# Patient Record
Sex: Male | Born: 1960
Health system: Southern US, Community
[De-identification: ages and names within clinical notes are randomized; demographics above are authoritative.]

## PROBLEM LIST (undated history)

## (undated) DIAGNOSIS — T7840XA Allergy, unspecified, initial encounter: Secondary | ICD-10-CM

## (undated) DIAGNOSIS — Z21 Asymptomatic human immunodeficiency virus [HIV] infection status: Secondary | ICD-10-CM

## (undated) DIAGNOSIS — J939 Pneumothorax, unspecified: Secondary | ICD-10-CM

## (undated) DIAGNOSIS — I10 Essential (primary) hypertension: Secondary | ICD-10-CM

## (undated) DIAGNOSIS — B20 Human immunodeficiency virus [HIV] disease: Secondary | ICD-10-CM

## (undated) HISTORY — DX: Allergy, unspecified, initial encounter: T78.40XA

## (undated) HISTORY — DX: Essential (primary) hypertension: I10

## (undated) HISTORY — PX: CHEST TUBE INSERTION: SHX231

---

## 1998-09-20 ENCOUNTER — Emergency Department (HOSPITAL_COMMUNITY): Admission: EM | Admit: 1998-09-20 | Discharge: 1998-09-20 | Payer: Self-pay | Admitting: Emergency Medicine

## 2001-04-22 ENCOUNTER — Emergency Department (HOSPITAL_COMMUNITY): Admission: EM | Admit: 2001-04-22 | Discharge: 2001-04-22 | Payer: Self-pay | Admitting: Emergency Medicine

## 2001-04-22 ENCOUNTER — Encounter: Payer: Self-pay | Admitting: Emergency Medicine

## 2001-04-24 ENCOUNTER — Emergency Department (HOSPITAL_COMMUNITY): Admission: EM | Admit: 2001-04-24 | Discharge: 2001-04-24 | Payer: Self-pay | Admitting: Emergency Medicine

## 2001-09-17 ENCOUNTER — Encounter: Payer: Self-pay | Admitting: Internal Medicine

## 2001-09-17 LAB — CONVERTED CEMR LAB
HCV Ab: POSITIVE
Hep A Total Ab: NEGATIVE

## 2003-08-14 ENCOUNTER — Emergency Department (HOSPITAL_COMMUNITY): Admission: EM | Admit: 2003-08-14 | Discharge: 2003-08-14 | Payer: Self-pay | Admitting: Emergency Medicine

## 2003-11-19 ENCOUNTER — Ambulatory Visit: Payer: Self-pay | Admitting: Internal Medicine

## 2003-11-19 ENCOUNTER — Ambulatory Visit: Payer: Self-pay | Admitting: Infectious Diseases

## 2003-11-19 ENCOUNTER — Inpatient Hospital Stay (HOSPITAL_COMMUNITY): Admission: EM | Admit: 2003-11-19 | Discharge: 2003-12-16 | Payer: Self-pay | Admitting: Emergency Medicine

## 2004-01-05 ENCOUNTER — Ambulatory Visit: Payer: Self-pay | Admitting: Internal Medicine

## 2004-01-28 ENCOUNTER — Ambulatory Visit: Payer: Self-pay | Admitting: Internal Medicine

## 2004-03-19 ENCOUNTER — Inpatient Hospital Stay (HOSPITAL_COMMUNITY): Admission: EM | Admit: 2004-03-19 | Discharge: 2004-03-22 | Payer: Self-pay | Admitting: Emergency Medicine

## 2004-03-19 ENCOUNTER — Ambulatory Visit: Payer: Self-pay | Admitting: Internal Medicine

## 2004-07-20 ENCOUNTER — Emergency Department (HOSPITAL_COMMUNITY): Admission: EM | Admit: 2004-07-20 | Discharge: 2004-07-20 | Payer: Self-pay | Admitting: Emergency Medicine

## 2004-08-02 ENCOUNTER — Ambulatory Visit: Payer: Self-pay | Admitting: Internal Medicine

## 2004-09-02 ENCOUNTER — Ambulatory Visit: Payer: Self-pay | Admitting: Internal Medicine

## 2004-09-04 ENCOUNTER — Emergency Department (HOSPITAL_COMMUNITY): Admission: EM | Admit: 2004-09-04 | Discharge: 2004-09-04 | Payer: Self-pay | Admitting: *Deleted

## 2004-09-10 ENCOUNTER — Ambulatory Visit: Payer: Self-pay | Admitting: Internal Medicine

## 2004-11-04 ENCOUNTER — Ambulatory Visit: Payer: Self-pay | Admitting: Internal Medicine

## 2004-11-15 ENCOUNTER — Ambulatory Visit: Payer: Self-pay | Admitting: Internal Medicine

## 2004-11-24 ENCOUNTER — Ambulatory Visit: Payer: Self-pay | Admitting: Family Medicine

## 2004-12-02 ENCOUNTER — Ambulatory Visit: Payer: Self-pay | Admitting: Internal Medicine

## 2004-12-16 ENCOUNTER — Ambulatory Visit: Payer: Self-pay | Admitting: Internal Medicine

## 2005-01-06 ENCOUNTER — Ambulatory Visit (HOSPITAL_COMMUNITY): Admission: RE | Admit: 2005-01-06 | Discharge: 2005-01-06 | Payer: Self-pay | Admitting: Internal Medicine

## 2005-01-06 ENCOUNTER — Ambulatory Visit: Payer: Self-pay | Admitting: Internal Medicine

## 2005-01-07 ENCOUNTER — Ambulatory Visit: Payer: Self-pay | Admitting: Internal Medicine

## 2005-01-20 ENCOUNTER — Ambulatory Visit: Payer: Self-pay | Admitting: Internal Medicine

## 2005-02-03 ENCOUNTER — Ambulatory Visit: Payer: Self-pay | Admitting: Internal Medicine

## 2005-03-03 ENCOUNTER — Ambulatory Visit: Payer: Self-pay | Admitting: Internal Medicine

## 2005-03-09 ENCOUNTER — Encounter: Payer: Self-pay | Admitting: Internal Medicine

## 2005-03-09 LAB — CONVERTED CEMR LAB: Hep B S Ab: POSITIVE

## 2005-03-16 ENCOUNTER — Ambulatory Visit: Payer: Self-pay | Admitting: Internal Medicine

## 2005-05-12 ENCOUNTER — Ambulatory Visit: Payer: Self-pay | Admitting: Internal Medicine

## 2005-08-11 ENCOUNTER — Ambulatory Visit: Payer: Self-pay | Admitting: Internal Medicine

## 2005-09-13 ENCOUNTER — Ambulatory Visit: Payer: Self-pay | Admitting: Internal Medicine

## 2005-09-15 ENCOUNTER — Ambulatory Visit: Payer: Self-pay | Admitting: Internal Medicine

## 2005-10-13 ENCOUNTER — Ambulatory Visit: Payer: Self-pay | Admitting: Internal Medicine

## 2005-11-03 ENCOUNTER — Ambulatory Visit: Payer: Self-pay | Admitting: Internal Medicine

## 2005-12-01 ENCOUNTER — Ambulatory Visit: Payer: Self-pay | Admitting: Internal Medicine

## 2006-01-28 ENCOUNTER — Emergency Department (HOSPITAL_COMMUNITY): Admission: EM | Admit: 2006-01-28 | Discharge: 2006-01-28 | Payer: Self-pay | Admitting: Emergency Medicine

## 2006-02-02 ENCOUNTER — Ambulatory Visit: Payer: Self-pay | Admitting: Internal Medicine

## 2006-04-27 ENCOUNTER — Ambulatory Visit: Payer: Self-pay | Admitting: Internal Medicine

## 2006-05-27 ENCOUNTER — Emergency Department (HOSPITAL_COMMUNITY): Admission: EM | Admit: 2006-05-27 | Discharge: 2006-05-27 | Payer: Self-pay | Admitting: Emergency Medicine

## 2006-07-10 DIAGNOSIS — B20 Human immunodeficiency virus [HIV] disease: Secondary | ICD-10-CM | POA: Insufficient documentation

## 2006-07-10 DIAGNOSIS — M545 Low back pain, unspecified: Secondary | ICD-10-CM | POA: Insufficient documentation

## 2006-07-10 DIAGNOSIS — B59 Pneumocystosis: Secondary | ICD-10-CM

## 2006-07-10 DIAGNOSIS — B182 Chronic viral hepatitis C: Secondary | ICD-10-CM | POA: Insufficient documentation

## 2006-07-10 DIAGNOSIS — J309 Allergic rhinitis, unspecified: Secondary | ICD-10-CM | POA: Insufficient documentation

## 2006-07-10 DIAGNOSIS — F1021 Alcohol dependence, in remission: Secondary | ICD-10-CM

## 2006-07-10 DIAGNOSIS — F1020 Alcohol dependence, uncomplicated: Secondary | ICD-10-CM | POA: Insufficient documentation

## 2006-07-10 HISTORY — DX: Chronic viral hepatitis C: B18.2

## 2006-07-10 HISTORY — DX: Pneumocystosis: B59

## 2006-07-24 ENCOUNTER — Emergency Department (HOSPITAL_COMMUNITY): Admission: EM | Admit: 2006-07-24 | Discharge: 2006-07-24 | Payer: Self-pay | Admitting: Emergency Medicine

## 2006-07-31 ENCOUNTER — Ambulatory Visit: Payer: Self-pay | Admitting: Internal Medicine

## 2006-08-17 ENCOUNTER — Ambulatory Visit: Payer: Self-pay | Admitting: Internal Medicine

## 2006-08-21 ENCOUNTER — Other Ambulatory Visit: Payer: Self-pay | Admitting: Emergency Medicine

## 2006-08-21 ENCOUNTER — Ambulatory Visit: Payer: Self-pay | Admitting: Psychiatry

## 2006-08-21 ENCOUNTER — Inpatient Hospital Stay (HOSPITAL_COMMUNITY): Admission: RE | Admit: 2006-08-21 | Discharge: 2006-08-28 | Payer: Self-pay | Admitting: Psychiatry

## 2006-10-30 ENCOUNTER — Ambulatory Visit: Payer: Self-pay | Admitting: Internal Medicine

## 2006-10-30 LAB — CONVERTED CEMR LAB
ALT: 107 units/L — ABNORMAL HIGH (ref 0–53)
AST: 60 units/L — ABNORMAL HIGH (ref 0–37)
Albumin: 4.2 g/dL (ref 3.5–5.2)
Alkaline Phosphatase: 72 units/L (ref 39–117)
BUN: 11 mg/dL (ref 6–23)
Basophils Absolute: 0 10*3/uL (ref 0.0–0.1)
Basophils Relative: 0 % (ref 0–1)
CO2: 26 meq/L (ref 19–32)
Calcium: 8.6 mg/dL (ref 8.4–10.5)
Chloride: 107 meq/L (ref 96–112)
Creatinine, Ser: 0.85 mg/dL (ref 0.40–1.50)
Eosinophils Absolute: 0.2 10*3/uL (ref 0.0–0.7)
Eosinophils Relative: 3 % (ref 0–5)
Glucose, Bld: 54 mg/dL — ABNORMAL LOW (ref 70–99)
HCT: 42.3 % (ref 39.0–52.0)
HIV 1 RNA Quant: <50 copies/mL (ref <50)
HIV-1 RNA Quant, Log: <1.7 (ref <1.70)
Hemoglobin: 13.7 g/dL (ref 13.0–17.0)
Lymphocytes Relative: 28 % (ref 12–46)
Lymphs Abs: 1.7 10*3/uL (ref 0.7–3.3)
MCHC: 32.4 g/dL (ref 30.0–36.0)
MCV: 90.2 fL (ref 78.0–100.0)
Monocytes Absolute: 0.6 10*3/uL (ref 0.2–0.7)
Monocytes Relative: 10 % (ref 3–11)
Neutro Abs: 3.7 10*3/uL (ref 1.7–7.7)
Neutrophils Relative %: 60 % (ref 43–77)
Platelets: 259 10*3/uL (ref 150–400)
Potassium: 4.1 meq/L (ref 3.5–5.3)
RBC: 4.69 M/uL (ref 4.22–5.81)
RDW: 14.8 % — ABNORMAL HIGH (ref 11.5–14.0)
Sodium: 141 meq/L (ref 135–145)
Total Bilirubin: 0.4 mg/dL (ref 0.3–1.2)
Total Protein: 7.8 g/dL (ref 6.0–8.3)
WBC: 6.1 10*3/uL (ref 4.0–10.5)

## 2006-11-16 ENCOUNTER — Emergency Department (HOSPITAL_COMMUNITY): Admission: EM | Admit: 2006-11-16 | Discharge: 2006-11-16 | Payer: Self-pay | Admitting: Emergency Medicine

## 2006-11-28 ENCOUNTER — Ambulatory Visit: Payer: Self-pay | Admitting: Internal Medicine

## 2006-11-30 ENCOUNTER — Ambulatory Visit: Payer: Self-pay | Admitting: Internal Medicine

## 2006-12-21 ENCOUNTER — Ambulatory Visit: Payer: Self-pay | Admitting: Internal Medicine

## 2007-02-15 ENCOUNTER — Ambulatory Visit: Payer: Self-pay | Admitting: Internal Medicine

## 2007-02-15 LAB — CONVERTED CEMR LAB
ALT: 63 units/L — ABNORMAL HIGH (ref 0–53)
AST: 60 units/L — ABNORMAL HIGH (ref 0–37)
Absolute CD4: 356 #/uL — ABNORMAL LOW (ref 381–1469)
Albumin: 4.2 g/dL (ref 3.5–5.2)
Alkaline Phosphatase: 75 units/L (ref 39–117)
BUN: 9 mg/dL (ref 6–23)
Basophils Absolute: 0.1 10*3/uL (ref 0.0–0.1)
Basophils Relative: 1 % (ref 0–1)
CD4 T Helper %: 21 % — ABNORMAL LOW (ref 32–62)
CO2: 25 meq/L (ref 19–32)
Calcium: 9 mg/dL (ref 8.4–10.5)
Chloride: 106 meq/L (ref 96–112)
Creatinine, Ser: 0.92 mg/dL (ref 0.40–1.50)
Eosinophils Absolute: 0.1 10*3/uL (ref 0.0–0.7)
Eosinophils Relative: 1 % (ref 0–5)
Glucose, Bld: 66 mg/dL — ABNORMAL LOW (ref 70–99)
HCT: 40.1 % (ref 39.0–52.0)
HIV 1 RNA Quant: 58 copies/mL — ABNORMAL HIGH (ref <50)
HIV-1 RNA Quant, Log: 1.76 — ABNORMAL HIGH (ref <1.70)
Hemoglobin: 13 g/dL (ref 13.0–17.0)
Lymphocytes Relative: 32 % (ref 12–46)
Lymphs Abs: 1.7 10*3/uL (ref 0.7–4.0)
MCHC: 32.4 g/dL (ref 30.0–36.0)
MCV: 89.5 fL (ref 78.0–100.0)
Monocytes Absolute: 0.6 10*3/uL (ref 0.1–1.0)
Monocytes Relative: 11 % (ref 3–12)
Neutro Abs: 2.9 10*3/uL (ref 1.7–7.7)
Neutrophils Relative %: 55 % (ref 43–77)
Platelets: 281 10*3/uL (ref 150–400)
Potassium: 4.4 meq/L (ref 3.5–5.3)
RBC: 4.48 M/uL (ref 4.22–5.81)
RDW: 15 % (ref 11.5–15.5)
Sodium: 144 meq/L (ref 135–145)
Total Bilirubin: 0.6 mg/dL (ref 0.3–1.2)
Total Lymphocytes %: 32 % (ref 12–46)
Total Protein: 8.1 g/dL (ref 6.0–8.3)
Total lymphocyte count: 1696 cells/mcL (ref 700–3300)
WBC, lymph enumeration: 5.3 10*3/uL (ref 4.0–10.5)
WBC: 5.3 10*3/uL (ref 4.0–10.5)

## 2007-03-01 ENCOUNTER — Ambulatory Visit: Payer: Self-pay | Admitting: Internal Medicine

## 2007-03-29 ENCOUNTER — Ambulatory Visit: Payer: Self-pay | Admitting: Internal Medicine

## 2007-06-28 ENCOUNTER — Ambulatory Visit: Payer: Self-pay | Admitting: Internal Medicine

## 2007-07-30 ENCOUNTER — Ambulatory Visit: Payer: Self-pay | Admitting: Internal Medicine

## 2007-07-31 ENCOUNTER — Encounter: Payer: Self-pay | Admitting: Internal Medicine

## 2007-07-31 LAB — CONVERTED CEMR LAB
HIV 1 RNA Quant: 5670 copies/mL — ABNORMAL HIGH (ref <50)
HIV-1 RNA Quant, Log: 3.75 — ABNORMAL HIGH (ref <1.70)

## 2007-08-23 ENCOUNTER — Ambulatory Visit: Payer: Self-pay | Admitting: Internal Medicine

## 2007-09-24 ENCOUNTER — Ambulatory Visit: Payer: Self-pay | Admitting: Internal Medicine

## 2007-09-24 ENCOUNTER — Encounter: Payer: Self-pay | Admitting: Internal Medicine

## 2007-09-24 LAB — CONVERTED CEMR LAB
ALT: 56 units/L — ABNORMAL HIGH (ref 0–53)
AST: 97 units/L — ABNORMAL HIGH (ref 0–37)
Absolute CD4: 154 #/uL — ABNORMAL LOW (ref 381–1469)
Albumin: 3.9 g/dL (ref 3.5–5.2)
Alkaline Phosphatase: 66 units/L (ref 39–117)
BUN: 9 mg/dL (ref 6–23)
Basophils Absolute: 0.1 10*3/uL (ref 0.0–0.1)
Basophils Relative: 1 % (ref 0–1)
CD4 T Helper %: 14 % — ABNORMAL LOW (ref 32–62)
CO2: 26 meq/L (ref 19–32)
Calcium: 9.2 mg/dL (ref 8.4–10.5)
Chloride: 102 meq/L (ref 96–112)
Creatinine, Ser: 0.89 mg/dL (ref 0.40–1.50)
Eosinophils Absolute: 0.3 10*3/uL (ref 0.0–0.7)
Eosinophils Relative: 7 % — ABNORMAL HIGH (ref 0–5)
Glucose, Bld: 92 mg/dL (ref 70–99)
HCT: 40.2 % (ref 39.0–52.0)
HIV 1 RNA Quant: 745 copies/mL — ABNORMAL HIGH (ref <50)
HIV-1 RNA Quant, Log: 2.87 — ABNORMAL HIGH (ref <1.70)
Hemoglobin: 13.1 g/dL (ref 13.0–17.0)
Lymphocytes Relative: 25 % (ref 12–46)
Lymphs Abs: 1.1 10*3/uL (ref 0.7–4.0)
MCHC: 32.6 g/dL (ref 30.0–36.0)
MCV: 85.9 fL (ref 78.0–100.0)
Monocytes Absolute: 0.8 10*3/uL (ref 0.1–1.0)
Monocytes Relative: 17 % — ABNORMAL HIGH (ref 3–12)
Neutro Abs: 2.2 10*3/uL (ref 1.7–7.7)
Neutrophils Relative %: 50 % (ref 43–77)
Platelets: 249 10*3/uL (ref 150–400)
Potassium: 3.9 meq/L (ref 3.5–5.3)
RBC: 4.68 M/uL (ref 4.22–5.81)
RDW: 15.3 % (ref 11.5–15.5)
Sodium: 137 meq/L (ref 135–145)
Total Bilirubin: 0.6 mg/dL (ref 0.3–1.2)
Total Lymphocytes %: 25 % (ref 12–46)
Total Protein: 8.1 g/dL (ref 6.0–8.3)
Total lymphocyte count: 1100 cells/mcL (ref 700–3300)
WBC, lymph enumeration: 4.4 10*3/uL (ref 4.0–10.5)
WBC: 4.4 10*3/uL (ref 4.0–10.5)

## 2007-10-25 ENCOUNTER — Ambulatory Visit: Payer: Self-pay | Admitting: Internal Medicine

## 2007-11-09 ENCOUNTER — Emergency Department (HOSPITAL_COMMUNITY): Admission: EM | Admit: 2007-11-09 | Discharge: 2007-11-10 | Payer: Self-pay | Admitting: *Deleted

## 2007-11-22 ENCOUNTER — Ambulatory Visit: Payer: Self-pay | Admitting: Internal Medicine

## 2007-12-20 ENCOUNTER — Ambulatory Visit: Payer: Self-pay | Admitting: Internal Medicine

## 2007-12-20 LAB — CONVERTED CEMR LAB
ALT: 67 units/L — ABNORMAL HIGH (ref 0–53)
AST: 102 units/L — ABNORMAL HIGH (ref 0–37)
Absolute CD4: 103 #/uL — ABNORMAL LOW (ref 381–1469)
Albumin: 3.7 g/dL (ref 3.5–5.2)
Alkaline Phosphatase: 61 units/L (ref 39–117)
BUN: 13 mg/dL (ref 6–23)
Basophils Absolute: 0 10*3/uL (ref 0.0–0.1)
Basophils Relative: 1 % (ref 0–1)
CD4 T Helper %: 8 % — ABNORMAL LOW (ref 32–62)
CO2: 24 meq/L (ref 19–32)
Calcium: 8.6 mg/dL (ref 8.4–10.5)
Chloride: 108 meq/L (ref 96–112)
Creatinine, Ser: 0.85 mg/dL (ref 0.40–1.50)
Eosinophils Absolute: 0.1 10*3/uL (ref 0.0–0.7)
Eosinophils Relative: 3 % (ref 0–5)
Glucose, Bld: 80 mg/dL (ref 70–99)
HCT: 40.7 % (ref 39.0–52.0)
HIV 1 RNA Quant: 7630 copies/mL — ABNORMAL HIGH (ref <48)
HIV-1 RNA Quant, Log: 3.88 — ABNORMAL HIGH (ref <1.68)
Hemoglobin: 13.2 g/dL (ref 13.0–17.0)
Lymphocytes Relative: 33 % (ref 12–46)
Lymphs Abs: 1.3 10*3/uL (ref 0.7–4.0)
MCHC: 32.4 g/dL (ref 30.0–36.0)
MCV: 85.1 fL (ref 78.0–100.0)
Monocytes Absolute: 0.6 10*3/uL (ref 0.1–1.0)
Monocytes Relative: 16 % — ABNORMAL HIGH (ref 3–12)
Neutro Abs: 1.9 10*3/uL (ref 1.7–7.7)
Neutrophils Relative %: 48 % (ref 43–77)
Platelets: 193 10*3/uL (ref 150–400)
Potassium: 3.9 meq/L (ref 3.5–5.3)
RBC: 4.78 M/uL (ref 4.22–5.81)
RDW: 14.4 % (ref 11.5–15.5)
Sodium: 142 meq/L (ref 135–145)
Total Bilirubin: 0.6 mg/dL (ref 0.3–1.2)
Total Lymphocytes %: 33 % (ref 12–46)
Total Protein: 7.4 g/dL (ref 6.0–8.3)
Total lymphocyte count: 1287 cells/mcL (ref 700–3300)
WBC, lymph enumeration: 3.9 10*3/uL — ABNORMAL LOW (ref 4.0–10.5)
WBC: 3.9 10*3/uL — ABNORMAL LOW (ref 4.0–10.5)

## 2008-01-17 ENCOUNTER — Ambulatory Visit: Payer: Self-pay | Admitting: Internal Medicine

## 2008-01-17 LAB — CONVERTED CEMR LAB
HIV 1 RNA Quant: 1030 copies/mL — ABNORMAL HIGH (ref <48)
HIV-1 RNA Quant, Log: 3.01 — ABNORMAL HIGH (ref <1.68)

## 2008-02-04 ENCOUNTER — Ambulatory Visit: Payer: Self-pay | Admitting: Internal Medicine

## 2008-05-08 ENCOUNTER — Ambulatory Visit: Payer: Self-pay | Admitting: Internal Medicine

## 2008-05-08 ENCOUNTER — Encounter: Payer: Self-pay | Admitting: Internal Medicine

## 2008-05-08 LAB — CONVERTED CEMR LAB
ALT: 117 units/L — ABNORMAL HIGH (ref 0–53)
AST: 97 units/L — ABNORMAL HIGH (ref 0–37)
Absolute CD4: 200 #/uL — ABNORMAL LOW (ref 381–1469)
Albumin: 4.1 g/dL (ref 3.5–5.2)
Alkaline Phosphatase: 72 units/L (ref 39–117)
BUN: 17 mg/dL (ref 6–23)
Basophils Absolute: 0.1 10*3/uL (ref 0.0–0.1)
Basophils Relative: 1 % (ref 0–1)
CD4 Count: 200 microliters
CD4 T Helper %: 13 % — ABNORMAL LOW (ref 32–62)
CO2: 24 meq/L (ref 19–32)
Calcium: 8.7 mg/dL (ref 8.4–10.5)
Chloride: 106 meq/L (ref 96–112)
Cholesterol: 160 mg/dL (ref 0–200)
Creatinine, Ser: 0.82 mg/dL (ref 0.40–1.50)
Eosinophils Absolute: 0 10*3/uL (ref 0.0–0.7)
Eosinophils Relative: 1 % (ref 0–5)
Glucose, Bld: 73 mg/dL (ref 70–99)
HCT: 40.8 % (ref 39.0–52.0)
HDL: 50 mg/dL (ref >39)
HIV 1 RNA Quant: 3920 copies/mL — ABNORMAL HIGH (ref <48)
HIV-1 RNA Quant, Log: 3.59 — ABNORMAL HIGH (ref <1.68)
Hemoglobin: 13.3 g/dL (ref 13.0–17.0)
LDL Cholesterol: 93 mg/dL (ref 0–99)
Lymphocytes Relative: 32 % (ref 12–46)
Lymphs Abs: 1.6 10*3/uL (ref 0.7–4.0)
MCHC: 32.6 g/dL (ref 30.0–36.0)
MCV: 87.2 fL (ref 78.0–100.0)
Monocytes Absolute: 0.8 10*3/uL (ref 0.1–1.0)
Monocytes Relative: 17 % — ABNORMAL HIGH (ref 3–12)
Neutro Abs: 2.4 10*3/uL (ref 1.7–7.7)
Neutrophils Relative %: 49 % (ref 43–77)
Platelets: 227 10*3/uL (ref 150–400)
Potassium: 4.1 meq/L (ref 3.5–5.3)
RBC: 4.68 M/uL (ref 4.22–5.81)
RDW: 14.4 % (ref 11.5–15.5)
Sodium: 142 meq/L (ref 135–145)
Total Bilirubin: 0.6 mg/dL (ref 0.3–1.2)
Total CHOL/HDL Ratio: 3.2
Total Lymphocytes %: 32 % (ref 12–46)
Total Protein: 8.4 g/dL — ABNORMAL HIGH (ref 6.0–8.3)
Total lymphocyte count: 1536 cells/mcL (ref 700–3300)
Triglycerides: 85 mg/dL (ref <150)
VLDL: 17 mg/dL (ref 0–40)
WBC, lymph enumeration: 4.8 10*3/uL (ref 4.0–10.5)
WBC: 4.8 10*3/uL (ref 4.0–10.5)

## 2008-05-22 ENCOUNTER — Encounter: Payer: Self-pay | Admitting: Internal Medicine

## 2008-05-22 ENCOUNTER — Ambulatory Visit: Payer: Self-pay | Admitting: Internal Medicine

## 2008-06-24 ENCOUNTER — Encounter: Payer: Self-pay | Admitting: Internal Medicine

## 2008-07-14 ENCOUNTER — Encounter: Payer: Self-pay | Admitting: Internal Medicine

## 2008-07-14 ENCOUNTER — Ambulatory Visit: Payer: Self-pay | Admitting: Internal Medicine

## 2008-07-14 LAB — CONVERTED CEMR LAB
ALT: 97 units/L — ABNORMAL HIGH (ref 0–53)
AST: 97 units/L — ABNORMAL HIGH (ref 0–37)
Absolute CD4: 135 #/uL — ABNORMAL LOW (ref 381–1469)
Albumin: 4.3 g/dL (ref 3.5–5.2)
Alkaline Phosphatase: 67 units/L (ref 39–117)
BUN: 9 mg/dL (ref 6–23)
Basophils Absolute: 0.1 10*3/uL (ref 0.0–0.1)
Basophils Relative: 1 % (ref 0–1)
CD4 T Helper %: 10 % — ABNORMAL LOW (ref 32–62)
CO2: 25 meq/L (ref 19–32)
Calcium: 9.6 mg/dL (ref 8.4–10.5)
Chloride: 105 meq/L (ref 96–112)
Creatinine, Ser: 0.88 mg/dL (ref 0.40–1.50)
Eosinophils Absolute: 0.1 10*3/uL (ref 0.0–0.7)
Eosinophils Relative: 2 % (ref 0–5)
Glucose, Bld: 114 mg/dL — ABNORMAL HIGH (ref 70–99)
HCT: 40 % (ref 39.0–52.0)
HIV 1 RNA Quant: 177 copies/mL — ABNORMAL HIGH (ref <48)
HIV-1 RNA Quant, Log: 2.25 — ABNORMAL HIGH (ref <1.68)
Hemoglobin: 13.4 g/dL (ref 13.0–17.0)
Lymphocytes Relative: 27 % (ref 12–46)
Lymphs Abs: 1.4 10*3/uL (ref 0.7–4.0)
MCHC: 33.5 g/dL (ref 30.0–36.0)
MCV: 86.2 fL (ref 78.0–100.0)
Monocytes Absolute: 0.6 10*3/uL (ref 0.1–1.0)
Monocytes Relative: 13 % — ABNORMAL HIGH (ref 3–12)
Neutro Abs: 2.8 10*3/uL (ref 1.7–7.7)
Neutrophils Relative %: 57 % (ref 43–77)
Platelets: 244 10*3/uL (ref 150–400)
Potassium: 4.6 meq/L (ref 3.5–5.3)
RBC: 4.64 M/uL (ref 4.22–5.81)
RDW: 15.3 % (ref 11.5–15.5)
Sodium: 143 meq/L (ref 135–145)
Total Bilirubin: 0.4 mg/dL (ref 0.3–1.2)
Total Lymphocytes %: 27 % (ref 12–46)
Total Protein: 8.8 g/dL — ABNORMAL HIGH (ref 6.0–8.3)
Total lymphocyte count: 1350 cells/mcL (ref 700–3300)
WBC, lymph enumeration: 5 10*3/uL (ref 4.0–10.5)
WBC: 5 10*3/uL (ref 4.0–10.5)

## 2008-07-28 ENCOUNTER — Encounter: Payer: Self-pay | Admitting: Internal Medicine

## 2008-07-28 ENCOUNTER — Ambulatory Visit: Payer: Self-pay | Admitting: Internal Medicine

## 2008-08-20 ENCOUNTER — Emergency Department (HOSPITAL_COMMUNITY): Admission: EM | Admit: 2008-08-20 | Discharge: 2008-08-20 | Payer: Self-pay | Admitting: Emergency Medicine

## 2008-09-30 ENCOUNTER — Ambulatory Visit: Payer: Self-pay | Admitting: Thoracic Surgery

## 2008-09-30 ENCOUNTER — Inpatient Hospital Stay (HOSPITAL_COMMUNITY): Admission: EM | Admit: 2008-09-30 | Discharge: 2008-10-13 | Payer: Self-pay | Admitting: Emergency Medicine

## 2008-10-01 ENCOUNTER — Ambulatory Visit: Payer: Self-pay | Admitting: Internal Medicine

## 2008-10-16 ENCOUNTER — Encounter: Payer: Self-pay | Admitting: Internal Medicine

## 2008-10-16 ENCOUNTER — Ambulatory Visit: Payer: Self-pay | Admitting: Internal Medicine

## 2008-10-16 DIAGNOSIS — J930 Spontaneous tension pneumothorax: Secondary | ICD-10-CM | POA: Insufficient documentation

## 2008-10-16 DIAGNOSIS — J939 Pneumothorax, unspecified: Secondary | ICD-10-CM | POA: Insufficient documentation

## 2008-10-16 LAB — CONVERTED CEMR LAB
ALT: 27 units/L (ref 0–53)
AST: 32 units/L (ref 0–37)
Absolute CD4: 129 #/uL — ABNORMAL LOW (ref 381–1469)
Albumin: 3.6 g/dL (ref 3.5–5.2)
Alkaline Phosphatase: 59 units/L (ref 39–117)
BUN: 13 mg/dL (ref 6–23)
Basophils Absolute: 0.1 10*3/uL (ref 0.0–0.1)
Basophils Relative: 2 % — ABNORMAL HIGH (ref 0–1)
CD4 T Helper %: 10 % — ABNORMAL LOW (ref 32–62)
CO2: 23 meq/L (ref 19–32)
Calcium: 8.5 mg/dL (ref 8.4–10.5)
Chloride: 101 meq/L (ref 96–112)
Creatinine, Ser: 0.78 mg/dL (ref 0.40–1.50)
Eosinophils Absolute: 0.3 10*3/uL (ref 0.0–0.7)
Eosinophils Relative: 5 % (ref 0–5)
Glucose, Bld: 84 mg/dL (ref 70–99)
HCT: 31.5 % — ABNORMAL LOW (ref 39.0–52.0)
HIV 1 RNA Quant: 24000 copies/mL — ABNORMAL HIGH (ref <48)
HIV-1 RNA Quant, Log: 4.38 — ABNORMAL HIGH (ref <1.68)
Hemoglobin: 9.7 g/dL — ABNORMAL LOW (ref 13.0–17.0)
Lymphocytes Relative: 23 % (ref 12–46)
Lymphs Abs: 1.3 10*3/uL (ref 0.7–4.0)
MCHC: 30.8 g/dL (ref 30.0–36.0)
MCV: 83.8 fL (ref 78.0–100.0)
Monocytes Absolute: 1.1 10*3/uL — ABNORMAL HIGH (ref 0.1–1.0)
Monocytes Relative: 19 % — ABNORMAL HIGH (ref 3–12)
Neutro Abs: 2.9 10*3/uL (ref 1.7–7.7)
Neutrophils Relative %: 51 % (ref 43–77)
Platelets: 513 10*3/uL — ABNORMAL HIGH (ref 150–400)
Potassium: 4.5 meq/L (ref 3.5–5.3)
RBC: 3.76 M/uL — ABNORMAL LOW (ref 4.22–5.81)
RDW: 13.9 % (ref 11.5–15.5)
Sodium: 136 meq/L (ref 135–145)
Total Bilirubin: 0.4 mg/dL (ref 0.3–1.2)
Total Protein: 7.7 g/dL (ref 6.0–8.3)
Total lymphocyte count: 1288 cells/mcL (ref 700–3300)
WBC: 5.6 10*3/uL (ref 4.0–10.5)

## 2008-10-20 ENCOUNTER — Encounter: Payer: Self-pay | Admitting: Internal Medicine

## 2008-10-21 ENCOUNTER — Encounter: Payer: Self-pay | Admitting: Internal Medicine

## 2008-10-21 ENCOUNTER — Ambulatory Visit: Payer: Self-pay | Admitting: Surgery

## 2008-10-21 ENCOUNTER — Encounter: Admission: RE | Admit: 2008-10-21 | Discharge: 2008-10-21 | Payer: Self-pay | Admitting: Thoracic Surgery

## 2008-10-21 LAB — CONVERTED CEMR LAB

## 2008-10-30 ENCOUNTER — Encounter: Payer: Self-pay | Admitting: Internal Medicine

## 2008-10-30 ENCOUNTER — Ambulatory Visit: Payer: Self-pay | Admitting: Internal Medicine

## 2008-11-13 ENCOUNTER — Encounter: Payer: Self-pay | Admitting: Internal Medicine

## 2008-11-13 ENCOUNTER — Ambulatory Visit: Payer: Self-pay | Admitting: Internal Medicine

## 2008-11-17 ENCOUNTER — Telehealth: Payer: Self-pay | Admitting: Internal Medicine

## 2008-12-15 ENCOUNTER — Ambulatory Visit: Payer: Self-pay | Admitting: Surgery

## 2008-12-29 ENCOUNTER — Encounter: Payer: Self-pay | Admitting: Internal Medicine

## 2008-12-29 ENCOUNTER — Ambulatory Visit: Payer: Self-pay | Admitting: Internal Medicine

## 2008-12-29 LAB — CONVERTED CEMR LAB
ALT: 70 units/L — ABNORMAL HIGH (ref 0–53)
AST: 52 units/L — ABNORMAL HIGH (ref 0–37)
Absolute CD4: 295 #/uL — ABNORMAL LOW (ref 381–1469)
Albumin: 4.4 g/dL (ref 3.5–5.2)
Alkaline Phosphatase: 67 units/L (ref 39–117)
BUN: 16 mg/dL (ref 6–23)
Basophils Absolute: 0.1 10*3/uL (ref 0.0–0.1)
Basophils Relative: 1 % (ref 0–1)
CD4 T Helper %: 11 % — ABNORMAL LOW (ref 32–62)
CO2: 22 meq/L (ref 19–32)
Calcium: 9.4 mg/dL (ref 8.4–10.5)
Chloride: 107 meq/L (ref 96–112)
Creatinine, Ser: 0.82 mg/dL (ref 0.40–1.50)
Eosinophils Absolute: 0.4 10*3/uL (ref 0.0–0.7)
Eosinophils Relative: 7 % — ABNORMAL HIGH (ref 0–5)
Glucose, Bld: 84 mg/dL (ref 70–99)
HCT: 36.4 % — ABNORMAL LOW (ref 39.0–52.0)
HIV 1 RNA Quant: <48 copies/mL (ref <48)
HIV-1 RNA Quant, Log: <1.68 (ref <1.68)
Hemoglobin: 11.4 g/dL — ABNORMAL LOW (ref 13.0–17.0)
Lymphocytes Relative: 47 % — ABNORMAL HIGH (ref 12–46)
Lymphs Abs: 2.7 10*3/uL (ref 0.7–4.0)
MCHC: 31.3 g/dL (ref 30.0–36.0)
MCV: 79.6 fL (ref 78.0–100.0)
Monocytes Absolute: 0.6 10*3/uL (ref 0.1–1.0)
Monocytes Relative: 11 % (ref 3–12)
Neutro Abs: 2 10*3/uL (ref 1.7–7.7)
Neutrophils Relative %: 35 % — ABNORMAL LOW (ref 43–77)
Platelets: 290 10*3/uL (ref 150–400)
Potassium: 4.6 meq/L (ref 3.5–5.3)
RBC: 4.57 M/uL (ref 4.22–5.81)
RDW: 16.7 % — ABNORMAL HIGH (ref 11.5–15.5)
Sodium: 140 meq/L (ref 135–145)
Total Bilirubin: 0.2 mg/dL — ABNORMAL LOW (ref 0.3–1.2)
Total Protein: 8.4 g/dL — ABNORMAL HIGH (ref 6.0–8.3)
Total lymphocyte count: 2679 cells/mcL (ref 700–3300)
WBC: 5.7 10*3/uL (ref 4.0–10.5)

## 2009-01-12 ENCOUNTER — Encounter: Payer: Self-pay | Admitting: Internal Medicine

## 2009-01-12 ENCOUNTER — Ambulatory Visit: Payer: Self-pay | Admitting: Internal Medicine

## 2009-02-10 ENCOUNTER — Encounter: Admission: RE | Admit: 2009-02-10 | Discharge: 2009-02-10 | Payer: Self-pay | Admitting: Surgery

## 2009-02-10 ENCOUNTER — Ambulatory Visit: Payer: Self-pay | Admitting: Surgery

## 2009-05-07 ENCOUNTER — Ambulatory Visit: Payer: Self-pay | Admitting: Internal Medicine

## 2009-05-07 ENCOUNTER — Encounter: Payer: Self-pay | Admitting: Internal Medicine

## 2009-05-07 LAB — CONVERTED CEMR LAB
ALT: 63 units/L — ABNORMAL HIGH (ref 0–53)
AST: 65 units/L — ABNORMAL HIGH (ref 0–37)
Absolute CD4: 104 #/uL — ABNORMAL LOW (ref 381–1469)
Albumin: 4.3 g/dL (ref 3.5–5.2)
Alkaline Phosphatase: 58 units/L (ref 39–117)
BUN: 10 mg/dL (ref 6–23)
Basophils Absolute: 0 10*3/uL (ref 0.0–0.1)
Basophils Relative: 1 % (ref 0–1)
CD4 T Helper %: 10 % — ABNORMAL LOW (ref 32–62)
CO2: 25 meq/L (ref 19–32)
Calcium: 9 mg/dL (ref 8.4–10.5)
Chloride: 104 meq/L (ref 96–112)
Creatinine, Ser: 0.87 mg/dL (ref 0.40–1.50)
Eosinophils Absolute: 0.1 10*3/uL (ref 0.0–0.7)
Eosinophils Relative: 3 % (ref 0–5)
Glucose, Bld: 87 mg/dL (ref 70–99)
HCT: 41.3 % (ref 39.0–52.0)
HIV 1 RNA Quant: 20400 copies/mL — ABNORMAL HIGH (ref <48)
HIV-1 RNA Quant, Log: 4.31 — ABNORMAL HIGH (ref <1.68)
Hemoglobin: 12.9 g/dL — ABNORMAL LOW (ref 13.0–17.0)
Lymphocytes Relative: 36 % (ref 12–46)
Lymphs Abs: 1 10*3/uL (ref 0.7–4.0)
MCHC: 31.2 g/dL (ref 30.0–36.0)
MCV: 83.1 fL (ref 78.0–100.0)
Monocytes Absolute: 0.5 10*3/uL (ref 0.1–1.0)
Monocytes Relative: 18 % — ABNORMAL HIGH (ref 3–12)
Neutro Abs: 1.2 10*3/uL — ABNORMAL LOW (ref 1.7–7.7)
Neutrophils Relative %: 42 % — ABNORMAL LOW (ref 43–77)
Platelets: 197 10*3/uL (ref 150–400)
Potassium: 4.2 meq/L (ref 3.5–5.3)
RBC: 4.97 M/uL (ref 4.22–5.81)
RDW: 16.2 % — ABNORMAL HIGH (ref 11.5–15.5)
Sodium: 139 meq/L (ref 135–145)
Total Bilirubin: 0.4 mg/dL (ref 0.3–1.2)
Total Protein: 8.5 g/dL — ABNORMAL HIGH (ref 6.0–8.3)
Total lymphocyte count: 1044 cells/mcL (ref 700–3300)
WBC: 2.9 10*3/uL — ABNORMAL LOW (ref 4.0–10.5)

## 2009-06-10 ENCOUNTER — Emergency Department (HOSPITAL_COMMUNITY): Admission: EM | Admit: 2009-06-10 | Discharge: 2009-06-10 | Payer: Self-pay | Admitting: Emergency Medicine

## 2009-06-18 ENCOUNTER — Emergency Department (HOSPITAL_COMMUNITY): Admission: EM | Admit: 2009-06-18 | Discharge: 2009-06-18 | Payer: Self-pay | Admitting: Emergency Medicine

## 2009-06-23 ENCOUNTER — Ambulatory Visit: Payer: Self-pay | Admitting: Internal Medicine

## 2009-09-02 ENCOUNTER — Encounter: Payer: Self-pay | Admitting: Internal Medicine

## 2009-10-15 ENCOUNTER — Ambulatory Visit: Payer: Self-pay | Admitting: Internal Medicine

## 2009-10-15 LAB — CONVERTED CEMR LAB
ALT: 150 units/L — ABNORMAL HIGH (ref 0–53)
AST: 159 units/L — ABNORMAL HIGH (ref 0–37)
Albumin: 3.9 g/dL (ref 3.5–5.2)
Alkaline Phosphatase: 48 units/L (ref 39–117)
BUN: 15 mg/dL (ref 6–23)
Basophils Absolute: 0 10*3/uL (ref 0.0–0.1)
Basophils Relative: 1 % (ref 0–1)
CO2: 27 meq/L (ref 19–32)
Calcium: 8.5 mg/dL (ref 8.4–10.5)
Chloride: 105 meq/L (ref 96–112)
Cholesterol: 135 mg/dL (ref 0–200)
Creatinine, Ser: 0.94 mg/dL (ref 0.40–1.50)
Eosinophils Absolute: 0.3 10*3/uL (ref 0.0–0.7)
Eosinophils Relative: 9 % — ABNORMAL HIGH (ref 0–5)
Glucose, Bld: 70 mg/dL (ref 70–99)
HCT: 39.6 % (ref 39.0–52.0)
HDL: 44 mg/dL (ref >39)
HIV 1 RNA Quant: <20 copies/mL (ref <48)
HIV-1 RNA Quant, Log: <1.3 (ref <1.68)
Hemoglobin: 13 g/dL (ref 13.0–17.0)
Hepatitis B Surface Ag: NEGATIVE
LDL Cholesterol: 58 mg/dL (ref 0–99)
Lymphocytes Relative: 39 % (ref 12–46)
Lymphs Abs: 1.5 10*3/uL (ref 0.7–4.0)
MCHC: 32.8 g/dL (ref 30.0–36.0)
MCV: 85.9 fL (ref 78.0–100.0)
Monocytes Absolute: 0.6 10*3/uL (ref 0.1–1.0)
Monocytes Relative: 14 % — ABNORMAL HIGH (ref 3–12)
Neutro Abs: 1.5 10*3/uL — ABNORMAL LOW (ref 1.7–7.7)
Neutrophils Relative %: 38 % — ABNORMAL LOW (ref 43–77)
Platelets: 280 10*3/uL (ref 150–400)
Potassium: 4.4 meq/L (ref 3.5–5.3)
RBC: 4.61 M/uL (ref 4.22–5.81)
RDW: 15.3 % (ref 11.5–15.5)
Sodium: 140 meq/L (ref 135–145)
Total Bilirubin: 0.3 mg/dL (ref 0.3–1.2)
Total CHOL/HDL Ratio: 3.1
Total Protein: 7 g/dL (ref 6.0–8.3)
Triglycerides: 166 mg/dL — ABNORMAL HIGH (ref <150)
VLDL: 33 mg/dL (ref 0–40)
WBC: 3.9 10*3/uL — ABNORMAL LOW (ref 4.0–10.5)

## 2009-10-28 ENCOUNTER — Ambulatory Visit: Payer: Self-pay | Admitting: Internal Medicine

## 2010-01-21 ENCOUNTER — Ambulatory Visit: Payer: Self-pay | Admitting: Internal Medicine

## 2010-01-21 ENCOUNTER — Encounter: Payer: Self-pay | Admitting: Internal Medicine

## 2010-01-21 LAB — CONVERTED CEMR LAB
ALT: 169 units/L — ABNORMAL HIGH (ref 0–53)
AST: 214 units/L — ABNORMAL HIGH (ref 0–37)
Albumin: 4.2 g/dL (ref 3.5–5.2)
Alkaline Phosphatase: 49 units/L (ref 39–117)
BUN: 13 mg/dL (ref 6–23)
Basophils Absolute: 0.1 10*3/uL (ref 0.0–0.1)
Basophils Relative: 2 % — ABNORMAL HIGH (ref 0–1)
CO2: 30 meq/L (ref 19–32)
Calcium: 8.9 mg/dL (ref 8.4–10.5)
Chloride: 105 meq/L (ref 96–112)
Creatinine, Ser: 0.8 mg/dL (ref 0.40–1.50)
Eosinophils Absolute: 0.1 10*3/uL (ref 0.0–0.7)
Eosinophils Relative: 4 % (ref 0–5)
Glucose, Bld: 81 mg/dL (ref 70–99)
HCT: 39.2 % (ref 39.0–52.0)
HIV 1 RNA Quant: <20 copies/mL (ref <20)
HIV-1 RNA Quant, Log: <1.3 (ref <1.30)
Hemoglobin: 13.2 g/dL (ref 13.0–17.0)
Lymphocytes Relative: 42 % (ref 12–46)
Lymphs Abs: 1.6 10*3/uL (ref 0.7–4.0)
MCHC: 33.7 g/dL (ref 30.0–36.0)
MCV: 86.5 fL (ref 78.0–100.0)
Monocytes Absolute: 0.6 10*3/uL (ref 0.1–1.0)
Monocytes Relative: 16 % — ABNORMAL HIGH (ref 3–12)
Neutro Abs: 1.4 10*3/uL — ABNORMAL LOW (ref 1.7–7.7)
Neutrophils Relative %: 36 % — ABNORMAL LOW (ref 43–77)
Platelets: 250 10*3/uL (ref 150–400)
Potassium: 4.6 meq/L (ref 3.5–5.3)
RBC: 4.53 M/uL (ref 4.22–5.81)
RDW: 13.8 % (ref 11.5–15.5)
Sodium: 141 meq/L (ref 135–145)
Total Bilirubin: 0.7 mg/dL (ref 0.3–1.2)
Total Protein: 7.8 g/dL (ref 6.0–8.3)
WBC: 3.8 10*3/uL — ABNORMAL LOW (ref 4.0–10.5)

## 2010-02-16 ENCOUNTER — Ambulatory Visit
Admission: RE | Admit: 2010-02-16 | Discharge: 2010-02-16 | Payer: Self-pay | Source: Home / Self Care | Attending: Internal Medicine | Admitting: Internal Medicine

## 2010-02-16 DIAGNOSIS — R748 Abnormal levels of other serum enzymes: Secondary | ICD-10-CM | POA: Insufficient documentation

## 2010-02-16 LAB — TB SKIN TEST
Induration: 0
TB Skin Test: NEGATIVE mm

## 2010-02-18 ENCOUNTER — Ambulatory Visit (HOSPITAL_COMMUNITY)
Admission: RE | Admit: 2010-02-18 | Discharge: 2010-02-18 | Payer: Self-pay | Source: Home / Self Care | Attending: Internal Medicine | Admitting: Internal Medicine

## 2010-02-19 ENCOUNTER — Encounter (INDEPENDENT_AMBULATORY_CARE_PROVIDER_SITE_OTHER): Payer: Self-pay | Admitting: *Deleted

## 2010-02-19 LAB — TB SKIN TEST: TB Skin Test: NEGATIVE mm

## 2010-02-28 ENCOUNTER — Encounter: Payer: Self-pay | Admitting: Surgery

## 2010-02-28 ENCOUNTER — Encounter: Payer: Self-pay | Admitting: Internal Medicine

## 2010-03-01 ENCOUNTER — Encounter: Payer: Self-pay | Admitting: Thoracic Surgery

## 2010-03-09 NOTE — Miscellaneous (Signed)
Summary: Office Visit ([facility])    Vital Signs:  Patient profile:   50 year old male Weight:      154 pounds Temp:     97.5 degrees F oral Pulse rate:   58 / minute Pulse rhythm:   regular Resp:     18 per minute BP sitting:   119 / 80  (left arm)  Vitals Entered By: Sharen Heck RN (May 07, 2009 9:40 AM) CC: f/u 05/ reports rash on right arm and left thigh for a few months that itches Is Patient Diabetic? No Pain Assessment Patient in pain? no       Does patient need assistance? Functional Status Self care Ambulation Normal   CC:  f/u 05/ reports rash on right arm and left thigh for a few months that itches.  History of Present Illness: Pt c/o pruritic rash on his right arm. He states that he has been taking his meds every day.  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 Take 1 Tablet By Mouth Once A Day 2)  Bactrim Ds 800-160 Mg Tabs (Sulfamethoxazole-Trimethoprim) .... Take 1 Tablet By Mouth Once A Day 3)  Ensure  Liqd (Nutritional Supplements) .... One Can Two Times A Day 4)  Isentress 400 Mg Tabs (Raltegravir Potassium) .... Take 1 Tablet By Mouth Two Times A Day 5)  Nicoderm Cq 7 Mg/24hr Pt24 (Nicotine) .... Apply One Patch A Day 6)  Triamcinolone Acetonide 0.1 % Crea (Triamcinolone Acetonide) .... Apply Two Times A  Day  Allergies (verified): No Known Drug Allergies   Review of Systems  The patient denies anorexia, fever, and weight loss.     Physical Exam  General:  alert, well-developed, well-nourished, and well-hydrated.   Head:  normocephalic and atraumatic.   Mouth:  pharynx pink and moist.  no thrush  Lungs:  normal breath sounds.   Skin:  papular rash on upper right arm    Impression & Recommendations:  Problem # 1:  HIV DISEASE (ICD-042) Will obtain labs today and have pt f/u in 2 weeks. 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 Apply two times a day  Other Orders: T-CD4SP [facility]) (CD4SP) T-HIV Viral Load [phone]) T-Comprehensive Metabolic Panel [phone]) T-CBC w/Diff ([id number]) Est. Patient Level III (57846)    Patient Instructions: 1)  Please schedule a follow-up appointment in 2 weeks. Prescriptions: TRIAMCINOLONE ACETONIDE 0.1 % CREA (TRIAMCINOLONE ACETONIDE) apply two times a day  #30gm x 1  Entered and Authorized by:   Yisroel Ramming MD   Signed by:   Yisroel Ramming MD on 05/07/2009   Method used:   Print then Give to Patient   RxID:   [id number] NICODERM CQ 7 MG/24HR PT24 (NICOTINE) apply one patch a day  #30 x 1   Entered and Authorized by:   Yisroel Ramming MD   Signed by:   Yisroel Ramming MD on 05/07/2009   Method used:   Print then Give to Patient   RxID:   [id number]

## 2010-03-09 NOTE — Miscellaneous (Signed)
Summary: RW Update  Clinical Lists Changes  Observations: Added new observation of DATE1STVISIT: 06/23/2009 (09/02/2009 12:25) Added new observation of RWPARTICIP: Yes (09/02/2009 12:25)

## 2010-03-09 NOTE — Assessment & Plan Note (Signed)
Summary: checkup [mkj]   CC:  follow-up visitk lab results and needs RX for Ensure.  History of Present Illness: Pt here for f/u.  He has been taking his meds every day.  Preventive Screening-Counseling & Management  Alcohol-Tobacco     Alcohol drinks/day: 0     Smoking Status: current     Packs/Day: <0.25  Caffeine-Diet-Exercise     Caffeine use/day: 0     Does Patient Exercise: yes     Type of exercise: steps     Exercise (avg: min/session): >60     Times/week: 5  Safety-Violence-Falls     Seat Belt Use: yes      Sexual History:  currently monogamous.    Comments: pt. given condoms   Updated Prior Medication List: TRUVADA 200-300 MG TABS (EMTRICITABINE-TENOFOVIR) Take 1 tablet by mouth once a day BACTRIM DS 800-160 MG TABS (SULFAMETHOXAZOLE-TRIMETHOPRIM) Take 1 tablet by mouth once a day ENSURE  LIQD (NUTRITIONAL SUPPLEMENTS) one can two times a day ISENTRESS 400 MG TABS (RALTEGRAVIR POTASSIUM) Take 1 tablet by mouth two times a day NICODERM CQ 7 MG/24HR PT24 (NICOTINE) apply one patch a day TRIAMCINOLONE ACETONIDE 0.1 % CREA (TRIAMCINOLONE ACETONIDE) apply two times a day  Current Allergies (reviewed today): No known allergies  Past History:  Past Medical History: Last updated: 07/10/2006 Allergic rhinitis Hepatitis C HIV disease Low back pain  Review of Systems  The patient denies anorexia, fever, and weight loss.    Vital Signs:  Patient profile:   50 year old male Height:      72 inches (182.88 cm) Weight:      148.8 pounds (67.64 kg) BMI:     20.25 Temp:     98.0 degrees F (36.67 degrees C) oral Pulse rate:   62 / minute BP sitting:   119 / 73  (right arm)  Vitals Entered By: Wendall Mola CMA Duncan Dull) (October 28, 2009 3:30 PM) CC: follow-up visitk lab results, needs RX for Ensure Is Patient Diabetic? No Pain Assessment Patient in pain? no      Nutritional Status BMI of 19 -24 = normal Nutritional Status Detail appetite  "ok"  Does patient need assistance? Functional Status Self care Ambulation Normal Comments no missed doses of meds per pt.   Physical Exam  General:  alert, well-developed, well-nourished, and well-hydrated.   Head:  normocephalic and atraumatic.   Mouth:  pharynx pink and moist.   Lungs:  normal breath sounds.      Impression & Recommendations:  Problem # 1:  HIV DISEASE (ICD-042) Pt.s most recent CD4ct was 130 and VL <20 .  Pt instructed to continue the current antiretroviral regimen.  Pt encouraged to take medication regularly and not miss doses.  Pt will f/u in 3 months for repeat blood work and will see me 2 weeks later.  Diagnostics Reviewed:  HIV: CDC-defined AIDS (05/08/2008)   CD4: 130 (10/16/2009)   WBC: 3.9 (10/15/2009)   Hgb: 13.0 (10/15/2009)   HCT: 39.6 (10/15/2009)   Platelets: 280 (10/15/2009) HIV genotype: See Comment (10/21/2008)   HIV-1 RNA: <20 copies/mL (10/15/2009)   HBSAg: NEG (10/15/2009)  Other Orders: Est. Patient Level III (04540) Future Orders: T-CD4SP ([facility]) (CD4SP) ... 01/26/2010 T-HIV Viral Load [phone]) ... 01/26/2010 T-Comprehensive Metabolic Panel [phone]) ... 01/26/2010 T-CBC w/Diff ([id number]) ... 01/26/2010  Patient Instructions: 1)  Please schedule a follow-up appointment in 3 months, 2 weeks after labs  Prescriptions: ENSURE  LIQD (NUTRITIONAL SUPPLEMENTS) one can two times a day  #  2 cases x prn   Entered and Authorized by:   Yisroel Ramming MD   Signed by:   Yisroel Ramming MD on 10/28/2009   Method used:   Print then Give to Patient   RxID:   [id number]   Appended Document: Orders Update    Clinical Lists Changes  Orders: Added new Service order of Influenza Vaccine MCR [phone]) - Signed Added new Service order of TB Skin Test [phone]) - Signed Added new Service order of Admin 1st Vaccine (10272) - Signed Observations: Added new observation of TB-PPD LOT#: [id number] (10/28/2009 16:46) Added new observation  of TB-PPD EXP: 12/10/2010 (10/28/2009 16:46) Added new observation of TB-PPD BY: Wendall Mola CMA ( AAMA) (10/28/2009 16:46) Added new observation of TB-PPD RTE: ID (10/28/2009 16:46) Added new observation of TB-PPD DSE: 0.1 ml (10/28/2009 16:46) Added new observation of TB-PPD MFR: Sanofi Pasteur (10/28/2009 16:46) Added new observation of TB-PPD SITE: right forearm (10/28/2009 16:46) Added new observation of TB-PPD: PPD (10/28/2009 16:46) Added new observation of FLU VAX#1VIS: 09/01/09 version given October 28, 2009. (10/28/2009 16:46) Added new observation of FLU VAXLOT: 1103 3P (10/28/2009 16:46) Added new observation of FLU VAX EXP: 05/09/2010 (10/28/2009 16:46) Added new observation of FLU VAXBY: Wendall Mola CMA ( AAMA) (10/28/2009 16:46) Added new observation of FLU VAXRTE: IM (10/28/2009 16:46) Added new observation of FLU VAX DSE: 0.5 ml (10/28/2009 16:46) Added new observation of FLU VAXMFR: Novartis (10/28/2009 16:46) Added new observation of FLU VAX SITE: right deltoid (10/28/2009 16:46) Added new observation of FLU VAX: Fluvax MCR (10/28/2009 16:46)       Immunizations Administered:  Influenza Vaccine # 1:    Vaccine Type: Fluvax MCR    Site: right deltoid    Mfr: Novartis    Dose: 0.5 ml    Route: IM    Given by: Wendall Mola CMA ( AAMA)    Exp. Date: 05/09/2010    Lot #: [id number]    VIS given: 09/01/09 version given October 28, 2009.  PPD Skin Test:    Vaccine Type: PPD    Site: right forearm    Mfr: Sanofi Pasteur    Dose: 0.1 ml    Route: ID    Given by: Wendall Mola CMA ( AAMA)    Exp. Date: 12/10/2010    Lot #: [id number]  Flu Vaccine Consent Questions:    Do you have a history of severe allergic reactions to this vaccine? no    Any prior history of allergic reactions to egg and/or gelatin? no    Do you have a sensitivity to the preservative Thimersol? no    Do you have a past history of Guillan-Barre Syndrome? no     Do you currently have an acute febrile illness? no    Have you ever had a severe reaction to latex? no    Vaccine information given and explained to patient? yes

## 2010-03-09 NOTE — Assessment & Plan Note (Signed)
Summary: 2WK F/U/VS   CC:  follow-up visit, needs refills, and has been out of all meds x 2 weeks.  History of Present Illness: Pt missed a few weeks of his meds.  He needs refills on his meds. No new problems.  Preventive Screening-Counseling & Management  Alcohol-Tobacco     Alcohol drinks/day: 0     Smoking Status: current     Packs/Day: <0.25  Caffeine-Diet-Exercise     Caffeine use/day: 0     Does Patient Exercise: yes     Type of exercise: steps     Exercise (avg: min/session): >60     Times/week: 5  Safety-Violence-Falls     Seat Belt Use: yes      Sexual History:  currently monogamous.    Comments: pt. declined condoms   Updated Prior Medication List: TRUVADA 200-300 MG TABS (EMTRICITABINE-TENOFOVIR) Take 1 tablet by mouth once a day BACTRIM DS 800-160 MG TABS (SULFAMETHOXAZOLE-TRIMETHOPRIM) Take 1 tablet by mouth once a day ENSURE  LIQD (NUTRITIONAL SUPPLEMENTS) one can two times a day ISENTRESS 400 MG TABS (RALTEGRAVIR POTASSIUM) Take 1 tablet by mouth two times a day NICODERM CQ 7 MG/24HR PT24 (NICOTINE) apply one patch a day TRIAMCINOLONE ACETONIDE 0.1 % CREA (TRIAMCINOLONE ACETONIDE) apply two times a day  Current Allergies (reviewed today): No known allergies  Past History:  Past Medical History: Last updated: 07/10/2006 Allergic rhinitis Hepatitis C HIV disease Low back pain  Social History: Sexual History:  currently monogamous  Review of Systems  The patient denies anorexia, fever, and weight loss.    Vital Signs:  Patient profile:   50 year old male Height:      72 inches (182.88 cm) Weight:      149.4 pounds (67.91 kg) BMI:     20.34 Temp:     98.4 degrees F (36.89 degrees C) oral Pulse rate:   71 / minute BP sitting:   119 / 73  (right arm)  Vitals Entered By: Wendall Mola CMA Duncan Dull) (Jun 23, 2009 2:49 PM) CC: follow-up visit, needs refills, has been out of all meds x 2 weeks Is Patient Diabetic? No Pain  Assessment Patient in pain? no      Nutritional Status BMI of 19 -24 = normal Nutritional Status Detail appetite "good"  Does patient need assistance? Functional Status Self care Ambulation Normal   Physical Exam  General:  alert, well-developed, well-nourished, and well-hydrated.   Head:  normocephalic and atraumatic.   Mouth:  pharynx pink and moist.  no thrush  Lungs:  normal breath sounds.      Impression & Recommendations:  Problem # 1:  HIV DISEASE (ICD-042) Viral load elevated. Discussed the need to take his meds every day. He will return in 3 months for repeat labs. If VL remains up will obtain a genotype. Diagnostics Reviewed:  HIV: CDC-defined AIDS (05/08/2008)   CD4: 200 (05/08/2008)   WBC: 2.9 (05/07/2009)   Hgb: 12.9 (05/07/2009)   HCT: 41.3 (05/07/2009)   Platelets: 197 (05/07/2009) HIV genotype: See Comment (10/21/2008)   HIV-1 RNA: 20400 (05/07/2009)     Future Orders: T-Hepatitis B Surface Antigen ([id number]) ... 09/21/2009  Other Orders: Est. Patient Level III (63875) Future Orders: T-CD4SP ([facility]) (CD4SP) ... 09/21/2009 T-HIV Viral Load [phone]) ... 09/21/2009 T-Comprehensive Metabolic Panel [id number]) ... 09/21/2009 T-CBC w/Diff ([id number]) ... 09/21/2009 T-Lipid Profile [id number]) ... 09/21/2009  Patient Instructions: 1)  Please schedule a follow-up appointment in 3 months,2 weeks after labs.  Prescriptions: NICODERM CQ 7  MG/24HR PT24 (NICOTINE) apply one patch a day  #30 x 1   Entered and Authorized by:   Yisroel Ramming MD   Signed by:   Yisroel Ramming MD on 06/23/2009   Method used:   Print then Give to Patient   RxID:   [id number] ISENTRESS 400 MG TABS (RALTEGRAVIR POTASSIUM) Take 1 tablet by mouth two times a day  #60 x 5   Entered and Authorized by:   Yisroel Ramming MD   Signed by:   Yisroel Ramming MD on 06/23/2009   Method used:   Print then Give to Patient   RxID:   [id number] BACTRIM DS 800-160 MG TABS  (SULFAMETHOXAZOLE-TRIMETHOPRIM) Take 1 tablet by mouth once a day  #30 x 5   Entered and Authorized by:   Yisroel Ramming MD   Signed by:   Yisroel Ramming MD on 06/23/2009   Method used:   Print then Give to Patient   RxID:   [id number] TRUVADA 200-300 MG TABS (EMTRICITABINE-TENOFOVIR) Take 1 tablet by mouth once a day  #30 x 5   Entered and Authorized by:   Yisroel Ramming MD   Signed by:   Yisroel Ramming MD on 06/23/2009   Method used:   Print then Give to Patient   RxID:   [id number]

## 2010-03-11 NOTE — Miscellaneous (Signed)
Summary: PPD results  Clinical Lists Changes  Observations: Added new observation of TB PPDRESULT: negative (02/19/2010 14:10) Added new observation of PPD RESULT: < 5mm (02/19/2010 14:10) Added new observation of TB-PPD RDDTE: 02/19/2010 (02/19/2010 14:10)      PPD Results    Date of reading: 02/19/2010    Results: < 5mm    Interpretation: negative

## 2010-03-11 NOTE — Assessment & Plan Note (Signed)
Summary: F/U/VS   CC:  follow-up visit, lab results, and pt. needs refill all meds.  History of Present Illness: patient is here for two follow-up on his lab results.  He's been taking his medications every day.  He has no new complaints.  He does need refills on his HIV medications. Pt denies abdominal pain, nausea or vomiting.  Preventive Screening-Counseling & Management  Alcohol-Tobacco     Alcohol drinks/day: 0     Smoking Status: current     Packs/Day: <0.25  Caffeine-Diet-Exercise     Caffeine use/day: 0     Does Patient Exercise: yes     Type of exercise: steps     Exercise (avg: min/session): >60     Times/week: 5  Safety-Violence-Falls     Seat Belt Use: yes      Sexual History:  currently monogamous.    Comments: pt. given condoms   Updated Prior Medication List: TRUVADA 200-300 MG TABS (EMTRICITABINE-TENOFOVIR) Take 1 tablet by mouth once a day BACTRIM DS 800-160 MG TABS (SULFAMETHOXAZOLE-TRIMETHOPRIM) Take 1 tablet by mouth once a day ENSURE  LIQD (NUTRITIONAL SUPPLEMENTS) one can two times a day ISENTRESS 400 MG TABS (RALTEGRAVIR POTASSIUM) Take 1 tablet by mouth two times a day NICODERM CQ 7 MG/24HR PT24 (NICOTINE) apply one patch a day TRIAMCINOLONE ACETONIDE 0.1 % CREA (TRIAMCINOLONE ACETONIDE) apply two times a day  Current Allergies (reviewed today): No known allergies  Past History:  Past Medical History: Last updated: 07/10/2006 Allergic rhinitis Hepatitis C HIV disease Low back pain  Review of Systems  The patient denies anorexia, fever, and weight loss.    Vital Signs:  Patient profile:   50 year old male Height:      72 inches (182.88 cm) Weight:      158.4 pounds (72 kg) BMI:     21.56 Temp:     98.4 degrees F (36.89 degrees C) oral Pulse rate:   67 / minute BP sitting:   107 / 68  (right arm)  Vitals Entered By: Wendall Mola CMA Duncan Dull) (February 16, 2010 3:05 PM) CC: follow-up visit, lab results, pt. needs  refill all meds Is Patient Diabetic? No Pain Assessment Patient in pain? no      Nutritional Status BMI of 19 -24 = normal Nutritional Status Detail appetite "good"  Have you ever been in a relationship where you felt threatened, hurt or afraid?No   Does patient need assistance? Functional Status Self care Ambulation Normal Comments no missed doses of meds per pt.   Physical Exam  General:  alert, well-developed, well-nourished, and well-hydrated.   Head:  normocephalic and atraumatic.   Mouth:  pharynx pink and moist.  no thrush  Abdomen:  soft, non-tender, and no distention.     Impression & Recommendations:  Problem # 1:  HIV DISEASE (ICD-042) Pt.s most recent CD4ct was 160 and VL <20 .  Pt instructed to continue the current antiretroviral regimen.  Pt encouraged to take medication regularly and not miss doses.  Pt will f/u in 3 months for repeat blood work and will see me 2 weeks later. Pneumovax given. His updated medication list for this problem includes:    Bactrim Ds 800-160 Mg Tabs (Sulfamethoxazole-trimethoprim) .Marland Kitchen... Take 1 tablet by mouth once a day  Diagnostics Reviewed:  HIV: CDC-defined AIDS (05/08/2008)   CD4: 160 (01/22/2010)   WBC: 3.8 (01/21/2010)   Hgb: 13.2 (01/21/2010)   HCT: 39.2 (01/21/2010)   Platelets: 250 (01/21/2010) HIV genotype: See Comment (  10/21/2008)   HIV-1 RNA: <20 copies/mL (01/21/2010)   HBSAg: NEG (10/15/2009)  Problem # 2:  OTHER NONSPECIFIC ABNORMAL SERUM ENZYME LEVELS (ICD-790.5) elevated LFTs pt denies ETOH intake will obtain an ultrasound Orders: Ultrasound (Ultrasound)  Other Orders: Est. Patient Level III (04540) Pneumococcal Vaccine (98119) Admin 1st Vaccine (14782) TB Skin Test (95621) Admin of Any Addtl Vaccine (30865) Future Orders: T-CD4SP ([facility]) (CD4SP) ... 05/17/2010 T-HIV Viral Load [phone]) ... 05/17/2010 T-Comprehensive Metabolic Panel [phone]) ... 05/17/2010 T-CBC w/Diff ([id number]) ...  05/17/2010 T-RPR (Syphilis) [phone]) ... 05/17/2010  Patient Instructions: 1)  Please schedule a follow-up appointment in 3 months, 2 weeks after labs.  Prescriptions: TRUVADA 200-300 MG TABS (EMTRICITABINE-TENOFOVIR) Take 1 tablet by mouth once a day  #30 x 5   Entered and Authorized by:   Yisroel Ramming MD   Signed by:   Yisroel Ramming MD on 02/16/2010   Method used:   Print then Give to Patient   RxID:   [id number] ISENTRESS 400 MG TABS (RALTEGRAVIR POTASSIUM) Take 1 tablet by mouth two times a day  #60 x 5   Entered and Authorized by:   Yisroel Ramming MD   Signed by:   Yisroel Ramming MD on 02/16/2010   Method used:   Print then Give to Patient   RxID:   [id number]       Immunizations Administered:  Pneumonia Vaccine:    Vaccine Type: Pneumovax    Site: right deltoid    Mfr: Merck    Dose: 0.5 ml    Route: IM    Given by: Wendall Mola CMA ( AAMA)    Exp. Date: 06/17/2011    Lot #: [id number]    VIS given: 01/12/09 version given February 16, 2010.  PPD Skin Test:    Vaccine Type: PPD    Site: right forearm    Mfr: Sanofi Pasteur    Dose: 0.1 ml    Route: ID    Given by: Wendall Mola CMA ( AAMA)    Exp. Date: 08/14/2011    Lot #: [id number]

## 2010-03-31 IMAGING — CR DG CHEST 2V
2 series · 2 of 2 positions shown · non-contrast
Comparison: 10/13/2008 and 10/12/2008.  CT of 10/05/2008.

CLINICAL DATA: Follow up of right-sided pneumothorax.  Status post
chest tube removal.

CHEST - 2 VIEW

[w chest pa]
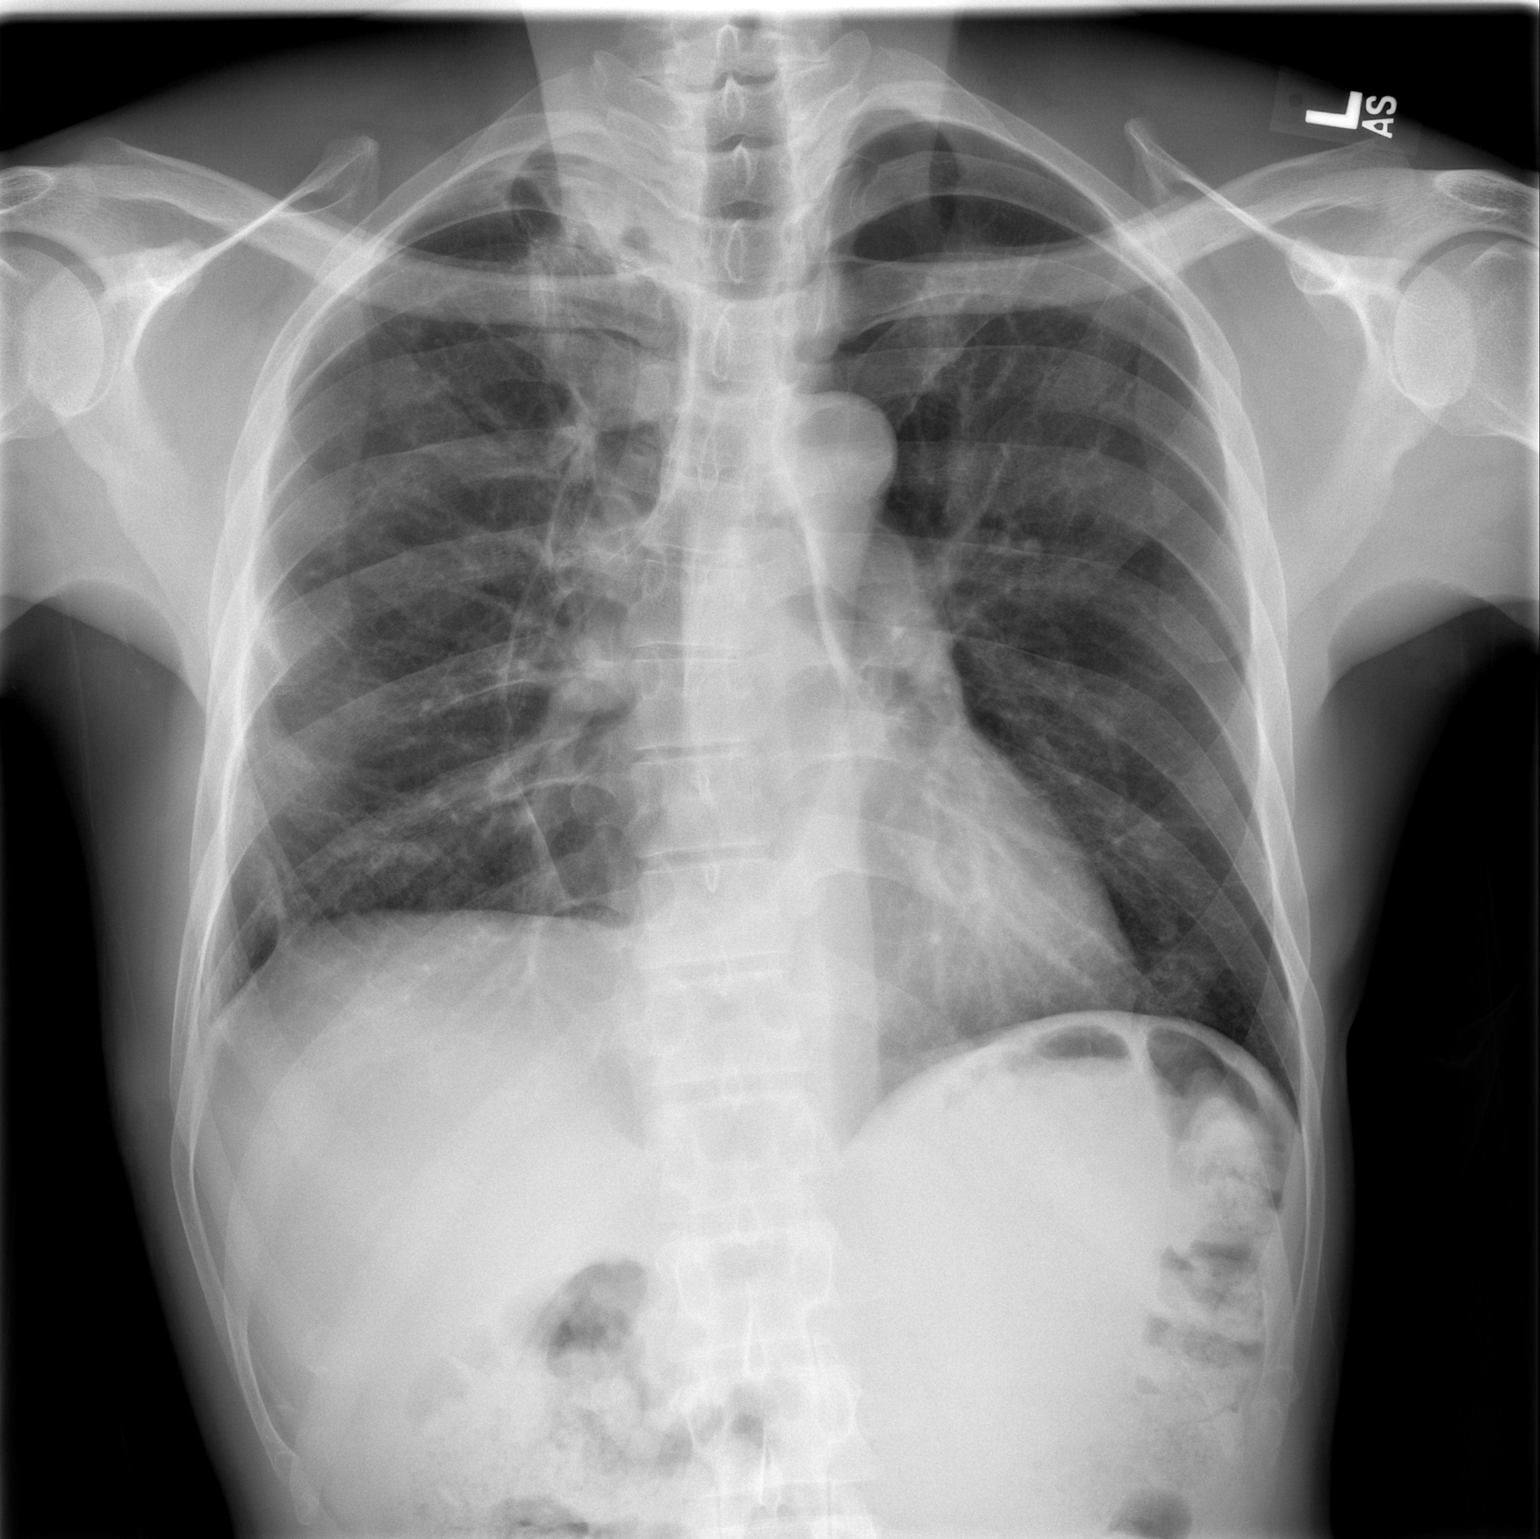

[w chest lat]
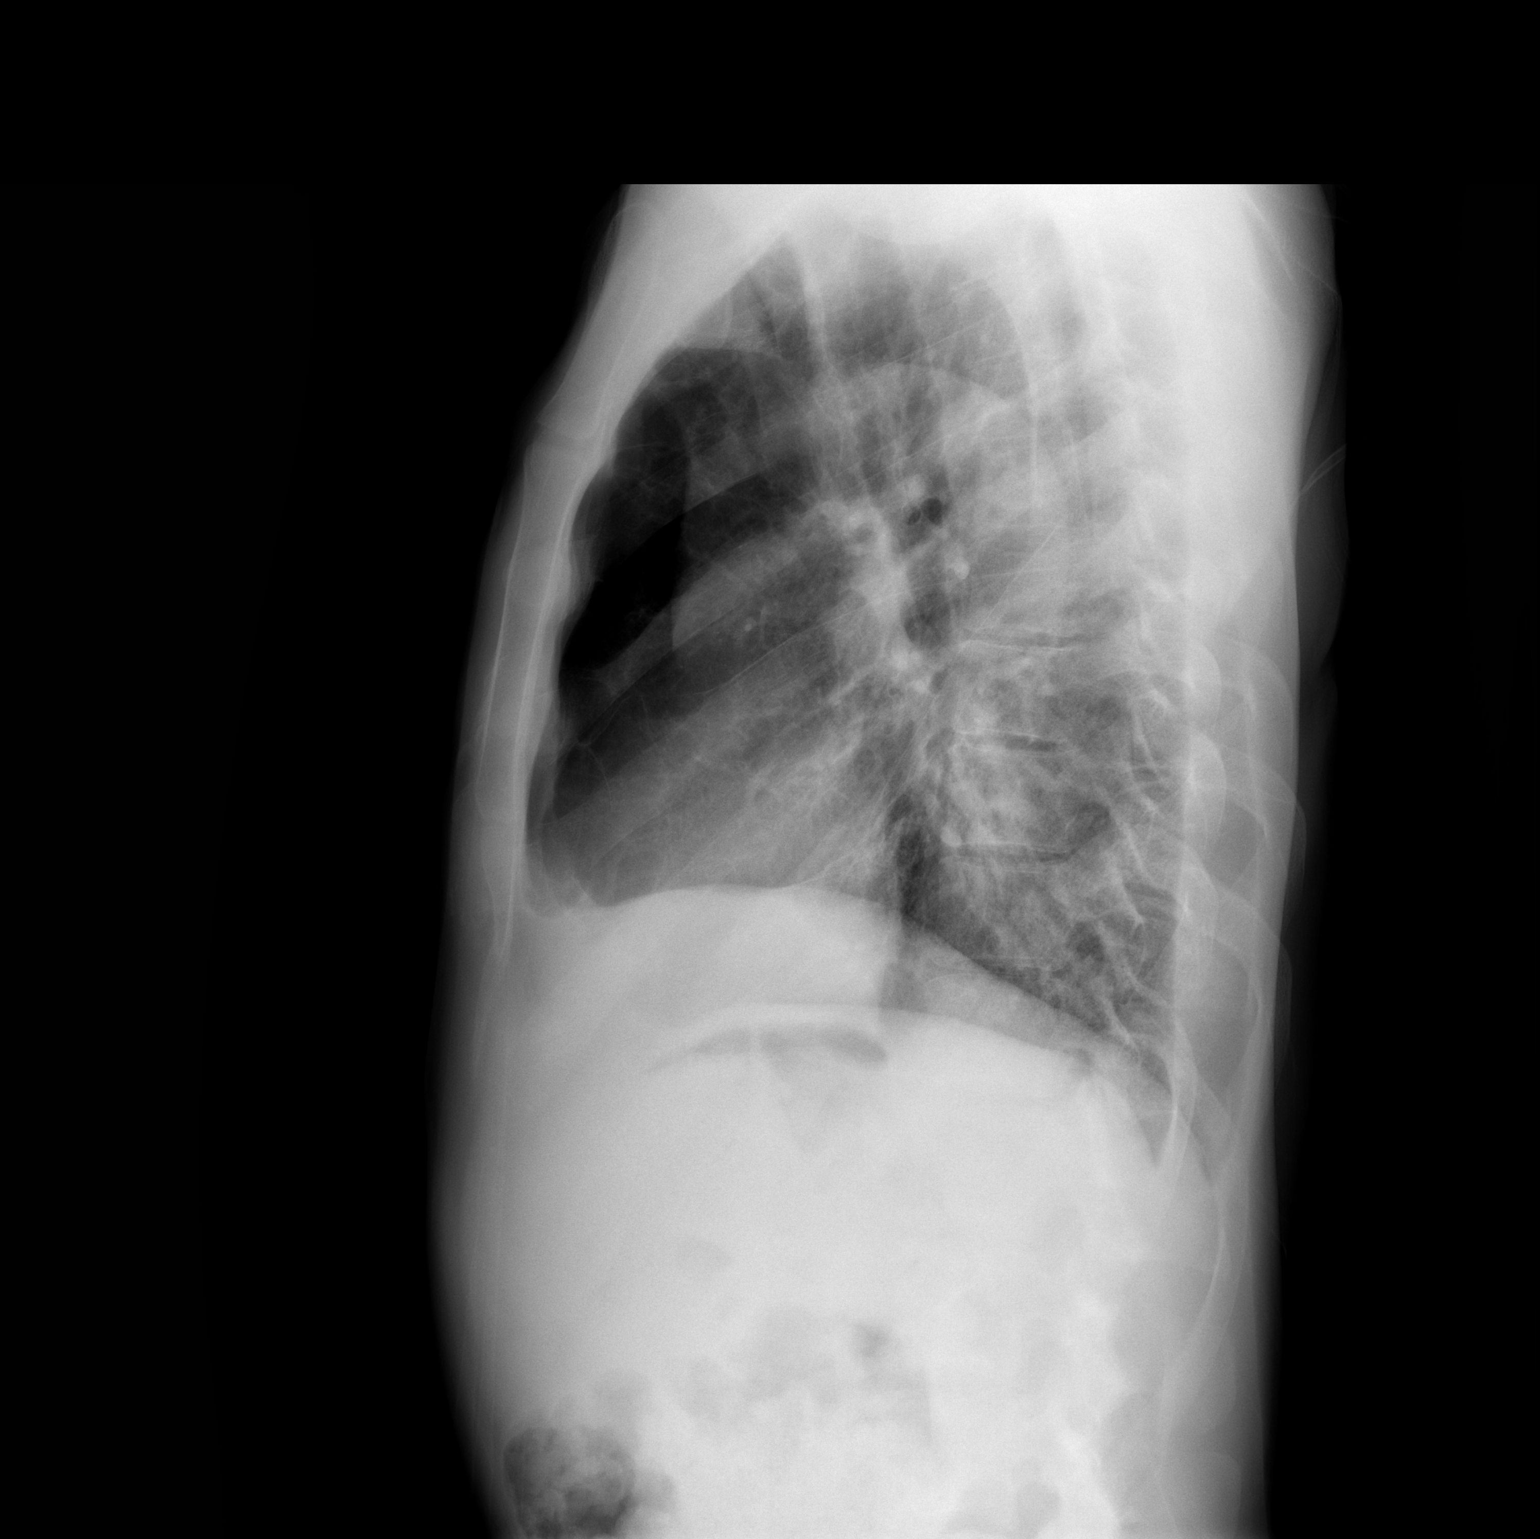

[2 of 2 positions shown; findings below may reference images not displayed]

FINDINGS: Midline trachea.

Normal heart size.  Displacement of the anterior aspect of the
mediastinum to the right is similar to on the prior exam.

No pleural fluid.  No apical pneumothorax.  The previously
described lucency anteriorly and superiorly on the lateral view is
not well visualized.  There is more inferior anterior lucency on
the lateral which is felt to correspond to an area of central right
hemithorax lucency on the frontal.  This lucency appears enlarged,
measuring 14.2 x 9.4 cm versus 8.6 x 8.2 cm on the prior exam.
When compared to the prior CT of 10/05/2008, this is felt to
represent a combination of a prominent right anterior medial upper
lobe bleb and loculated hydropneumothorax.

Chronic interstitial thickening.  Right apical pleural parenchymal
opacity is similar.  Volume loss right hemithorax with improved
right base aeration.
IMPRESSION: 1.  A right paracentral gas collection on the frontal view is
enlarged since the 10/13/2008 exam.  When correlated to prior chest
CT, this is likely a combination of focal bleb  involving the right
upper lobe and adjacent loculated hydropneumothorax.
2.  Improved right base aeration.

## 2010-04-19 LAB — T-HELPER CELL (CD4) - (RCID CLINIC ONLY)
CD4 % Helper T Cell: 10 % — ABNORMAL LOW (ref 33–55)
CD4 T Cell Abs: 160 uL — ABNORMAL LOW (ref 400–2700)

## 2010-04-20 ENCOUNTER — Encounter: Payer: Self-pay | Admitting: Adult Health

## 2010-04-20 ENCOUNTER — Other Ambulatory Visit: Payer: Self-pay | Admitting: Infectious Diseases

## 2010-04-20 ENCOUNTER — Encounter: Payer: Self-pay | Admitting: Infectious Diseases

## 2010-04-20 ENCOUNTER — Other Ambulatory Visit (INDEPENDENT_AMBULATORY_CARE_PROVIDER_SITE_OTHER): Payer: Medicare Other

## 2010-04-20 DIAGNOSIS — B20 Human immunodeficiency virus [HIV] disease: Secondary | ICD-10-CM

## 2010-04-20 LAB — CONVERTED CEMR LAB
ALT: 73 units/L — ABNORMAL HIGH (ref 0–53)
AST: 119 units/L — ABNORMAL HIGH (ref 0–37)
Albumin: 4.3 g/dL (ref 3.5–5.2)
Alkaline Phosphatase: 55 units/L (ref 39–117)
BUN: 10 mg/dL (ref 6–23)
Basophils Absolute: 0 10*3/uL (ref 0.0–0.1)
Basophils Relative: 1 % (ref 0–1)
CO2: 26 meq/L (ref 19–32)
Calcium: 9.4 mg/dL (ref 8.4–10.5)
Chloride: 104 meq/L (ref 96–112)
Creatinine, Ser: 0.86 mg/dL (ref 0.40–1.50)
Eosinophils Absolute: 0.1 10*3/uL (ref 0.0–0.7)
Eosinophils Relative: 3 % (ref 0–5)
Glucose, Bld: 77 mg/dL (ref 70–99)
HCT: 43.5 % (ref 39.0–52.0)
HIV 1 RNA Quant: 37800 copies/mL — ABNORMAL HIGH (ref <20)
HIV-1 RNA Quant, Log: 4.58 — ABNORMAL HIGH (ref <1.30)
Hemoglobin: 14.3 g/dL (ref 13.0–17.0)
Lymphocytes Relative: 36 % (ref 12–46)
Lymphs Abs: 1.2 10*3/uL (ref 0.7–4.0)
MCHC: 32.9 g/dL (ref 30.0–36.0)
MCV: 87.2 fL (ref 78.0–100.0)
Monocytes Absolute: 0.5 10*3/uL (ref 0.1–1.0)
Monocytes Relative: 15 % — ABNORMAL HIGH (ref 3–12)
Neutro Abs: 1.5 10*3/uL — ABNORMAL LOW (ref 1.7–7.7)
Neutrophils Relative %: 45 % (ref 43–77)
Platelets: 213 10*3/uL (ref 150–400)
Potassium: 4.7 meq/L (ref 3.5–5.3)
RBC: 4.99 M/uL (ref 4.22–5.81)
RDW: 13.7 % (ref 11.5–15.5)
Sodium: 139 meq/L (ref 135–145)
Total Bilirubin: 0.4 mg/dL (ref 0.3–1.2)
Total Protein: 8 g/dL (ref 6.0–8.3)
WBC: 3.3 10*3/uL — ABNORMAL LOW (ref 4.0–10.5)

## 2010-04-21 LAB — T-HELPER CELL (CD4) - (RCID CLINIC ONLY)
CD4 % Helper T Cell: 6 % — ABNORMAL LOW (ref 33–55)
CD4 T Cell Abs: 70 uL — ABNORMAL LOW (ref 400–2700)

## 2010-04-22 LAB — T-HELPER CELL (CD4) - (RCID CLINIC ONLY)
CD4 % Helper T Cell: 9 % — ABNORMAL LOW (ref 33–55)
CD4 T Cell Abs: 130 uL — ABNORMAL LOW (ref 400–2700)

## 2010-05-04 ENCOUNTER — Ambulatory Visit (INDEPENDENT_AMBULATORY_CARE_PROVIDER_SITE_OTHER): Payer: Medicare Other | Admitting: Internal Medicine

## 2010-05-04 ENCOUNTER — Other Ambulatory Visit (INDEPENDENT_AMBULATORY_CARE_PROVIDER_SITE_OTHER): Payer: Medicare Other | Admitting: *Deleted

## 2010-05-04 ENCOUNTER — Encounter: Payer: Self-pay | Admitting: Internal Medicine

## 2010-05-04 ENCOUNTER — Other Ambulatory Visit: Payer: Self-pay | Admitting: *Deleted

## 2010-05-04 VITALS — BP 104/70 | HR 67 | Temp 97.4°F | Ht 71.0 in | Wt 149.8 lb

## 2010-05-04 DIAGNOSIS — B20 Human immunodeficiency virus [HIV] disease: Secondary | ICD-10-CM

## 2010-05-04 DIAGNOSIS — Z21 Asymptomatic human immunodeficiency virus [HIV] infection status: Secondary | ICD-10-CM

## 2010-05-04 MED ORDER — EMTRICITABINE-TENOFOVIR DF 200-300 MG PO TABS: 1.0000 | ORAL_TABLET | Freq: Every day | ORAL | Status: DC

## 2010-05-04 MED ORDER — ENSURE PO LIQD: 2.0000 | Freq: Two times a day (BID) | ORAL | Status: DC

## 2010-05-04 MED ORDER — RALTEGRAVIR POTASSIUM 400 MG PO TABS: 400.0000 mg | ORAL_TABLET | Freq: Two times a day (BID) | ORAL | Status: DC

## 2010-05-04 MED ORDER — SULFAMETHOXAZOLE-TMP DS 800-160 MG PO TABS: 1.0000 | ORAL_TABLET | Freq: Every day | ORAL | Status: DC

## 2010-05-04 NOTE — Progress Notes (Signed)
  Subjective:    Patient ID: William [name], male    DOB: August 03, 1960, 50 y.o.   MRN: [id number]  HPI William [name] is in for his routine visit. He says he uses a pill box but cannot pick his meds off the chart correctly and says he takes Truvada twice daily. He has missed doses recently after his sister died and when he was out of town last week.    Review of Systems     Objective:   Physical Exam  Constitutional: He appears well-developed and well-nourished.  Cardiovascular: Normal rate, regular rhythm and normal heart sounds.   Pulmonary/Chest: Breath sounds normal. He has no wheezes.          Assessment & Plan:

## 2010-05-04 NOTE — Assessment & Plan Note (Signed)
William [name] has been off his meds and I am not sure he knows the correct doses. I will check a genotype, ask our adherence counselor to meet with him and have him return after repeat labs in 6 weeks.

## 2010-05-13 LAB — HIV-1 GENOTYPR PLUS

## 2010-05-14 LAB — COMPREHENSIVE METABOLIC PANEL
ALT: 33 U/L (ref 0–53)
AST: 37 U/L (ref 0–37)
Albumin: 3.2 g/dL — ABNORMAL LOW (ref 3.5–5.2)
Alkaline Phosphatase: 44 U/L (ref 39–117)
BUN: 7 mg/dL (ref 6–23)
CO2: 29 mEq/L (ref 19–32)
Calcium: 8.8 mg/dL (ref 8.4–10.5)
Chloride: 100 mEq/L (ref 96–112)
Creatinine, Ser: 0.84 mg/dL (ref 0.4–1.5)
GFR calc Af Amer: >60 mL/min (ref >60)
GFR calc non Af Amer: >60 mL/min (ref >60)
Glucose, Bld: 134 mg/dL — ABNORMAL HIGH (ref 70–99)
Potassium: 4.6 mEq/L (ref 3.5–5.1)
Sodium: 135 mEq/L (ref 135–145)
Total Bilirubin: 0.4 mg/dL (ref 0.3–1.2)
Total Protein: 7.7 g/dL (ref 6.0–8.3)

## 2010-05-14 LAB — BLOOD GAS, ARTERIAL
Acid-base deficit: 0.3 mmol/L (ref 0.0–2.0)
Bicarbonate: 23.7 mEq/L (ref 20.0–24.0)
O2 Saturation: 97.9 %
Patient temperature: 98.6
TCO2: 24.9 mmol/L (ref 0–100)
pCO2 arterial: 38 mmHg (ref 35.0–45.0)
pH, Arterial: 7.412 (ref 7.350–7.450)
pO2, Arterial: 104 mmHg — ABNORMAL HIGH (ref 80.0–100.0)

## 2010-05-14 LAB — BASIC METABOLIC PANEL
BUN: 8 mg/dL (ref 6–23)
CO2: 24 mEq/L (ref 19–32)
Calcium: 8.6 mg/dL (ref 8.4–10.5)
Chloride: 99 mEq/L (ref 96–112)
Creatinine, Ser: 0.72 mg/dL (ref 0.4–1.5)
GFR calc Af Amer: >60 mL/min (ref >60)
GFR calc non Af Amer: >60 mL/min (ref >60)
Glucose, Bld: 173 mg/dL — ABNORMAL HIGH (ref 70–99)
Potassium: 4.5 mEq/L (ref 3.5–5.1)
Sodium: 128 mEq/L — ABNORMAL LOW (ref 135–145)

## 2010-05-14 LAB — CBC
HCT: 28.5 % — ABNORMAL LOW (ref 39.0–52.0)
HCT: 30.3 % — ABNORMAL LOW (ref 39.0–52.0)
Hemoglobin: 10 g/dL — ABNORMAL LOW (ref 13.0–17.0)
Hemoglobin: 9.4 g/dL — ABNORMAL LOW (ref 13.0–17.0)
MCHC: 32.9 g/dL (ref 30.0–36.0)
MCHC: 33 g/dL (ref 30.0–36.0)
MCV: 85 fL (ref 78.0–100.0)
MCV: 85 fL (ref 78.0–100.0)
Platelets: 305 10*3/uL (ref 150–400)
Platelets: 319 10*3/uL (ref 150–400)
RBC: 3.35 MIL/uL — ABNORMAL LOW (ref 4.22–5.81)
RBC: 3.56 MIL/uL — ABNORMAL LOW (ref 4.22–5.81)
RDW: 13.2 % (ref 11.5–15.5)
RDW: 13.3 % (ref 11.5–15.5)
WBC: 11.1 10*3/uL — ABNORMAL HIGH (ref 4.0–10.5)
WBC: 8.6 10*3/uL (ref 4.0–10.5)

## 2010-05-14 LAB — URINALYSIS, ROUTINE W REFLEX MICROSCOPIC
Bilirubin Urine: NEGATIVE
Glucose, UA: NEGATIVE mg/dL
Hgb urine dipstick: NEGATIVE
Ketones, ur: NEGATIVE mg/dL
Nitrite: NEGATIVE
Protein, ur: NEGATIVE mg/dL
Specific Gravity, Urine: 1.017 (ref 1.005–1.030)
Urobilinogen, UA: 1 mg/dL (ref 0.0–1.0)
pH: 7 (ref 5.0–8.0)

## 2010-05-15 LAB — BASIC METABOLIC PANEL
BUN: 8 mg/dL (ref 6–23)
CO2: 29 mEq/L (ref 19–32)
Calcium: 9.1 mg/dL (ref 8.4–10.5)
Chloride: 98 mEq/L (ref 96–112)
Creatinine, Ser: 0.76 mg/dL (ref 0.4–1.5)
GFR calc Af Amer: >60 mL/min (ref >60)
GFR calc non Af Amer: >60 mL/min (ref >60)
Glucose, Bld: 90 mg/dL (ref 70–99)
Potassium: 4.7 mEq/L (ref 3.5–5.1)
Sodium: 134 mEq/L — ABNORMAL LOW (ref 135–145)

## 2010-05-15 LAB — POCT CARDIAC MARKERS
CKMB, poc: 8.9 ng/mL (ref 1.0–8.0)
Myoglobin, poc: 179 ng/mL (ref 12–200)
Troponin i, poc: <0.05 ng/mL (ref 0.00–0.09)

## 2010-05-15 LAB — CBC
HCT: 31.5 % — ABNORMAL LOW (ref 39.0–52.0)
HCT: 32.5 % — ABNORMAL LOW (ref 39.0–52.0)
HCT: 33 % — ABNORMAL LOW (ref 39.0–52.0)
Hemoglobin: 10.8 g/dL — ABNORMAL LOW (ref 13.0–17.0)
Hemoglobin: 10.8 g/dL — ABNORMAL LOW (ref 13.0–17.0)
Hemoglobin: 11 g/dL — ABNORMAL LOW (ref 13.0–17.0)
MCHC: 33.1 g/dL (ref 30.0–36.0)
MCHC: 33.3 g/dL (ref 30.0–36.0)
MCHC: 34.2 g/dL (ref 30.0–36.0)
MCV: 84.9 fL (ref 78.0–100.0)
MCV: 85.5 fL (ref 78.0–100.0)
MCV: 86.8 fL (ref 78.0–100.0)
Platelets: 238 10*3/uL (ref 150–400)
Platelets: 314 10*3/uL (ref 150–400)
Platelets: 351 10*3/uL (ref 150–400)
RBC: 3.68 MIL/uL — ABNORMAL LOW (ref 4.22–5.81)
RBC: 3.8 MIL/uL — ABNORMAL LOW (ref 4.22–5.81)
RBC: 3.83 MIL/uL — ABNORMAL LOW (ref 4.22–5.81)
RDW: 13.4 % (ref 11.5–15.5)
RDW: 13.8 % (ref 11.5–15.5)
RDW: 13.9 % (ref 11.5–15.5)
WBC: 3.9 10*3/uL — ABNORMAL LOW (ref 4.0–10.5)
WBC: 4.3 10*3/uL (ref 4.0–10.5)
WBC: 7.6 10*3/uL (ref 4.0–10.5)

## 2010-05-15 LAB — COMPREHENSIVE METABOLIC PANEL
ALT: 47 U/L (ref 0–53)
ALT: 63 U/L — ABNORMAL HIGH (ref 0–53)
AST: 55 U/L — ABNORMAL HIGH (ref 0–37)
AST: 95 U/L — ABNORMAL HIGH (ref 0–37)
Albumin: 3.4 g/dL — ABNORMAL LOW (ref 3.5–5.2)
Albumin: 3.7 g/dL (ref 3.5–5.2)
Alkaline Phosphatase: 50 U/L (ref 39–117)
Alkaline Phosphatase: 51 U/L (ref 39–117)
BUN: 10 mg/dL (ref 6–23)
BUN: 6 mg/dL (ref 6–23)
CO2: 26 mEq/L (ref 19–32)
CO2: 30 mEq/L (ref 19–32)
Calcium: 8.4 mg/dL (ref 8.4–10.5)
Calcium: 9 mg/dL (ref 8.4–10.5)
Chloride: 103 mEq/L (ref 96–112)
Chloride: 105 mEq/L (ref 96–112)
Creatinine, Ser: 0.92 mg/dL (ref 0.4–1.5)
Creatinine, Ser: 0.93 mg/dL (ref 0.4–1.5)
GFR calc Af Amer: >60 mL/min (ref >60)
GFR calc Af Amer: >60 mL/min (ref >60)
GFR calc non Af Amer: >60 mL/min (ref >60)
GFR calc non Af Amer: >60 mL/min (ref >60)
Glucose, Bld: 74 mg/dL (ref 70–99)
Glucose, Bld: 87 mg/dL (ref 70–99)
Potassium: 3.6 mEq/L (ref 3.5–5.1)
Potassium: 3.9 mEq/L (ref 3.5–5.1)
Sodium: 138 mEq/L (ref 135–145)
Sodium: 138 mEq/L (ref 135–145)
Total Bilirubin: 0.5 mg/dL (ref 0.3–1.2)
Total Bilirubin: 0.8 mg/dL (ref 0.3–1.2)
Total Protein: 7 g/dL (ref 6.0–8.3)
Total Protein: 8.4 g/dL — ABNORMAL HIGH (ref 6.0–8.3)

## 2010-05-15 LAB — T-HELPER CELLS (CD4) COUNT (NOT AT ARMC)
CD4 % Helper T Cell: 10 % — ABNORMAL LOW (ref 33–55)
CD4 T Cell Abs: 120 uL — ABNORMAL LOW (ref 400–2700)

## 2010-05-15 LAB — DIFFERENTIAL
Basophils Absolute: 0 10*3/uL (ref 0.0–0.1)
Basophils Relative: 1 % (ref 0–1)
Eosinophils Absolute: 0.3 10*3/uL (ref 0.0–0.7)
Eosinophils Relative: 7 % — ABNORMAL HIGH (ref 0–5)
Lymphocytes Relative: 41 % (ref 12–46)
Lymphs Abs: 1.6 10*3/uL (ref 0.7–4.0)
Monocytes Absolute: 0.5 10*3/uL (ref 0.1–1.0)
Monocytes Relative: 13 % — ABNORMAL HIGH (ref 3–12)
Neutro Abs: 1.5 10*3/uL — ABNORMAL LOW (ref 1.7–7.7)
Neutrophils Relative %: 38 % — ABNORMAL LOW (ref 43–77)

## 2010-05-15 LAB — HIV-1 RNA QUANT-NO REFLEX-BLD
HIV 1 RNA Quant: 7930 copies/mL — ABNORMAL HIGH (ref <48)
HIV-1 RNA Quant, Log: 3.9 {Log} — ABNORMAL HIGH (ref <1.68)

## 2010-05-15 LAB — BRAIN NATRIURETIC PEPTIDE: Pro B Natriuretic peptide (BNP): <30 pg/mL (ref 0.0–100.0)

## 2010-05-16 LAB — PROTIME-INR
INR: 1 (ref 0.00–1.49)
Prothrombin Time: 13.9 seconds (ref 11.6–15.2)

## 2010-05-16 LAB — CBC
HCT: 38.5 % — ABNORMAL LOW (ref 39.0–52.0)
Hemoglobin: 12.5 g/dL — ABNORMAL LOW (ref 13.0–17.0)
MCHC: 32.5 g/dL (ref 30.0–36.0)
MCV: 89.8 fL (ref 78.0–100.0)
Platelets: 226 10*3/uL (ref 150–400)
RBC: 4.28 MIL/uL (ref 4.22–5.81)
RDW: 13.7 % (ref 11.5–15.5)
WBC: 6.1 10*3/uL (ref 4.0–10.5)

## 2010-05-16 LAB — DIFFERENTIAL
Basophils Absolute: 0 10*3/uL (ref 0.0–0.1)
Basophils Relative: 0 % (ref 0–1)
Eosinophils Absolute: 0.3 10*3/uL (ref 0.0–0.7)
Eosinophils Relative: 5 % (ref 0–5)
Lymphocytes Relative: 23 % (ref 12–46)
Lymphs Abs: 1.4 10*3/uL (ref 0.7–4.0)
Monocytes Absolute: 0.7 10*3/uL (ref 0.1–1.0)
Monocytes Relative: 11 % (ref 3–12)
Neutro Abs: 3.7 10*3/uL (ref 1.7–7.7)
Neutrophils Relative %: 61 % (ref 43–77)

## 2010-05-16 LAB — APTT: aPTT: 27 seconds (ref 24–37)

## 2010-06-22 NOTE — Assessment & Plan Note (Signed)
OFFICE VISIT   William [name], William [name]  DOB:  May 10, 1960                                        October 21, 2008  CHART #:  [id number]   The patient returned to my office today for follow up status post right  VATS with stapling of multiple blebs and parietal pleurectomy for  recurrent spontaneous right pneumothorax.  Since discharge he said that  he has had mild right chest wall discomfort.  He has had no shortness of  breath.   PHYSICAL EXAMINATION:  Vital Signs:  Blood pressure 112/78, pulse is 70  and regular, his respiratory rate is 20 and unlabored.  Oxygen  saturation on room air is 97%.  General:  He looks well.  Cardiac:  Regular rate and rhythm with normal heart sounds.  Lung:  Decreased  breath sounds throughout both lung fields.  Chest:  The right chest  incisions are all healing well.   Followup chest x-ray today shows a persistent right paracentral gas  collection on the final view and Radiology felt it was larger than on  previous chest x-ray of 10/13/2008.  There is improved aeration at the  right base.   IMPRESSION:  Overall the patient is recovering fairly well from his  surgery.  His chest x-ray does show persistent air collection in the  right paracentral location which could be persistent blebs or some  loculated pneumothorax.  It may be slightly larger than on previous  chest x-ray.  There is no definite free pneumothorax.  The lungs are  well-aerated.  I do not think there is any further intervention  warranted at this time and we will continue to follow this since he is  doing well clinically.  I will plan to repeat his chest x-ray in about 3  months and see him back in the office after that.  I did write him a  prescription for oxycodone IR 5 mg one q.6 h. p.r.n. for pain #40.   Evelene Croon, M.D.  Electronically Signed   BB/MEDQ  D:  10/21/2008  T:  10/22/2008  Job:  [id number]

## 2010-06-22 NOTE — Op Note (Signed)
William [name], [name]             ACCOUNT NO.:  [id number]   MEDICAL RECORD NO.:  [id number]          PATIENT TYPE:  INP   LOCATION:  3302                         FACILITY:  [facility]   PHYSICIAN:  Evelene Croon, M.D.     DATE OF BIRTH:  12-21-1960   DATE OF PROCEDURE:  10/08/2008  DATE OF DISCHARGE:                               OPERATIVE REPORT   PREOPERATIVE DIAGNOSIS:  Recurrent spontaneous right pneumothorax.   POSTOPERATIVE DIAGNOSIS:  Recurrent spontaneous right pneumothorax.   PROCEDURE:  Right video-assisted thoracoscopy with stapling of multiple  blebs and parietal pleurectomy.   SURGEON:  Evelene Croon, MD   ASSISTANT:  Rowe Clack, PA-C   ANESTHESIA:  General endotracheal.   CLINICAL HISTORY:  This patient is a 50 year old gentleman, who is HIV  positive with a history of anxiety disorder and bipolar disorder, who  was admitted on September 30, 2008, with a spontaneous right pneumothorax.  The patient had his chest tube removed on October 04, 2008, but  unfortunately, developed a recurrent large right pneumothorax on the  same day.  The patient had a second chest tube placed.  He did not have  any persistent air leak, but CT scan showed large blebs along the medial  aspect of the right upper lobe.  After discussion with Dr. Edwyna Shell, it  was felt that the best option would be to proceed with thoracoscopic  stapling of these blebs to prevent likely recurrence.  The operative  procedure was discussed with the patient including alternatives,  benefits, and risks including, but not limited to bleeding, blood  transfusion, infection, injury to the lung, prolonged air leak, and  recurrence of the pneumothorax despite surgery.  He understood all this  and agreed to proceed.   OPERATIVE PROCEDURE:  The patient was taken to the operating room and  placed on the table in the supine position.  I have seen him  preoperatively in the holding area and confirmed that was the proper  patient that the right side was proper operative side and we were doing  the proper operation.  The right side of the chest was signed by me, and  the consent was signed.  In the operating room, the patient placed under  general endotracheal anesthesia.  Foley cath was placed in the bladder  using sterile technique.  Lower extremity pneumatic compression devices  were placed.  The patient had a double-lumen endotracheal tube used.  He  was positioned in the left lateral decubitus position with the right  side up.  The previous right chest tube was removed.  The right side of  the chest was prepped with Betadine soap and solution and draped in  usual sterile manner.  A time-out was taken, the proper patient, proper  operative side, and proper operation were confirmed with nursing and  anesthesia staff.  Then, the right lung was deflated.  A 1-cm incision  was made in midaxillary line at approximately the eighth intercostal  space.  Blunt dissection was continued down into the pleural space, and  a 10-mm trocar was inserted.  The 30-degree thoracoscope was inserted.  Inspection of the pleural space showed there were some loose adhesions  present between the lung and the chest wall anteriorly.  These were  easily separated with blunt dissection.  Two more incisions were made  for instruments.  One was positioned in the posterior axillary line at  about the fourth intercostal space and the anterior axillary line about  the third intercostal space.  Examination of the right upper lobe showed  that there were multiple blebs along the medial aspect adjacent to the  mediastinum.  Then, an EZ 45 noncutting stapler was inserted through the  posterior axillary incision, and staple lines were placed across the  base of these blebs.  This required total about 11 staple lines to  completely occlude all of these blebs, which were fairly large.  The  remainder of the lung appeared somewhat emphysematous,  but there were no  other significant large blebs present.  Then, parietal pleurectomy was  performed using a clamp to mechanically remove the parietal pleura.  This parietal pleurectomy encompass the majority of pleural space.  There were some oozing from the chest wall, but relatively good  hemostasis.  Then, a 28-French chest tube was positioned through the  midaxillary incision and advanced up to the apex.  It was fixed with  skin with silk suture.  The right lung was reinflated.  No visible air  leak was seen.  The 2 remaining incisions were closed with 2-0 Vicryl  muscular and subcutaneous sutures and 3-0 Vicryl subcuticular skin  closure.  The 2 prior chest tube incisions were loosely reapproximated  with a silk suture.  The sponge, needle, and instrument counts were  correct according to the scrub nurse.  The patient was then turned in  the supine position, extubated, and transported to the Post Anesthesia  Care Unit in satisfactory and stable condition.      Evelene Croon, M.D.  Electronically Signed     BB/MEDQ  D:  10/08/2008  T:  10/09/2008  Job:  [id number]

## 2010-06-22 NOTE — Assessment & Plan Note (Signed)
OFFICE VISIT   DAVONE, SHINAULT E  DOB:  January 17, 1961                                        December 15, 2008  CHART #:  [id number]   REASON FOR OFFICE VISIT:  Right chest wall pain.   HISTORY OF PRESENT ILLNESS:  This is a 50 year old African American male  who was previously seen on October 21, 2008, for routine follow up  after right VATS, stapling of multiple blebs, parietal pleurectomy by  Dr. Laneta Simmers on October 08, 2008.  At that office visit, he had  complaints of mild right chest discomfort but no shortness of breath.  The patient was going to be seen in 3 months with a repeat chest x-ray;  however, the patient continued with occasional chest wall pain and asked  for a prescription refill.  He had been given a refill of the oxy on  October 21, 2008, and also had Lortab 7.5/500 mg given on November 18, 2008.  His only other complaint is occasional shortness of breath.  He  denies any fever, chills, night sweats, cough or sputum production.   PHYSICAL EXAMINATION:  General:  This is a pleasant 51 year old [place]  American male who is in no acute distress who is alert, oriented, and  cooperative.  Vital Signs:  BP is 127/81, pulse rate 84, respirations  18, O2 sat 96% on room air.  Cardiovascular:  Regular rate and rhythm.  Lungs:  Clear to auscultation on the left, slightly diminished at the  right base otherwise clear.  No rales, wheezes, or rhonchi.  All of the  right chest tube wounds are clean, dried, and well healed.   DIAGNOSTIC TESTS:  There was no chest x-ray obtained today.   IMPRESSION AND PLAN:  The patient continues with occasional right chest  wall pain.  He has been given a prescription for Ultram.  The patient  was instructed to take 1-2 tablets every 4-6 hours as needed for pain.  He was given #40 with no refills.  In addition, he was instructed if he  has persistent or worsening shortness of breath he is to be evaluated as  soon  as possible.  He already has an appointment to see Dr. Laneta Simmers in a  couple of weeks.  We will keep this appointment.  A chest x-ray will be  obtained prior to this appointment.  The patient was instructed to call  our office for any further problems or concerns and hopefully the Ultram  will assist him in alleviating his pain.  The only change to his  previous medications are that he is no longer taking ritonavir according  to the wife.   Evelene Croon, M.D.  Electronically Signed   DZ/MEDQ  D:  12/15/2008  T:  12/16/2008  Job:  [id number]

## 2010-06-22 NOTE — Assessment & Plan Note (Signed)
OFFICE VISIT   William [name], William [name]  DOB:  1961-01-24                                        February 10, 2009  CHART #:  [id number]   The patient returned to my office today for followup status post right  VATS stapling of multiple blebs and parietal pleurectomy for recurrent  spontaneous right pneumothorax on October 08, 2008.  He said that he  still has occasional pains in the right lateral chest wall, but  otherwise is feeling well.  He has had no shortness of breath.   On physical examination today, blood pressure 117/77, pulse is 70 and  regular, respiratory rate is 18 unlabored, and saturation is 100% on  room air.  He looks well.  Lung exam is clear.  The incisions are all  well healed.   A chest x-ray today shows no pneumothorax.  There are stable  emphysematous changes throughout both lungs.  There are no effusions.   IMPRESSION:  The patient is doing well.  I reassured him that his chest  wall pain will gradually improve, because I think this is probably  related to the surgery.  He said that it does not bother him very much.  I told him I did not think another chest x-ray was needed unless he  develops recurrent shortness of breath.  He will follow up with his  primary physician.   Evelene Croon, M.D.  Electronically Signed   BB/MEDQ  D:  02/10/2009  T:  02/10/2009  Job:  [id number]

## 2010-06-22 NOTE — Op Note (Signed)
NAMEKELVYN, SCHUNK             ACCOUNT NO.:  [id number]   MEDICAL RECORD NO.:  [id number]          PATIENT TYPE:  INP   LOCATION:  2033                         FACILITY:  [facility]   PHYSICIAN:  Ines Bloomer, M.D. DATE OF BIRTH:  1960-07-16   DATE OF PROCEDURE:  DATE OF DISCHARGE:                               OPERATIVE REPORT   PREOPERATIVE DIAGNOSIS:  Right tension pneumothorax.   POSTOPERATIVE DIAGNOSIS:  Right tension pneumothorax.   OPERATION PERFORMED:  Insertion of right chest tube.   SURGEON:  Ines Bloomer, MD   FIRST ASSISTANT:  Rowe Clack, PA-C   ANESTHESIA:  General with 5 mg of Versed and 1% Xylocaine.   DESCRIPTION:  The patient was given 5 mg of Versed.  Right chest was  prepped and draped in the usual sterile manner.  The patient had 100%  pneumothorax on the right side with some mediastinal shift.  The area  was infiltrated with 1% Xylocaine at the sixth intercostal space and a  small incision was made and two chest tube stitches were placed.  Dissection was carried down and the chest tube was  inserted without  difficulty with evacuation of air.  It was sutured in place with a 0  silk.  Dry sterile dressing applied and connected to a [place] drainage.  The patient tolerated the procedure well and will be admitted for  further treatment.      Ines Bloomer, M.D.  Electronically Signed     DPB/MEDQ  D:  09/30/2008  T:  10/01/2008  Job:  [id number]

## 2010-06-22 NOTE — H&P (Signed)
[name], GEISINGER             ACCOUNT NO.:  [id number]   MEDICAL RECORD NO.:  [id number]          PATIENT TYPE:  INP   LOCATION:  2033                         FACILITY:  [facility]   PHYSICIAN:  Ines Bloomer, M.D. DATE OF BIRTH:  03-26-1960   DATE OF ADMISSION:  09/30/2008  DATE OF DISCHARGE:                              HISTORY & PHYSICAL   CHIEF COMPLAINT:  Shortness of breath.   HISTORY OF PRESENT ILLNESS:  This patient is a 50 year old black male,  who started with progressive shortness of breath over approximately the  past 3 days.  On today's date, he presented to the emergency department  where he was found to have a right-sided large pneumothorax with  evidence of tension on chest x-ray.  He was seen promptly by Dr. Edwyna Shell  and a chest tube was placed.  He was felt to require admission for  further evaluation and treatment.  The patient reportedly has had some  difficulties with cough which is nonproductive, but other than that his  only symptoms have been shortness of breath with dyspnea on exertion  which has been progressive.  The patient had no history of previous  pneumothorax.  He has had no recent trauma.  He has a long-standing use  of cigarettes.   Past medical history includes:  1. HIV positive.  2. Anxiety disorder and bipolar disorder.  3. Reportedly hospitalized around 6-7 years ago at [facility] in a coma.  The details of this are not available.   PAST SURGICAL HISTORY:  He had a drainage of a right intergluteal cleft  abscess in 2006.  Otherwise, denies previous surgeries.   ALLERGIES:  No known drug allergies.   MEDICATIONS:  His wife is going to bring in his medications.  She  reports that they are the following:  1. Truvada q.a.m., dosage unknown.  2. Lexiva twice daily, dosage unknown.  3. He also takes Bactrim b.i.d.   REVIEW OF SYSTEMS:  See the history of the present illness for pertinent  positives and negatives, otherwise  unremarkable.   SOCIAL HISTORY:  He is married.  He does have a previous history of use  of street drugs, although he and his family are not specific as to this.  He has used approximately 1 pack per day of cigarettes for 30 years.  He  has a history of heavy alcohol use in the past, but has quit.   Family history is remarkable for diabetes in his mother.   PHYSICAL EXAMINATION:  VITAL SIGNS:  Blood pressure 117/82, heart rate  82, respirations 16.  GENERAL APPEARANCE:  A 50 year old black male in obvious discomfort and  somewhat dyspneic prior to chest tube placement.  HEENT:  Normocephalic, atraumatic.  Pupils are equal, round, reactive.  Extraocular movements are intact.  Sclerae are muddy.  He has fair  dentition.  Oral mucosa is pink and moist.  NECK:  Supple.  No jugular venous distention.  No lymphadenopathy.  PULMONARY:  Significant diminished breath sounds on the right prior to  chest tube placement.  This improved following  chest tube placement.  Breath sounds are overall course to a degree bilaterally.  CARDIAC:  Regular rate and rhythm.  No murmurs, no gallops, no rubs.  ABDOMEN:  Soft, nontender, and nondistended.  No masses, no  hepatosplenomegaly.  GENITOURINARY AND RECTAL:  Deferred.  EXTREMITIES:  No edema.  Peripheral pulses are palpable.  NEUROLOGICAL:  Nonfocal.  Alert and oriented x4.   ASSESSMENT:  A large right spontaneous pneumothorax in a smoker.   PLAN:  As indicated, he had a chest tube placed by Dr. Edwyna Shell and we  will follow in a routine manner.  I have asked his wife to bring in his  medications so we can start him back on his antivirals.      Rowe Clack, P.A.-C.      Ines Bloomer, M.D.  Electronically Signed    WEG/MEDQ  D:  09/30/2008  T:  10/01/2008  Job:  [id number]   cc:   [name], M.D.

## 2010-06-25 NOTE — Consult Note (Signed)
NAMERUSSEL, MORAIN             ACCOUNT NO.:  [id number]   MEDICAL RECORD NO.:  [id number]          PATIENT TYPE:  INP   LOCATION:  6731                         FACILITY:  [facility]   PHYSICIAN:  Sandria Bales. Ezzard Standing, M.D.  DATE OF BIRTH:  [birth year]   DATE OF CONSULTATION:  03/19/2004  DATE OF DISCHARGE:                                   CONSULTATION   HISTORY OF THE PRESENT ILLNESS:  Mr. Cybulski is a 50 year old male patient  with a three-year history of rectal and scrotal pain as well as associated  bloody diarrhea.  He was admitted and was noted to have a perineal abscess.  CT scan reveals this abscess also tracks upwards into the scrotum and into  the base of the penis.  He has been placed on IV Zosyn.  We are consulted to  see if we can drain this lesion.   He has been non compliant with his HIV meds, and therefore this is probably  contributing to him being immunocompromised.   PAST MEDICAL HISTORY:  HIV.   ALLERGIES:  None.   MEDICATIONS:  1.  Kaletra 3 tablets p.o. b.i.d.  2.  _________ 300 mg a day.  3.  Ziagen 300 mg b.i.d.   SOCIAL HISTORY:  Smokes less than a pack a day.  Former alcohol use, but he  denies any alcohol use at this point.  No illicit drug use.  He is single.   FAMILY HISTORY:  Mother with hypertension.  Father with myocardial  infarction and stroke.   LABORATORY DATA:  Laboratories reveal sodium of 131, potassium 2.8, BUN 8  and creatinine 0.8. Hemoglobin 11.8, hematocrit 34.2 and white 8.9.  PT  13.7, INR 1.1.   PHYSICAL EXAMINATION:  VITAL SIGNS:  Temperature 100.8, pulse 84,  respirations 20, blood pressure 113/67, and O2 saturation is 99% on room  air.  HEENT:  Normal.  NECK:  No carotid bruits.  No JVD or thyromegaly.  CHEST:  The is chest is clear to auscultation bilaterally.  No wheezing or  rhonchi heard.  HEART:  Regular rate and rhythm.  No evidence of murmur or rapid beat.  ABDOMEN: The abdomen is with active bowel sounds, nontender  and  nondistended.  No masses.  No bruits.  GENITALIA:  The perineum has a fluctuant mass between the scrotum and the  rectum tracking upwards towards the testicles.  The testicles are soft and  nontender.  There is a small open sore just over this fluctuant mass and  it  does reveal some slightly thin bloody drainage.  If feel no rectal mass, nor  do I see a perirectal infection.  NEUROLOGIC EXAMINATION:  Cranial nerves II-XII are grossly intact.   ASSESSMENT AND PLAN:  1.  Perineal abscess.   We will incise and drain this at the bedside.  We will give IV antibiotics  an see if this clears the entire infection.  Dressing for the affected area  will be  saline gauze.   1.  Human immunodeficiency virus.  2.  Hypokalemia, which is being replaced.  3.  Probable severe immunocompromised secondary  to his non compliance/HIV.   The patient was seen and examined by Dr. Ovidio Kin.      LB/MEDQ  D:  03/19/2004  T:  03/20/2004  Job:  [id number]

## 2010-06-25 NOTE — Op Note (Signed)
William [name], LANDI             ACCOUNT NO.:  [id number]   MEDICAL RECORD NO.:  [id number]          PATIENT TYPE:  INP   LOCATION:  6731                         FACILITY:  [facility]   PHYSICIAN:  Sandria Bales. Ezzard Standing, M.D.  DATE OF BIRTH:  09-13-1960   DATE OF PROCEDURE:  03/19/2004  DATE OF DISCHARGE:                                 OPERATIVE REPORT   PREOPERATIVE DIAGNOSIS:  Perineal abscess.   POSTOPERATIVE DIAGNOSIS:  Perineal abscess that tracks into scrotum.   PROCEDURE:  Incision and drainage of perineal abscess at bedside.   SURGEON:  Sandria Bales. Ezzard Standing, M.D.   ANESTHESIA:  10 mL of 1% Xylocaine.   COMPLICATIONS:  None.   INDICATION FOR PROCEDURE:  Patient is a 50 year old black male who has HIV  but noncompliant with his meds.  Apparently, his CD4 count is 14 and he has  a very heavy viral load of over 7,000,000.  While in the room I am going to  try an incision and drainage of perineal abscess.   OPERATIVE NOTE:  Patient in prone position, his perineum was prepped with  Betadine solution and sterilely draped.  The skin was infiltrated about 10  mL of 1% Xylocaine and then a linear incision approximately 3-4 cm then made  into this abscess to open it up more widely so it will drain better.   I placed a finger, it was tracked up towards the scrotum.   I think Mr. Granja has a perineal abscess which tracks up the scrotum.  I  am not sure if this bedside drainage will be adequate and I think we will  just have to see how he does symptomatically.  Certainly, his complicated  and compromised immune status will make this healing long term.      DHN/MEDQ  D:  03/19/2004  T:  03/20/2004  Job:  [id number]   cc:   Gertha Calkin, M.D.   Cliffton [name], M.D.  [address] [place]  [place] [address]  Fax: [phone]

## 2010-06-25 NOTE — Discharge Summary (Signed)
William [name], William [name]             ACCOUNT NO.:  [id number]   MEDICAL RECORD NO.:  [id number]          PATIENT TYPE:  INP   LOCATION:  6731                         FACILITY:  [facility]   PHYSICIAN:  Jonna L. Robb Matar, M.D.DATE OF BIRTH:  [birth year]   DATE OF ADMISSION:  03/19/2004  DATE OF DISCHARGE:  03/22/2004                                 DISCHARGE SUMMARY   PRIMARY CARE PHYSICIAN:  [name], M.D. at [facility].   CONSULTATIONS:  Sandria Bales. Ezzard Standing, M.D. for surgery, Cliffton [name], M.D. for  infectious disease.   FINAL DIAGNOSES:  1.  Perineal abscess.  2.  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  3.  Hypokalemia.   PROCEDURES:  Incision and drainage of rectal abscess on February 10.   ALLERGIES:  None.   CODE STATUS:  Full.   HISTORY:  This is a 50 year old African-American male with a history of  medical noncompliance, presumed PCP, alcohol and tobacco abuse.  He most  recently in October had a CD 4 count of less than 10 and a high viral load.  He presented with 3 days of abdominal pain, bloody diarrhea, and rectal pain  with fevers and chills.  He has been Kaletra, Viread, and Ziagen but not  consistently and had not been taking any prophylactic antibiotics.   HOSPITAL COURSE:  The patient was put on Zosyn, back on medications.  His  potassium of 3.3 was replaced.  He was seen in consultation by surgery who  opened up the abscess with an I&D and the patient continued to do well.  Laboratory work included negative C. difficile and AFB, hemoglobin of 10.4,  white count of 5.2, negative blood cultures.  CD-4 count of 10.   DISPOSITION:  The patient will be discharged today to follow up with surgery  in 2 weeks and with Dr. Philipp Deputy in [facility] within the month.  His  antivirals will be Tenofovir 300 daily, Abacavir 300 b.i.d., Kaletra 200/50  two pills b.i.d.  His antibiotics will be Bactrim daily, azithromycin 1200  mg every Friday, and  Augmentin 875 mg b.i.d. for 2 weeks.  He will pack and  dress the abscess as it was done in the hospital.  He is being reminded to  take his medications every single day.  He has been given prescriptions for  all of them which he is to refill at [facility] with the exception of the  two weeks of Augmentin.   CONDITION:  Satisfactory.   Forty minutes were spent.      JLB/MEDQ  D:  03/22/2004  T:  03/22/2004  Job:  [id number]   cc:   [name], M.D.  [phone]  [place] [address]  Fax: [phone]   Sandria Bales. Ezzard Standing, M.D.  [address], [address]  [place]  [place] [address]   Cliffton [name], M.D.  [address] [place]  [place] [address]  Fax: [phone]

## 2010-06-25 NOTE — Discharge Summary (Signed)
NAMEROLEN, CONGER             ACCOUNT NO.:  [id number]   MEDICAL RECORD NO.:  [id number]          PATIENT TYPE:  IPS   LOCATION:  0507                          FACILITY:  [facility]   PHYSICIAN:  Jasmine Pang, M.D. DATE OF BIRTH:  [birth year]   DATE OF ADMISSION:  08/21/2006  DATE OF DISCHARGE:  08/28/2006                               DISCHARGE SUMMARY   IDENTIFICATION:  [age]-year-old married African-American male who  was admitted on a voluntary basis on August 21, 2006.   HISTORY OF PRESENT ILLNESS:  The patient was referred by [facility],  where he presented for alcohol treatment, but had no beds.  He requested  detox from alcohol.  He was drinking at least six beers daily.  He was  also using cocaine.  There was positive auditory hallucinations with  voices mumbling a power struggle in his head.  Getting commands  telling me to do things I shouldn't do.  He is endorsing auditory  hallucinations with commands for self-harm.  He is having severe marital  conflict which is his primary stressor.  He is currently seen at [facility] for treatment.  This is his first [facility] admission.  he has a  history of auditory hallucinations in the 1970s.  His first  hospitalization was in 1978 at [place].  He took Mellaril and Thorazine in  the past.  He has been on no meds since the early 1990s.  He states he  has been diagnosed both as schizophrenia and bipolar.  He currently is  HIV-positive and is on Norvir 100 mg p.o. b.i.d., Truvada 200 mg to 300  mg in the morning and Lexiva 700 mg b.i.d.  He has no known drug  allergies.   PHYSICAL FINDINGS:  Physical exam was done in the ED prior to admission.  There were no acute physical or medical problems.  Admission  laboratories were done in the ED prior to admission.  There were  reviewed by the ED physician and there was no reported problem or  abnormality.   HOSPITAL COURSE:  Upon admission, the patient was started on his Norvir  100 mg  p.o. b.i.d., Truvada 200 mg to 300 mg  p.o. daily in the morning,  Lexiva 700 mg p.o. b.i.d.  He was also started on a low-dose Librium  protocol for alcohol dependence.  He was started on Symmetrel 100 mg  p.o. b.i.d. for cocaine.  On August 22, 2006, the patient was given  Zyprexa 5 mg p.o. now, and  Zyprexa 5 mg p.o. q.a.m. and nightly as well  as 5 mg p.o. q.6 hours p.r.n. hallucinations.  He tolerated these  medications well with no significant side effects.  He was friendly and  cooperative.  He states the voices are with him all the time.  He  described command hallucinations (scary).  He went to [facility] the  day before admission for longer-term treatment, but they were full and  sent him here.  As hospitalization progressed, the patient was able to  participate appropriately in unit therapeutic groups and activities.  His mental status improved.  He became  less depressed with affect wider  range.  He continued to have some sort of auditory hallucinations,  mumbling in the background.  He stated he had a supportive wife,  mother and siblings.  His wife had wanted him to come in for treatment.  The patient stated that his wife was also on drugs and she makes him  upset.  He stated he wanted to separate from her.  The patient's mental  status continued to improve.  He was irritable at times but overall  cooperative.  On August 28, 2006, mental status had improved markedly from  admission status.  The patient was friendly and cooperative with good  eye contact.  Speech normal rate and flow.  Psychomotor activity within  normal limits.  Mood euthymic.  Affect wide range.  There was no  suicidal or homicidal ideation.  No thoughts of self-injurious behavior.  No auditory or visual hallucinations, and no paranoia or delusions.  Thoughts were logical and goal-directed.  Thought content no predominant  theme.  Cognitive was grossly within normal limits and back to baseline.  It was felt the  patient was safe to be discharged today.  In spite of  stating he wanted to separate from his wife, he decided to return home  to live with her.   DISCHARGE DIAGNOSES:  AXIS I:  Mood disorder not otherwise specified.  Polysubstance dependence.  AXIS II:  None.  AXIS III:  Human immunodeficiency virus-positive.  AXIS IV:  Severe (issues with marital conflict, problems related to  social environment, other psychosocial problems, burden of psychiatric  illness, burden of medical illness).  AXIS V:  Global assessment of functioning upon discharge was 50.  Global  assessment of functioning upon admission was 25.  Global assessment of  functioning highest past year 20.   DISCHARGE PLANS:  There were no specific activity level or dietary  restrictions.   POST-HOSPITAL CARE PLANS:  The patient will see Alona Bene at  [facility] on Tuesday, July 22, at 2 p.m.  He was also given the  number for [facility] to be able to participate in their programs.   DISCHARGE MEDICATIONS:  1. Norvir 100 mg twice a day.  2. Truvada 200/300 mg once a day (take according to Dr. Danella Deis      instructions).  3. Lexiva 700 mg twice daily.  4. Zyprexa 5 mg the morning and at bedtime.  5. Symmetrel 100 mg twice a day.   In addition, he was on the waiting list for [facility], and they are  going to contact him when they have an opening.      Jasmine Pang, M.D.  Electronically Signed     BHS/MEDQ  D:  09/26/2006  T:  09/26/2006  Job:  [id number]

## 2010-06-25 NOTE — Consult Note (Signed)
[name], [name]             ACCOUNT NO.:  [id number]   MEDICAL RECORD NO.:  [id number]          PATIENT TYPE:  EMS   LOCATION:  ED                           FACILITY:  [facility]   PHYSICIAN:  Sandria Bales. Ezzard Standing, M.D.  DATE OF BIRTH:  [birth year]   DATE OF CONSULTATION:  07/20/2004  DATE OF DISCHARGE:                                   CONSULTATION   REASON FOR CONSULTATION:  Perineal intragluteal abscess.   HISTORY OF PRESENT ILLNESS:  Mr. William [name] is a 50 year old black male who  actually I happened to have seen in February of 2006 for a perineal abscess.  He apparently had not been taking his medicines well. His CD4 had tripped  dramatically low at that time.  He says he has been on and off his medicines  since that time.  He thinks he feels better than he did back in February.   Anyway, he had called over the [facility] ER.  They said they were busy or full so  he came over the [facility] Emergency Room and I just happened to be on  call.   Since I have seen him once in the office since he was in the hospital when  he seemed to be improving markedly and this all came up in the last four to  six days.   ALLERGIES:  He has no allergies.   MEDICATIONS:  His medicines had been:  1.  Tenovofovir 300 mg daily.  2.  Abacavir 300 mg b.i.d.  3.  Kalectra 250 two tablets b.i.d.   REVIEW OF SYSTEMS:  HIV/acquired immune deficiency syndrome.  I am not sure  that his CD4 counts are on the labs that we drew today.  He otherwise is  fairly healthy, unemployed, disabled from his HIV and followed up at  [facility].   PHYSICAL EXAMINATION:  VITAL SIGNS: Temperature 99.3, blood pressure 123/75,  pulse 84, respirations 20.  GENERAL:  He is a well-nourished, black male.  LUNGS:  Clear to auscultation.  HEART:  Regular rate and rhythm.  ABDOMEN:  Soft.  On his perineum he has an inflamed and indurated area just  to the right of his intragluteal clef, up high and then along the base of  his  scrotum. Along his perineum he has about a 3 cm hard, indurated mass.  Both of these are consistent with abscesses.   DIAGNOSES:  1.  Intragluteal abscess/perineal abscess. Plan incision and drainage in the      emergency room.  2.  Human immunodeficiency virus.  3.  Question how well he is taking his medicines.  Apparently he is having      some of these medications changed around.       DHN/MEDQ  D:  07/20/2004  T:  07/20/2004  Job:  [id number]   cc:   [name], M.D.  [phone] [address]  [place] [address]  Fax: [phone]   Cliffton [name], M.D.  [address] [place]  [place] [address]  Fax: [phone]

## 2010-06-25 NOTE — Discharge Summary (Signed)
William [name], William [name]             ACCOUNT NO.:  [id number]   MEDICAL RECORD NO.:  [id number]          PATIENT TYPE:  INP   LOCATION:  0368                         FACILITY:  [facility]   PHYSICIAN:  Hollice Espy, M.D.DATE OF BIRTH:  Dec 28, 1960   DATE OF ADMISSION:  11/19/2003  DATE OF DISCHARGE:  12/15/2003                                 DISCHARGE SUMMARY   The patient has no primary care doctor.   CONSULTANTS ON THIS CASE:  1.  Dr. Orvan Falconer, infectious disease.  2.  Dr. Shelle Iron, pulmonary.  3.  Dr. Jeanie Sewer, psychiatry.   DISCHARGE DIAGNOSES:  1.  Human immunodeficiency virus and acquired immune deficiency syndrome      previously known.  2.  Medical noncompliance.  3.  Acquired immune deficiency syndrome related dementia, and patient shows      no signs of competency.  4.  History of tobacco abuse.  5.  Pneumonia, suspected PCP.  6.  Acute respiratory failure, ventilator dependent, started October 15,      extubated October 29.  7.  Dehydration.  8.  Secondary azotemia, now resolved.  9.  Mild colonic ileus, resolved.  10. Diabetes mellitus.   DISCHARGE MEDICATIONS:  1.  Prednisone 20 mg p.o. daily to be stopped on December 17, 2003.  2.  Diflucan 100 mg p.o. daily.  3.  Insulin sliding scale with Novolog insulin.  For blood sugars of 60-100,      no insulin; 101-150, 2 units; 151-200, 3 units; 201-250, 6 units; 251-      300, 9 units; 301-350, 12 units, plus sugars are to be checked 3 times a      day plus nighttime coverage.  Nighttime coverage is 201-250, 2 units;      251-300, 3 units; 301-350, 4 units; greater than 351, 5 units.  4.  Protonix 40 daily.  5.  Ensure 1 can p.o. t.i.d. with meals.  6.  Tylenol 650 p.o. q.6h. p.r.n.  7.  Zithromax 500 mg p.o. daily.  8.  Septra DS 1 p.o. daily p.r.n.  9.  Ethambutol 1.2 g p.o. daily.   HOSPITAL COURSE:  The patient is a 50 year old African-American male with  past medical history of diagnosis of HIV, who has been  noncompliant with all  of his medications.  He does not follow up.  He was diagnosed approximately  2 years ago.  He complained of some generalized weakness, cough, and  vomiting for the past 1 week prior to coming in, and the cough is  nonproductive but his shortness of breath, however, he noted was worsening  over the past few days.  He has had no other complaints.  He came to the  emergency room for evaluation.  At that time, he was noted to have a  slightly elevated temp of 99.2.  He had some oxygen desaturation with 95% on  4 L.  His chest x-ray was done and showed some bilateral lower lobe  infiltrates; however, in addition, he had an 84% shift on his CBC.  His  white count was 6.8.  The rest of his lab work  was unremarkable.  He was  admitted for bilateral pneumonia, suspected to be PCP pneumonia.  The  patient's last CD4 count was noted to be 250.  I am not sure when this was  last checked.  IV Bactrim as well as steroids and Avelox were all started.  The patient was noted to be slightly volume depleted and was started on IV  fluids as well.  Infectious disease was consulted and saw the patient on  November 21, 2003.  It was felt that patient likely had a PCP pneumonia.  The  recommended  in addition adding Zithromax for MAC prophylaxis.  On November 21, 2003, the patient was noted to have some oxygen desaturations with O2  saturations as low as in the low 80s% with labored breathing.  A portable  chest x-ray was obtained, and patient was transferred down to a step-down  bed.  Pulmonary critical care, Dr. Shelle Iron, was consulted for assistance.  It  was felt that the patient was having some acute respiratory failure issues  from his PCP.  He was having increased work of breathing.  He started on  oxygen nebulizers and closer monitoring in case he needed to be intubated  and placed on a ventilator.  On November 22, 2003, the patient was continuing  to have problems with oxygen  desaturation with saturations dropping as low  as 74%.  At this point, it was felt that more invasive therapy was needed,  and patient was intubated and started on a ventilator.  He was noted to have  a temperature of 103.3.  He was also noted to be slightly hypotensive with a  blood pressure of 80/52.  The patient's CD4 count had returned by this point  and noted to be less than 10, strongly felt that he had PCP, although no  cultures were able to be obtained.  Nutrition services had patient started  on tube feeds by a Panda tube which the patient tolerated.  The patient's  subsequent blood cultures during his intubation time were unremarkable.  Some sputum cultures obtained were noted to have no acid fast bacilli yeast  or fungus.  Moderate amount of white blood cells were seen but no organisms.  No growth was noted.  His urine remained clear as well.  The patient  remained on the ventilator for approximately 2 weeks.  During that time,  infectious disease continued to follow him.  He remained on steroids,  Bactrim, Avelox, Zithromax as well.  He started on insulin to adjust for  higher blood sugars.  He was, on November 28, 2003, noted to have a mod  colonic ileus by his abdominal x-ray.  His tube feeds were held, and Reglan  was given.  This ileus quickly resolved 1-2 days later.  In addition, he was  started on Diflucan for fungal prophylaxis.  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  Infectious  disease had followed this and likely was felt that since this was a positive  test; histoplasmosis is rare in [place], and patient was not felt to  have histoplasmosis.  They continued to follow him as well.  A Candida  albicans sputum culture reported on October 18 but otherwise was  unremarkable again.  It was of note that the patient's histoplasmosis  antigen was 0.29 units with a result of less than 1 actually being negative which confirmed  ID's feeling that this was not histoplasmosis.  More than  likely,  again, that this was PCP.  The patient was continued to be treated  on the ventilator.  He was felt to have made some minimal progress.  The  patient's sedation was decreased on December 04, 2003.  He was doing  relatively well and was having more awakening.  He was extubated on December 06, 2003.  At this point, he was more awake.  He had an adequate cough and  was more stable.  At this point, he was transferred back to the [facility].  He was transferred up to the telemetry bed on December 08, 2003.  He remained stable; however, he remained quite confused and very  weak.  He would be quite belligerent at times with the nurses and  noncompliant.  He would try to get out of bed and would immediately fall  over despite multiple attempts to tell him to not get out of bed.  He said  he would not and then would immediately try to do so.  He was felt to be too  weak to care for himself.  Dr. Jeanie Sewer from psychiatry was consulted on  December 11, 2003.  After examining and meeting with the patient, he felt the  patient did not have enough mental capacity to make any type of care or  consent for himself.  Discussion with the patient's family, they felt that  they were unable to care for him significantly at home.  He previously had  been at [facility].  Attempts were made to see if patient  would be eligible or appropriate for [facility].  [facility] felt that they would not be  able to take the patient.  So, at this time, the plan will be to transfer  patient to either a nursing or Hospice care center and make him comfortable.  The last several days he has been somewhat compliant, taking some of his  medications, but again, has not remained very compliant in terms of staying  in bed.  He works minimally with rehab.  By December 15, 2003, the patient  will have completed 21 days of Bactrim therapy.  He can then  resume on an  old prophylactic dose of Bactrim.  He will continue on his other antibiotics  as well as a steroid taper.  Where he will finally go will be dependent on a  facility that will take him.  He continues to remain hypoxic with his O2  saturations falling down into the mid 80s without oxygen.  It is not known  if he will continue to improve, especially since he is not able to use a  spirometer.  We will continue all of his medications as scheduled.  Dr.  Orvan Falconer from infectious disease is trying to obtain [facility] records  about prior antiretroviral therapy to see if patient can may be started on  these.  The patient at times says that he wants to restart on his  medications.   The patient's discharge diet will be a low sodium diet.  His activity will  be essentially bed rest.  If he is able to participate in some therapy, it  would be very highly supervised.   DISPOSITION:  He is improved from his initial admission, although his baseline does have massive confusion in it.  Long-term, he may have a  limited prognosis.     Send   SKK/MEDQ  D:  12/13/2003  T:  12/14/2003  Job:  [id number]   cc:  Marcelyn Bruins, M.D. [facility]   Cliffton [name], M.D.  [address] [place]  [place] [address]  Fax: [phone]   Antonietta Breach, M.D.

## 2010-06-25 NOTE — H&P (Signed)
NAMELENARD, KAMPF             ACCOUNT NO.:  [id number]   MEDICAL RECORD NO.:  [id number]          PATIENT TYPE:  INP   LOCATION:  0103                         FACILITY:  [facility]   PHYSICIAN:  Kela Millin, M.D.DATE OF BIRTH:  [birth year]   DATE OF ADMISSION:  11/19/2003  DATE OF DISCHARGE:                                HISTORY & PHYSICAL   CHIEF COMPLAINT:  Generalized weakness, cough, and vomiting.   HISTORY OF PRESENT ILLNESS:  The patient is a 50 year old black male with a  past medical history significant for HIV diagnosed 2 years ago who presents  with the above complaints times about 6 days.  He states that the cough is  non-productive, and he has had progressive shortness of breath and nausea  and vomiting times 2 days.  The patient denies chest pain, hemoptysis,  hematemesis, hematochezia, dysuria, fevers, and no leg swelling.  He admits  to noncompliance with his HIV medications since February of 2005.  He is  usually followed at [facility].   The patient was evaluated in the emergency room, and the chest x-ray was  abnormal.  He is admitted to the [facility] for further  evaluation and management.   PAST MEDICAL HISTORY:  As above.   MEDICATIONS:  None at this time.  He does not remember the names of the ones  he was taking previously.   ALLERGIES:  No known drug allergies.   SOCIAL HISTORY:  Tobacco half pack per day.  Denies alcohol.  Also denies  illicit drug use.   FAMILY HISTORY:  His uncles had hypertension and diabetes.  His dad has had  an MI, and also a stroke.   REVIEW OF SYSTEMS:  As per HPI.  Other comprehensive review of systems  negative.   PHYSICAL EXAMINATION:  GENERAL:  The patient is a middle-aged black male in  no acute distress.  Nasal cannula O2 on.  VITAL SIGNS:  Temperature 99.2, blood pressure 113/78, pulse 107,  respiratory rate 20.  His O2 saturation was 95% on 4 liters O2.  HEENT:  Pupils equal, round and  reactive to light.  Extraocular movements  intact.  Sclerae are anicteric.  Slightly dry mucous membranes.  No oral  plaques noted.  There is a grayish, scaly macular rash noted on his face.  NECK:  Supple.  No adenopathy, no carotid bruits, and no thyromegaly.  LUNGS:  Decreased breath sounds at the bases.  Poor inspiratory effort.  No  wheezes appreciated.  CARDIOVASCULAR:  Tachycardic.  Normal S1 and S2.  Regular rhythm.  ABDOMEN:  Soft.  Bowel sounds present.  Nontender, nondistended.  No masses  palpable.  EXTREMITIES:  No cyanosis and no edema.  NEUROLOGIC:  Alert and oriented x3.  Cranial nerves II-XII grossly intact.  Nonfocal exam.   LABORATORY DATA:  Chest x-ray revealed bilateral lower lobe infiltrates.  White cell count of 6.8, hemoglobin 13.5, hematocrit 40.9, platelet count  277, neutrophils count 84%.  Sodium 132, potassium 3.7, chloride 98, CO2 of  27, glucose 102, BUN 20, creatinine 0.8, calcium 8.4, albumin 2.9.  AST is  39, ALT 16, alkaline phosphatase 48, total bilirubin 0.9.  Urinalysis  specific gravity is 1.034, with a pH of 5.5; otherwise, negative for  infection.   ASSESSMENT AND PLAN:  1.  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  The      patient is a 50 year old black male with human immunodeficiency virus      diagnosed in 2003, noncompliant with his anti-retrovirals since February      of 2005.  He thinks his last CD-4 count was 250, and he presents with      cough, shortness of breath, and symptoms as already discussed.  Chest x-      ray with bilateral infiltrates.  I will obtain sputum for silver      methenamine.  Stain for Pneumocystis carinii pneumonia also, Gram stain      and cultures.  I will start IV Bactrim.  If PO2 per ABG is less than 70,      will also start prednisone.  Will also cover with Avelox for possible      community acquired pneumonia.  The patient was also started on      antitussives and supplemental O2.  2.   Volume depletion/azotemia - will hydrate with IV fluids and follow.  3.  Human immunodeficiency virus.  Will check CD-4 count and viral load.      Will follow and consider outpatient ID follow up versus inpatient      consult.  The patient has been noncompliant with anti-retrovirals since      February, as already discussed.      ACV/MEDQ  D:  11/19/2003  T:  11/19/2003  Job:  [id number]   cc:   [facility]

## 2010-06-25 NOTE — Op Note (Signed)
NAMEJERRON, William [name]             ACCOUNT NO.:  [id number]   MEDICAL RECORD NO.:  [id number]          PATIENT TYPE:  EMS   LOCATION:  ED                           FACILITY[facility]  [facility]   PHYSICIAN:  Sandria Bales. Ezzard Standing, M.D.  DATE OF BIRTH:  [birth year]   DATE OF PROCEDURE:  07/20/2004  DATE OF DISCHARGE:                                 OPERATIVE REPORT   PREOPERATIVE DIAGNOSIS:  Right intragluteal cleft abscess, abscess at base  of perineum.   POSTOPERATIVE DIAGNOSIS:  Right intragluteal cleft abscess, abscess at base  of perineum.   OPERATION/PROCEDURE:  Incision and drainage of both abscesses.   SURGEON:  Sandria Bales. Ezzard Standing, M.D.   ANESTHESIA:  10 mL of 2% Xylocaine.   COMPLICATIONS:  None.   INDICATIONS FOR PROCEDURE:  William [name] is a 50 year old black male with is  HIV positive, has had a prior perineal abscess that I drained in February of  2006.  He has now come to the emergency room at [facility] with two more  abscesses.  I have no lab work on him.  He has been on and off his medicines  since I last saw him. It sounds like he is doing overall better with this  HIV medicine, though far from perfect.   DESCRIPTION OF PROCEDURE:  With the patient in a right lateral decubitus  position, I prepped the area between his right intragluteal cleft and his  perineum.  I infiltrated the skin with a total of about 10 mL of 2%  Xylocaine.  I made incisions into both abscesses.  The area high on his  intragluteal cleft was really more of a hard induration.  There is very  little purulent material that came out, but I do have this opened and  drained.  The area of the scrotum was probably about the size of a large  marble, about 2.5 to 3 cm in diameter.  I did get a fair amount of purulence  and I did obtain some cultures because of his history of HIV.   I cleaned them both with sterile gauze.  He will be discharged home today.  He will be given Vicodin for pain.  I will give him Septra DS,  one week  supply, as an antibiotic.  He will see me back in the office in two to three  weeks.  He is to keep his appointments for later this week for adjusting his  HIV medicine.  He knows to call in the interval problem.  He is going to do  sitz baths at home.       DHN/MEDQ  D:  07/20/2004  T:  07/20/2004  Job:  [id number]

## 2010-06-25 NOTE — H&P (Signed)
William [name], William [name]             ACCOUNT NO.:  [id number]   MEDICAL RECORD NO.:  [id number]          PATIENT TYPE:  INP   LOCATION:  6702                         FACILITY:  [facility]   PHYSICIAN:  Gertha Calkin, M.D.DATE OF BIRTH:  10/21/1960   DATE OF ADMISSION:  03/18/2004  DATE OF DISCHARGE:                                HISTORY & PHYSICAL   PRIMARY CARE PHYSICIAN:  [facility].   This is a 50 year old African-American male with 042, medical noncompliance,  history of presumed PCP, history of tobacco abuse, distant history of  ethanol abuse, who presents with approximately a three day history of  abdominal pain, diarrhea (bloody), rectal/scrotal pain.  Positive for fevers  and chills over this period of time.  He also claims night sweats; however,  denies cough, congestion, shortness of breath, or dyspnea on exertion.  Also  denies chest pain, current alcohol abuse.  No nausea, vomiting.  States he  has decrease of appetite secondary to abdominal pain, but no objective signs  of weight loss.   PAST MEDICAL HISTORY:  1.  042 diagnosed in 2003.  Most recent CD4 on computer shows less than 10      CD4 count in October of 2005 treated for a presumed PCP pneumonia.      Viral load at that point on that admission was greater than 100,000.  2.  Ongoing tobacco abuse.  3.  Distant history of alcohol abuse.  4.  Medical noncompliance.   MEDICATIONS:  1.  Kaletra three tablets p.o. b.i.d.  2.  Viread 300 mg p.o. daily.  3.  Ziagen 300 mg p.o. b.i.d.   ALLERGIES:  None.   FAMILY HISTORY:  Positive for his dad having a heart attack and stroke,  unable to tell me what age and otherwise no known family history.   SOCIAL HISTORY:  Apparently smokes two to three cigarettes down from half  pack of cigarettes since he was 50 years of age.  That puts him around 20  years history.  He also used to drink approximately 12-pack a day of beer  since he was 18.  Is not able to tell me when  he quit drinking but he states  he has done so for at least several years.  He denies any history of illicit  drug use.  He is single, has no kids.   REVIEW OF SYSTEMS:  Positive for rectal pain, scrotal tenderness and  swelling for approximately three to four days, fevers, chills, night sweats,  decreased appetite secondary to abdominal pain.  Otherwise, rest of review  of systems negative.  No blood transfusions.  Is heterosexual.  No recent  history of trauma.   PHYSICAL EXAMINATION:  VITAL SIGNS:  T max 101.5 in the ER, currently at the  time of dictation is 100.5, blood pressure 132/87, pulse 85, respirations  27.  He is saturating 99% on room air.  GENERAL:  This is a thin African-American male lying in mild distress in  bed.  HEENT:  Unremarkable.  CARDIOVASCULAR:  Regular rate and rhythm.  No murmurs, rubs, or gallops.  CHEST:  Clear  to auscultation bilaterally with good air movement.  NECK:  Supple without masses, JVP.  No lymphadenopathy noted.  ABDOMEN:  Diffuse tenderness.  No rebound, guarding.  Bowel sounds present.  No flank tenderness.  No organomegaly.  EXTREMITIES:  Without clubbing, cyanosis, edema.  Pulses are intact 2+,  symmetric upper and lower extremities.  SKIN:  Perineum area shows indurated area exquisitely tender to palpation.  There is a small open sore in the perineum area that is bleeding.  RECTAL:  Showed minimum stool in the vault.  No palpable masses.  Heme was  negative.  NEUROLOGIC:  Cranial nerves II-XII are intact.  No focal deficits.  Strength  is 5/5 bilaterally upper and lower extremities.  He is alert and oriented  x3.   LABORATORIES:  UA was positive only for 3-6 white blood cells, a few  epithelial, rare bacteria.  White count 9.2, hemoglobin 13.1, hematocrit  37.4, platelets 186.  Sodium 133, potassium 3.3, chloride 97, bicarbonate  27, glucose 95, BUN 10, creatinine 0.8, calcium 8.  Total protein 7.5,  albumin 3.6, AST 22, ALT 13,  alkaline phosphatase 52, total bilirubin 2.1.  CT abdomen and pelvis with contrast showed questionable gastroenteritis,  otherwise pelvic structures unremarkable.  No free fluid, free air, or  adenopathy present.  Normal pelvic CT.   ASSESSMENT:  1.  Perineal abscess.  2.  Diarrhea, bloody?  3.  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  4.  Tobacco abuse.  5.  Hypokalemia.  6.  Hyperbilirubinemia.  7.  Deep venous thrombosis/gastrointestinal prophylaxis.   PLAN:  Admit and start broad-spectrum antibiotics and a probable  immunoincompetent patient.  Will place him on IV D5 normal saline and try to  encourage p.o. intake.  Will consult ID and surgery in the a.m. as there is  no emergent issues at this time to help with management of the abscess as  well as tailoring his medications for his 042, as well as treatment for this  localized infection.  Check laboratories which include CD4, viral load,  replete his potassium.  Will fractionate the bilirubin as this is an unusual  pattern.  Follow up LFTs in the morning.  Will place him on nicotine patch  and counsel him on cessation.  Follow his hemoglobin and if this were to  drop may need to consult GI since he has never had endoscopy done.  Will  also Hemoccult his stools while he is in the hospital.  Will continue his  042 meds at this time and follow up with ID to see if they recommend any  changes.  Since there is question of possible blood loss, will place him on  PAS for prophylaxis, and also administer Pepcid for GI prophylaxis.  In  addition, will give him pain medicines and antiemetics to help with his  symptoms.      JD/MEDQ  D:  03/19/2004  T:  03/19/2004  Job:  [id number]   cc:   Dala Dock

## 2010-07-06 ENCOUNTER — Encounter: Payer: Self-pay | Admitting: Internal Medicine

## 2010-07-06 ENCOUNTER — Ambulatory Visit (INDEPENDENT_AMBULATORY_CARE_PROVIDER_SITE_OTHER): Payer: Medicare Other | Admitting: Internal Medicine

## 2010-07-06 ENCOUNTER — Other Ambulatory Visit: Payer: Self-pay | Admitting: Internal Medicine

## 2010-07-06 VITALS — BP 117/76 | HR 84 | Temp 98.1°F | Ht 71.0 in | Wt 149.5 lb

## 2010-07-06 DIAGNOSIS — B20 Human immunodeficiency virus [HIV] disease: Secondary | ICD-10-CM

## 2010-07-06 NOTE — Progress Notes (Signed)
  Subjective:    Patient ID: William [name], male    DOB: 1960-09-16, 50 y.o.   MRN: [id number]  HPI William [name] is in for his routine visit. He did not return for a purulence counseling after his last visit but states that he has not missed any doses of his medications. He has a current list of his medicines and can describe taking his 3 pills correctly. He is not using a pill box. He states that he is only smoking about 3 cigarettes daily but does not have a current plan to quit. He had been using Nicoderm patches but cannot afford them now.    Review of Systems     Objective:   Physical Exam  Constitutional: He appears well-developed and well-nourished. No distress.  HENT:  Mouth/Throat: Oropharynx is clear and moist. No oropharyngeal exudate.  Cardiovascular: Normal rate, regular rhythm and normal heart sounds.   No murmur heard. Pulmonary/Chest: Breath sounds normal. He has no wheezes.  Psychiatric: He has a normal mood and affect.          Assessment & Plan:

## 2010-07-06 NOTE — Assessment & Plan Note (Signed)
William [name]'s recent genotype did not show any significant resistance mutations. I am concerned that he may have not been taking his medications correctly. He will come in this Friday for adherence counseling. I will repeat his lab work today.

## 2010-07-07 LAB — T-HELPER CELL (CD4) - (RCID CLINIC ONLY)
CD4 % Helper T Cell: 4 % — ABNORMAL LOW (ref 33–55)
CD4 T Cell Abs: 60 uL — ABNORMAL LOW (ref 400–2700)

## 2010-07-07 LAB — HIV-1 RNA QUANT-NO REFLEX-BLD
HIV 1 RNA Quant: 59100 copies/mL — ABNORMAL HIGH (ref <20)
HIV-1 RNA Quant, Log: 4.77 {Log} — ABNORMAL HIGH (ref <1.30)

## 2010-07-12 NOTE — Progress Notes (Signed)
Addended by: Jennet Maduro D on: 07/12/2010 04:46 PM   Modules accepted: Orders

## 2010-07-13 LAB — HIV-1 RNA ULTRAQUANT REFLEX TO GENTYP+
HIV 1 RNA Quant: 61900 copies/mL — ABNORMAL HIGH (ref <20)
HIV-1 RNA Quant, Log: 4.79 {Log} — ABNORMAL HIGH (ref <1.30)

## 2010-07-20 LAB — HIV-1 GENOTYPR PLUS

## 2010-09-23 ENCOUNTER — Telehealth: Payer: Self-pay | Admitting: *Deleted

## 2010-09-23 DIAGNOSIS — Z79899 Other long term (current) drug therapy: Secondary | ICD-10-CM

## 2010-09-23 DIAGNOSIS — B20 Human immunodeficiency virus [HIV] disease: Secondary | ICD-10-CM

## 2010-09-23 DIAGNOSIS — Z21 Asymptomatic human immunodeficiency virus [HIV] infection status: Secondary | ICD-10-CM

## 2010-09-23 MED ORDER — SULFAMETHOXAZOLE-TMP DS 800-160 MG PO TABS: 1.0000 | ORAL_TABLET | Freq: Every day | ORAL | Status: DC

## 2010-09-23 MED ORDER — RALTEGRAVIR POTASSIUM 400 MG PO TABS: 400.0000 mg | ORAL_TABLET | Freq: Two times a day (BID) | ORAL | Status: DC

## 2010-09-23 MED ORDER — EMTRICITABINE-TENOFOVIR DF 200-300 MG PO TABS: 1.0000 | ORAL_TABLET | Freq: Every day | ORAL | Status: DC

## 2010-09-23 NOTE — Telephone Encounter (Signed)
Pt walked in to Center.  Needs return appt w/ MD and lab work.  Lab ordered placed and refills for medications sent to pharmacy.  Jennet Maduro, RN

## 2010-10-07 ENCOUNTER — Other Ambulatory Visit: Payer: Self-pay | Admitting: Internal Medicine

## 2010-10-07 ENCOUNTER — Other Ambulatory Visit (INDEPENDENT_AMBULATORY_CARE_PROVIDER_SITE_OTHER): Payer: Medicare Other

## 2010-10-07 ENCOUNTER — Other Ambulatory Visit: Payer: Self-pay | Admitting: Infectious Disease

## 2010-10-07 DIAGNOSIS — B20 Human immunodeficiency virus [HIV] disease: Secondary | ICD-10-CM

## 2010-10-07 DIAGNOSIS — Z79899 Other long term (current) drug therapy: Secondary | ICD-10-CM

## 2010-10-07 LAB — LIPID PANEL
Cholesterol: 128 mg/dL (ref 0–200)
HDL: 38 mg/dL — ABNORMAL LOW (ref >39)
LDL Cholesterol: 71 mg/dL (ref 0–99)
Total CHOL/HDL Ratio: 3.4 Ratio
Triglycerides: 96 mg/dL (ref <150)
VLDL: 19 mg/dL (ref 0–40)

## 2010-10-08 LAB — T-HELPER CELL (CD4) - (RCID CLINIC ONLY)
CD4 % Helper T Cell: 2 % — ABNORMAL LOW (ref 33–55)
CD4 T Cell Abs: 10 uL — ABNORMAL LOW (ref 400–2700)

## 2010-10-12 LAB — HIV-1 RNA ULTRAQUANT REFLEX TO GENTYP+
HIV 1 RNA Quant: 103000 copies/mL — ABNORMAL HIGH (ref <20)
HIV-1 RNA Quant, Log: 5.01 {Log} — ABNORMAL HIGH (ref <1.30)

## 2010-10-21 ENCOUNTER — Encounter: Payer: Self-pay | Admitting: Internal Medicine

## 2010-10-21 ENCOUNTER — Ambulatory Visit (INDEPENDENT_AMBULATORY_CARE_PROVIDER_SITE_OTHER): Payer: Medicare Other | Admitting: Internal Medicine

## 2010-10-21 VITALS — BP 102/68 | HR 71 | Temp 97.7°F | Resp 14 | Ht 72.0 in | Wt 151.8 lb

## 2010-10-21 DIAGNOSIS — B20 Human immunodeficiency virus [HIV] disease: Secondary | ICD-10-CM

## 2010-10-21 DIAGNOSIS — Z113 Encounter for screening for infections with a predominantly sexual mode of transmission: Secondary | ICD-10-CM

## 2010-10-21 DIAGNOSIS — Z79899 Other long term (current) drug therapy: Secondary | ICD-10-CM

## 2010-10-21 DIAGNOSIS — Z23 Encounter for immunization: Secondary | ICD-10-CM

## 2010-10-21 MED ORDER — AZITHROMYCIN 600 MG PO TABS: 1200.0000 mg | ORAL_TABLET | ORAL | Status: AC

## 2010-10-21 MED ORDER — FLUCONAZOLE 100 MG PO TABS: 100.0000 mg | ORAL_TABLET | ORAL | Status: AC

## 2010-10-21 NOTE — Assessment & Plan Note (Signed)
His viral load is over 100,000 and his CD4 count is down to 10. I've instructed him to restart his medications today along with the additional prophylactic medications Zithromax and Diflucan. I do not see any source for his pain and doubt that he has any significant injury. He does not appear to have pneumonia.

## 2010-10-21 NOTE — Progress Notes (Signed)
  Subjective:    Patient ID: William [name], male    DOB: October 04, 1960, 50 y.o.   MRN: [id number]  HPI William [name] is in for his routine visit. He states he been drinking heavily and continuing to smoke cigarettes and did not go to fill his medications earlier this summer. He is not taking any medications currently but states that he is going to the pharmacy today to pick them up. He has not had any fever and his appetite is good but he has been bothered by nonproductive cough recently and says that he woke up this morning with right-sided chest pain. He has not had any recent injury to his right chest.    Review of Systems     Objective:   Physical Exam  Constitutional: He appears well-developed and well-nourished. No distress.  HENT:  Mouth/Throat: Oropharynx is clear and moist. No oropharyngeal exudate.  Cardiovascular: Normal heart sounds.   No murmur heard. Pulmonary/Chest: Breath sounds normal. He has no rales.       He has healed incisions from his previous surgery on his right lung for a pneumothorax. His chest is nontender to palpation.  Abdominal: Soft. Bowel sounds are normal. There is no tenderness.  Skin: No rash noted.  Psychiatric: He has a normal mood and affect.          Assessment & Plan:

## 2010-10-25 LAB — HIV-1 GENOTYPR PLUS

## 2010-11-23 LAB — BASIC METABOLIC PANEL
BUN: 11
CO2: 28
Calcium: 9.4
Chloride: 103
Creatinine, Ser: 0.74
GFR calc Af Amer: >60
GFR calc non Af Amer: >60
Glucose, Bld: 78
Potassium: 4.4
Sodium: 136

## 2010-11-23 LAB — I-STAT 8, (EC8 V) (CONVERTED LAB)
Acid-Base Excess: 2
BUN: 12
Bicarbonate: 28.7 — ABNORMAL HIGH
Chloride: 105
Glucose, Bld: 74
HCT: 45
Hemoglobin: 15.3
Operator id: 151321
Potassium: 4.4
Sodium: 140
TCO2: 30
pCO2, Ven: 53.1 — ABNORMAL HIGH
pH, Ven: 7.341 — ABNORMAL HIGH

## 2010-11-23 LAB — RAPID URINE DRUG SCREEN, HOSP PERFORMED
Amphetamines: NOT DETECTED
Barbiturates: NOT DETECTED
Benzodiazepines: NOT DETECTED
Cocaine: NOT DETECTED
Opiates: NOT DETECTED
Tetrahydrocannabinol: NOT DETECTED

## 2010-11-23 LAB — DIFFERENTIAL
Basophils Absolute: 0.1
Basophils Relative: 2 — ABNORMAL HIGH
Eosinophils Absolute: 0.3
Eosinophils Relative: 6 — ABNORMAL HIGH
Lymphocytes Relative: 39
Lymphs Abs: 2
Monocytes Absolute: 0.6
Monocytes Relative: 11
Neutro Abs: 2.1
Neutrophils Relative %: 43

## 2010-11-23 LAB — CBC
HCT: 41.4
Hemoglobin: 13.6
MCHC: 32.9
MCV: 87.8
Platelets: 265
RBC: 4.71
RDW: 14.4 — ABNORMAL HIGH
WBC: 5.1

## 2010-11-23 LAB — TRICYCLICS SCREEN, URINE: TCA Scrn: NOT DETECTED

## 2010-11-23 LAB — ETHANOL: Alcohol, Ethyl (B): <5

## 2010-11-24 LAB — I-STAT 8, (EC8 V) (CONVERTED LAB)
BUN: 10
Bicarbonate: 25.5 — ABNORMAL HIGH
Chloride: 107
Glucose, Bld: 81
HCT: 40
Hemoglobin: 13.6
Operator id: 151321
Potassium: 3.8
Sodium: 140
TCO2: 27
pCO2, Ven: 45
pH, Ven: 7.361 — ABNORMAL HIGH

## 2010-11-24 LAB — RAPID URINE DRUG SCREEN, HOSP PERFORMED
Amphetamines: NOT DETECTED
Barbiturates: NOT DETECTED
Benzodiazepines: NOT DETECTED
Cocaine: NOT DETECTED
Opiates: NOT DETECTED
Tetrahydrocannabinol: NOT DETECTED

## 2010-11-24 LAB — POCT I-STAT CREATININE
Creatinine, Ser: 0.9
Operator id: 151321

## 2010-11-24 LAB — ETHANOL: Alcohol, Ethyl (B): <5

## 2011-01-11 ENCOUNTER — Other Ambulatory Visit: Payer: Medicare Other

## 2011-01-25 ENCOUNTER — Ambulatory Visit (INDEPENDENT_AMBULATORY_CARE_PROVIDER_SITE_OTHER): Payer: Medicare Other | Admitting: Internal Medicine

## 2011-01-25 ENCOUNTER — Encounter: Payer: Self-pay | Admitting: Internal Medicine

## 2011-01-25 ENCOUNTER — Other Ambulatory Visit: Payer: Self-pay | Admitting: Internal Medicine

## 2011-01-25 VITALS — BP 124/82 | HR 73 | Temp 97.4°F | Ht 71.0 in | Wt 160.0 lb

## 2011-01-25 DIAGNOSIS — B20 Human immunodeficiency virus [HIV] disease: Secondary | ICD-10-CM

## 2011-01-25 DIAGNOSIS — Z21 Asymptomatic human immunodeficiency virus [HIV] infection status: Secondary | ICD-10-CM

## 2011-01-25 LAB — COMPREHENSIVE METABOLIC PANEL
ALT: 90 U/L — ABNORMAL HIGH (ref 0–53)
AST: 90 U/L — ABNORMAL HIGH (ref 0–37)
Albumin: 4 g/dL (ref 3.5–5.2)
Alkaline Phosphatase: 55 U/L (ref 39–117)
BUN: 12 mg/dL (ref 6–23)
CO2: 26 mEq/L (ref 19–32)
Calcium: 9.1 mg/dL (ref 8.4–10.5)
Chloride: 104 mEq/L (ref 96–112)
Creat: 0.85 mg/dL (ref 0.50–1.35)
Glucose, Bld: 83 mg/dL (ref 70–99)
Potassium: 4.1 mEq/L (ref 3.5–5.3)
Sodium: 140 mEq/L (ref 135–145)
Total Bilirubin: 0.6 mg/dL (ref 0.3–1.2)
Total Protein: 7.6 g/dL (ref 6.0–8.3)

## 2011-01-25 MED ORDER — SULFAMETHOXAZOLE-TMP DS 800-160 MG PO TABS: 1.0000 | ORAL_TABLET | Freq: Every day | ORAL | Status: DC

## 2011-01-25 MED ORDER — RALTEGRAVIR POTASSIUM 400 MG PO TABS: 400.0000 mg | ORAL_TABLET | Freq: Two times a day (BID) | ORAL | Status: DC

## 2011-01-25 MED ORDER — ENSURE PO LIQD: 2.0000 | Freq: Two times a day (BID) | ORAL | Status: AC

## 2011-01-25 MED ORDER — EMTRICITABINE-TENOFOVIR DF 200-300 MG PO TABS: 1.0000 | ORAL_TABLET | Freq: Every day | ORAL | Status: DC

## 2011-01-25 NOTE — Progress Notes (Signed)
Patient ID: William [name], male   DOB: 11-27-1960, 50 y.o.   MRN: [id number]  INFECTIOUS DISEASE PROGRESS NOTE    Subjective: Terrance is in for his routine visit. He has some difficulty describing how he takes his medications but he states that he does not believe he is missed any doses since restarting them after his last visit a few months ago. He says he is feeling better and gaining weight. He is working up to call us see him helping set up for various programs. He states that he has been using his nicotine patches and has cut down to one cigarette daily. He has not called the quit line yet. He states that he has not been drinking any alcohol.  Objective:   General: He looks well Skin: No rash Lungs: Clear Cor: Regular S1 and S2 with no murmurs Abdomen: Nontender   Lab Results Lab Results  Component Value Date   WBC 3.3* 04/20/2010   HGB 14.3 04/20/2010   HCT 43.5 04/20/2010   MCV 87.2 04/20/2010   PLT 213 04/20/2010    Lab Results  Component Value Date   CREATININE 0.86 04/20/2010   BUN 10 04/20/2010   NA 139 04/20/2010   K 4.7 04/20/2010   CL 104 04/20/2010   CO2 26 04/20/2010    Lab Results  Component Value Date   ALT 73* 04/20/2010   AST 119* 04/20/2010   ALKPHOS 55 04/20/2010   BILITOT 0.4 04/20/2010      HIV 1 RNA Quant (copies/mL)  Date Value  10/07/2010 103000*  07/06/2010 59100*  07/06/2010 61900*  04/20/2010 37800*     CD4 T Cell Abs (cmm)  Date Value  10/07/2010 10*  07/06/2010 60*  04/20/2010 70*       Microbiology: No results found for this or any previous visit (from the past 240 hour(s)).  Studies/Results: No results found.   Assessment: Javarius appears to be doing better. I reviewed how to take his medications with him and instructed him to keep using his pillbox. I will check a. Lab work today to make sure that his viral load is suppressing.  I've encouraged him to call the quit line to review tips that we'll help him quit successfully and set a  quit date.  I've encouraged him to remain completely off of alcohol. I will recheck his liver enzymes today.  Plan: 1. Continue current medications. 2. Repeat lab work today. 3. Call cigarette quit line. 4. Maintain sobriety.  5. Return to clinic in 3 months   Cliffton [name], MD [facility] for Infectious Diseases [facility] [phone] pager   [phone] cell 01/25/2011, 9:43 AM

## 2011-01-26 LAB — T-HELPER CELL (CD4) - (RCID CLINIC ONLY)
CD4 % Helper T Cell: 2 % — ABNORMAL LOW (ref 33–55)
CD4 T Cell Abs: 20 uL — ABNORMAL LOW (ref 400–2700)

## 2011-01-27 ENCOUNTER — Other Ambulatory Visit: Payer: Self-pay | Admitting: Internal Medicine

## 2011-01-27 DIAGNOSIS — B20 Human immunodeficiency virus [HIV] disease: Secondary | ICD-10-CM

## 2011-01-27 LAB — HIV-1 RNA QUANT-NO REFLEX-BLD
HIV 1 RNA Quant: 48600 copies/mL — ABNORMAL HIGH (ref <20)
HIV-1 RNA Quant, Log: 4.69 {Log} — ABNORMAL HIGH (ref <1.30)

## 2011-02-02 LAB — HIV-1 GENOTYPR PLUS

## 2011-02-15 ENCOUNTER — Encounter: Payer: Self-pay | Admitting: Internal Medicine

## 2011-02-15 ENCOUNTER — Ambulatory Visit (INDEPENDENT_AMBULATORY_CARE_PROVIDER_SITE_OTHER): Payer: Medicare Other | Admitting: Internal Medicine

## 2011-02-15 VITALS — BP 114/77 | HR 71 | Temp 97.8°F | Ht 71.0 in | Wt 158.0 lb

## 2011-02-15 DIAGNOSIS — B20 Human immunodeficiency virus [HIV] disease: Secondary | ICD-10-CM

## 2011-02-15 NOTE — Progress Notes (Signed)
Patient ID: William [name], male   DOB: [birth year], 51 y.o.   MRN: [id number]  INFECTIOUS DISEASE PROGRESS NOTE    Subjective: William [name] is in for his followup visit. He now admits that he had been off of all of his medications until about 2 weeks ago. He states that he was having a lot of stress with a relationship with a male friend but he has cut that off and they are no longer seeing each other. He denies being sexually active. He can point out his medications on the chart but does not know their names and does not appear to be taking them correctly. It sounds like he is taking his Isentress once daily.  Objective:   General: He is well dressed and in good spirits Skin: No rash and his mouth is clear Lungs: Clear Cor: Regular S1 and S2 with no murmurs   Lab Results Lab Results  Component Value Date   WBC 3.3* 04/20/2010   HGB 14.3 04/20/2010   HCT 43.5 04/20/2010   MCV 87.2 04/20/2010   PLT 213 04/20/2010    Lab Results  Component Value Date   CREATININE 0.85 01/25/2011   BUN 12 01/25/2011   NA 140 01/25/2011   K 4.1 01/25/2011   CL 104 01/25/2011   CO2 26 01/25/2011    Lab Results  Component Value Date   ALT 90* 01/25/2011   AST 90* 01/25/2011   ALKPHOS 55 01/25/2011   BILITOT 0.6 01/25/2011       Microbiology: No results found for this or any previous visit (from the past 240 hour(s)).  Studies/Results: No results found.  HIV 1 RNA Quant (copies/mL)  Date Value  01/25/2011 48600*  10/07/2010 103000*  07/06/2010 59100*  07/06/2010 61900*     CD4 T Cell Abs (cmm)  Date Value  01/25/2011 20*  10/07/2010 10*  07/06/2010 60*    Assessment: His HIV is not well controlled do to problems with adherence. His genotype did not show any resistance mutations. He will come in later this week with all of his medications and his new pillbox to start treatment adherence counseling.  Plan: 1. Treatment adherence counseling 2. Return for a visit in 2 months after blood  work in 6 weeks   Cliffton [name], MD [facility] for Infectious Diseases [facility] [phone] pager   [phone] cell 02/15/2011, 11:59 AM

## 2011-02-18 ENCOUNTER — Telehealth: Payer: Self-pay | Admitting: *Deleted

## 2011-02-18 ENCOUNTER — Ambulatory Visit: Payer: Medicare Other

## 2011-02-18 NOTE — Telephone Encounter (Signed)
RN spoke with the pt he stated that he was on his way for the appt.  The pt did not show up for the appt.  Referral given to [facility] Counselor.  RN recommended that the pt be visited by [facility] Treatment Adherence RN.  All pertinent information given to [facility].

## 2011-02-24 ENCOUNTER — Telehealth: Payer: Self-pay | Admitting: *Deleted

## 2011-02-24 NOTE — Telephone Encounter (Signed)
Faxed documents plus referral to [facility] to f/u w/ patient for medication adherence.  CD4 and HIV VL counts decreasing.

## 2011-04-14 ENCOUNTER — Other Ambulatory Visit: Payer: Medicare Other

## 2011-04-14 DIAGNOSIS — B20 Human immunodeficiency virus [HIV] disease: Secondary | ICD-10-CM

## 2011-04-14 LAB — COMPREHENSIVE METABOLIC PANEL
ALT: 97 U/L — ABNORMAL HIGH (ref 0–53)
AST: 129 U/L — ABNORMAL HIGH (ref 0–37)
Albumin: 4.3 g/dL (ref 3.5–5.2)
Alkaline Phosphatase: 56 U/L (ref 39–117)
BUN: 10 mg/dL (ref 6–23)
CO2: 26 mEq/L (ref 19–32)
Calcium: 9.5 mg/dL (ref 8.4–10.5)
Chloride: 103 mEq/L (ref 96–112)
Creat: 0.86 mg/dL (ref 0.50–1.35)
Glucose, Bld: 73 mg/dL (ref 70–99)
Potassium: 4.5 mEq/L (ref 3.5–5.3)
Sodium: 139 mEq/L (ref 135–145)
Total Bilirubin: 0.6 mg/dL (ref 0.3–1.2)
Total Protein: 8.3 g/dL (ref 6.0–8.3)

## 2011-04-14 LAB — CBC
HCT: 43.1 % (ref 39.0–52.0)
Hemoglobin: 14.5 g/dL (ref 13.0–17.0)
MCH: 29.1 pg (ref 26.0–34.0)
MCHC: 33.6 g/dL (ref 30.0–36.0)
MCV: 86.4 fL (ref 78.0–100.0)
Platelets: 276 K/uL (ref 150–400)
RBC: 4.99 MIL/uL (ref 4.22–5.81)
RDW: 14.2 % (ref 11.5–15.5)
WBC: 4.9 K/uL (ref 4.0–10.5)

## 2011-04-15 LAB — T-HELPER CELL (CD4) - (RCID CLINIC ONLY)
CD4 % Helper T Cell: 4 % — ABNORMAL LOW (ref 33–55)
CD4 T Cell Abs: 60 uL — ABNORMAL LOW (ref 400–2700)

## 2011-04-18 LAB — HIV-1 RNA QUANT-NO REFLEX-BLD
HIV 1 RNA Quant: 76 copies/mL — ABNORMAL HIGH (ref <20)
HIV-1 RNA Quant, Log: 1.88 {Log} — ABNORMAL HIGH (ref <1.30)

## 2011-04-28 ENCOUNTER — Ambulatory Visit (INDEPENDENT_AMBULATORY_CARE_PROVIDER_SITE_OTHER): Payer: Medicare Other | Admitting: Internal Medicine

## 2011-04-28 ENCOUNTER — Encounter: Payer: Self-pay | Admitting: Internal Medicine

## 2011-04-28 VITALS — BP 118/79 | HR 63 | Temp 97.6°F | Ht 71.0 in | Wt 153.5 lb

## 2011-04-28 DIAGNOSIS — B20 Human immunodeficiency virus [HIV] disease: Secondary | ICD-10-CM

## 2011-04-28 NOTE — Progress Notes (Signed)
Patient ID: William [name], male   DOB: 1960-11-05, 51 y.o.   MRN: [id number]  INFECTIOUS DISEASE PROGRESS NOTE    Subjective: William [name] is in for his routine followup visit. He does not know the names of his medications but he can now take him off of the chart in describe taking them correctly. He has been working with the adherence counselor at home and is using his pillbox. He states that he has not missed a dose of his medicines since she started using her pillbox.  Objective: Temp: 97.6 F (36.4 C) (03/21 1050) Temp src: Oral (03/21 1050) BP: 118/79 mmHg (03/21 1050) Pulse Rate: 63  (03/21 1050)  General: He is in no distress Skin: No rash Lungs: Clear Cor: Regular S1 and S2 no murmurs   Lab Results HIV 1 RNA Quant (copies/mL)  Date Value  04/14/2011 76*  01/25/2011 48600*  10/07/2010 103000*     CD4 T Cell Abs (cmm)  Date Value  04/14/2011 60*  01/25/2011 20*  10/07/2010 10*     Assessment: His adherence has improved and his viral load is starting to suppress.  Plan: 1. Continue current regimen and followup after lab work in 6 weeks   Cliffton [name], MD [facility] for Infectious Diseases [facility] [phone] pager   [phone] cell 04/28/2011, 2:17 PM

## 2011-05-09 ENCOUNTER — Telehealth: Payer: Self-pay | Admitting: *Deleted

## 2011-05-09 NOTE — Telephone Encounter (Signed)
[facility] RN requesting labs be faxed to office to discuss with pt at next home visit.  Pt previously referred for Medication Compliance issues.  Labs faxed to RN.

## 2011-05-16 ENCOUNTER — Other Ambulatory Visit: Payer: Self-pay | Admitting: Internal Medicine

## 2011-05-16 DIAGNOSIS — Z113 Encounter for screening for infections with a predominantly sexual mode of transmission: Secondary | ICD-10-CM

## 2011-06-16 ENCOUNTER — Other Ambulatory Visit: Payer: Medicare Other

## 2011-06-21 ENCOUNTER — Other Ambulatory Visit: Payer: Medicare Other

## 2011-06-21 DIAGNOSIS — B20 Human immunodeficiency virus [HIV] disease: Secondary | ICD-10-CM

## 2011-06-22 LAB — CBC
HCT: 41.6 % (ref 39.0–52.0)
Hemoglobin: 13.7 g/dL (ref 13.0–17.0)
MCH: 29.7 pg (ref 26.0–34.0)
MCHC: 32.9 g/dL (ref 30.0–36.0)
MCV: 90 fL (ref 78.0–100.0)
Platelets: 239 10*3/uL (ref 150–400)
RBC: 4.62 MIL/uL (ref 4.22–5.81)
RDW: 14.2 % (ref 11.5–15.5)
WBC: 4 10*3/uL (ref 4.0–10.5)

## 2011-06-22 LAB — COMPREHENSIVE METABOLIC PANEL
ALT: 57 U/L — ABNORMAL HIGH (ref 0–53)
AST: 74 U/L — ABNORMAL HIGH (ref 0–37)
Albumin: 4.1 g/dL (ref 3.5–5.2)
Alkaline Phosphatase: 49 U/L (ref 39–117)
BUN: 13 mg/dL (ref 6–23)
CO2: 29 mEq/L (ref 19–32)
Calcium: 9.3 mg/dL (ref 8.4–10.5)
Chloride: 105 mEq/L (ref 96–112)
Creat: 0.9 mg/dL (ref 0.50–1.35)
Glucose, Bld: 72 mg/dL (ref 70–99)
Potassium: 4.9 mEq/L (ref 3.5–5.3)
Sodium: 140 mEq/L (ref 135–145)
Total Bilirubin: 0.6 mg/dL (ref 0.3–1.2)
Total Protein: 7.6 g/dL (ref 6.0–8.3)

## 2011-06-22 LAB — T-HELPER CELL (CD4) - (RCID CLINIC ONLY)
CD4 % Helper T Cell: 6 % — ABNORMAL LOW (ref 33–55)
CD4 T Cell Abs: 110 uL — ABNORMAL LOW (ref 400–2700)

## 2011-06-23 LAB — HIV-1 RNA QUANT-NO REFLEX-BLD
HIV 1 RNA Quant: 185 copies/mL — ABNORMAL HIGH (ref <20)
HIV-1 RNA Quant, Log: 2.27 {Log} — ABNORMAL HIGH (ref <1.30)

## 2011-06-30 ENCOUNTER — Encounter: Payer: Self-pay | Admitting: Internal Medicine

## 2011-06-30 ENCOUNTER — Ambulatory Visit (INDEPENDENT_AMBULATORY_CARE_PROVIDER_SITE_OTHER): Payer: Medicare Other | Admitting: Internal Medicine

## 2011-06-30 VITALS — BP 121/79 | HR 70 | Temp 97.8°F | Ht 71.0 in | Wt 156.5 lb

## 2011-06-30 DIAGNOSIS — B20 Human immunodeficiency virus [HIV] disease: Secondary | ICD-10-CM

## 2011-06-30 NOTE — Progress Notes (Signed)
Patient ID: William [name], male   DOB: 02/28/1960, 51 y.o.   MRN: [id number]     [facility] for Infectious Disease  Patient Active Problem List  Diagnoses  . HIV DISEASE  . HEPATITIS C  . PNEUMOCYSTIS PNEUMONIA  . ALLERGIC RHINITIS  . Pneumothorax  . LOW BACK PAIN  . ALCOHOL ABUSE, HX OF  . SPONTANEOUS PNEUMOTHORAX  . OTHER NONSPECIFIC ABNORMAL SERUM ENZYME LEVELS    Patient's Medications  New Prescriptions   No medications on file  Previous Medications   EMTRICITABINE-TENOFOVIR (TRUVADA) 200-300 MG PER TABLET    Take 1 tablet by mouth daily.   ENSURE (ENSURE)    Take 2 Cans by mouth 2 (two) times daily.   RALTEGRAVIR (ISENTRESS) 400 MG TABLET    Take 1 tablet (400 mg total) by mouth 2 (two) times daily.   SULFAMETHOXAZOLE-TRIMETHOPRIM (BACTRIM DS) 800-160 MG PER TABLET    Take 1 tablet by mouth daily.  Modified Medications   No medications on file  Discontinued Medications   No medications on file    Subjective: William [name] is in for his routine visit. He denies missing any doses of his antiretroviral medication. He feels out his pillbox every Wednesday and he is still working with her medication adherence counselor, Jaquita Folds.  Objective: Temp: 97.8 F (36.6 C) (05/23 1034) Temp src: Oral (05/23 1034) BP: 121/79 mmHg (05/23 1034) Pulse Rate: 70  (05/23 1034)  General: He is in good spirits Skin: He has a healing rash on his forearms it was probably poison ivy. Lungs: Clear Cor: Regular S1 and S2 no murmurs  Lab Results HIV 1 RNA Quant (copies/mL)  Date Value  06/21/2011 185*  04/14/2011 76*  01/25/2011 48600*     CD4 T Cell Abs (cmm)  Date Value  06/21/2011 110*  04/14/2011 60*  01/25/2011 20*     Assessment: His HIV infection is coming under much better control and his adherence has improved dramatically.  Plan: 1. Continue current antiretroviral regimen 2. Return in 3 months   Cliffton [name], MD [facility] for Infectious Disease [facility]  [facility] Group [phone] pager   [phone] cell 06/30/2011, 10:52 AM

## 2011-08-03 ENCOUNTER — Telehealth: Payer: Self-pay | Admitting: *Deleted

## 2011-08-03 NOTE — Telephone Encounter (Signed)
Needing latest lab results and OV notes to complete pt records.  RN faxed records to [facility].

## 2011-08-05 ENCOUNTER — Telehealth: Payer: Self-pay | Admitting: *Deleted

## 2011-08-05 NOTE — Telephone Encounter (Signed)
William [name] with [facility] given verbal order for RN nursing services. She is taking over for [facility] with [facility]. Wendall Mola CMA

## 2011-09-01 ENCOUNTER — Telehealth: Payer: Self-pay | Admitting: *Deleted

## 2011-09-01 NOTE — Telephone Encounter (Signed)
Pt being released from jail about the middle of August according to the pt's guardian, his aunt.  Follow-up appts made for lab work and MD and shared with the guardian.  She will make sure that he gets the information.   Information relayed to Chip Boer, RN, Treatment Adherence RN in the [place] office.  [phone].

## 2011-10-03 ENCOUNTER — Other Ambulatory Visit: Payer: Medicare Other

## 2011-10-03 ENCOUNTER — Ambulatory Visit: Payer: Medicare Other | Admitting: Internal Medicine

## 2011-10-03 DIAGNOSIS — Z79899 Other long term (current) drug therapy: Secondary | ICD-10-CM

## 2011-10-03 DIAGNOSIS — B20 Human immunodeficiency virus [HIV] disease: Secondary | ICD-10-CM

## 2011-10-03 DIAGNOSIS — Z113 Encounter for screening for infections with a predominantly sexual mode of transmission: Secondary | ICD-10-CM

## 2011-10-03 LAB — LIPID PANEL
Cholesterol: 147 mg/dL (ref 0–200)
HDL: 41 mg/dL
LDL Cholesterol: 88 mg/dL (ref 0–99)
Total CHOL/HDL Ratio: 3.6 ratio
Triglycerides: 90 mg/dL
VLDL: 18 mg/dL (ref 0–40)

## 2011-10-04 LAB — HIV-1 RNA QUANT-NO REFLEX-BLD
HIV 1 RNA Quant: <20 copies/mL (ref <20)
HIV-1 RNA Quant, Log: <1.3 {Log} (ref <1.30)

## 2011-10-04 LAB — T-HELPER CELL (CD4) - (RCID CLINIC ONLY)
CD4 % Helper T Cell: 9 % — ABNORMAL LOW (ref 33–55)
CD4 T Cell Abs: 110 uL — ABNORMAL LOW (ref 400–2700)

## 2011-10-04 LAB — RPR

## 2011-10-11 ENCOUNTER — Ambulatory Visit: Payer: Medicare Other | Admitting: Internal Medicine

## 2011-11-09 ENCOUNTER — Ambulatory Visit (INDEPENDENT_AMBULATORY_CARE_PROVIDER_SITE_OTHER): Payer: Medicare Other

## 2011-11-09 DIAGNOSIS — Z23 Encounter for immunization: Secondary | ICD-10-CM

## 2011-11-16 ENCOUNTER — Ambulatory Visit: Payer: Medicare Other | Admitting: Internal Medicine

## 2011-11-16 ENCOUNTER — Telehealth: Payer: Self-pay | Admitting: *Deleted

## 2011-11-16 NOTE — Telephone Encounter (Signed)
Called the patient to reschedule his missed appt and his numbers are not in service.

## 2011-11-24 ENCOUNTER — Ambulatory Visit (INDEPENDENT_AMBULATORY_CARE_PROVIDER_SITE_OTHER): Payer: Medicare Other | Admitting: Internal Medicine

## 2011-11-24 ENCOUNTER — Encounter: Payer: Self-pay | Admitting: Internal Medicine

## 2011-11-24 VITALS — BP 126/86 | HR 74 | Temp 98.5°F | Ht 72.0 in | Wt 160.0 lb

## 2011-11-24 DIAGNOSIS — B20 Human immunodeficiency virus [HIV] disease: Secondary | ICD-10-CM

## 2011-11-24 DIAGNOSIS — K047 Periapical abscess without sinus: Secondary | ICD-10-CM | POA: Insufficient documentation

## 2011-11-24 DIAGNOSIS — Z21 Asymptomatic human immunodeficiency virus [HIV] infection status: Secondary | ICD-10-CM

## 2011-11-24 MED ORDER — AMOXICILLIN-POT CLAVULANATE 875-125 MG PO TABS: 1.0000 | ORAL_TABLET | Freq: Two times a day (BID) | ORAL | Status: DC

## 2011-11-24 MED ORDER — RALTEGRAVIR POTASSIUM 400 MG PO TABS: 400.0000 mg | ORAL_TABLET | Freq: Two times a day (BID) | ORAL | Status: DC

## 2011-11-24 MED ORDER — SULFAMETHOXAZOLE-TMP DS 800-160 MG PO TABS: 1.0000 | ORAL_TABLET | Freq: Every day | ORAL | Status: DC

## 2011-11-24 MED ORDER — EMTRICITABINE-TENOFOVIR DF 200-300 MG PO TABS: 1.0000 | ORAL_TABLET | Freq: Every day | ORAL | Status: DC

## 2011-11-24 NOTE — Progress Notes (Addendum)
Patient ID: William [name], male   DOB: [birth year], 51 y.o.   MRN: [id number]     [facility] for Infectious Disease  Patient Active Problem List  Diagnosis  . HIV DISEASE  . HEPATITIS C  . PNEUMOCYSTIS PNEUMONIA  . ALLERGIC RHINITIS  . Pneumothorax  . LOW BACK PAIN  . ALCOHOL ABUSE, HX OF  . SPONTANEOUS PNEUMOTHORAX  . OTHER NONSPECIFIC ABNORMAL SERUM ENZYME LEVELS  . Dental abscess    Patient's Medications  New Prescriptions   AMOXICILLIN-CLAVULANATE (AUGMENTIN) 875-125 MG PER TABLET    Take 1 tablet by mouth 2 (two) times daily.  Previous Medications   EMTRICITABINE-TENOFOVIR (TRUVADA) 200-300 MG PER TABLET    Take 1 tablet by mouth daily.   ENSURE (ENSURE)    Take 2 Cans by mouth 2 (two) times daily.   RALTEGRAVIR (ISENTRESS) 400 MG TABLET    Take 1 tablet (400 mg total) by mouth 2 (two) times daily.   SULFAMETHOXAZOLE-TRIMETHOPRIM (BACTRIM DS) 800-160 MG PER TABLET    Take 1 tablet by mouth daily.  Modified Medications   No medications on file  Discontinued Medications   No medications on file    Subjective: William [name] is in for his routine visit. He states that he has not missed any doses of his Truvada or Isentress since his last visit. He was incarcerated for a brief time in August do to a violation of his probation. He chooses not to tell me what that violation was but states that he is no longer on probation and has no pending legal charges against him. He is currently living with his wife in [place]. He is doing well with the exception of some recent fever and mouth pain. Started several days ago and he has got some swelling on his hard palate. He also wants assistance getting back the right to manage his own affairs. He states that he has had financial payee for as long as he can remember. He was a ward of the state many years ago.  Objective: Temp: 98.5 F (36.9 C) (10/17 1444) Temp src: Oral (10/17 1444) BP: 126/86 mmHg (10/17 1444) Pulse Rate: 74  (10/17  1444)  General: he is alert in good spirits Oral: He has tender swelling on the left side of his hard palate. He has missing and loose teeth. Skin: no rash Lungs: clear Cor: regular S1 and S2 no murmurs  Lab Results HIV 1 RNA Quant (copies/mL)  Date Value  10/03/2011 <20   06/21/2011 185*  04/14/2011 76*     CD4 T Cell Abs (cmm)  Date Value  10/03/2011 110*  06/21/2011 110*  04/14/2011 60*     Assessment: His HIV is coming under much better control. I will continue Truvada and is interested in repeat lab work in 6 weeks.  He has poor dentition and a probable dental abscess. I will make sure he is on the waiting list for our dental clinic and put him on Augmentin.  I suggested that he be referred for case management to review his financial payee situation  Plan: 1. Continue Truvada and Isentress 2. Repeat lab work in 6 weeks 3. Augmentin 875 mg twice a day for 10 days 4. He needs to be seen in the dental clinic 5. He and his sister would like to go and see a case Production designer, theatre/television/film at [facility]   Cliffton [name], MD [facility] for Infectious Disease [facility] [phone] pager   [phone] cell 11/24/2011, 3:05  PM

## 2011-11-24 NOTE — Addendum Note (Signed)
Addended by: Jennet Maduro D on: 11/24/2011 03:35 PM   Modules accepted: Orders

## 2011-12-27 ENCOUNTER — Other Ambulatory Visit: Payer: Medicare Other

## 2011-12-27 DIAGNOSIS — B20 Human immunodeficiency virus [HIV] disease: Secondary | ICD-10-CM

## 2011-12-28 LAB — T-HELPER CELL (CD4) - (RCID CLINIC ONLY)
CD4 % Helper T Cell: 9 % — ABNORMAL LOW (ref 33–55)
CD4 T Cell Abs: 140 uL — ABNORMAL LOW (ref 400–2700)

## 2011-12-28 LAB — HIV-1 RNA QUANT-NO REFLEX-BLD
HIV 1 RNA Quant: 34302 copies/mL — ABNORMAL HIGH (ref <20)
HIV-1 RNA Quant, Log: 4.54 {Log} — ABNORMAL HIGH (ref <1.30)

## 2012-01-10 ENCOUNTER — Telehealth: Payer: Self-pay | Admitting: *Deleted

## 2012-01-10 ENCOUNTER — Ambulatory Visit: Payer: Medicare Other | Admitting: Internal Medicine

## 2012-01-10 NOTE — Telephone Encounter (Signed)
Lef message requesting pt to call [facility] for another appt w/ Dr. Orvan Falconer.

## 2012-01-24 ENCOUNTER — Ambulatory Visit (INDEPENDENT_AMBULATORY_CARE_PROVIDER_SITE_OTHER): Payer: Medicare Other | Admitting: Internal Medicine

## 2012-01-24 ENCOUNTER — Encounter: Payer: Self-pay | Admitting: Internal Medicine

## 2012-01-24 VITALS — BP 142/82 | HR 78 | Temp 98.1°F | Ht 71.0 in | Wt 160.0 lb

## 2012-01-24 DIAGNOSIS — B20 Human immunodeficiency virus [HIV] disease: Secondary | ICD-10-CM

## 2012-01-24 DIAGNOSIS — Z21 Asymptomatic human immunodeficiency virus [HIV] infection status: Secondary | ICD-10-CM

## 2012-01-24 MED ORDER — RALTEGRAVIR POTASSIUM 400 MG PO TABS: 400.0000 mg | ORAL_TABLET | Freq: Two times a day (BID) | ORAL | Status: DC

## 2012-01-24 MED ORDER — EMTRICITABINE-TENOFOVIR DF 200-300 MG PO TABS: 1.0000 | ORAL_TABLET | Freq: Every day | ORAL | Status: DC

## 2012-01-24 MED ORDER — SULFAMETHOXAZOLE-TMP DS 800-160 MG PO TABS: 1.0000 | ORAL_TABLET | Freq: Every day | ORAL | Status: DC

## 2012-01-24 NOTE — Progress Notes (Signed)
Patient ID: William [name], male   DOB: Jan 25, 1961, 51 y.o.   MRN: [id number]     [facility] for Infectious Disease  Patient Active Problem List  Diagnosis  . HIV DISEASE  . HEPATITIS C  . PNEUMOCYSTIS PNEUMONIA  . ALLERGIC RHINITIS  . Pneumothorax  . LOW BACK PAIN  . ALCOHOL ABUSE, HX OF  . SPONTANEOUS PNEUMOTHORAX  . OTHER NONSPECIFIC ABNORMAL SERUM ENZYME LEVELS  . Dental abscess    Patient's Medications  New Prescriptions   No medications on file  Previous Medications   ENSURE (ENSURE)    Take 2 Cans by mouth 2 (two) times daily.  Modified Medications   Modified Medication Previous Medication   EMTRICITABINE-TENOFOVIR (TRUVADA) 200-300 MG PER TABLET emtricitabine-tenofovir (TRUVADA) 200-300 MG per tablet      Take 1 tablet by mouth daily.    Take 1 tablet by mouth daily.   RALTEGRAVIR (ISENTRESS) 400 MG TABLET raltegravir (ISENTRESS) 400 MG tablet      Take 1 tablet (400 mg total) by mouth 2 (two) times daily.    Take 1 tablet (400 mg total) by mouth 2 (two) times daily.   SULFAMETHOXAZOLE-TRIMETHOPRIM (BACTRIM DS) 800-160 MG PER TABLET sulfamethoxazole-trimethoprim (BACTRIM DS) 800-160 MG per tablet      Take 1 tablet by mouth daily.    Take 1 tablet by mouth daily.  Discontinued Medications   AMOXICILLIN-CLAVULANATE (AUGMENTIN) 875-125 MG PER TABLET    Take 1 tablet by mouth 2 (two) times daily.    Subjective: William [name] is in for his routine visit. He states that his had lots of distractions and stress recently. He was living in [place] with his wife but moved back to [place] and has an apartment on his own now. He has been out of all of his medications for over a month and prior to that time he was not taking them regularly do to the distractions. He does not elaborate on what is distractions were. He states he is eager to get back on his medications and is currently working with a new case Production designer, theatre/television/film, Designer, industrial/product. He is still having some swelling around 1 of his left maxillary  molars. He has not been into the dental clinic yet.  Objective: Temp: 98.1 F (36.7 C) (12/17 1030) Temp src: Oral (12/17 1030) BP: 142/82 mmHg (12/17 1030) Pulse Rate: 78  (12/17 1030)  General: He is in good spirits Oral: Missing teeth. No obvious abscess seen Skin: No rash Lungs: Clear Cor: Regular S1 and S2 no murmurs  Lab Results HIV 1 RNA Quant (copies/mL)  Date Value  12/27/2011 34302*  10/03/2011 <20   06/21/2011 185*     CD4 T Cell Abs (cmm)  Date Value  12/27/2011 140*  10/03/2011 110*  06/21/2011 110*     Assessment: William [name] has been struggling with adherence following his recent incarceration and a transient moved to [place]. He may have developed some resistance when taking his medicines inconsistently. I will have him restart his medications and check genotype and integrase resistance tests.  Plan: 1. Refills of his medications sent to [facility] 2. He's been given a pillbox  3.  lab work today  4.  followup on the dental clinic referral 5. Return in one month   Cliffton [name], MD [facility] for Infectious Disease [facility] [phone] pager   [phone] cell 01/24/2012, 10:47 AM

## 2012-01-30 LAB — HIV-1 GENOTYPR PLUS

## 2012-01-31 LAB — HLA B*5701: HLA-B*5701: NEGATIVE

## 2012-02-21 ENCOUNTER — Telehealth: Payer: Self-pay | Admitting: Infectious Disease

## 2012-02-21 NOTE — Telephone Encounter (Signed)
William [name] had Phenosense integrase performed and this showed FULL activity of ALL integrase IN including raltegravir, elvitegravir and raltegravir

## 2012-02-24 ENCOUNTER — Other Ambulatory Visit: Payer: Medicare Other

## 2012-02-27 ENCOUNTER — Ambulatory Visit: Payer: Medicare Other | Admitting: Internal Medicine

## 2012-02-27 ENCOUNTER — Telehealth: Payer: Self-pay | Admitting: *Deleted

## 2012-02-27 NOTE — Telephone Encounter (Signed)
Called patient and left message to call the clinic to reschedule his appt, he no showed today. William [name]

## 2012-03-06 ENCOUNTER — Ambulatory Visit (INDEPENDENT_AMBULATORY_CARE_PROVIDER_SITE_OTHER): Payer: Medicare Other | Admitting: Internal Medicine

## 2012-03-06 ENCOUNTER — Encounter: Payer: Self-pay | Admitting: Internal Medicine

## 2012-03-06 VITALS — BP 150/99 | HR 65 | Temp 98.0°F | Ht 71.0 in | Wt 162.8 lb

## 2012-03-06 DIAGNOSIS — Z79899 Other long term (current) drug therapy: Secondary | ICD-10-CM

## 2012-03-06 DIAGNOSIS — B20 Human immunodeficiency virus [HIV] disease: Secondary | ICD-10-CM

## 2012-03-06 LAB — CBC
HCT: 44.7 % (ref 39.0–52.0)
Hemoglobin: 14.9 g/dL (ref 13.0–17.0)
MCH: 28.3 pg (ref 26.0–34.0)
MCHC: 33.3 g/dL (ref 30.0–36.0)
MCV: 85 fL (ref 78.0–100.0)
Platelets: 201 10*3/uL (ref 150–400)
RBC: 5.26 MIL/uL (ref 4.22–5.81)
RDW: 14.1 % (ref 11.5–15.5)
WBC: 4.2 10*3/uL (ref 4.0–10.5)

## 2012-03-06 LAB — COMPREHENSIVE METABOLIC PANEL
ALT: 91 U/L — ABNORMAL HIGH (ref 0–53)
AST: 145 U/L — ABNORMAL HIGH (ref 0–37)
Albumin: 4.5 g/dL (ref 3.5–5.2)
Alkaline Phosphatase: 68 U/L (ref 39–117)
BUN: 12 mg/dL (ref 6–23)
CO2: 32 mEq/L (ref 19–32)
Calcium: 9.5 mg/dL (ref 8.4–10.5)
Chloride: 101 mEq/L (ref 96–112)
Creat: 0.8 mg/dL (ref 0.50–1.35)
Glucose, Bld: 73 mg/dL (ref 70–99)
Potassium: 4.1 mEq/L (ref 3.5–5.3)
Sodium: 141 mEq/L (ref 135–145)
Total Bilirubin: 0.8 mg/dL (ref 0.3–1.2)
Total Protein: 8.1 g/dL (ref 6.0–8.3)

## 2012-03-06 LAB — LIPID PANEL
Cholesterol: 158 mg/dL (ref 0–200)
HDL: 59 mg/dL (ref >39)
LDL Cholesterol: 85 mg/dL (ref 0–99)
Total CHOL/HDL Ratio: 2.7 Ratio
Triglycerides: 72 mg/dL (ref <150)
VLDL: 14 mg/dL (ref 0–40)

## 2012-03-06 NOTE — Progress Notes (Signed)
Patient ID: William [name], male   DOB: March 29, 1960, 52 y.o.   MRN: [id number]     [facility] for Infectious Disease  Patient Active Problem List  Diagnosis  . HIV DISEASE  . HEPATITIS C  . PNEUMOCYSTIS PNEUMONIA  . ALLERGIC RHINITIS  . Pneumothorax  . LOW BACK PAIN  . ALCOHOL ABUSE, HX OF  . SPONTANEOUS PNEUMOTHORAX  . OTHER NONSPECIFIC ABNORMAL SERUM ENZYME LEVELS  . Dental abscess    Patient's Medications  New Prescriptions   No medications on file  Previous Medications   EMTRICITABINE-TENOFOVIR (TRUVADA) 200-300 MG PER TABLET    Take 1 tablet by mouth daily.   RALTEGRAVIR (ISENTRESS) 400 MG TABLET    Take 1 tablet (400 mg total) by mouth 2 (two) times daily.   SULFAMETHOXAZOLE-TRIMETHOPRIM (BACTRIM DS) 800-160 MG PER TABLET    Take 1 tablet by mouth daily.  Modified Medications   No medications on file  Discontinued Medications   No medications on file    Subjective: William [name] is in for his regular visit. He does not know the names of his medications initially but then can pick Truvada and Isentress off of the chart and describe taking them correctly. He states that he does use his pillbox. He says that he believes his lab work will be much better today because he has been working much harder on taking his medication and not missing doses. He is going [name] on his way home today to get his new supply.  Objective: Temp: 98 F (36.7 C) (01/28 1405) Temp src: Oral (01/28 1405) BP: 150/99 mmHg (01/28 1405) Pulse Rate: 65  (01/28 1405)  General: He is in good spirits Oral: Multiple missing teeth; no oropharyngeal lesions Skin: No rash Lungs: Clear Cor: Regular S1 and S2 no murmurs  Lab Results HIV 1 RNA Quant (copies/mL)  Date Value  12/27/2011 34302*  10/03/2011 <20   06/21/2011 185*     CD4 T Cell Abs (cmm)  Date Value  12/27/2011 140*  10/03/2011 110*  06/21/2011 110*     Assessment: I will continue his current regimen for now but repeat lab  work today. We'll also work on continued adherence reinforcement.  Plan: 1. Continue current medications 2. Check lab work today 3. Adherence counseling 4. Followup in 4-6 weeks   Cliffton [name], MD [facility] for Infectious Disease [facility] [phone] pager   [phone] cell 03/06/2012, 2:22 PM

## 2012-03-07 LAB — RPR

## 2012-03-07 LAB — HIV-1 RNA QUANT-NO REFLEX-BLD
HIV 1 RNA Quant: 27 copies/mL — ABNORMAL HIGH (ref <20)
HIV-1 RNA Quant, Log: 1.43 {Log} — ABNORMAL HIGH (ref <1.30)

## 2012-03-07 LAB — T-HELPER CELL (CD4) - (RCID CLINIC ONLY)
CD4 % Helper T Cell: 8 % — ABNORMAL LOW (ref 33–55)
CD4 T Cell Abs: 90 uL — ABNORMAL LOW (ref 400–2700)

## 2012-04-18 ENCOUNTER — Encounter: Payer: Self-pay | Admitting: Internal Medicine

## 2012-06-05 ENCOUNTER — Encounter: Payer: Self-pay | Admitting: Internal Medicine

## 2012-06-05 ENCOUNTER — Ambulatory Visit (INDEPENDENT_AMBULATORY_CARE_PROVIDER_SITE_OTHER): Payer: Medicare Other | Admitting: Internal Medicine

## 2012-06-05 ENCOUNTER — Other Ambulatory Visit: Payer: Self-pay | Admitting: *Deleted

## 2012-06-05 ENCOUNTER — Telehealth: Payer: Self-pay | Admitting: *Deleted

## 2012-06-05 VITALS — BP 120/80 | HR 73 | Temp 97.7°F | Ht 71.0 in | Wt 154.2 lb

## 2012-06-05 DIAGNOSIS — B20 Human immunodeficiency virus [HIV] disease: Secondary | ICD-10-CM

## 2012-06-05 MED ORDER — ELVITEG-COBIC-EMTRICIT-TENOFDF 150-150-200-300 MG PO TABS: 1.0000 | ORAL_TABLET | Freq: Every day | ORAL | Status: DC

## 2012-06-05 MED ORDER — ENSURE PO LIQD: 237.0000 mL | Freq: Two times a day (BID) | ORAL | Status: DC

## 2012-06-05 MED ORDER — SULFAMETHOXAZOLE-TMP DS 800-160 MG PO TABS: 1.0000 | ORAL_TABLET | Freq: Every day | ORAL | Status: DC

## 2012-06-05 NOTE — Telephone Encounter (Signed)
Pts pharmacy has changed

## 2012-06-05 NOTE — Progress Notes (Signed)
HPI: William [name] is a 52 y.o. male with HIV who is here for his follow up visit  Allergies: No Known Allergies  Vitals: Temp: 97.7 F (36.5 C) (04/29 1019) Temp src: Oral (04/29 1019) BP: 120/80 mmHg (04/29 1019) Pulse Rate: 73 (04/29 1019)  Past Medical History: No past medical history on file.  Social History: History   Social History  . Marital Status: Married    Spouse Name: N/A    Number of Children: N/A  . Years of Education: N/A   Social History Main Topics  . Smoking status: Former Smoker -- 0.30 packs/day for 15 years    Types: Cigarettes  . Smokeless tobacco: Never Used     Comment: stopped smoking March 2014  . Alcohol Use: No  . Drug Use: No  . Sexually Active: Not Currently     Comment: declined condoms   Other Topics Concern  . None   Social History Narrative  . None    Current Regimen: RAL + Truvada  Labs: HIV 1 RNA Quant (copies/mL)  Date Value  03/06/2012 27*  12/27/2011 34302*  10/03/2011 <20      CD4 T Cell Abs (cmm)  Date Value  03/06/2012 90*  12/27/2011 140*  10/03/2011 110*     Hep B S Ab (no units)  Date Value  03/09/2005 positive      Hepatitis B Surface Ag (no units)  Date Value  10/15/2009 NEG      HCV Ab (no units)  Date Value  09/17/2001 [positive     CrCl: Estimated Creatinine Clearance: 110.6 ml/min (by C-G formula based on Cr of 0.8).  Lipids:    Component Value Date/Time   CHOL 158 03/06/2012 1429   TRIG 72 03/06/2012 1429   HDL 59 03/06/2012 1429   CHOLHDL 2.7 03/06/2012 1429   VLDL 14 03/06/2012 1429   LDLCALC 85 03/06/2012 1429    Assessment: 52 yo with long standing HIV how is here for follow up. His compliance has come and go. When I asked him about how he is taking his meds, he pointed out that he has been taking truvada twice a day and raltegravir once day. He is also taking septra once a day. After reviewing his genotypes, he doesn't harbor any significant resistance that I could see. After  discussion with Dr. Orvan Falconer, we'll change him to Stribild to help with his compliance issue. I told him to come and see me every 4 weeks to get his pillbox refilled. He agreed to that plan.    Recommendations: Change regimen to Stribild 1 qday F/u next week for pillbox refill  Clide Cliff, PharmD Clinical Infectious Disease Pharmacist [facility] for Infectious Disease 06/05/2012, 1:26 PM

## 2012-06-05 NOTE — Progress Notes (Signed)
Patient ID: William [name], male   DOB: [birth year], 52 y.o.   MRN: [id number]          [facility] for Infectious Disease  Patient Active Problem List   Diagnosis Date Noted  . HIV DISEASE 07/10/2006    Priority: High  . HEPATITIS C 07/10/2006    Priority: Medium  . Dental abscess 11/24/2011  . OTHER NONSPECIFIC ABNORMAL SERUM ENZYME LEVELS 02/16/2010  . Pneumothorax 10/16/2008  . SPONTANEOUS PNEUMOTHORAX 10/16/2008  . PNEUMOCYSTIS PNEUMONIA 07/10/2006  . ALLERGIC RHINITIS 07/10/2006  . LOW BACK PAIN 07/10/2006  . ALCOHOL ABUSE, HX OF 07/10/2006    Patient's Medications  New Prescriptions   ELVITEGRAVIR-COBICISTAT-EMTRICITABINE-TENOFOVIR (STRIBILD) 150-150-200-300 MG TABS    Take 1 tablet by mouth daily with breakfast.   ENSURE (ENSURE)    Take 237 mLs by mouth 2 (two) times daily between meals.  Previous Medications   SULFAMETHOXAZOLE-TRIMETHOPRIM (BACTRIM DS) 800-160 MG PER TABLET    Take 1 tablet by mouth daily.  Modified Medications   No medications on file  Discontinued Medications   EMTRICITABINE-TENOFOVIR (TRUVADA) 200-300 MG PER TABLET    Take 1 tablet by mouth daily.   RALTEGRAVIR (ISENTRESS) 400 MG TABLET    Take 1 tablet (400 mg total) by mouth 2 (two) times daily.    Subjective: William [name] is in for his routine visit. He says that he has been working harder to not Miss medications but it appears that he has been taking Truvada twice daily along with his twice-daily Isentress. He is still on his trimethoprim sulfamethoxazole. He has been using his pillbox.  Objective: Temp: 97.7 F (36.5 C) (04/29 1019) Temp src: Oral (04/29 1019) BP: 120/80 mmHg (04/29 1019) Pulse Rate: 73 (04/29 1019)  General: He is in good spirits Skin: No rash Lungs: Clear Cor: Regular S1 and S2 no murmurs  Lab Results HIV 1 RNA Quant (copies/mL)  Date Value  03/06/2012 27*  12/27/2011 34302*  10/03/2011 <20      CD4 T Cell Abs (cmm)  Date Value  03/06/2012 90*  12/27/2011  140*  10/03/2011 110*     Assessment: His HIV is coming under better control with his recent improved adherence but he is still struggling to take his medications correctly. He is been counseled by our pharmacist, Ulyses Southward, today and we have discussed simplifying his regimen once daily Stribild. I believe that we'll be much easier for him. He will followup here next week to have his pillbox filled out correctly. He will need to stay on PCP prophylaxis for now.  Plan: 1. Change Truvada and Isentress to once daily Stribild 2. Continue trimethoprim sulfamethoxazole 3. Ensure supplements 4. Adherence counseling provided. He will followup with Ulyses Southward next week to have his pillbox filled up 5. Consider hepatitis C referral at the time of his next visit 6. Followup after blood work in 3 months   Cliffton [name], MD [facility] for Infectious Disease [facility] [phone] pager   [phone] cell 06/05/2012, 10:43 AM

## 2012-06-05 NOTE — Progress Notes (Signed)
Another staff member corrected the RX issue. Andree Coss, RN

## 2012-06-12 ENCOUNTER — Ambulatory Visit: Payer: Medicare Other

## 2012-08-21 ENCOUNTER — Other Ambulatory Visit: Payer: Medicare Other

## 2012-08-21 DIAGNOSIS — B20 Human immunodeficiency virus [HIV] disease: Secondary | ICD-10-CM

## 2012-08-21 LAB — CBC
HCT: 42.1 % (ref 39.0–52.0)
Hemoglobin: 14 g/dL (ref 13.0–17.0)
MCH: 28.2 pg (ref 26.0–34.0)
MCHC: 33.3 g/dL (ref 30.0–36.0)
MCV: 84.7 fL (ref 78.0–100.0)
Platelets: 239 10*3/uL (ref 150–400)
RBC: 4.97 MIL/uL (ref 4.22–5.81)
RDW: 15.8 % — ABNORMAL HIGH (ref 11.5–15.5)
WBC: 3.8 10*3/uL — ABNORMAL LOW (ref 4.0–10.5)

## 2012-08-22 LAB — COMPLETE METABOLIC PANEL WITH GFR
ALT: 66 U/L — ABNORMAL HIGH (ref 0–53)
AST: 44 U/L — ABNORMAL HIGH (ref 0–37)
Albumin: 4.5 g/dL (ref 3.5–5.2)
Alkaline Phosphatase: 59 U/L (ref 39–117)
BUN: 15 mg/dL (ref 6–23)
CO2: 29 mEq/L (ref 19–32)
Calcium: 9.5 mg/dL (ref 8.4–10.5)
Chloride: 104 mEq/L (ref 96–112)
Creat: 1.07 mg/dL (ref 0.50–1.35)
GFR, Est African American: >89 mL/min
GFR, Est Non African American: 81 mL/min
Glucose, Bld: 80 mg/dL (ref 70–99)
Potassium: 4.8 mEq/L (ref 3.5–5.3)
Sodium: 139 mEq/L (ref 135–145)
Total Bilirubin: 0.4 mg/dL (ref 0.3–1.2)
Total Protein: 8 g/dL (ref 6.0–8.3)

## 2012-08-22 LAB — HIV-1 RNA QUANT-NO REFLEX-BLD
HIV 1 RNA Quant: 38 copies/mL — ABNORMAL HIGH (ref <20)
HIV-1 RNA Quant, Log: 1.58 {Log} — ABNORMAL HIGH (ref <1.30)

## 2012-08-22 LAB — T-HELPER CELL (CD4) - (RCID CLINIC ONLY)
CD4 % Helper T Cell: 7 % — ABNORMAL LOW (ref 33–55)
CD4 T Cell Abs: 120 uL — ABNORMAL LOW (ref 400–2700)

## 2012-09-04 ENCOUNTER — Encounter: Payer: Self-pay | Admitting: Internal Medicine

## 2012-09-04 ENCOUNTER — Ambulatory Visit (INDEPENDENT_AMBULATORY_CARE_PROVIDER_SITE_OTHER): Payer: Medicare Other | Admitting: Internal Medicine

## 2012-09-04 VITALS — BP 113/73 | HR 65 | Temp 98.1°F | Ht 71.0 in | Wt 159.2 lb

## 2012-09-04 DIAGNOSIS — B20 Human immunodeficiency virus [HIV] disease: Secondary | ICD-10-CM

## 2012-09-04 DIAGNOSIS — Z79899 Other long term (current) drug therapy: Secondary | ICD-10-CM

## 2012-09-04 MED ORDER — MEGESTROL ACETATE 400 MG/10ML PO SUSP: 800.0000 mg | Freq: Every day | ORAL | Status: DC

## 2012-09-04 NOTE — Progress Notes (Signed)
Patient ID: William [name], male   DOB: [birth year], 52 y.o.   MRN: [id number]          [facility] for Infectious Disease  Patient Active Problem List   Diagnosis Date Noted  . HIV DISEASE 07/10/2006    Priority: High  . HEPATITIS C 07/10/2006    Priority: Medium  . Dental abscess 11/24/2011  . OTHER NONSPECIFIC ABNORMAL SERUM ENZYME LEVELS 02/16/2010  . Pneumothorax 10/16/2008  . SPONTANEOUS PNEUMOTHORAX 10/16/2008  . PNEUMOCYSTIS PNEUMONIA 07/10/2006  . ALLERGIC RHINITIS 07/10/2006  . LOW BACK PAIN 07/10/2006  . ALCOHOL ABUSE, HX OF 07/10/2006    Patient's Medications  New Prescriptions   MEGESTROL (MEGACE) 400 MG/10ML SUSPENSION    Take 20 mLs (800 mg total) by mouth daily.  Previous Medications   ELVITEGRAVIR-COBICISTAT-EMTRICITABINE-TENOFOVIR (STRIBILD) 150-150-200-300 MG TABS    Take 1 tablet by mouth daily with breakfast.   ENSURE (ENSURE)    Take 237 mLs by mouth 2 (two) times daily between meals.   SULFAMETHOXAZOLE-TRIMETHOPRIM (BACTRIM DS) 800-160 MG PER TABLET    Take 1 tablet by mouth daily.  Modified Medications   No medications on file  Discontinued Medications   No medications on file    Subjective: William [name] is in for his routine visit with his wife William [name]. They're now back together and living in [place]. She is overseeing his medication and feels that his pillbox each week. He has not missed any doses of his medications since his last visit. He takes them when he first gets up at 5 AM. He is still waiting to be seen in our dental clinic. Overall he is feeling much better. His only concern is that he is having a little bit of soreness and cracking at the corners of his mouth. He has been using some of William [name]'s Megace and his appetite is much better. Review of Systems: Pertinent items are noted in HPI.  No past medical history on file.  History  Substance Use Topics  . Smoking status: Current Every Day Smoker -- 0.40 packs/day for 15 years    Types:  Cigarettes  . Smokeless tobacco: Never Used  . Alcohol Use: No    No family history on file.  No Known Allergies  Objective: Temp: 98.1 F (36.7 C) (07/29 1129) BP: 113/73 mmHg (07/29 1129) Pulse Rate: 65 (07/29 1129)  General: He has gained weight and he is in good spirits Oral: Mild perleche. Some missing teeth in remaining teeth are in poor condition Skin: No rash Lungs: Clear Cor: Regular S1 and S2 no murmurs Abdomen: Soft and nontender Joints and extremities: No acute abnormalities Neuro: Alert with normal speech and conversation Mood and affect: Normal  Lab Results HIV 1 RNA Quant (copies/mL)  Date Value  08/21/2012 38*  03/06/2012 27*  12/27/2011 34302*     CD4 T Cell Abs (cmm)  Date Value  08/21/2012 120*  03/06/2012 90*  12/27/2011 140*     Lab Results  Component Value Date   WBC 3.8* 08/21/2012   HGB 14.0 08/21/2012   HCT 42.1 08/21/2012   MCV 84.7 08/21/2012   PLT 239 08/21/2012   BMET    Component Value Date/Time   NA 139 08/21/2012 0924   K 4.8 08/21/2012 0924   CL 104 08/21/2012 0924   CO2 29 08/21/2012 0924   GLUCOSE 80 08/21/2012 0924   BUN 15 08/21/2012 0924   CREATININE 1.07 08/21/2012 0924   CREATININE 0.86 04/20/2010 2103   CALCIUM 9.5 08/21/2012 0924  GFRNONAA >60 10/10/2008 0318   GFRAA  Value: >60        The eGFR has been calculated using the MDRD equation. This calculation has not been validated in all clinical situations. eGFR's persistently <60 mL/min signify possible Chronic Kidney Disease. 10/10/2008 0318   Lab Results  Component Value Date   ALT 66* 08/21/2012   AST 44* 08/21/2012   ALKPHOS 59 08/21/2012   BILITOT 0.4 08/21/2012   Lab Results  Component Value Date   CHOL 158 03/06/2012   HDL 59 03/06/2012   LDLCALC 85 03/06/2012   TRIG 72 03/06/2012   CHOLHDL 2.7 03/06/2012   Assessment: His HIV infection has come under much better control and he is beginning to have some CD4 reconstitution. His adherence seems to be very good with his  wife's supervision.  Plan: 1. Continue current medications 2. Dental clinic referral 3. Add Megace 4. Followup after lab work in 6 months   Cliffton [name], MD [facility] for Infectious Disease [facility] [phone] pager   [phone] cell 09/04/2012, 11:47 AM

## 2012-09-12 ENCOUNTER — Encounter: Payer: Self-pay | Admitting: *Deleted

## 2012-10-17 ENCOUNTER — Ambulatory Visit: Payer: Medicare Other

## 2012-10-17 DIAGNOSIS — Z23 Encounter for immunization: Secondary | ICD-10-CM

## 2013-01-22 ENCOUNTER — Encounter: Payer: Self-pay | Admitting: *Deleted

## 2013-01-22 ENCOUNTER — Encounter: Payer: Self-pay | Admitting: Internal Medicine

## 2013-01-22 ENCOUNTER — Other Ambulatory Visit: Payer: Self-pay | Admitting: Internal Medicine

## 2013-01-22 ENCOUNTER — Ambulatory Visit (INDEPENDENT_AMBULATORY_CARE_PROVIDER_SITE_OTHER): Payer: Medicare Other | Admitting: Internal Medicine

## 2013-01-22 VITALS — BP 113/76 | HR 82 | Temp 98.2°F | Ht 61.0 in | Wt 159.0 lb

## 2013-01-22 DIAGNOSIS — J309 Allergic rhinitis, unspecified: Secondary | ICD-10-CM

## 2013-01-22 DIAGNOSIS — B20 Human immunodeficiency virus [HIV] disease: Secondary | ICD-10-CM

## 2013-01-22 DIAGNOSIS — J302 Other seasonal allergic rhinitis: Secondary | ICD-10-CM | POA: Insufficient documentation

## 2013-01-22 MED ORDER — LORATADINE 10 MG PO TABS: 10.0000 mg | ORAL_TABLET | Freq: Every day | ORAL | Status: DC

## 2013-01-22 NOTE — Progress Notes (Signed)
Patient ID: William [name], male   DOB: 1960/03/24, 52 y.o.   MRN: [id number]          [name] for Infectious Disease  Patient Active Problem List   Diagnosis Date Noted  . HIV DISEASE 07/10/2006    Priority: High  . HEPATITIS C 07/10/2006    Priority: Medium  . Seasonal allergies 01/22/2013  . Dental abscess 11/24/2011  . OTHER NONSPECIFIC ABNORMAL SERUM ENZYME LEVELS 02/16/2010  . Pneumothorax 10/16/2008  . SPONTANEOUS PNEUMOTHORAX 10/16/2008  . PNEUMOCYSTIS PNEUMONIA 07/10/2006  . LOW BACK PAIN 07/10/2006  . ALCOHOL ABUSE, HX OF 07/10/2006    Patient's Medications  New Prescriptions   LORATADINE (CLARITIN) 10 MG TABLET    Take 1 tablet (10 mg total) by mouth daily.  Previous Medications   ELVITEGRAVIR-COBICISTAT-EMTRICITABINE-TENOFOVIR (STRIBILD) 150-150-200-300 MG TABS    Take 1 tablet by mouth daily with breakfast.   ENSURE (ENSURE)    Take 237 mLs by mouth 2 (two) times daily between meals.   MEGESTROL (MEGACE) 400 MG/10ML SUSPENSION    Take 20 mLs (800 mg total) by mouth daily.   SULFAMETHOXAZOLE-TRIMETHOPRIM (BACTRIM DS) 800-160 MG PER TABLET    Take 1 tablet by mouth daily.  Modified Medications   No medications on file  Discontinued Medications   No medications on file    Subjective: William [name] is in for his routine visit with his wife, William [name]. He is currently using a pill box and does not believe he is missed any doses of his Stribild, Bactrim or Megace. He is feeling better except that he has been bothered by his seasonal allergies. He has runny nose, sneezing and puffiness of his left eye lid when he first gets up in the morning. He is currently not taking anything for her seasonal allergies. He continues to smoke cigarettes but states that he thinks he probably could quit. Not has quit smoking and says she is going to try to help him quit.  Review of Systems: Constitutional: negative Eyes: positive for puffy left eyelid each morning, negative for  irritation, redness and visual disturbance Ears, nose, mouth, throat, and face: positive for nasal congestion, negative for sore mouth and sore throat Respiratory: negative Cardiovascular: negative Gastrointestinal: negative Genitourinary:negative  No past medical history on file.  History  Substance Use Topics  . Smoking status: Current Every Day Smoker -- 0.40 packs/day for 15 years    Types: Cigarettes  . Smokeless tobacco: Never Used  . Alcohol Use: No    No family history on file.  No Known Allergies  Objective: Temp: 98.2 F (36.8 C) (12/16 0918) Temp src: Oral (12/16 0918) BP: 113/76 mmHg (12/16 0918) Pulse Rate: 82 (12/16 0918)  General: He is in no distress Oral: No oropharyngeal lesions Skin: No rash Eyes: Some darkening under his left eye but no unusual swelling Lungs: Clear Cor: Regular S1 and S2 no murmurs Abdomen: Nontender Joints and extremities: Normal Neuro: Alert and oriented with normal speech and conversation Mood and affect: Appropriate  Lab Results Lab Results  Component Value Date   WBC 3.8* 08/21/2012   HGB 14.0 08/21/2012   HCT 42.1 08/21/2012   MCV 84.7 08/21/2012   PLT 239 08/21/2012    Lab Results  Component Value Date   CREATININE 1.07 08/21/2012   BUN 15 08/21/2012   NA 139 08/21/2012   K 4.8 08/21/2012   CL 104 08/21/2012   CO2 29 08/21/2012    Lab Results  Component Value Date   ALT  66* 08/21/2012   AST 44* 08/21/2012   ALKPHOS 59 08/21/2012   BILITOT 0.4 08/21/2012    Lab Results  Component Value Date   CHOL 158 03/06/2012   HDL 59 03/06/2012   LDLCALC 85 03/06/2012   TRIG 72 03/06/2012   CHOLHDL 2.7 03/06/2012    Lab Results HIV 1 RNA Quant (copies/mL)  Date Value  08/21/2012 38*  03/06/2012 27*  12/27/2011 34302*     CD4 T Cell Abs (cmm)  Date Value  08/21/2012 120*  03/06/2012 90*  12/27/2011 140*     Lab Results  Component Value Date   WBC 3.8* 08/21/2012   HGB 14.0 08/21/2012   HCT 42.1 08/21/2012   MCV 84.7  08/21/2012   PLT 239 08/21/2012   BMET    Component Value Date/Time   NA 139 08/21/2012 0924   K 4.8 08/21/2012 0924   CL 104 08/21/2012 0924   CO2 29 08/21/2012 0924   GLUCOSE 80 08/21/2012 0924   BUN 15 08/21/2012 0924   CREATININE 1.07 08/21/2012 0924   CREATININE 0.86 04/20/2010 2103   CALCIUM 9.5 08/21/2012 0924   GFRNONAA >60 10/10/2008 0318   GFRAA  Value: >60        The eGFR has been calculated using the MDRD equation. This calculation has not been validated in all clinical situations. eGFR's persistently <60 mL/min signify possible Chronic Kidney Disease. 10/10/2008 0318   Lab Results  Component Value Date   ALT 66* 08/21/2012   AST 44* 08/21/2012   ALKPHOS 59 08/21/2012   BILITOT 0.4 08/21/2012   Lab Results  Component Value Date   CHOL 158 03/06/2012   HDL 59 03/06/2012   LDLCALC 85 03/06/2012   TRIG 72 03/06/2012   CHOLHDL 2.7 03/06/2012   Assessment: His adherence has improved significantly since he moved back in with daughter. I will continue Stribild and repeat CD4 and viral load today.  I spent greater than 3 minutes talking to him about the importance of cigarette cessation counseling. He does have the support William [name] to try to quit and seems to be somewhat motivated. I asked him to consider setting a quit date.  He has seasonal allergies and I will start him on loratadine.  He has hepatitis C and may be a candidate for new oral therapy soon.  Plan: 1. Continue current medications 2. Start loratadine 3. Cigarette cessation counseling provided   Cliffton [name], MD [facility] for Infectious Disease [facility] [phone] pager   [phone] cell 01/22/2013, 9:31 AM

## 2013-01-23 LAB — HIV-1 RNA QUANT-NO REFLEX-BLD
HIV 1 RNA Quant: <20 copies/mL (ref <20)
HIV-1 RNA Quant, Log: <1.3 {Log} (ref <1.30)

## 2013-01-23 LAB — T-HELPER CELL (CD4) - (RCID CLINIC ONLY)
CD4 % Helper T Cell: 10 % — ABNORMAL LOW (ref 33–55)
CD4 T Cell Abs: 120 /uL — ABNORMAL LOW (ref 400–2700)

## 2013-01-23 MED ORDER — SULFAMETHOXAZOLE-TMP DS 800-160 MG PO TABS: 1.0000 | ORAL_TABLET | Freq: Every day | ORAL | Status: DC

## 2013-01-23 MED ORDER — MEGESTROL ACETATE 400 MG/10ML PO SUSP: 800.0000 mg | Freq: Every day | ORAL | Status: DC

## 2013-03-05 ENCOUNTER — Other Ambulatory Visit: Payer: Medicare Other

## 2013-03-05 DIAGNOSIS — Z79899 Other long term (current) drug therapy: Secondary | ICD-10-CM

## 2013-03-05 DIAGNOSIS — B20 Human immunodeficiency virus [HIV] disease: Secondary | ICD-10-CM

## 2013-03-05 LAB — LIPID PANEL
Cholesterol: 165 mg/dL (ref 0–200)
HDL: 33 mg/dL — ABNORMAL LOW (ref >39)
LDL Cholesterol: 121 mg/dL — ABNORMAL HIGH (ref 0–99)
Total CHOL/HDL Ratio: 5 Ratio
Triglycerides: 54 mg/dL (ref <150)
VLDL: 11 mg/dL (ref 0–40)

## 2013-03-05 LAB — COMPREHENSIVE METABOLIC PANEL
ALT: 81 U/L — ABNORMAL HIGH (ref 0–53)
AST: 48 U/L — ABNORMAL HIGH (ref 0–37)
Albumin: 4.5 g/dL (ref 3.5–5.2)
Alkaline Phosphatase: 63 U/L (ref 39–117)
BUN: 12 mg/dL (ref 6–23)
CO2: 26 mEq/L (ref 19–32)
Calcium: 9.4 mg/dL (ref 8.4–10.5)
Chloride: 107 mEq/L (ref 96–112)
Creat: 0.89 mg/dL (ref 0.50–1.35)
Glucose, Bld: 86 mg/dL (ref 70–99)
Potassium: 4.8 mEq/L (ref 3.5–5.3)
Sodium: 138 mEq/L (ref 135–145)
Total Bilirubin: 0.6 mg/dL (ref 0.3–1.2)
Total Protein: 7.9 g/dL (ref 6.0–8.3)

## 2013-03-05 LAB — CBC
HCT: 39.3 % (ref 39.0–52.0)
Hemoglobin: 13.3 g/dL (ref 13.0–17.0)
MCH: 29.6 pg (ref 26.0–34.0)
MCHC: 33.8 g/dL (ref 30.0–36.0)
MCV: 87.3 fL (ref 78.0–100.0)
Platelets: 227 10*3/uL (ref 150–400)
RBC: 4.5 MIL/uL (ref 4.22–5.81)
RDW: 13.9 % (ref 11.5–15.5)
WBC: 3.9 10*3/uL — ABNORMAL LOW (ref 4.0–10.5)

## 2013-03-06 LAB — HIV-1 RNA QUANT-NO REFLEX-BLD
HIV 1 RNA Quant: 21 copies/mL (ref <20)
HIV-1 RNA Quant, Log: 1.32 {Log} — ABNORMAL HIGH (ref <1.30)

## 2013-03-06 LAB — T-HELPER CELL (CD4) - (RCID CLINIC ONLY)
CD4 % Helper T Cell: 11 % — ABNORMAL LOW (ref 33–55)
CD4 T Cell Abs: 170 /uL — ABNORMAL LOW (ref 400–2700)

## 2013-03-06 LAB — RPR

## 2013-03-19 ENCOUNTER — Ambulatory Visit: Payer: Medicare Other | Admitting: Internal Medicine

## 2013-04-11 ENCOUNTER — Ambulatory Visit: Payer: Medicare Other | Admitting: Internal Medicine

## 2013-04-17 ENCOUNTER — Encounter (HOSPITAL_COMMUNITY): Payer: Self-pay | Admitting: Emergency Medicine

## 2013-04-17 ENCOUNTER — Observation Stay (HOSPITAL_COMMUNITY)
Admission: EM | Admit: 2013-04-17 | Discharge: 2013-04-19 | Disposition: A | Discharge status: Home / Self Care | Payer: Medicare Other | Attending: General Surgery | Admitting: General Surgery

## 2013-04-17 ENCOUNTER — Other Ambulatory Visit: Payer: Self-pay

## 2013-04-17 ENCOUNTER — Emergency Department (HOSPITAL_COMMUNITY): Payer: Medicare Other

## 2013-04-17 DIAGNOSIS — J9383 Other pneumothorax: Principal | ICD-10-CM | POA: Insufficient documentation

## 2013-04-17 DIAGNOSIS — R079 Chest pain, unspecified: Secondary | ICD-10-CM | POA: Insufficient documentation

## 2013-04-17 DIAGNOSIS — F17200 Nicotine dependence, unspecified, uncomplicated: Secondary | ICD-10-CM | POA: Insufficient documentation

## 2013-04-17 DIAGNOSIS — J939 Pneumothorax, unspecified: Secondary | ICD-10-CM | POA: Diagnosis present

## 2013-04-17 DIAGNOSIS — Z21 Asymptomatic human immunodeficiency virus [HIV] infection status: Secondary | ICD-10-CM | POA: Insufficient documentation

## 2013-04-17 HISTORY — DX: Asymptomatic human immunodeficiency virus (hiv) infection status: Z21

## 2013-04-17 HISTORY — DX: Human immunodeficiency virus (HIV) disease: B20

## 2013-04-17 HISTORY — DX: Pneumothorax, unspecified: J93.9

## 2013-04-17 LAB — CBC
HCT: 38.4 % — ABNORMAL LOW (ref 39.0–52.0)
Hemoglobin: 13.1 g/dL (ref 13.0–17.0)
MCH: 29.2 pg (ref 26.0–34.0)
MCHC: 34.1 g/dL (ref 30.0–36.0)
MCV: 85.5 fL (ref 78.0–100.0)
Platelets: 230 10*3/uL (ref 150–400)
RBC: 4.49 MIL/uL (ref 4.22–5.81)
RDW: 13.4 % (ref 11.5–15.5)
WBC: 5.3 10*3/uL (ref 4.0–10.5)

## 2013-04-17 LAB — BASIC METABOLIC PANEL
BUN: 15 mg/dL (ref 6–23)
CO2: 25 mEq/L (ref 19–32)
Calcium: 9.1 mg/dL (ref 8.4–10.5)
Chloride: 107 mEq/L (ref 96–112)
Creatinine, Ser: 1.07 mg/dL (ref 0.50–1.35)
GFR calc Af Amer: >90 mL/min (ref >90)
GFR calc non Af Amer: 79 mL/min — ABNORMAL LOW (ref >90)
Glucose, Bld: 89 mg/dL (ref 70–99)
Potassium: 4.1 mEq/L (ref 3.7–5.3)
Sodium: 143 mEq/L (ref 137–147)

## 2013-04-17 LAB — MRSA PCR SCREENING: MRSA by PCR: NEGATIVE

## 2013-04-17 LAB — PRO B NATRIURETIC PEPTIDE: Pro B Natriuretic peptide (BNP): <5 pg/mL (ref 0–125)

## 2013-04-17 LAB — TROPONIN I: Troponin I: <0.3 ng/mL (ref <0.30)

## 2013-04-17 MED ORDER — NICOTINE 21 MG/24HR TD PT24
21.0000 mg | MEDICATED_PATCH | Freq: Every day | TRANSDERMAL | Status: DC
  Administered 2013-04-17 – 2013-04-19 (×3): 21 mg via TRANSDERMAL
  Filled 2013-04-17 (×3): qty 1

## 2013-04-17 MED ORDER — SULFAMETHOXAZOLE-TMP DS 800-160 MG PO TABS
1.0000 | ORAL_TABLET | Freq: Every day | ORAL | Status: DC
  Administered 2013-04-18 – 2013-04-19 (×2): 1 via ORAL
  Filled 2013-04-17 (×2): qty 1

## 2013-04-17 MED ORDER — ENOXAPARIN SODIUM 40 MG/0.4ML ~~LOC~~ SOLN
40.0000 mg | SUBCUTANEOUS | Status: DC
  Administered 2013-04-17 – 2013-04-18 (×2): 40 mg via SUBCUTANEOUS
  Filled 2013-04-17 (×2): qty 0.4

## 2013-04-17 MED ORDER — ONDANSETRON HCL 4 MG/2ML IJ SOLN: 4.0000 mg | Freq: Four times a day (QID) | INTRAMUSCULAR | Status: DC | PRN

## 2013-04-17 MED ORDER — HYDROMORPHONE HCL PF 1 MG/ML IJ SOLN: 1.0000 mg | INTRAMUSCULAR | Status: DC | PRN

## 2013-04-17 MED ORDER — ELVITEG-COBIC-EMTRICIT-TENOFDF 150-150-200-300 MG PO TABS
1.0000 | ORAL_TABLET | Freq: Every day | ORAL | Status: DC
  Filled 2013-04-17 (×2): qty 1

## 2013-04-17 MED ORDER — DIPHENHYDRAMINE HCL 12.5 MG/5ML PO ELIX: 12.5000 mg | ORAL_SOLUTION | Freq: Four times a day (QID) | ORAL | Status: DC | PRN

## 2013-04-17 MED ORDER — DIPHENHYDRAMINE HCL 50 MG/ML IJ SOLN: 12.5000 mg | Freq: Four times a day (QID) | INTRAMUSCULAR | Status: DC | PRN

## 2013-04-17 MED ORDER — ACETAMINOPHEN 325 MG PO TABS: 650.0000 mg | ORAL_TABLET | Freq: Four times a day (QID) | ORAL | Status: DC | PRN

## 2013-04-17 MED ORDER — LORATADINE 10 MG PO TABS
10.0000 mg | ORAL_TABLET | Freq: Every day | ORAL | Status: DC
  Administered 2013-04-17 – 2013-04-19 (×3): 10 mg via ORAL
  Filled 2013-04-17 (×3): qty 1

## 2013-04-17 MED ORDER — ACETAMINOPHEN 650 MG RE SUPP
650.0000 mg | Freq: Four times a day (QID) | RECTAL | Status: DC | PRN
Start: 2013-04-17 — End: 2013-04-19

## 2013-04-17 MED ORDER — HYDROCODONE-ACETAMINOPHEN 5-325 MG PO TABS
1.0000 | ORAL_TABLET | ORAL | Status: DC | PRN
  Administered 2013-04-17 – 2013-04-18 (×2): 1 via ORAL
  Administered 2013-04-19: 2 via ORAL
  Filled 2013-04-17: qty 1
  Filled 2013-04-17: qty 2
  Filled 2013-04-17: qty 1

## 2013-04-17 NOTE — ED Provider Notes (Signed)
CSN: [id number]     Arrival date & time 04/17/13  1257 History  This chart was scribed for William [name], * by Ludger Nutting, ED Scribe. This patient was seen in room APA18/APA18 and the patient's care was started 1:51 PM.    Chief Complaint  Patient presents with  . Chest Pain      The history is provided by the patient. No language interpreter was used.    HPI Comments: [name] is a 53 y.o. male who presents to the Emergency Department complaining of 1 day of constant, unchanged left sided chest pain with radiation to the left lateral chest and up to the neck. He reports associated SOB, worse with exertion. He describes the pain as sharp. He has a history of a pneumothorax on the right side. He denies fever, chills.   Past Medical History  Diagnosis Date  . HIV (human immunodeficiency virus infection)   . Pneumothorax    Past Surgical History  Procedure Laterality Date  . Chest tube insertion     No family history on file. History  Substance Use Topics  . Smoking status: Current Every Day Smoker -- 0.40 packs/day for 15 years    Types: Cigarettes  . Smokeless tobacco: Never Used  . Alcohol Use: No    Review of Systems  Constitutional: Negative for fever and chills.  Respiratory: Positive for shortness of breath.   Cardiovascular: Positive for chest pain.      Allergies  Review of patient's allergies indicates no known allergies.  Home Medications   Current Outpatient Rx  Name  Route  Sig  Dispense  Refill  . ENSURE (ENSURE)   Oral   Take 237 mLs by mouth 2 (two) times daily between meals.   237 mL   11   . loratadine (CLARITIN) 10 MG tablet   Oral   Take 1 tablet (10 mg total) by mouth daily.   30 tablet   6   . megestrol (MEGACE) 400 MG/10ML suspension   Oral   Take 20 mLs (800 mg total) by mouth daily.   240 mL   5   . STRIBILD 150-150-200-300 MG TABS tablet      TAKE 1 TABLET BY MOUTH EVERY MORNING WITH BREAKFAST   30 tablet    5   . sulfamethoxazole-trimethoprim (BACTRIM DS) 800-160 MG per tablet   Oral   Take 1 tablet by mouth daily.   30 tablet   4    BP 127/90  Pulse 73  Temp(Src) 97.2 F (36.2 C) (Oral)  Resp 17  Ht 5\' 11"  (1.803 m)  Wt 158 lb (71.668 kg)  BMI 22.05 kg/m2  SpO2 96% Physical Exam  Nursing note and vitals reviewed. Constitutional: He is oriented to person, place, and time. He appears well-developed and well-nourished. No distress.  HENT:  Head: Normocephalic and atraumatic.  Right Ear: Hearing normal.  Left Ear: Hearing normal.  Nose: Nose normal.  Mouth/Throat: Oropharynx is clear and moist and mucous membranes are normal.  Eyes: Conjunctivae and EOM are normal. Pupils are equal, round, and reactive to light.  Neck: Normal range of motion. Neck supple.  Cardiovascular: Normal rate, regular rhythm, S1 normal and S2 normal.  Exam reveals no gallop and no friction rub.   No murmur heard. Pulmonary/Chest: Breath sounds normal. Tachypnea noted. No respiratory distress. He exhibits no tenderness.  Abdominal: Soft. Normal appearance and bowel sounds are normal. There is no hepatosplenomegaly. There is no tenderness. There is  no rebound, no guarding, no tenderness at McBurney's point and negative Murphy's sign. No hernia.  Musculoskeletal: Normal range of motion.  Neurological: He is alert and oriented to person, place, and time. He has normal strength. No cranial nerve deficit or sensory deficit. Coordination normal. GCS eye subscore is 4. GCS verbal subscore is 5. GCS motor subscore is 6.  Skin: Skin is warm, dry and intact. No rash noted. No cyanosis.  Psychiatric: He has a normal mood and affect. His speech is normal and behavior is normal. Thought content normal.    ED Course  Procedures (including critical care time)  DIAGNOSTIC STUDIES: Oxygen Saturation is 97% on RA, adequate by my interpretation.    COORDINATION OF CARE: 1:54 PM Discussed treatment plan with pt at bedside  and pt agreed to plan.   Labs Review Labs Reviewed  CBC - Abnormal; Notable for the following:    HCT 38.4 (*)    All other components within normal limits  BASIC METABOLIC PANEL - Abnormal; Notable for the following:    GFR calc non Af Amer 79 (*)    All other components within normal limits  PRO B NATRIURETIC PEPTIDE  TROPONIN I   Imaging Review Dg Chest 2 View  04/17/2013   CLINICAL DATA Left-sided chest pain radiating to left arm running yesterday  EXAM CHEST  2 VIEW  COMPARISON DG CHEST 2 VIEW dated 02/10/2009  FINDINGS There is a 50 mm left pneumothorax. There is mild interstitial change at the left base most consistent with atelectasis. Left lung is otherwise clear. Right lung is clear except for mid lung and apical scarring, both stable. Heart size and vascular pattern are normal.  IMPRESSION Small left pneumothorax. Critical Value/emergent results were called by telephone at the time of interpretation on 04/17/2013 at 1:35 PM to Dr. Waverly Ferrari , who verbally acknowledged these results.  SIGNATURE  Electronically Signed   By: Skipper Cliche M.D.   On: 04/17/2013 13:35     EKG Interpretation None      Date: 04/17/2013  Rate: 84  Rhythm: normal sinus rhythm  QRS Axis: normal  Intervals: normal  ST/T Wave abnormalities: normal  Conduction Disutrbances: none  Narrative Interpretation: unremarkable      MDM   Final diagnoses:  Pneumothorax on left    Patient presents to ER with complaints of pain in the left side of his chest which radiates around to his back. He has accompanying shortness of breath. X-ray shows left-sided pneumothorax. This finding explains the patient's symptoms. The remainder of his workup is unremarkable, including cardiac workup. Patient is symptomatic, gets short of breath with walking short distances. Pneumothorax is not simply apical, does have some tracking down the left lateral aspect of the lung, approximately 15 mm of pneumothorax in the lateral  portion. Case was discussed with Doctor Arnoldo Morale, he will see the patient in the ER when he is finished surgery. Patient will be prepped for chest tube.  I personally performed the services described in this documentation, which was scribed in my presence. The recorded information has been reviewed and is accurate.    William Greek, MD 04/17/13 [phone]

## 2013-04-17 NOTE — H&P (Signed)
William [name] is an 53 y.o. male.   Chief Complaint: Left-sided chest pain HPI: Patient is a 53 year old black male with history of HIV who presented emergency room with a four-day history of left-sided chest pain and occasional shortness of breath. During his workup for chest pain, he was found on chest x-ray to have a left pneumothorax which is approximately 10-15% on the left side. He previously had a right pneumothorax the head to be treated with thoracostomy in the past at [facility]. He states that his HIV is well controlled, last seen in infectious disease in December of last year. He continues to smoke half-pack cigarettes a day. In the emergency room, he states his chest pain is significantly decreased and he denies any shortness of breath.  Past Medical History  Diagnosis Date  . HIV (human immunodeficiency virus infection)   . Pneumothorax     Past Surgical History  Procedure Laterality Date  . Chest tube insertion      No family history on file. Social History:  reports that he has been smoking Cigarettes.  He has a 6 pack-year smoking history. He has never used smokeless tobacco. He reports that he does not drink alcohol or use illicit drugs.  Allergies: No Known Allergies   (Not in a hospital admission)  Results for orders placed during the hospital encounter of 04/17/13 (from the past 48 hour(s))  CBC     Status: Abnormal   Collection Time    04/17/13  1:58 PM      Result Value Ref Range   WBC 5.3  4.0 - 10.5 K/uL   RBC 4.49  4.22 - 5.81 MIL/uL   Hemoglobin 13.1  13.0 - 17.0 g/dL   HCT 38.4 (*) 39.0 - 52.0 %   MCV 85.5  78.0 - 100.0 fL   MCH 29.2  26.0 - 34.0 pg   MCHC 34.1  30.0 - 36.0 g/dL   RDW 13.4  11.5 - 15.5 %   Platelets 230  150 - 400 K/uL  BASIC METABOLIC PANEL     Status: Abnormal   Collection Time    04/17/13  1:58 PM      Result Value Ref Range   Sodium 143  137 - 147 mEq/L   Potassium 4.1  3.7 - 5.3 mEq/L   Chloride 107  96 - 112 mEq/L    CO2 25  19 - 32 mEq/L   Glucose, Bld 89  70 - 99 mg/dL   BUN 15  6 - 23 mg/dL   Creatinine, Ser 1.07  0.50 - 1.35 mg/dL   Calcium 9.1  8.4 - 10.5 mg/dL   GFR calc non Af Amer 79 (*) >90 mL/min   GFR calc Af Amer >90  >90 mL/min   Comment: (NOTE)     The eGFR has been calculated using the CKD EPI equation.     This calculation has not been validated in all clinical situations.     eGFR's persistently <90 mL/min signify possible Chronic Kidney     Disease.  PRO B NATRIURETIC PEPTIDE     Status: None   Collection Time    04/17/13  1:58 PM      Result Value Ref Range   Pro B Natriuretic peptide (BNP) <5.0  0 - 125 pg/mL  TROPONIN I     Status: None   Collection Time    04/17/13  1:58 PM      Result Value Ref Range   Troponin  I <0.30  <0.30 ng/mL   Comment:            Due to the release kinetics of cTnI,     a negative result within the first hours     of the onset of symptoms does not rule out     myocardial infarction with certainty.     If myocardial infarction is still suspected,     repeat the test at appropriate intervals.   Dg Chest 2 View  04/17/2013   CLINICAL DATA Left-sided chest pain radiating to left arm running yesterday  EXAM CHEST  2 VIEW  COMPARISON DG CHEST 2 VIEW dated 02/10/2009  FINDINGS There is a 50 mm left pneumothorax. There is mild interstitial change at the left base most consistent with atelectasis. Left lung is otherwise clear. Right lung is clear except for mid lung and apical scarring, both stable. Heart size and vascular pattern are normal.  IMPRESSION Small left pneumothorax. Critical Value/emergent results were called by telephone at the time of interpretation on 04/17/2013 at 1:35 PM to Dr. Waverly Ferrari , who verbally acknowledged these results.  SIGNATURE  Electronically Signed   By: Skipper Cliche M.D.   On: 04/17/2013 13:35    Review of Systems  Constitutional: Negative.   Cardiovascular: Positive for chest pain.  Gastrointestinal: Negative.    Genitourinary: Negative.   Musculoskeletal: Negative.   Skin: Negative.     Blood pressure 116/89, pulse 61, temperature 97.2 F (36.2 C), temperature source Oral, resp. rate 16, height $RemoveBe'5\' 11"'AuLdskyGM$  (1.803 m), weight 71.668 kg (158 lb), SpO2 99.00%. Physical Exam  Constitutional: He appears well-developed and well-nourished. No distress.  HENT:  Head: Normocephalic and atraumatic.  Neck: Normal range of motion. Neck supple.  Cardiovascular: Normal rate, regular rhythm and normal heart sounds.   Respiratory: Effort normal and breath sounds normal.  Equal breath sounds bilaterally. No crepitus noted.  GI: Soft. Bowel sounds are normal.     Assessment/Plan Impression: Spontaneous left pneumothorax, history of HIV, history of right spontaneous pneumothorax requiring thoracostomy. Currently stable. Plan: Patient would like to avoid a chest tube if at all possible. Given that he is not symptomatic and his pneumothorax is only 10-15%, I told him we could bring them in to the hospital repeat chest x-ray in the a.m. He does realize he may still need a chest tube. We'll monitor his oxygen saturations. We'll continue his Bactrim that he has been on from home. Will monitor expectantly.  Batool Majid A 04/17/2013, 4:47 PM

## 2013-04-17 NOTE — ED Notes (Signed)
Report given to [place], Therapist, sports. Ready to receive patient.

## 2013-04-17 NOTE — ED Notes (Signed)
Left sided cp radiating to left arm and neck starting yesterday with sob/dizziness/back pain.

## 2013-04-17 NOTE — ED Notes (Signed)
I assume care of patient at this time.

## 2013-04-18 ENCOUNTER — Observation Stay (HOSPITAL_COMMUNITY): Payer: Medicare Other

## 2013-04-18 NOTE — Progress Notes (Signed)
  Subjective: Minimal left-sided chest pain. No shortness of breath.  Objective: Vital signs in last 24 hours: Temp:  [97.2 F (36.2 C)-98.3 F (36.8 C)] 97.8 F (36.6 C) (03/12 0422) Pulse Rate:  [56-85] 56 (03/12 0422) Resp:  [16-20] 20 (03/12 0026) BP: (110-130)/(70-90) 110/81 mmHg (03/12 0422) SpO2:  [96 %-100 %] 99 % (03/12 0422) Weight:  [71.668 kg (158 lb)] 71.668 kg (158 lb) (03/11 1740) Last BM Date: 04/16/13  Intake/Output from previous day: 03/11 0701 - 03/12 0700 In: 240 [P.O.:240] Out: 1800 [Urine:1800] Intake/Output this shift:    General appearance: alert, cooperative and no distress Resp: clear to auscultation bilaterally Cardio: regular rate and rhythm, S1, S2 normal, no murmur, click, rub or gallop  Lab Results:   Recent Labs  04/17/13 1358  WBC 5.3  HGB 13.1  HCT 38.4*  PLT 230   BMET  Recent Labs  04/17/13 1358  NA 143  K 4.1  CL 107  CO2 25  GLUCOSE 89  BUN 15  CREATININE 1.07  CALCIUM 9.1   PT/INR No results found for this basename: LABPROT, INR,  in the last 72 hours  Studies/Results: Dg Chest 2 View  04/17/2013   CLINICAL DATA Left-sided chest pain radiating to left arm running yesterday  EXAM CHEST  2 VIEW  COMPARISON DG CHEST 2 VIEW dated 02/10/2009  FINDINGS There is a 50 mm left pneumothorax. There is mild interstitial change at the left base most consistent with atelectasis. Left lung is otherwise clear. Right lung is clear except for mid lung and apical scarring, both stable. Heart size and vascular pattern are normal.  IMPRESSION Small left pneumothorax. Critical Value/emergent results were called by telephone at the time of interpretation on 04/17/2013 at 1:35 PM to Dr. Waverly Ferrari , who verbally acknowledged these results.  SIGNATURE  Electronically Signed   By: Skipper Cliche M.D.   On: 04/17/2013 13:35   Dg Chest Port 1 View  04/18/2013   CLINICAL DATA Left pneumothorax, follow-up  EXAM PORTABLE CHEST - 1 VIEW  COMPARISON Chest  x-ray of 04/17/2013  FINDINGS There has been definite improvement in the volume of left pneumothorax with a small left apical component remaining. Mild volume loss at the left lung base remains. The right lung is clear. Heart size is stable.  Correction to the initial sentence from the dictation of 04/17/2013. The first sentence mentioned a 50 mm left pneumothorax which in reality was measured at 15 mm.  IMPRESSION Decrease in volume of small left apical pneumothorax.  SIGNATURE  Electronically Signed   By: Ivar Drape M.D.   On: 04/18/2013 08:13    Anti-infectives: Anti-infectives   Start     Dose/Rate Route Frequency Ordered Stop   04/18/13 1000  sulfamethoxazole-trimethoprim (BACTRIM DS) 800-160 MG per tablet 1 tablet     1 tablet Oral Daily 04/17/13 1804     04/18/13 0800  elvitegravir-cobicistat-emtricitabine-tenofovir (STRIBILD) 150-150-200-300 MG tablet 1 tablet     1 tablet Oral Daily with breakfast 04/17/13 1804        Assessment/Plan: Impression: Spontaneous left pneumothorax, resolving without need for chest tube. Plan: Continue bed rest and O2 therapy. Will repeat chest x-ray in a.m. Should he continue to improve, will discharge. Patient understands and agrees to the treatment plan.  LOS: 1 day    Holmes Hays A 04/18/2013

## 2013-04-18 NOTE — Progress Notes (Signed)
UR completed 

## 2013-04-19 ENCOUNTER — Observation Stay (HOSPITAL_COMMUNITY): Payer: Medicare Other

## 2013-04-19 ENCOUNTER — Encounter (HOSPITAL_COMMUNITY): Payer: Self-pay | Admitting: *Deleted

## 2013-04-19 NOTE — Progress Notes (Signed)
Patient with orders to be discharge home. Discharge instructions given, patient and spouse verbalized understanding. Patient stable. Patient left in private vehicle with spouse.  

## 2013-04-19 NOTE — Discharge Summary (Signed)
Physician Discharge Summary  Patient ID: ENGLISH CRAIGHEAD MRN: [id number] DOB/AGE: 1960-02-13 53 y.o.  Admit date: 04/17/2013 Discharge date: 04/19/2013  Admission Diagnoses: Spontaneous left pneumothorax, history of right pneumothorax, HIV positivity  Discharge Diagnoses: Same Active Problems:   Pneumothorax, left   Discharged Condition: good  Hospital Course: Patient is a 53 year old white male status post spontaneous right pneumothorax requiring surgical intervention the past who presented with a several day history of worsening left-sided chest pain. He was found to have a proximally 15% spontaneous left pneumothorax. His oxygenation was good and he had minimal pain when seen the emergency room. It was elected to treat him conservatively. He was admitted to the hospital for further evaluation. He was started on O2 therapy. Followup x-rays revealed resolution of the left pneumothorax. The patient is being discharged home on 04/19/2013 in good improving condition.  Discharge Exam: Blood pressure 123/84, pulse 61, temperature 98 F (36.7 C), temperature source Oral, resp. rate 20, height 5\' 11"  (1.803 m), weight 71.668 kg (158 lb), SpO2 100.00%. General appearance: alert, cooperative and no distress Resp: clear to auscultation bilaterally Cardio: regular rate and rhythm, S1, S2 normal, no murmur, click, rub or gallop  Disposition: Home   Future Appointments Provider Department Dept Phone   04/29/2013 3:15 PM Michel Bickers, MD [facility] [phone]       Medication List         loratadine 10 MG tablet  Commonly known as:  CLARITIN  Take 1 tablet (10 mg total) by mouth daily.     megestrol 400 MG/10ML suspension  Commonly known as:  MEGACE  Take 20 mLs (800 mg total) by mouth daily.     STRIBILD 150-150-200-300 MG Tabs tablet  Generic drug:  elvitegravir-cobicistat-emtricitabine-tenofovir  Take 1 tablet by mouth daily with breakfast.      sulfamethoxazole-trimethoprim 800-160 MG per tablet  Commonly known as:  BACTRIM DS  Take 1 tablet by mouth daily.           Follow-up Information   Follow up with Jamesetta So, MD. (As needed, If symptoms worsen)    Specialty:  General Surgery   Contact information:   [address] [place] [address] [phone]       Signed: Aviva Signs A 04/19/2013, 8:52 AM

## 2013-04-19 NOTE — Discharge Instructions (Signed)
Pneumothorax °A pneumothorax, commonly called a collapsed lung, is a condition in which air leaks from a lung and builds up in the space between the lung and the chest wall (pleural space). The air in a pneumothorax is trapped outside the lung and takes up space, preventing the lung from fully expanding. This is a condition that usually occurs suddenly. The buildup of air may be small or large. A small pneumothorax may go away on its own. When a pneumothorax is larger, it will often require medical treatment and hospitalization.  °CAUSES  °A pneumothorax can sometimes happen quickly with no apparent cause. People with underlying lung problems, particularly COPD or emphysema, are at higher risk of pneumothorax. However, pneumothorax can happen quickly even in people with no prior known lung problems. Trauma, surgery, medical procedures, or injury to the chest wall can also cause a pneumothorax. °SIGNS AND SYMPTOMS  °Sometimes a pneumothorax will have no symptoms. When symptoms are present, they can include: °· Chest pain. °· Shortness of breath. °· Increased rate of breathing. °· Bluish color to your lips or skin (cyanosis). °DIAGNOSIS  °Pneumothorax is usually diagnosed by a chest X-ray or chest CT scan. Your health care provider will also take a medical history and perform a physical exam to determine why you may have a pneumothorax. °TREATMENT  °A small pneumothorax may go away on its own without treatment. Extra oxygen can sometimes help a small pneumothorax go away more quickly. For a larger pneumothorax or a pneumothorax that is causing symptoms, a procedure is usually needed to drain the air. In some cases, the health care provider may drain the air using a needle. In other cases, a chest tube may be inserted into the pleural space. A chest tube is a small tube placed between the ribs and into the pleural space. This removes the extra air and allows the lung to expand back to its normal size. A large  pneumothorax will usually require a hospital stay. If there is ongoing air leakage into the pleural space, then the chest tube may need to remain in place for several days until the air leak has healed. In some cases, surgery may be needed.  °HOME CARE INSTRUCTIONS  °· Only take over-the-counter or prescription medicines as directed by your health care provider. °· If a cough or pain makes it difficult for you to sleep at night, try sleeping in a semi-upright position in a recliner or by using 2 or 3 pillows. °· Rest and limit activity as directed by your health care provider. °· If you had a chest tube and it was removed, ask your health care provider when it is okay to remove the dressing. Until your health care provider says you can remove the dressing, do not allow it to get wet. °· Do not smoke. Smoking is a risk factor for pneumothorax. °· Do not fly in an airplane or scuba dive until your health care provider says it is okay. °· Follow up with your health care provider as directed. °SEEK IMMEDIATE MEDICAL CARE IF:  °· You have increasing chest pain or shortness of breath. °· You have a cough that is not controlled with suppressants. °· You begin coughing up blood. °· You have pain that is getting worse or is not controlled with medicines. °· You cough up thick, discolored mucus (sputum) that is yellow to green in color. °· You have redness, increasing pain, or discharge at the site where a chest tube had been in place (if   your pneumothorax was treated with a chest tube).  The site where your chest tube was located opens up.  You feel air coming out of the site where the chest tube was placed.  You have a fever or persistent symptoms for more than 2 3 days.  You have a fever and your symptoms suddenly get worse. MAKE SURE YOU:   Understand these instructions.  Will watch your condition.  Will get help right away if you are not doing well or get worse. Document Released: 01/24/2005 Document  Revised: 11/14/2012 Document Reviewed: 08/23/2012 [facility] Patient Information 2014 [place], [place].

## 2013-04-24 ENCOUNTER — Inpatient Hospital Stay (HOSPITAL_COMMUNITY): Payer: Medicare Other

## 2013-04-24 ENCOUNTER — Encounter (HOSPITAL_COMMUNITY): Payer: Self-pay | Admitting: Emergency Medicine

## 2013-04-24 ENCOUNTER — Inpatient Hospital Stay (HOSPITAL_COMMUNITY)
Admission: EM | Admit: 2013-04-24 | Discharge: 2013-04-30 | DRG: 165 | Disposition: A | Discharge status: Home / Self Care | Payer: Medicare Other | Attending: Surgery | Admitting: Surgery

## 2013-04-24 ENCOUNTER — Emergency Department (HOSPITAL_COMMUNITY): Payer: Medicare Other

## 2013-04-24 DIAGNOSIS — Z21 Asymptomatic human immunodeficiency virus [HIV] infection status: Secondary | ICD-10-CM | POA: Diagnosis present

## 2013-04-24 DIAGNOSIS — J939 Pneumothorax, unspecified: Secondary | ICD-10-CM | POA: Diagnosis present

## 2013-04-24 DIAGNOSIS — J4489 Other specified chronic obstructive pulmonary disease: Secondary | ICD-10-CM | POA: Diagnosis present

## 2013-04-24 DIAGNOSIS — J9382 Other air leak: Secondary | ICD-10-CM | POA: Diagnosis present

## 2013-04-24 DIAGNOSIS — J9383 Other pneumothorax: Secondary | ICD-10-CM

## 2013-04-24 DIAGNOSIS — F17200 Nicotine dependence, unspecified, uncomplicated: Secondary | ICD-10-CM | POA: Diagnosis present

## 2013-04-24 DIAGNOSIS — J449 Chronic obstructive pulmonary disease, unspecified: Secondary | ICD-10-CM | POA: Diagnosis present

## 2013-04-24 DIAGNOSIS — K5900 Constipation, unspecified: Secondary | ICD-10-CM | POA: Diagnosis not present

## 2013-04-24 DIAGNOSIS — J439 Emphysema, unspecified: Secondary | ICD-10-CM | POA: Diagnosis present

## 2013-04-24 DIAGNOSIS — B1920 Unspecified viral hepatitis C without hepatic coma: Secondary | ICD-10-CM | POA: Diagnosis present

## 2013-04-24 HISTORY — DX: Other pneumothorax: J93.83

## 2013-04-24 LAB — PROTIME-INR
INR: 1.02 (ref 0.00–1.49)
Prothrombin Time: 13.2 seconds (ref 11.6–15.2)

## 2013-04-24 LAB — COMPREHENSIVE METABOLIC PANEL
ALT: 75 U/L — ABNORMAL HIGH (ref 0–53)
AST: 43 U/L — ABNORMAL HIGH (ref 0–37)
Albumin: 3.9 g/dL (ref 3.5–5.2)
Alkaline Phosphatase: 71 U/L (ref 39–117)
BUN: 13 mg/dL (ref 6–23)
CO2: 23 mEq/L (ref 19–32)
Calcium: 9.1 mg/dL (ref 8.4–10.5)
Chloride: 105 mEq/L (ref 96–112)
Creatinine, Ser: 1.02 mg/dL (ref 0.50–1.35)
GFR calc Af Amer: >90 mL/min (ref >90)
GFR calc non Af Amer: 84 mL/min — ABNORMAL LOW (ref >90)
Glucose, Bld: 109 mg/dL — ABNORMAL HIGH (ref 70–99)
Potassium: 4.3 mEq/L (ref 3.7–5.3)
Sodium: 140 mEq/L (ref 137–147)
Total Bilirubin: <0.2 mg/dL — ABNORMAL LOW (ref 0.3–1.2)
Total Protein: 7.9 g/dL (ref 6.0–8.3)

## 2013-04-24 LAB — CBC WITH DIFFERENTIAL/PLATELET
Basophils Absolute: 0 10*3/uL (ref 0.0–0.1)
Basophils Relative: 0 % (ref 0–1)
Eosinophils Absolute: 0.2 10*3/uL (ref 0.0–0.7)
Eosinophils Relative: 3 % (ref 0–5)
HCT: 36.2 % — ABNORMAL LOW (ref 39.0–52.0)
Hemoglobin: 12.6 g/dL — ABNORMAL LOW (ref 13.0–17.0)
Lymphocytes Relative: 35 % (ref 12–46)
Lymphs Abs: 2.6 10*3/uL (ref 0.7–4.0)
MCH: 29.6 pg (ref 26.0–34.0)
MCHC: 34.8 g/dL (ref 30.0–36.0)
MCV: 85 fL (ref 78.0–100.0)
Monocytes Absolute: 0.6 10*3/uL (ref 0.1–1.0)
Monocytes Relative: 8 % (ref 3–12)
Neutro Abs: 3.9 10*3/uL (ref 1.7–7.7)
Neutrophils Relative %: 54 % (ref 43–77)
Platelets: 226 10*3/uL (ref 150–400)
RBC: 4.26 MIL/uL (ref 4.22–5.81)
RDW: 13.5 % (ref 11.5–15.5)
WBC: 7.4 10*3/uL (ref 4.0–10.5)

## 2013-04-24 LAB — APTT: aPTT: 25 seconds (ref 24–37)

## 2013-04-24 MED ORDER — NICOTINE 14 MG/24HR TD PT24
14.0000 mg | MEDICATED_PATCH | TRANSDERMAL | Status: DC
  Administered 2013-04-25 (×2): 14 mg via TRANSDERMAL
  Filled 2013-04-24 (×3): qty 1

## 2013-04-24 MED ORDER — ALBUTEROL SULFATE (2.5 MG/3ML) 0.083% IN NEBU: 2.5000 mg | INHALATION_SOLUTION | Freq: Four times a day (QID) | RESPIRATORY_TRACT | Status: DC | PRN

## 2013-04-24 MED ORDER — ONDANSETRON HCL 4 MG/2ML IJ SOLN: 4.0000 mg | Freq: Four times a day (QID) | INTRAMUSCULAR | Status: DC | PRN

## 2013-04-24 MED ORDER — DIPHENHYDRAMINE HCL 50 MG/ML IJ SOLN: 12.5000 mg | Freq: Four times a day (QID) | INTRAMUSCULAR | Status: DC | PRN

## 2013-04-24 MED ORDER — ONDANSETRON HCL 4 MG PO TABS: 4.0000 mg | ORAL_TABLET | Freq: Four times a day (QID) | ORAL | Status: DC | PRN

## 2013-04-24 MED ORDER — MIDAZOLAM HCL 2 MG/2ML IJ SOLN
2.0000 mg | Freq: Once | INTRAMUSCULAR | Status: AC
  Administered 2013-04-24: 2 mg via INTRAVENOUS

## 2013-04-24 MED ORDER — NALOXONE HCL 0.4 MG/ML IJ SOLN: 0.4000 mg | INTRAMUSCULAR | Status: DC | PRN

## 2013-04-24 MED ORDER — MEGESTROL ACETATE 40 MG/ML PO SUSP
400.0000 mg | Freq: Every day | ORAL | Status: DC
  Filled 2013-04-24: qty 10

## 2013-04-24 MED ORDER — ENOXAPARIN SODIUM 40 MG/0.4ML ~~LOC~~ SOLN
40.0000 mg | SUBCUTANEOUS | Status: DC
  Administered 2013-04-25: 40 mg via SUBCUTANEOUS
  Filled 2013-04-24 (×2): qty 0.4

## 2013-04-24 MED ORDER — SODIUM CHLORIDE 0.9 % IV SOLN: INTRAVENOUS | Status: DC

## 2013-04-24 MED ORDER — SODIUM CHLORIDE 0.9 % IJ SOLN: 9.0000 mL | INTRAMUSCULAR | Status: DC | PRN

## 2013-04-24 MED ORDER — SULFAMETHOXAZOLE-TMP DS 800-160 MG PO TABS
1.0000 | ORAL_TABLET | Freq: Every day | ORAL | Status: DC
  Administered 2013-04-25 – 2013-04-30 (×5): 1 via ORAL
  Filled 2013-04-24 (×6): qty 1

## 2013-04-24 MED ORDER — DIPHENHYDRAMINE HCL 12.5 MG/5ML PO ELIX: 12.5000 mg | ORAL_SOLUTION | Freq: Four times a day (QID) | ORAL | Status: DC | PRN

## 2013-04-24 MED ORDER — FENTANYL CITRATE 0.05 MG/ML IJ SOLN
50.0000 ug | Freq: Once | INTRAMUSCULAR | Status: AC
  Administered 2013-04-24: 25 ug via INTRAVENOUS
  Filled 2013-04-24: qty 2

## 2013-04-24 MED ORDER — SODIUM CHLORIDE 0.9 % IV SOLN
INTRAVENOUS | Status: DC
  Administered 2013-04-24 (×2): via INTRAVENOUS

## 2013-04-24 MED ORDER — DIPHENHYDRAMINE HCL 12.5 MG/5ML PO ELIX
12.5000 mg | ORAL_SOLUTION | Freq: Four times a day (QID) | ORAL | Status: DC | PRN
  Filled 2013-04-24: qty 5

## 2013-04-24 MED ORDER — SODIUM CHLORIDE 0.9 % IJ SOLN
3.0000 mL | Freq: Two times a day (BID) | INTRAMUSCULAR | Status: DC
  Administered 2013-04-25: 3 mL via INTRAVENOUS

## 2013-04-24 MED ORDER — SORBITOL 70 % SOLN
30.0000 mL | Freq: Every day | Status: DC | PRN
  Filled 2013-04-24: qty 30

## 2013-04-24 MED ORDER — DOCUSATE SODIUM 100 MG PO CAPS
100.0000 mg | ORAL_CAPSULE | Freq: Two times a day (BID) | ORAL | Status: DC
  Administered 2013-04-25 (×2): 100 mg via ORAL
  Filled 2013-04-24 (×4): qty 1

## 2013-04-24 MED ORDER — ACETAMINOPHEN 650 MG RE SUPP: 650.0000 mg | Freq: Four times a day (QID) | RECTAL | Status: DC | PRN

## 2013-04-24 MED ORDER — ELVITEG-COBIC-EMTRICIT-TENOFDF 150-150-200-300 MG PO TABS
1.0000 | ORAL_TABLET | Freq: Every day | ORAL | Status: DC
  Administered 2013-04-25 – 2013-04-30 (×5): 1 via ORAL
  Filled 2013-04-24 (×7): qty 1

## 2013-04-24 MED ORDER — FENTANYL CITRATE 0.05 MG/ML IJ SOLN: 50.0000 ug | Freq: Once | INTRAMUSCULAR | Status: DC

## 2013-04-24 MED ORDER — MIDAZOLAM HCL 2 MG/2ML IJ SOLN
4.0000 mg | Freq: Once | INTRAMUSCULAR | Status: AC
  Administered 2013-04-24: 2 mg via INTRAVENOUS
  Filled 2013-04-24: qty 4

## 2013-04-24 MED ORDER — FENTANYL 10 MCG/ML IV SOLN
INTRAVENOUS | Status: DC
  Administered 2013-04-25: 5 ug via INTRAVENOUS
  Administered 2013-04-25 (×2): via INTRAVENOUS
  Administered 2013-04-26: 5 ug via INTRAVENOUS
  Administered 2013-04-26: 150 ug via INTRAVENOUS
  Administered 2013-04-26: 5 ug via INTRAVENOUS
  Filled 2013-04-24 (×2): qty 50

## 2013-04-24 MED ORDER — ACETAMINOPHEN 325 MG PO TABS: 650.0000 mg | ORAL_TABLET | Freq: Four times a day (QID) | ORAL | Status: DC | PRN

## 2013-04-24 MED ORDER — LIDOCAINE HCL (PF) 1 % IJ SOLN: 30.0000 mL | Freq: Once | INTRAMUSCULAR | Status: DC

## 2013-04-24 MED ORDER — OXYCODONE HCL 5 MG PO TABS
5.0000 mg | ORAL_TABLET | ORAL | Status: DC | PRN
  Administered 2013-04-24: 5 mg via ORAL
  Filled 2013-04-24: qty 1

## 2013-04-24 MED ORDER — POLYETHYLENE GLYCOL 3350 17 G PO PACK
17.0000 g | PACK | Freq: Every day | ORAL | Status: DC | PRN
  Administered 2013-04-25: 17 g via ORAL
  Filled 2013-04-24: qty 1

## 2013-04-24 MED ORDER — FENTANYL CITRATE 0.05 MG/ML IJ SOLN
100.0000 ug | Freq: Once | INTRAMUSCULAR | Status: AC
  Administered 2013-04-24: 25 ug via INTRAVENOUS

## 2013-04-24 NOTE — ED Provider Notes (Signed)
CSN: [id number]     Arrival date & time 04/24/13  1743 History  This chart was scribed for William Christen, MD by Maree Erie, ED Scribe. The patient was seen in room APA07/APA07. Patient's care was started at 6:45 PM.    Chief Complaint  Patient presents with  . Chest Pain     The history is provided by the patient. No language interpreter was used.   level V caveat for urgent need for intervention.  HPI Comments: William [name] is a 53 y.o. male with a history of pneumothorax and HIV who presents to the Emergency Department complaining of constant, worsening, moderate left sided chest pain that began about five hours ago. The patient states he was admitted for similar symptoms a week ago that was diagnosed as a stable pneumothorax and he was discharged five days ago. He is status post stapling of blebs and partial pleurectomy and chest tube placement for hydropneumothorax in September of 2010 at [facility]  He states his primary physician is Dr. Megan Salon.  Past Medical History  Diagnosis Date  . HIV (human immunodeficiency virus infection)   . Pneumothorax    Past Surgical History  Procedure Laterality Date  . Chest tube insertion     History reviewed. No pertinent family history. History  Substance Use Topics  . Smoking status: Current Every Day Smoker -- 0.40 packs/day for 15 years    Types: Cigarettes  . Smokeless tobacco: Never Used  . Alcohol Use: No    Review of Systems A complete 10 system review of systems was obtained and all systems are negative except as noted in the HPI and PMH.     Allergies  Review of patient's allergies indicates no known allergies.  Home Medications   Current Outpatient Rx  Name  Route  Sig  Dispense  Refill  . albuterol (PROVENTIL HFA;VENTOLIN HFA) 108 (90 BASE) MCG/ACT inhaler   Inhalation   Inhale 2 puffs into the lungs every 6 (six) hours as needed for wheezing or shortness of breath.         .  elvitegravir-cobicistat-emtricitabine-tenofovir (STRIBILD) 150-150-200-300 MG TABS tablet   Oral   Take 1 tablet by mouth daily with breakfast.         . loratadine (CLARITIN) 10 MG tablet   Oral   Take 1 tablet (10 mg total) by mouth daily.   30 tablet   6   . megestrol (MEGACE) 400 MG/10ML suspension   Oral   Take 20 mLs (800 mg total) by mouth daily.   240 mL   5   . sulfamethoxazole-trimethoprim (BACTRIM DS) 800-160 MG per tablet   Oral   Take 1 tablet by mouth daily.   30 tablet   4    Triage Vitals: BP 127/84  Pulse 94  Temp(Src) 98.1 F (36.7 C) (Oral)  Resp 22  Ht 5\' 11"  (1.803 m)  Wt 158 lb (71.668 kg)  BMI 22.05 kg/m2  SpO2 95%  Physical Exam  Nursing note and vitals reviewed. Constitutional: He is oriented to person, place, and time. He appears well-developed and well-nourished.  HENT:  Head: Normocephalic and atraumatic.  Eyes: Conjunctivae and EOM are normal. Pupils are equal, round, and reactive to light.  Neck: Normal range of motion. Neck supple.  Cardiovascular: Normal rate, regular rhythm and normal heart sounds.   Pulmonary/Chest: Effort normal.  Decreased breath sounds on the left side.  Abdominal: Soft. Bowel sounds are normal.  Musculoskeletal: Normal range of motion.  Neurological: He is alert and oriented to person, place, and time.  Skin: Skin is warm and dry.  Psychiatric: He has a normal mood and affect. His behavior is normal.    ED Course  Procedures (including critical care time)  DIAGNOSTIC STUDIES: Oxygen Saturation is 95% on room air, adequate by my interpretation.    COORDINATION OF CARE: 7:04 PM - Discussed radiology results with patient and wife as well as plan to admit patient for his pneumothorax. Patient verbalizes understanding and agrees with treatment plan.    Dg Chest 2 View  04/24/2013   CLINICAL DATA:  Sharp chest pain.  EXAM: CHEST  2 VIEW  COMPARISON:  DG CHEST 1V PORT dated 04/19/2013  FINDINGS: There is a  large left pneumothorax with near complete collapse of the left lung. No evidence of tension. Heart is normal size. Right lung is clear. No effusions or acute bony abnormality.  IMPRESSION: Large left pneumothorax without current evidence of tension.  Critical Value/emergent results were called by telephone at the time of interpretation on 04/24/2013 at 7:11 PM to Dr. Nat [name] , who verbally acknowledged these results.   Electronically Signed   By: Rolm Baptise M.D.   On: 04/24/2013 19:12     EKG Interpretation   Date/Time:  Wednesday April 24 2013 17:54:37 EDT Ventricular Rate:  95 PR Interval:  146 QRS Duration: 82 QT Interval:  328 QTC Calculation: 412 R Axis:   78 Text Interpretation:  Normal sinus rhythm Nonspecific ST abnormality  Abnormal ECG When compared with ECG of 17-Apr-2013 11:53, No significant  change was found Confirmed by Lacinda Axon  MD, Hulon Ferron ([id number]) on 04/24/2013 6:04:21  PM     CRITICAL CARE Performed by: William [name]  ?  Total critical care time: 30  Critical care time was exclusive of separately billable procedures and treating other patients.  Critical care was necessary to treat or prevent imminent or life-threatening deterioration.  Critical care was time spent personally by me on the following activities: development of treatment plan with patient and/or surrogate as well as nursing, discussions with consultants, evaluation of patient's response to treatment, examination of patient, obtaining history from patient or surrogate, ordering and performing treatments and interventions, ordering and review of laboratory studies, ordering and review of radiographic studies, pulse oximetry and re-evaluation of patient's condition..  MDM   Final diagnoses:  Pneumothorax, left    Discussed with Dr. Nils Pyle.  He will accept patient in transfer. Patient is hemodynamically stable.   I personally performed the services described in this documentation, which was scribed  in my presence. The recorded information has been reviewed and is accurate.    William Christen, MD 04/25/13 [phone]

## 2013-04-24 NOTE — ED Notes (Signed)
Pt to be transported to [facility] ED.  Transport by [employer].  Report called to [facility], ED Charge Nurse.

## 2013-04-24 NOTE — ED Notes (Signed)
Patient complaining of sharp, central chest pain. Recently seen in ED for same. Reports pain worsened today.

## 2013-04-24 NOTE — Progress Notes (Signed)
CT Surgery  53 yo admitted wit 1st time L spont pneumothorax, 50% Had R VATS for recurrent pntx in 2010 by Dr Arlyce Dice PMHx- HIV treated in ID clinic,  Hx hep c Smokes 1/2 PPD  L 20 F chest tube placed in ED under local 1% pntx CXR pending  Plan Admit to 2 W for chest tube therapy Pt has moderate air leak

## 2013-04-24 NOTE — ED Notes (Signed)
Parke Simmers, RN from [place] requested that we waited approx 15 minutes before bringing the patient upstairs.

## 2013-04-25 ENCOUNTER — Inpatient Hospital Stay (HOSPITAL_COMMUNITY): Payer: Medicare Other

## 2013-04-25 LAB — APTT: aPTT: 27 seconds (ref 24–37)

## 2013-04-25 LAB — CBC
HCT: 34.3 % — ABNORMAL LOW (ref 39.0–52.0)
Hemoglobin: 11.9 g/dL — ABNORMAL LOW (ref 13.0–17.0)
MCH: 29.2 pg (ref 26.0–34.0)
MCHC: 34.7 g/dL (ref 30.0–36.0)
MCV: 84.1 fL (ref 78.0–100.0)
Platelets: 214 10*3/uL (ref 150–400)
RBC: 4.08 MIL/uL — ABNORMAL LOW (ref 4.22–5.81)
RDW: 13.3 % (ref 11.5–15.5)
WBC: 6 10*3/uL (ref 4.0–10.5)

## 2013-04-25 LAB — URINALYSIS, ROUTINE W REFLEX MICROSCOPIC
Bilirubin Urine: NEGATIVE
Glucose, UA: NEGATIVE mg/dL
Hgb urine dipstick: NEGATIVE
Ketones, ur: NEGATIVE mg/dL
Leukocytes, UA: NEGATIVE
Nitrite: NEGATIVE
Protein, ur: NEGATIVE mg/dL
Specific Gravity, Urine: 1.027 (ref 1.005–1.030)
Urobilinogen, UA: 1 mg/dL (ref 0.0–1.0)
pH: 6.5 (ref 5.0–8.0)

## 2013-04-25 LAB — BLOOD GAS, ARTERIAL
Acid-base deficit: 2.4 mmol/L — ABNORMAL HIGH (ref 0.0–2.0)
Bicarbonate: 21.5 mEq/L (ref 20.0–24.0)
Drawn by: 331471
O2 Saturation: 91.3 %
Patient temperature: 98.6
TCO2: 22.6 mmol/L (ref 0–100)
pCO2 arterial: 34.8 mmHg — ABNORMAL LOW (ref 35.0–45.0)
pH, Arterial: 7.407 (ref 7.350–7.450)
pO2, Arterial: 62.8 mmHg — ABNORMAL LOW (ref 80.0–100.0)

## 2013-04-25 LAB — PREPARE RBC (CROSSMATCH)

## 2013-04-25 LAB — PROTIME-INR
INR: 1.06 (ref 0.00–1.49)
Prothrombin Time: 13.6 seconds (ref 11.6–15.2)

## 2013-04-25 LAB — ABO/RH: ABO/RH(D): A POS

## 2013-04-25 MED ORDER — MEGESTROL ACETATE 40 MG/ML PO SUSP
800.0000 mg | Freq: Every day | ORAL | Status: DC
  Administered 2013-04-25 – 2013-04-30 (×5): 800 mg via ORAL
  Filled 2013-04-25 (×6): qty 20

## 2013-04-25 MED ORDER — DEXTROSE 5 % IV SOLN
1.5000 g | INTRAVENOUS | Status: AC
  Administered 2013-04-26: 1.5 g via INTRAVENOUS
  Filled 2013-04-25: qty 1.5

## 2013-04-25 MED ORDER — IOHEXOL 300 MG/ML  SOLN
80.0000 mL | Freq: Once | INTRAMUSCULAR | Status: AC | PRN
  Administered 2013-04-25: 80 mL via INTRAVENOUS

## 2013-04-25 NOTE — H&P (Signed)
[address]       [place] [address]             [phone]        William [name] [place] Medical Record #[id number] Date of Birth: [birth year]  Referring: No ref. provider found Primary Care: No primary provider on file.  Chief Complaint:    Chief Complaint  Patient presents with  . Chest Pain  Patient examined, chest x-ray reviewed  History of Present Illness:     53 year old Afro-American male presents with left sided chest pain shortness of breath and a 50% left spontaneous pneumothorax. The patient developed symptoms 4 hours prior to presentation at [facility] ED. He was transferred from the outside ED with stable oxygen saturation on nasal cannula  after an evaluation by the the staff at that hospital recommended that the patient be transported with a known 50% pneumothorax without a chest tube or catheter for decompression for thoracic surgical evaluation here.I evaluated the patient here shortly after presentation, previous chest x-ray and recommended urgent left chest tube placement for a large left pneumothorax.  The patient is a smoker. The patient had a right spontaneous pneumothorax, recurrent which was treated with right VATS stapling of blebs and pleurodesis  by Dr. Arlyce Dice 2010. Review CT scan at that time showed bilateral apical blebs.  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 The patient denies symptoms of fever cough or malaise  A 20 French left chest tube was placed in the ED for decompression of the left spontaneous pneumothorax.  Current Activity/ Functional Status: Patient currently  not employed lives with his wife and family smokes half a pack of cigarettes a day   Zubrod Score: At the time of surgery this patient's most appropriate activity status/level  should be described as: []     0    Normal activity, no symptoms []     1    Restricted in physical strenuous activity but ambulatory, able to do out light work [x]     2    Ambulatory and capable of self care, unable to do work activities, up and about                 more than 50%  Of the time                            []     3    Only limited self care, in bed greater than 50% of waking hours []     4    Completely disabled, no self care, confined to bed or chair []     5    Moribund  Past Medical History  Diagnosis Date  . HIV (human immunodeficiency virus infection)   . Pneumothorax     Past Surgical History  Procedure Laterality Date  . Chest tube insertion      History  Smoking status  . Current Every Day Smoker -- 0.40 packs/day for 15 years  . Types: Cigarettes  Smokeless tobacco  . Never Used    History  Alcohol Use No    History   Social History  . Marital Status: Married    Spouse Name: N/A    Number of Children: N/A  . Years of Education: N/A  Occupational History  . Not on file.   Social History Main Topics  . Smoking status: Current Every Day Smoker -- 0.40 packs/day for 15 years    Types: Cigarettes  . Smokeless tobacco: Never Used  . Alcohol Use: No  . Drug Use: No  . Sexual Activity: Not Currently     Comment: given condoms   Other Topics Concern  . Not on file   Social History Narrative  . No narrative on file    No Known Allergies  Current Facility-Administered Medications  Medication Dose Route Frequency Provider Last Rate Last Dose  . 0.9 %  sodium chloride infusion   Intravenous Continuous Ivin Poot, MD 50 mL/hr at 04/24/13 2200    . 0.9 %  sodium chloride infusion   Intravenous Continuous Ivin Poot, MD      . acetaminophen (TYLENOL) tablet 650 mg  650 mg Oral Q6H PRN Ivin Poot, MD       Or  . acetaminophen (TYLENOL) suppository 650 mg  650 mg Rectal Q6H PRN Ivin Poot, MD      . albuterol (PROVENTIL) (2.5  MG/3ML) 0.083% nebulizer solution 2.5 mg  2.5 mg Nebulization Q6H PRN Ivin Poot, MD      . diphenhydrAMINE (BENADRYL) injection 12.5 mg  12.5 mg Intravenous Q6H PRN Ivin Poot, MD       Or  . diphenhydrAMINE (BENADRYL) 12.5 MG/5ML elixir 12.5 mg  12.5 mg Oral Q6H PRN Ivin Poot, MD      . docusate sodium (COLACE) capsule 100 mg  100 mg Oral BID Ivin Poot, MD      . elvitegravir-cobicistat-emtricitabine-tenofovir (STRIBILD) 150-150-200-300 MG tablet 1 tablet  1 tablet Oral Q breakfast Ivin Poot, MD   1 tablet at 04/25/13 [phone]  . enoxaparin (LOVENOX) injection 40 mg  40 mg Subcutaneous Q24H Ivin Poot, MD      . fentaNYL 10 mcg/mL PCA injection   Intravenous 6 times per day Ivin Poot, MD      . megestrol (MEGACE) 40 MG/ML suspension 800 mg  800 mg Oral Daily Coolidge Breeze, PA-C      . naloxone [facility]) injection 0.4 mg  0.4 mg Intravenous PRN Ivin Poot, MD       And  . sodium chloride 0.9 % injection 9 mL  9 mL Intravenous PRN Ivin Poot, MD      . nicotine (NICODERM CQ - dosed in mg/24 hours) patch 14 mg  14 mg Transdermal Q24H Ivin Poot, MD   14 mg at 04/25/13 0100  . ondansetron (ZOFRAN) injection 4 mg  4 mg Intravenous Q6H PRN Ivin Poot, MD      . ondansetron [facility]) tablet 4 mg  4 mg Oral Q6H PRN Ivin Poot, MD      . oxyCODONE (Oxy IR/ROXICODONE) immediate release tablet 5 mg  5 mg Oral Q4H PRN Ivin Poot, MD   5 mg at 04/24/13 2237  . polyethylene glycol (MIRALAX / GLYCOLAX) packet 17 g  17 g Oral Daily PRN Ivin Poot, MD      . sodium chloride 0.9 % injection 3 mL  3 mL Intravenous Q12H Ivin Poot, MD      . sorbitol 70 % solution 30 mL  30 mL Oral Daily PRN Ivin Poot, MD      . sulfamethoxazole-trimethoprim (BACTRIM DS) 800-160 MG per tablet 1 tablet  1 tablet Oral  Daily Ivin Poot, MD        Prescriptions prior to admission  Medication Sig Dispense Refill  . albuterol (PROVENTIL HFA;VENTOLIN  HFA) 108 (90 BASE) MCG/ACT inhaler Inhale 2 puffs into the lungs every 6 (six) hours as needed for wheezing or shortness of breath.      . elvitegravir-cobicistat-emtricitabine-tenofovir (STRIBILD) 150-150-200-300 MG TABS tablet Take 1 tablet by mouth daily with breakfast.      . loratadine (CLARITIN) 10 MG tablet Take 1 tablet (10 mg total) by mouth daily.  30 tablet  6  . megestrol (MEGACE) 400 MG/10ML suspension Take 20 mLs (800 mg total) by mouth daily.  240 mL  5  . sulfamethoxazole-trimethoprim (BACTRIM DS) 800-160 MG per tablet Take 1 tablet by mouth daily.  30 tablet  4    History reviewed. No pertinent family history.   Review of Systems: positive for treated HIV and hepatitis C with minimally elevated LFTs    Cardiac Review of Systems: Y or N  Chest Pain [  Y.  ]  Resting SOB [ Y.  ] Exertional SOB  [Y. ]  Orthopnea [ Y. ]   Pedal Edema [  no ]    Palpitations [no  ] Syncope  [ no ]   Presyncope [ no  ]  General Review of Systems: [Y] = yes [  ]=no Constitional: recent weight change [  ]; anorexia [  ]; fatigue [  ]; nausea [  ]; night sweats [  ]; fever [  ]; or chills [  ]                                                               Dental: poor dentition[yes  ]; Last Dentist visit:greater than one year   Eye : blurred vision [  ]; diplopia [   ]; vision changes [  ];  Amaurosis fugax[  ]; Resp: cough [  ];  wheezing[  ];  hemoptysis[  ]; shortness of breath[  ]; paroxysmal nocturnal dyspnea[yes  ]; dyspnea on exertion[yes  ]; or orthopnea[  ];  GI:  gallstones[  ], vomiting[  ];  dysphagia[  ]; melena[  ];  hematochezia [  ]; heartburn[  ];   Hx of  Colonoscopy[  ]; GU: kidney stones [  ]; hematuria[  ];   dysuria [  ];  nocturia[  ];  history of     obstruction [  ]; urinary frequency [  ]             Skin: rash, swelling[  ];, hair loss[  ];  peripheral edema[  ];  or itching[  ]; Musculosketetal: myalgias[  ];  joint swelling[  ];  joint erythema[  ];  joint pain[  ];  back  pain[  ];  Heme/Lymph: bruising[  ];  bleeding[  ];  anemia[  ];  Neuro: TIA[  ];  headaches[  ];  stroke[  ];  vertigo[  ];  seizures[  ];   paresthesias[  ];  difficulty walking[  ];  Psych:depression[  ]; anxiety[  ];  Endocrine: diabetes[  ];  thyroid dysfunction[  ];  Immunizations: Flu [  ]; Pneumococcal[  ];  Other:  Physical Exam: BP 114/80  Pulse 83  Temp(Src) 98.4 F (36.9 C) (Oral)  Resp 16  Ht 5\' 11"  (1.803 m)  Wt 158 lb 1.1 oz (71.7 kg)  BMI 22.06 kg/m2  SpO2 99%  General appearance-middle-aged Afro-American male mild distress on oxygen HEENT normocephalic poor dentition Neck without JVD crepitus or mass Breath sounds reduced on left, surgical scars from right VATS well-healed Cardiac rhythm regular without murmur or rub Abdomen soft without mass Extremities without edema tenderness or cyanosis Neurological without focal motor deficit   Diagnostic Studies & Laboratory data:     Recent Radiology Findings:   Dg Chest 2 View  04/24/2013   CLINICAL DATA:  Sharp chest pain.  EXAM: CHEST  2 VIEW  COMPARISON:  DG CHEST 1V PORT dated 04/19/2013  FINDINGS: There is a large left pneumothorax with near complete collapse of the left lung. No evidence of tension. Heart is normal size. Right lung is clear. No effusions or acute bony abnormality.  IMPRESSION: Large left pneumothorax without current evidence of tension.  Critical Value/emergent results were called by telephone at the time of interpretation on 04/24/2013 at 7:11 PM to Dr. [name] , who verbally acknowledged these results.   Electronically Signed   By: Rolm Baptise M.D.   On: 04/24/2013 19:12   Portable Chest 1 View  04/25/2013   CLINICAL DATA:  Left pneumothorax.  EXAM: PORTABLE CHEST - 1 VIEW  COMPARISON:  04/24/2013  FINDINGS: Left pneumothorax has significantly increased. Chest tube remains in good position. The right lung is now clear. Surgical staples are seen at the right apex. Heart size and vascularity are  normal.  IMPRESSION: Increased left pneumothorax, probably 30%.   Electronically Signed   By: Rozetta Nunnery M.D.   On: 04/25/2013 08:06   Dg Chest Port 1 View  04/24/2013   CLINICAL DATA:  Chest tube placement.  EXAM: PORTABLE CHEST - 1 VIEW  COMPARISON:  PA and lateral chest 04/24/2013 at 1846 hr.  FINDINGS: The patient has a new left chest tube. The left lung is nearly completely re-expanded. There may be tiny left apical pneumothorax. Coarsened pulmonary interstitium is identified. Suture material in the right lung apex is noted. Heart size is normal.  IMPRESSION: Near complete re-expansion of the left lung after chest tube placement. No other change.   Electronically Signed   By: Inge Rise M.D.   On: 04/24/2013 22:08      Recent Lab Findings: Lab Results  Component Value Date   WBC 7.4 04/24/2013   HGB 12.6* 04/24/2013   HCT 36.2* 04/24/2013   PLT 226 04/24/2013   GLUCOSE 109* 04/24/2013   CHOL 165 03/05/2013   TRIG 54 03/05/2013   HDL 33* 03/05/2013   LDLCALC 121* 03/05/2013   ALT 75* 04/24/2013   AST 43* 04/24/2013   NA 140 04/24/2013   K 4.3 04/24/2013   CL 105 04/24/2013   CREATININE 1.02 04/24/2013   BUN 13 04/24/2013   CO2 23 04/24/2013   INR 1.02 04/24/2013      Assessment / Plan:     53 year old presents with large left spontaneous pneumothorax-first episode He is status post surgical therapy for recurrent right spontaneous pneumothorax in 2010--VATS with bleb stapling and pleurodesis  A left chest tube was placed in the ED with successful reexpansion of the lung, however there is a large residual airleak and I expect the patient will need surgical therapy for his left spontaneous pneumothorax. Patient will be admitted for observation and care.     @ME1 @ 04/25/2013  9:51 AM

## 2013-04-25 NOTE — Op Note (Signed)
William [name], William [name]             ACCOUNT NO.:  [id number]  MEDICAL RECORD NO.:  [id number]  LOCATION:  [id number]                        FACILITY:  [facility]  PHYSICIAN:  Ivin Poot, M.D.  DATE OF BIRTH:  11-19-1960  DATE OF PROCEDURE:  04/24/2013 DATE OF DISCHARGE:                              OPERATIVE REPORT   OPERATION:  Placement of left chest tube.  PREOPERATIVE DIAGNOSIS:  Spontaneous 50% left pneumothorax.  POSTOPERATIVE DIAGNOSIS:  Spontaneous 50% left pneumothorax.  SURGEON:  Ivin Poot, M.D.  ANESTHESIA:  Local 1% lidocaine with IV conscious monitored sedation.  CLINICAL NOTE:  The patient is a 53 year old male smoker, who presents with acute chest pain, shortness of breath, and was found to have a 50% left spontaneous pneumothorax by x-ray in the emergency department.  He was transferred to this hospital.  He has past history of a previous right knee VATS for recurrent right spontaneous pneumothorax.  A left chest tube was recommended to the patient, who understood and accepted to proceed under informed consent.  PROCEDURE IN DETAIL:  A proper time-out was performed.  The left chest was prepped and draped in usual sterile field.  Local 1% lidocaine was infiltrated in the inframammary crease.  A small incision was made.  IV fentanyl and Versed were administered.  Further local anesthesia was infiltrated into the intercostal muscles in the fifth interspace.  Using hemostats the left pleural space was entered and spontaneous exit of air under pressure was noted.  Through the incision, a 20-French chest tube was inserted and directed to the apex, and connected to a Pleur-Evac underwater seal suction system.  Sutures were used to secure the tube and a sterile dressing was applied.  The patient will be admitted to the hospital for observation and care.  A followup chest x-ray showed re- expansion of the lung with a chest tube in good position.     Ivin Poot, M.D.     PV/MEDQ  D:  04/24/2013  T:  04/25/2013  Job:  [id number]

## 2013-04-25 NOTE — Progress Notes (Addendum)
[address]       [place] [address]             [phone]               Subjective: Feels better, breathing stable.    Objective: Vital signs in last 24 hours: Patient Vitals for the past 24 hrs:  BP Temp Temp src Pulse Resp SpO2 Height Weight  04/25/13 0507 114/80 mmHg 98.4 F (36.9 C) Oral 83 18 98 % - -  04/25/13 0455 - - - - 18 97 % - -  04/25/13 0140 - - - - 19 98 % - -  04/24/13 2349 134/94 mmHg 98.5 F (36.9 C) Oral 74 - 100 % - 158 lb 1.1 oz (71.7 kg)  04/24/13 2305 132/95 mmHg - - 81 - 99 % - -  04/24/13 2300 133/105 mmHg - - 83 - 98 % - -  04/24/13 2255 131/98 mmHg - - 83 - 96 % - -  04/24/13 2250 128/92 mmHg - - 81 - 95 % - -  04/24/13 2245 132/104 mmHg - - 81 - 98 % - -  04/24/13 2240 131/99 mmHg - - 82 - 97 % - -  04/24/13 2235 136/98 mmHg - - 83 - 95 % - -  04/24/13 2230 128/100 mmHg - - 87 - 95 % - -  04/24/13 2225 128/96 mmHg - - 88 - 94 % - -  04/24/13 2220 141/102 mmHg - - 89 - 96 % - -  04/24/13 2215 129/100 mmHg - - 90 - 96 % - -  04/24/13 2210 131/95 mmHg - - 90 - 95 % - -  04/24/13 2205 133/100 mmHg - - 90 - 96 % - -  04/24/13 2200 135/102 mmHg - - 94 - 100 % - -  04/24/13 2155 - - - 98 - 98 % - -  04/24/13 2150 134/101 mmHg - - 92 - 97 % - -  04/24/13 2145 131/98 mmHg - - 101 - 94 % - -  04/24/13 2140 118/94 mmHg - - 90 - 97 % - -  04/24/13 2138 128/95 mmHg - - 97 - 95 % - -  04/24/13 2115 119/96 mmHg - - 86 - 99 % - -  04/24/13 2100 133/99 mmHg - - 87 25 96 % - -  04/24/13 1951 131/95 mmHg - - 83 28 98 % - -  04/24/13 1930 132/91 mmHg - - 81 20 98 % - -  04/24/13 1831 - - - - - 95 % - -  04/24/13 1748 127/84 mmHg 98.1 F (36.7 C) Oral 94 22 95 % 5\' 11"  (1.803 m) 158 lb (71.668 kg)   Current Weight  04/24/13 158 lb 1.1 oz (71.7 kg)     Intake/Output from previous day: 03/18 0701 - 03/19 0700 In: -  Out: 450 [Urine:450]    PHYSICAL EXAM:  Heart: RRR Lungs: Few scattered wheezes bilaterally Chest  tube: + air leak    Lab Results: CBC: Recent Labs  04/24/13 2210  WBC 7.4  HGB 12.6*  HCT 36.2*  PLT 226   BMET:  Recent Labs  04/24/13 2210  NA 140  K 4.3  CL 105  CO2 23  GLUCOSE 109*  BUN 13  CREATININE 1.02  CALCIUM 9.1    PT/INR:  Recent Labs  04/24/13 2210  LABPROT 13.2  INR 1.02    CXR: FINDINGS:  Left pneumothorax  has significantly increased. Chest tube remains in  good position. The right lung is now clear. Surgical staples are  seen at the right apex. Heart size and vascularity are normal.  IMPRESSION:  Increased left pneumothorax, probably 30%   Assessment/Plan: S/P left CT for spontaneous ptx- CXR appears worse this am, but CT has been on water seal instead of suction, as ordered.  Will replace to  -20 cm suction and follow CXR. Pain controlled with PCA.    LOS: 1 day    COLLINS,GINA H 04/25/2013 Persistent large air leak Getting CT chest now  PATIENT WILL BE PREPARED FOR VATS BY DR Cyndia Bent AT 9AM TOMORROW Procedure d/w patient including plan for Dr Cyndia Bent to perform

## 2013-04-26 ENCOUNTER — Inpatient Hospital Stay (HOSPITAL_COMMUNITY): Payer: Medicare Other

## 2013-04-26 ENCOUNTER — Encounter (HOSPITAL_COMMUNITY): Payer: Self-pay | Admitting: Anesthesiology

## 2013-04-26 ENCOUNTER — Encounter (HOSPITAL_COMMUNITY)
Admission: EM | Disposition: A | Discharge status: Home / Self Care | Payer: Self-pay | Source: Home / Self Care | Attending: Surgery

## 2013-04-26 ENCOUNTER — Encounter (HOSPITAL_COMMUNITY): Payer: Medicare Other | Admitting: Anesthesiology

## 2013-04-26 ENCOUNTER — Inpatient Hospital Stay (HOSPITAL_COMMUNITY): Payer: Medicare Other | Admitting: Anesthesiology

## 2013-04-26 DIAGNOSIS — J9383 Other pneumothorax: Secondary | ICD-10-CM

## 2013-04-26 HISTORY — PX: STAPLING OF BLEBS: SHX6429

## 2013-04-26 HISTORY — PX: VIDEO ASSISTED THORACOSCOPY: SHX5073

## 2013-04-26 HISTORY — PX: PLEURADESIS: SHX6030

## 2013-04-26 LAB — COMPREHENSIVE METABOLIC PANEL
ALT: 64 U/L — ABNORMAL HIGH (ref 0–53)
AST: 37 U/L (ref 0–37)
Albumin: 3.6 g/dL (ref 3.5–5.2)
Alkaline Phosphatase: 67 U/L (ref 39–117)
BUN: 10 mg/dL (ref 6–23)
CO2: 21 mEq/L (ref 19–32)
Calcium: 8.9 mg/dL (ref 8.4–10.5)
Chloride: 99 mEq/L (ref 96–112)
Creatinine, Ser: 0.85 mg/dL (ref 0.50–1.35)
GFR calc Af Amer: >90 mL/min (ref >90)
GFR calc non Af Amer: >90 mL/min (ref >90)
Glucose, Bld: 149 mg/dL — ABNORMAL HIGH (ref 70–99)
Potassium: 3.9 mEq/L (ref 3.7–5.3)
Sodium: 135 mEq/L — ABNORMAL LOW (ref 137–147)
Total Bilirubin: 0.4 mg/dL (ref 0.3–1.2)
Total Protein: 7.5 g/dL (ref 6.0–8.3)

## 2013-04-26 SURGERY — VIDEO ASSISTED THORACOSCOPY
Anesthesia: General | Site: Chest | Laterality: Left

## 2013-04-26 MED ORDER — FENTANYL CITRATE 0.05 MG/ML IJ SOLN
INTRAMUSCULAR | Status: DC | PRN
  Administered 2013-04-26 (×3): 50 ug via INTRAVENOUS
  Administered 2013-04-26: 100 ug via INTRAVENOUS
  Administered 2013-04-26 (×3): 50 ug via INTRAVENOUS

## 2013-04-26 MED ORDER — ONDANSETRON HCL 4 MG/2ML IJ SOLN
INTRAMUSCULAR | Status: DC | PRN
  Administered 2013-04-26 (×2): 4 mg via INTRAVENOUS

## 2013-04-26 MED ORDER — PROPOFOL 10 MG/ML IV BOLUS
INTRAVENOUS | Status: AC
  Filled 2013-04-26: qty 20

## 2013-04-26 MED ORDER — HYDROMORPHONE HCL PF 1 MG/ML IJ SOLN
INTRAMUSCULAR | Status: AC
  Filled 2013-04-26: qty 1

## 2013-04-26 MED ORDER — DEXTROSE 5 % IV SOLN
1.5000 g | Freq: Two times a day (BID) | INTRAVENOUS | Status: AC
  Administered 2013-04-26 – 2013-04-27 (×2): 1.5 g via INTRAVENOUS
  Filled 2013-04-26 (×3): qty 1.5

## 2013-04-26 MED ORDER — METOCLOPRAMIDE HCL 5 MG/ML IJ SOLN
10.0000 mg | Freq: Four times a day (QID) | INTRAMUSCULAR | Status: DC
  Administered 2013-04-26 – 2013-04-30 (×15): 10 mg via INTRAVENOUS
  Filled 2013-04-26 (×19): qty 2

## 2013-04-26 MED ORDER — HYDROMORPHONE 0.3 MG/ML IV SOLN
INTRAVENOUS | Status: AC
  Filled 2013-04-26: qty 25

## 2013-04-26 MED ORDER — TRAMADOL HCL 50 MG PO TABS: 50.0000 mg | ORAL_TABLET | Freq: Four times a day (QID) | ORAL | Status: DC | PRN

## 2013-04-26 MED ORDER — OXYCODONE HCL 5 MG/5ML PO SOLN: 5.0000 mg | Freq: Once | ORAL | Status: DC | PRN

## 2013-04-26 MED ORDER — LACTATED RINGERS IV SOLN
INTRAVENOUS | Status: DC
  Administered 2013-04-26: 09:00:00 via INTRAVENOUS

## 2013-04-26 MED ORDER — POTASSIUM CHLORIDE 10 MEQ/50ML IV SOLN: 10.0000 meq | Freq: Every day | INTRAVENOUS | Status: DC | PRN

## 2013-04-26 MED ORDER — GLYCOPYRROLATE 0.2 MG/ML IJ SOLN
INTRAMUSCULAR | Status: AC
  Filled 2013-04-26: qty 3

## 2013-04-26 MED ORDER — DEXTROSE 5 % IV SOLN
1.5000 g | INTRAVENOUS | Status: DC | PRN
  Administered 2013-04-26: 1.5 g via INTRAVENOUS

## 2013-04-26 MED ORDER — LIDOCAINE HCL (CARDIAC) 20 MG/ML IV SOLN
INTRAVENOUS | Status: AC
  Filled 2013-04-26: qty 5

## 2013-04-26 MED ORDER — ONDANSETRON HCL 4 MG/2ML IJ SOLN: 4.0000 mg | Freq: Four times a day (QID) | INTRAMUSCULAR | Status: DC | PRN

## 2013-04-26 MED ORDER — MIDAZOLAM HCL 5 MG/5ML IJ SOLN
INTRAMUSCULAR | Status: DC | PRN
  Administered 2013-04-26: 2 mg via INTRAVENOUS

## 2013-04-26 MED ORDER — NEOSTIGMINE METHYLSULFATE 1 MG/ML IJ SOLN
INTRAMUSCULAR | Status: AC
  Filled 2013-04-26: qty 10

## 2013-04-26 MED ORDER — OXYCODONE-ACETAMINOPHEN 5-325 MG PO TABS
1.0000 | ORAL_TABLET | ORAL | Status: DC | PRN
  Administered 2013-04-27 – 2013-04-28 (×2): 1 via ORAL
  Administered 2013-04-28: 2 via ORAL
  Administered 2013-04-28: 1 via ORAL
  Administered 2013-04-30: 2 via ORAL
  Filled 2013-04-26: qty 1

## 2013-04-26 MED ORDER — GLYCOPYRROLATE 0.2 MG/ML IJ SOLN
INTRAMUSCULAR | Status: DC | PRN
  Administered 2013-04-26: .7 mg via INTRAVENOUS

## 2013-04-26 MED ORDER — DEXTROSE 5 % IV SOLN
INTRAVENOUS | Status: AC
  Filled 2013-04-26: qty 1.5

## 2013-04-26 MED ORDER — NALOXONE HCL 0.4 MG/ML IJ SOLN: 0.4000 mg | INTRAMUSCULAR | Status: DC | PRN

## 2013-04-26 MED ORDER — HYDROMORPHONE HCL PF 1 MG/ML IJ SOLN
0.2500 mg | INTRAMUSCULAR | Status: DC | PRN
  Administered 2013-04-26 (×4): 0.5 mg via INTRAVENOUS

## 2013-04-26 MED ORDER — PROPOFOL 10 MG/ML IV BOLUS
INTRAVENOUS | Status: DC | PRN
  Administered 2013-04-26: 180 mg via INTRAVENOUS

## 2013-04-26 MED ORDER — LIDOCAINE HCL (CARDIAC) 20 MG/ML IV SOLN
INTRAVENOUS | Status: DC | PRN
  Administered 2013-04-26: 100 mg via INTRAVENOUS

## 2013-04-26 MED ORDER — HYDROMORPHONE 0.3 MG/ML IV SOLN
INTRAVENOUS | Status: DC
  Administered 2013-04-26: 13:00:00 via INTRAVENOUS
  Administered 2013-04-26 – 2013-04-27 (×5): 2.4 mg via INTRAVENOUS
  Administered 2013-04-27: 1.98 mg via INTRAVENOUS
  Administered 2013-04-27 – 2013-04-28 (×3): via INTRAVENOUS
  Administered 2013-04-28: 3 mg via INTRAVENOUS
  Administered 2013-04-28: 2.86 mg via INTRAVENOUS
  Administered 2013-04-28: 4.92 mg via INTRAVENOUS
  Administered 2013-04-28: 04:00:00 via INTRAVENOUS
  Administered 2013-04-29: 5.52 mg via INTRAVENOUS
  Administered 2013-04-29: 01:00:00 via INTRAVENOUS
  Administered 2013-04-29: 2.1 mg via INTRAVENOUS
  Administered 2013-04-29: 3.9 mg via INTRAVENOUS
  Administered 2013-04-29: 2.4 mg via INTRAVENOUS
  Administered 2013-04-29: 3 mg via INTRAVENOUS
  Administered 2013-04-29: 12:00:00 via INTRAVENOUS
  Administered 2013-04-29: 4.5 mg via INTRAVENOUS
  Administered 2013-04-30: 1.2 mg via INTRAVENOUS
  Administered 2013-04-30: 3.9 mg via INTRAVENOUS
  Filled 2013-04-26 (×7): qty 25

## 2013-04-26 MED ORDER — DIPHENHYDRAMINE HCL 50 MG/ML IJ SOLN: 12.5000 mg | Freq: Four times a day (QID) | INTRAMUSCULAR | Status: DC | PRN

## 2013-04-26 MED ORDER — SENNOSIDES-DOCUSATE SODIUM 8.6-50 MG PO TABS
1.0000 | ORAL_TABLET | Freq: Every evening | ORAL | Status: DC | PRN
  Administered 2013-04-28: 1 via ORAL
  Filled 2013-04-26 (×4): qty 1

## 2013-04-26 MED ORDER — KETOROLAC TROMETHAMINE 15 MG/ML IJ SOLN: 15.0000 mg | Freq: Four times a day (QID) | INTRAMUSCULAR | Status: DC | PRN

## 2013-04-26 MED ORDER — LUNG SURGERY BOOK
Freq: Once | Status: DC
  Filled 2013-04-26: qty 1

## 2013-04-26 MED ORDER — ROCURONIUM BROMIDE 100 MG/10ML IV SOLN
INTRAVENOUS | Status: DC | PRN
  Administered 2013-04-26: 50 mg via INTRAVENOUS
  Administered 2013-04-26 (×4): 10 mg via INTRAVENOUS

## 2013-04-26 MED ORDER — ACETAMINOPHEN 160 MG/5ML PO SOLN
1000.0000 mg | Freq: Four times a day (QID) | ORAL | Status: AC
  Filled 2013-04-26 (×4): qty 40

## 2013-04-26 MED ORDER — DIPHENHYDRAMINE HCL 12.5 MG/5ML PO ELIX: 12.5000 mg | ORAL_SOLUTION | Freq: Four times a day (QID) | ORAL | Status: DC | PRN

## 2013-04-26 MED ORDER — NEOSTIGMINE METHYLSULFATE 1 MG/ML IJ SOLN
INTRAMUSCULAR | Status: DC | PRN
  Administered 2013-04-26: 3.5 mg via INTRAVENOUS

## 2013-04-26 MED ORDER — SODIUM CHLORIDE 0.9 % IJ SOLN: 9.0000 mL | INTRAMUSCULAR | Status: DC | PRN

## 2013-04-26 MED ORDER — ROCURONIUM BROMIDE 50 MG/5ML IV SOLN
INTRAVENOUS | Status: AC
  Filled 2013-04-26: qty 1

## 2013-04-26 MED ORDER — ONDANSETRON HCL 4 MG/2ML IJ SOLN
INTRAMUSCULAR | Status: AC
  Filled 2013-04-26: qty 2

## 2013-04-26 MED ORDER — PROMETHAZINE HCL 25 MG/ML IJ SOLN: 6.2500 mg | INTRAMUSCULAR | Status: DC | PRN

## 2013-04-26 MED ORDER — FENTANYL CITRATE 0.05 MG/ML IJ SOLN
INTRAMUSCULAR | Status: AC
  Filled 2013-04-26: qty 5

## 2013-04-26 MED ORDER — BISACODYL 5 MG PO TBEC
10.0000 mg | DELAYED_RELEASE_TABLET | Freq: Every day | ORAL | Status: DC
  Administered 2013-04-27 – 2013-04-30 (×4): 10 mg via ORAL
  Filled 2013-04-26 (×4): qty 2

## 2013-04-26 MED ORDER — TALC 5 G PL SUSR
INTRAPLEURAL | Status: AC
  Filled 2013-04-26: qty 5

## 2013-04-26 MED ORDER — ACETAMINOPHEN 500 MG PO TABS
1000.0000 mg | ORAL_TABLET | Freq: Four times a day (QID) | ORAL | Status: AC
  Administered 2013-04-26 – 2013-04-27 (×3): 1000 mg via ORAL
  Filled 2013-04-26 (×3): qty 2

## 2013-04-26 MED ORDER — MIDAZOLAM HCL 2 MG/2ML IJ SOLN
INTRAMUSCULAR | Status: AC
  Filled 2013-04-26: qty 2

## 2013-04-26 MED ORDER — OXYCODONE-ACETAMINOPHEN 5-325 MG PO TABS
1.0000 | ORAL_TABLET | ORAL | Status: DC | PRN
  Filled 2013-04-26 (×2): qty 2
  Filled 2013-04-26: qty 1
  Filled 2013-04-26: qty 2

## 2013-04-26 MED ORDER — KCL IN DEXTROSE-NACL 20-5-0.9 MEQ/L-%-% IV SOLN
INTRAVENOUS | Status: DC
  Administered 2013-04-26 – 2013-04-28 (×4): via INTRAVENOUS
  Administered 2013-04-30: 1000 mL via INTRAVENOUS
  Filled 2013-04-26 (×8): qty 1000

## 2013-04-26 MED ORDER — LACTATED RINGERS IV SOLN
INTRAVENOUS | Status: DC | PRN
  Administered 2013-04-26: 08:00:00 via INTRAVENOUS

## 2013-04-26 MED ORDER — OXYCODONE HCL 5 MG PO TABS: 5.0000 mg | ORAL_TABLET | ORAL | Status: AC | PRN

## 2013-04-26 MED ORDER — OXYCODONE HCL 5 MG PO TABS: 5.0000 mg | ORAL_TABLET | Freq: Once | ORAL | Status: DC | PRN

## 2013-04-26 SURGICAL SUPPLY — 72 items
ADH SKN CLS APL DERMABOND .7 (GAUZE/BANDAGES/DRESSINGS) ×2
BAG SPEC RTRVL LRG 6X4 10 (ENDOMECHANICALS) ×2
CANISTER SUCTION 2500CC (MISCELLANEOUS) ×8 IMPLANT
CATH KIT ON Q 5IN SLV (PAIN MANAGEMENT) IMPLANT
CATH THORACIC 28FR (CATHETERS) IMPLANT
CATH THORACIC 36FR (CATHETERS) IMPLANT
CATH THORACIC 36FR RT ANG (CATHETERS) IMPLANT
CLEANER TIP ELECTROSURG 2X2 (MISCELLANEOUS) ×2 IMPLANT
CONT SPEC 4OZ CLIKSEAL STRL BL (MISCELLANEOUS) ×8 IMPLANT
COVER SURGICAL LIGHT HANDLE (MISCELLANEOUS) ×6 IMPLANT
DERMABOND ADVANCED (GAUZE/BANDAGES/DRESSINGS) ×2
DERMABOND ADVANCED .7 DNX12 (GAUZE/BANDAGES/DRESSINGS) IMPLANT
DRAPE CAMERA VIDEO/LASER (DRAPES) IMPLANT
DRAPE LAPAROSCOPIC ABDOMINAL (DRAPES) ×4 IMPLANT
DRAPE WARM FLUID 44X44 (DRAPE) ×4 IMPLANT
ELECT REM PT RETURN 9FT ADLT (ELECTROSURGICAL) ×4
ELECTRODE REM PT RTRN 9FT ADLT (ELECTROSURGICAL) ×2 IMPLANT
GAUZE XEROFORM 1X8 LF (GAUZE/BANDAGES/DRESSINGS) ×2 IMPLANT
GLOVE BIO SURGEON STRL SZ 6 (GLOVE) ×4 IMPLANT
GLOVE BIOGEL PI IND STRL 6.5 (GLOVE) IMPLANT
GLOVE BIOGEL PI IND STRL 7.0 (GLOVE) IMPLANT
GLOVE BIOGEL PI INDICATOR 6.5 (GLOVE) ×8
GLOVE BIOGEL PI INDICATOR 7.0 (GLOVE) ×8
GLOVE EUDERMIC 7 POWDERFREE (GLOVE) ×8 IMPLANT
GOWN STRL REUS W/ TWL LRG LVL3 (GOWN DISPOSABLE) ×4 IMPLANT
GOWN STRL REUS W/ TWL XL LVL3 (GOWN DISPOSABLE) ×2 IMPLANT
GOWN STRL REUS W/TWL LRG LVL3 (GOWN DISPOSABLE) ×8
GOWN STRL REUS W/TWL XL LVL3 (GOWN DISPOSABLE) ×4
HANDLE STAPLE ENDO GIA SHORT (STAPLE) ×2
KIT BASIN OR (CUSTOM PROCEDURE TRAY) ×4 IMPLANT
KIT ROOM TURNOVER OR (KITS) ×4 IMPLANT
NS IRRIG 1000ML POUR BTL (IV SOLUTION) ×8 IMPLANT
PACK CHEST (CUSTOM PROCEDURE TRAY) ×4 IMPLANT
PAD ARMBOARD 7.5X6 YLW CONV (MISCELLANEOUS) ×8 IMPLANT
POUCH SPECIMEN RETRIEVAL 10MM (ENDOMECHANICALS) ×2 IMPLANT
RELOAD EGIA 60 MED/THCK PURPLE (STAPLE) ×12 IMPLANT
RELOAD STAPLE 45 3.5 BLU ETS (ENDOMECHANICALS) IMPLANT
RELOAD STAPLE 60 MED/THCK ART (STAPLE) IMPLANT
RELOAD STAPLE TA45 3.5 REG BLU (ENDOMECHANICALS) ×16 IMPLANT
SEALANT SURG COSEAL 4ML (VASCULAR PRODUCTS) IMPLANT
SEALANT SURG COSEAL 8ML (VASCULAR PRODUCTS) IMPLANT
SOLUTION ANTI FOG 6CC (MISCELLANEOUS) ×2 IMPLANT
SPECIMEN JAR MEDIUM (MISCELLANEOUS) ×4 IMPLANT
SPONGE GAUZE 4X4 12PLY (GAUZE/BANDAGES/DRESSINGS) ×4 IMPLANT
STAPLER ENDO GIA 12 SHRT THIN (STAPLE) IMPLANT
STAPLER ENDO GIA 12MM SHORT (STAPLE) ×2 IMPLANT
STAPLER ENDO NO KNIFE (STAPLE) ×2 IMPLANT
SUT PROLENE 3 0 SH DA (SUTURE) IMPLANT
SUT PROLENE 4 0 RB 1 (SUTURE)
SUT PROLENE 4-0 RB1 .5 CRCL 36 (SUTURE) IMPLANT
SUT SILK  1 MH (SUTURE)
SUT SILK 1 MH (SUTURE) IMPLANT
SUT SILK 2 0SH CR/8 30 (SUTURE) IMPLANT
SUT VIC AB 1 CTX 36 (SUTURE)
SUT VIC AB 1 CTX36XBRD ANBCTR (SUTURE) IMPLANT
SUT VIC AB 2 TP1 27 (SUTURE) IMPLANT
SUT VIC AB 2-0 CT1 27 (SUTURE) ×4
SUT VIC AB 2-0 CT1 TAPERPNT 27 (SUTURE) IMPLANT
SUT VIC AB 2-0 CTX 36 (SUTURE) IMPLANT
SUT VIC AB 2-0 UR6 27 (SUTURE) ×2 IMPLANT
SUT VIC AB 3-0 MH 27 (SUTURE) IMPLANT
SUT VIC AB 3-0 SH 27 (SUTURE) ×4
SUT VIC AB 3-0 SH 27X BRD (SUTURE) IMPLANT
SUT VIC AB 3-0 X1 27 (SUTURE) ×2 IMPLANT
SYSTEM SAHARA CHEST DRAIN ATS (WOUND CARE) ×4 IMPLANT
TAPE CLOTH SURG 4X10 WHT LF (GAUZE/BANDAGES/DRESSINGS) ×2 IMPLANT
TIP APPLICATOR SPRAY EXTEND 16 (VASCULAR PRODUCTS) IMPLANT
TOWEL OR 17X24 6PK STRL BLUE (TOWEL DISPOSABLE) ×4 IMPLANT
TOWEL OR 17X26 10 PK STRL BLUE (TOWEL DISPOSABLE) ×4 IMPLANT
TRAP SPECIMEN MUCOUS 40CC (MISCELLANEOUS) IMPLANT
TRAY FOLEY CATH 14FRSI W/METER (CATHETERS) ×4 IMPLANT
WATER STERILE IRR 1000ML POUR (IV SOLUTION) ×8 IMPLANT

## 2013-04-26 NOTE — Op Note (Signed)
CARDIOTHORACIC SURGERY OPERATIVE NOTE:  William [name] [id number] 04/26/2013   Preoperative Dx:  Spontaneous left pneumothorax  Postoperative Dx: same   Procedure: left  video-assisted thoracoscopy, stapling of multiple blebs, mechanical pleurodesis.  Surgeon: Dr. Gaye Pollack   Assistant: Ellwood Handler, PA-C  Anesthesia: GET   Clinical History:   53 year old Afro-American male presents with left sided chest pain shortness of breath and a 50% left spontaneous pneumothorax. The patient developed symptoms 4 hours prior to presentation at [facility] ED. He was transferred from the outside ED with stable oxygen saturation on nasal cannula after an evaluation by the the staff at that hospital recommended that the patient be transported with a known 50% pneumothorax without a chest tube or catheter for decompression for thoracic surgical evaluation here.I evaluated the patient here shortly after presentation, previous chest x-ray and recommended urgent left chest tube placement for a large left pneumothorax.  The patient is a smoker. The patient had a right spontaneous pneumothorax, recurrent which was treated with right VATS stapling of blebs and pleurodesis by me in 2010. Review CT scan at that time showed bilateral apical blebs as well as large blebs along the medial aspect of both upper lobes.  The patient's medical history is significant for treated HIV followed by the ID clinic at , hepatitis C.he hasbeen treated in the past for pneumocystis pneumoniaand is currently on prophylactic Septra DS.  The patient denies symptoms of fever cough or malaise  A 20 French left chest tube was placed in the ED for decompression of the left spontaneous pneumothorax. There was a large residual air leak and it was felt that left VATS for bleb stapling and pleurodesis was indicated. I discussed the operative procedure with the patient  including alternatives, benefits and risks; including but  not limited to bleeding, blood transfusion, infection, and recurrence of the pneumothorax.  Idelia Salm understands and agrees to proceed.       Operative procedure:   The patient was seen in the preoperative holding area. The proper patient, proper operative side, proper operation were confirmed after reviewing his history and chest x-ray. The left side of the chest was signed by me. Preoperative intravenous antibiotics were given. He was taken back to the operating room and placed on table in the supine position. After induction of general endotracheal anesthesia using a double-lumen tube, a Foley catheter was placed in the bladder using sterile technique. Lower extremity sequential compression devices were used. The patient was positioned in the right lateral decubitus position with the left side up. The previously placed chest tube was removed. The left side of the chest was prepped with Betadine soap and solution and draped in the usual sterile manner. A timeout was taken and the proper patient, proper operation, and proper operative side were confirmed with nursing and anesthesia staff. A 1 cm incision was made in the midaxillary line at about the eighth intercostal space. The left pleural space was entered bluntly with a hemostat and an 8 mm trocar was inserted. The 30 thoracoscope was inserted and the pleural space inspected. There were 2 large blebs at the apex of the lung. Then a second 2 cm incision was made in the anterior axillary line at the 6th ICS and a third small incision in the posterior axillary line at the 5th ICS. Grasping instrument were placed through the other incisions to hold the lung and the apical blebs were stapled using a 45 mm non-cutting stapler. There were  several large blebs along the medial aspect of the right upper lobe and these were stapled in a similar manner. The superior segment of the lower lobe had no blebs. There was also several moderate-sized blebs along  the medial basal edge of the lower lobe and these were removed with a 60 mm cutting stapler and sent to pathology. Then the left pleurodesis was performed using a small piece of abrasive pad to remove the parietal pleura over the upper 2/3 of the pleural space. Hemostasis was complete. A 28 F chest tube was inserted through the mid-axillary incision and the other incisions were closed in layers. The chest tube was connected to Pleur-evac suction. The sponge needle and instrument counts were correct according to the scrub nurse. The patient was then turned into the supine position, extubated, and transported to the post anesthesia care unit in satisfactory and stable condition.

## 2013-04-26 NOTE — Preoperative (Signed)
Beta Blockers   Reason not to administer Beta Blockers:Not Applicable 

## 2013-04-26 NOTE — Anesthesia Postprocedure Evaluation (Signed)
Anesthesia Post Note  Patient: William [name]  Procedure(s) Performed: Procedure(s) (LRB): VIDEO ASSISTED THORACOSCOPY (Left) STAPLING OF BLEBS (Left) PLEURADESIS (Left)  Anesthesia type: general  Patient location: PACU  Post pain: Pain level controlled  Post assessment: Patient's Cardiovascular Status Stable  Last Vitals:  Filed Vitals:   04/26/13 1330  BP:   Pulse:   Temp: 36.8 C  Resp:     Post vital signs: Reviewed and stable  Level of consciousness: sedated  Complications: No apparent anesthesia complications

## 2013-04-26 NOTE — Brief Op Note (Signed)
04/24/2013 - 04/26/2013  11:15 AM  PATIENT:  William [name]  53 y.o. male  PRE-OPERATIVE DIAGNOSIS:  LEFT PTX POSITIVE HIV  POST-OPERATIVE DIAGNOSIS:  LEFT PTX  PROCEDURE:  Procedure(s) with comments: VIDEO ASSISTED THORACOSCOPY (Left) STAPLING OF BLEBS (Left) PLEURADESIS (Left) - Mechanical  SURGEON:  Surgeon(s) and Role:    * Gaye Pollack, MD - Primary  PHYSICIAN ASSISTANT: Erin Barrett, PA-C   ANESTHESIA:   general  EBL:  Total I/O In: 900 [I.V.:900] Out: 550 [Urine:500; Chest Tube:50]  BLOOD ADMINISTERED:none  DRAINS: 1 28 F  Chest Tube(s) in the left pleural space   LOCAL MEDICATIONS USED:  NONE  SPECIMEN:  Source of Specimen:  blebs left upper lobe  DISPOSITION OF SPECIMEN:  PATHOLOGY  COUNTS:  YES  TOURNIQUET:  * No tourniquets in log *  DICTATION: .Note written in EPIC  PLAN OF CARE: Admit to inpatient   PATIENT DISPOSITION:  PACU - hemodynamically stable.   Delay start of Pharmacological VTE agent (>24hrs) due to surgical blood loss or risk of bleeding: yes

## 2013-04-26 NOTE — Anesthesia Preprocedure Evaluation (Addendum)
Anesthesia Evaluation  Patient identified by MRN, date of birth, ID band Patient awake    Reviewed: Allergy & Precautions, H&P , NPO status , Patient's Chart, lab work & pertinent test results  Airway Mallampati: II      Dental  (+) Poor Dentition, Missing, Chipped   Pulmonary Current Smoker,    Pulmonary exam normal       Cardiovascular     Neuro/Psych    GI/Hepatic (+) Hepatitis -, C  Endo/Other    Renal/GU      Musculoskeletal   Abdominal   Peds  Hematology   Anesthesia Other Findings   Reproductive/Obstetrics                          Anesthesia Physical Anesthesia Plan  ASA: III  Anesthesia Plan: General   Post-op Pain Management:    Induction: Intravenous  Airway Management Planned: Double Lumen EBT  Additional Equipment: Arterial line and CVP  Intra-op Plan:   Post-operative Plan: Extubation in OR  Informed Consent: I have reviewed the patients History and Physical, chart, labs and discussed the procedure including the risks, benefits and alternatives for the proposed anesthesia with the patient or authorized representative who has indicated his/her understanding and acceptance.   Dental advisory given  Plan Discussed with: Anesthesiologist and CRNA  Anesthesia Plan Comments:         Anesthesia Quick Evaluation

## 2013-04-26 NOTE — Progress Notes (Signed)
Patient had 9ml of serous output in chest tube overnight. Dressing changed, clean dry and intact.  Patient had CT of chest with contrast. Called Dr. Servando Snare to see if he needed additional 2View of Chest, previous one performed on 3/18 and he said he did not. Will continue to monitor.

## 2013-04-26 NOTE — Anesthesia Procedure Notes (Addendum)
Procedure Name: Intubation Date/Time: 04/26/2013 9:25 AM Performed by: Kyung Rudd Pre-anesthesia Checklist: Patient identified, Emergency Drugs available, Suction available, Patient being monitored and Timeout performed Patient Re-evaluated:Patient Re-evaluated prior to inductionOxygen Delivery Method: Circle system utilized Preoxygenation: Pre-oxygenation with 100% oxygen Intubation Type: IV induction Ventilation: Mask ventilation without difficulty Laryngoscope Size: Mac and 4 Grade View: Grade I Endobronchial tube: Left and Double lumen EBT and 39 Fr Number of attempts: 1 Airway Equipment and Method: Stylet Placement Confirmation: positive ETCO2,  ETT inserted through vocal cords under direct vision and breath sounds checked- equal and bilateral Tube secured with: Tape Dental Injury: Teeth and Oropharynx as per pre-operative assessment     The patient was identified and consent obtained.  TO was performed, and full barrier precautions were used.  The skin was anesthetized with lidocaine.  Once the vein was located with the 22 ga., the wire was inserted into the vein. Airs was aspirated during attempt, but the patient has a chest tube in place.  The insertion site was dilated and the CVL was carefully inserted and sutured in place. The patient tolerated the procedure well.  CXR was ordered for PACU Start: 0842 End: [place] J. Tedra Sen[place], MD

## 2013-04-26 NOTE — Transfer of Care (Signed)
Immediate Anesthesia Transfer of Care Note  Patient: William [name]  Procedure(s) Performed: Procedure(s) with comments: VIDEO ASSISTED THORACOSCOPY (Left) STAPLING OF BLEBS (Left) PLEURADESIS (Left) - Mechanical  Patient Location: PACU  Anesthesia Type:General  Level of Consciousness: sedated  Airway & Oxygen Therapy: Patient Spontanous Breathing and Patient connected to face mask oxygen  Post-op Assessment: Report given to PACU RN, Post -op Vital signs reviewed and stable and Patient moving all extremities X 4  Post vital signs: Reviewed and stable  Complications: No apparent anesthesia complications

## 2013-04-27 ENCOUNTER — Inpatient Hospital Stay (HOSPITAL_COMMUNITY): Payer: Medicare Other

## 2013-04-27 LAB — CBC
HCT: 33.7 % — ABNORMAL LOW (ref 39.0–52.0)
Hemoglobin: 11.7 g/dL — ABNORMAL LOW (ref 13.0–17.0)
MCH: 29.5 pg (ref 26.0–34.0)
MCHC: 34.7 g/dL (ref 30.0–36.0)
MCV: 84.9 fL (ref 78.0–100.0)
Platelets: 179 10*3/uL (ref 150–400)
RBC: 3.97 MIL/uL — ABNORMAL LOW (ref 4.22–5.81)
RDW: 13.3 % (ref 11.5–15.5)
WBC: 5.7 10*3/uL (ref 4.0–10.5)

## 2013-04-27 LAB — BASIC METABOLIC PANEL
BUN: 8 mg/dL (ref 6–23)
CO2: 22 mEq/L (ref 19–32)
Calcium: 8.7 mg/dL (ref 8.4–10.5)
Chloride: 103 mEq/L (ref 96–112)
Creatinine, Ser: 0.77 mg/dL (ref 0.50–1.35)
GFR calc Af Amer: >90 mL/min (ref >90)
GFR calc non Af Amer: >90 mL/min (ref >90)
Glucose, Bld: 103 mg/dL — ABNORMAL HIGH (ref 70–99)
Potassium: 4.4 mEq/L (ref 3.7–5.3)
Sodium: 137 mEq/L (ref 137–147)

## 2013-04-27 NOTE — Progress Notes (Addendum)
      [address]       [place] [address]             [phone]      1 Day Post-Op Procedure(s) (LRB): VIDEO ASSISTED THORACOSCOPY (Left) STAPLING OF BLEBS (Left) PLEURADESIS (Left)  Subjective:  Mr. Bushey has some minor incisional discomfort this morning.  He states for the most part his pain is well controlled  Objective: Vital signs in last 24 hours: Temp:  [97.9 F (36.6 C)-99 F (37.2 C)] 98.5 F (36.9 C) (03/21 0447) Pulse Rate:  [25-118] 67 (03/21 0447) Cardiac Rhythm:  [-] Normal sinus rhythm (03/21 0742) Resp:  [10-29] 12 (03/21 0741) BP: (114-139)/(66-110) 121/76 mmHg (03/21 0739) SpO2:  [67 %-100 %] 94 % (03/21 0447) Arterial Line BP: (129-166)/(63-90) 138/89 mmHg (03/20 2022) FiO2 (%):  [98 %] 98 % (03/21 0741) Weight:  [168 lb 10.4 oz (76.5 kg)] 168 lb 10.4 oz (76.5 kg) (03/20 1422)  Intake/Output from previous day: 03/20 0701 - 03/21 0700 In: 2485 [P.O.:360; I.V.:2075; IV Piggyback:50] Out: 4332 [Urine:3075; Blood:150; Chest Tube:670] Intake/Output this shift: Total I/O In: 75 [I.V.:75] Out: -   General appearance: alert, cooperative and no distress Heart: regular rate and rhythm Lungs: clear to auscultation bilaterally Abdomen: soft, non-tender; bowel sounds normal; no masses,  no organomegaly Wound: clean and dry  Lab Results:  Recent Labs  04/25/13 2302 04/27/13 0631  WBC 6.0 5.7  HGB 11.9* 11.7*  HCT 34.3* 33.7*  PLT 214 179   BMET:  Recent Labs  04/25/13 2302 04/27/13 0631  NA 135* 137  K 3.9 4.4  CL 99 103  CO2 21 22  GLUCOSE 149* 103*  BUN 10 8  CREATININE 0.85 0.77  CALCIUM 8.9 8.7    PT/INR:  Recent Labs  04/25/13 2302  LABPROT 13.6  INR 1.06   ABG    Component Value Date/Time   PHART 7.407 04/25/2013 1823   HCO3 21.5 04/25/2013 1823   TCO2 22.6 04/25/2013 1823   ACIDBASEDEF 2.4* 04/25/2013 1823   O2SAT 91.3 04/25/2013 1823   CBG (last 3)  No results found for this basename: GLUCAP,  in  the last 72 hours  Assessment/Plan: S/P Procedure(s) (LRB): VIDEO ASSISTED THORACOSCOPY (Left) STAPLING OF BLEBS (Left) PLEURADESIS (Left)  1. Chest tube- 670 cc output since surgery- CXR with bilateral atelectasis no pneumothorax, no air leak present, will place chest tube to water seal 2. D/C Arterial LIne 3. D/C Foley 4. Decrease IV Fluids 5. HIV- continue Anti-virals 6. COPD- continue inhalers  LOS: 3 days    BARRETT, ERIN 04/27/2013  I have seen and examined the patient and agree with the assessment and plan as outlined.  OWEN,CLARENCE H 04/27/2013 11:52 AM

## 2013-04-28 ENCOUNTER — Inpatient Hospital Stay (HOSPITAL_COMMUNITY): Payer: Medicare Other

## 2013-04-28 NOTE — Progress Notes (Addendum)
      [address]       [place] [address]             [phone]      2 Days Post-Op Procedure(s) (LRB): VIDEO ASSISTED THORACOSCOPY (Left) STAPLING OF BLEBS (Left) PLEURADESIS (Left)  Subjective:  William [name] had a rough night.  He states that he had a lot of pain overnight, especially with ambulating to get to the bathroom.  His nurse states patient got short of breath and would get tachycardia with exertion.    Objective: Vital signs in last 24 hours: Temp:  [97.4 F (36.3 C)-99.5 F (37.5 C)] 97.9 F (36.6 C) (03/22 0727) Pulse Rate:  [88-116] 93 (03/22 0727) Cardiac Rhythm:  [-] Normal sinus rhythm (03/22 0815) Resp:  [10-22] 17 (03/22 0727) BP: (129-136)/(73-101) 135/92 mmHg (03/22 0727) SpO2:  [92 %-98 %] 95 % (03/22 0727)  Intake/Output from previous day: 03/21 0701 - 03/22 0700 In: 1475 [I.V.:975] Out: 1215 [Urine:975; Chest Tube:240] Intake/Output this shift: Total I/O In: 50 [I.V.:50] Out: 30 [Chest Tube:30]  General appearance: alert, cooperative and no distress Heart: regular rate and rhythm Lungs: clear to auscultation bilaterally Abdomen: soft, non-tender; bowel sounds normal; no masses,  no organomegaly Extremities: extremities normal, atraumatic, no cyanosis or edema Wound: clean, dressing is soaked with blood tinged fluid  Lab Results:  Recent Labs  04/25/13 2302 04/27/13 0631  WBC 6.0 5.7  HGB 11.9* 11.7*  HCT 34.3* 33.7*  PLT 214 179   BMET:  Recent Labs  04/25/13 2302 04/27/13 0631  NA 135* 137  K 3.9 4.4  CL 99 103  CO2 21 22  GLUCOSE 149* 103*  BUN 10 8  CREATININE 0.85 0.77  CALCIUM 8.9 8.7    PT/INR:  Recent Labs  04/25/13 2302  LABPROT 13.6  INR 1.06   ABG    Component Value Date/Time   PHART 7.407 04/25/2013 1823   HCO3 21.5 04/25/2013 1823   TCO2 22.6 04/25/2013 1823   ACIDBASEDEF 2.4* 04/25/2013 1823   O2SAT 91.3 04/25/2013 1823   CBG (last 3)  No results found for this basename:  GLUCAP,  in the last 72 hours  Assessment/Plan: S/P Procedure(s) (LRB): VIDEO ASSISTED THORACOSCOPY (Left) STAPLING OF BLEBS (Left) PLEURADESIS (Left)  1. CV- patient with tachycardia overnight, mild hypertension- will follow may need to add low dose Beta Blocker 2. Pulm- wean oxygen as tolerated, 240 cc output chest tube yesterday, he is saturating several dressings per day per nursing staff, ? Sub- q air along left side, no air leak appreciated on chest tube 3. Place IV fluids to KVO 4. HIV- continue anti-virals 5. COPD- on home inhalers 6. Dispo- leave chest tube to water seal, repeat CXR in AM, monitor output  LOS: 4 days    BARRETT, ERIN 04/28/2013  I have seen and examined the patient and agree with the assessment and plan as outlined.  Keep chest tube in place for now until drainage decreases.  OWEN,CLARENCE H 04/28/2013 10:30 AM

## 2013-04-29 ENCOUNTER — Ambulatory Visit: Payer: Medicare Other | Admitting: Internal Medicine

## 2013-04-29 ENCOUNTER — Inpatient Hospital Stay (HOSPITAL_COMMUNITY): Payer: Medicare Other

## 2013-04-29 LAB — TYPE AND SCREEN
ABO/RH(D): A POS
Antibody Screen: NEGATIVE
Unit division: 0
Unit division: 0

## 2013-04-29 NOTE — Progress Notes (Signed)
Utilization review completed.  

## 2013-04-29 NOTE — Progress Notes (Addendum)
       [address]       [place] [address]             [phone]          3 Days Post-Op Procedure(s) (LRB): VIDEO ASSISTED THORACOSCOPY (Left) STAPLING OF BLEBS (Left) PLEURADESIS (Left)  Subjective: More comfortable today. Breathing stable. Walked earlier this am, states "pain comes and goes".   Objective: Vital signs in last 24 hours: Patient Vitals for the past 24 hrs:  BP Temp Temp src Pulse Resp SpO2  04/29/13 0400 - - - - 10 92 %  04/29/13 0332 127/94 mmHg 98 F (36.7 C) Oral 112 24 92 %  04/29/13 0007 - - - - 16 91 %  04/28/13 2353 143/95 mmHg 98.8 F (37.1 C) Oral 120 19 91 %  04/28/13 2017 - - - - 10 91 %  04/28/13 1952 133/94 mmHg 98.5 F (36.9 C) Oral 108 16 92 %  04/28/13 1625 128/86 mmHg 98.5 F (36.9 C) Oral - - 92 %  04/28/13 1140 144/99 mmHg 98.4 F (36.9 C) Oral - - -   Current Weight  04/26/13 168 lb 10.4 oz (76.5 kg)     Intake/Output from previous day: 03/22 0701 - 03/23 0700 In: 1050 [I.V.:1050] Out: 1180 [Urine:950; Chest Tube:230]    PHYSICAL EXAM:  Heart: RRR Lungs:Slightly decreased BS on L Wound: Dressed and dry Chest tube: No air leak    Lab Results: CBC: Recent Labs  04/27/13 0631  WBC 5.7  HGB 11.7*  HCT 33.7*  PLT 179   BMET:  Recent Labs  04/27/13 0631  NA 137  K 4.4  CL 103  CO2 22  GLUCOSE 103*  BUN 8  CREATININE 0.77  CALCIUM 8.7    PT/INR: No results found for this basename: LABPROT, INR,  in the last 72 hours  CXR: FINDINGS:  Stable cardiomegaly. Left subclavian catheter line is unchanged with  tip in expected position of the left innominate vein. Left-sided  chest tube is unchanged in position without evidence of  pneumothorax. Stable left basilar opacity consistent with  atelectasis. Stable minimal right basilar opacity is noted most  consistent with subsegmental atelectasis. Postoperative changes are  noted in the right upper lobe.  IMPRESSION:  Left-sided chest  tube is unchanged in position without evidence of  pneumothorax. Stable bilateral basilar lung opacities are noted with  left greater than right, consistent with subsegmental atelectasis.    Assessment/Plan: S/P Procedure(s) (LRB): VIDEO ASSISTED THORACOSCOPY (Left) STAPLING OF BLEBS (Left) PLEURADESIS (Left) CT output 230 ml/past 24 hours, no air leak. Continue CT to water seal for now. Ambulate, continue pulm toilet. Pain controlled at present- continue current meds.   LOS: 5 days    COLLINS,GINA H 04/29/2013   Chart reviewed, patient examined, agree with above. His CXR looks good. Will remove tube today and repeat CXR in the am. He should be able to go home tomorrow.

## 2013-04-29 NOTE — Progress Notes (Signed)
L pleural CT removed, pt tolerated well. Serous drainage oozing from site. Suture already pre-tied, unable to tie suture further upon removal but attempted extra knot x3. Occlusive dressing applied over site. Pt educated to notify if SOB or any s/s of respiratory difficulty.   William [name]

## 2013-04-29 NOTE — Discharge Summary (Signed)
[address]       [place] [address]             [phone]              Discharge Summary  Name: William [name] DOB: 03-16-1960 53 y.o. MRN: [id number]   Admission Date: 04/24/2013 Discharge Date: 04/30/2013    Admitting Diagnosis: Spontaneous left pneumothorax   Discharge Diagnosis:  Spontaneous left pneumothorax  Past Medical History  Diagnosis Date  . HIV (human immunodeficiency virus infection)   . Pneumothorax      Procedures: LEFT VIDEO ASSISTED THORACOSCOPY - 04/26/2013  STAPLING OF MULTIPLE BLEBS  MECHANICAL PLEURODESIS      HPI:  The patient is a 53 y.o. male with a history of previous right VATS with stapling of blebs for spontaneous pneumothorax by Dr. Cyndia Bent in 2010. CT scan performed at that time showed bilateral apical blebs. He was admitted at [facility] 3/11-13/2015 with a spontaneous left pneumothorax, which was treated conservatively without chest tube placement. Follow up x-rays showed resolution of the pneumothorax.  On 3/18, he developed acute onset left sided chest pain with associated shortness of breath, and presented to the ER at [facility] for further evaluation.  A chest x-ray revealed a 50% left pneumothorax. Because of his recent admission and his need for previous right VATS, [facility] was consulted for management.  Dr. Prescott Gum saw the patient and placed a 20 Fr left chest tube in the ER under local. The patient was subsequently admitted for further care.    Hospital Course:  The patient was admitted to [facility] on 04/24/2013. His initial chest x-ray showed re-expansion of the lung, however, when the chest tube was placed to water seal, he developed a recurrent pneumothorax and large air leak. A CT of the chest was performed and showed left apical blebs. It was felt that he would require a left VATS/bleb resection for definitive management.  Dr. Cyndia Bent had the next OR availability, and since he had previously  operated on the patient, he agreed to perform the surgery. All risks, benefits and alternatives of surgery were explained in detail, and the patient agreed to proceed. The patient was taken to the operating room and underwent the above procedure.    The postoperative course has generally been uneventful. His chest x-rays have remained stable, and chest tube was removed on 04/29/2013 without difficulty.  He has otherwise done well.  He has remained afebrile and vital signs are stable. Incisions are healing well. Oxygen has been weaned, and sats have remained greater than 90% on room air.  He is ambulating in the halls without difficulty and tolerating a regular diet.  He has not had a bowel movement. He will be given lactulose. Provided he has a bowel movement, he will be discharged later today.    Recent vital signs:  Filed Vitals:   04/29/13 1219  BP: 133/89  Pulse: 98  Temp:   Resp: 15    Recent laboratory studies:  CBC: Recent Labs  04/27/13 0631  WBC 5.7  HGB 11.7*  HCT 33.7*  PLT 179   BMET:  Recent Labs  04/27/13 0631  NA 137  K 4.4  CL 103  CO2 22  GLUCOSE 103*  BUN 8  CREATININE 0.77  CALCIUM 8.7    PT/INR: No results found for this basename: LABPROT, INR,  in the last 72 hours   Discharge Medications:  Medication List         albuterol 108 (90 BASE) MCG/ACT inhaler  Commonly known as:  PROVENTIL HFA;VENTOLIN HFA  Inhale 2 puffs into the lungs every 6 (six) hours as needed for wheezing or shortness of breath.     loratadine 10 MG tablet  Commonly known as:  CLARITIN  Take 1 tablet (10 mg total) by mouth daily.     megestrol 400 MG/10ML suspension  Commonly known as:  MEGACE  Take 20 mLs (800 mg total) by mouth daily.     oxyCODONE-acetaminophen 5-325 MG per tablet  Commonly known as:  PERCOCET/ROXICET  Take 1-2 tablets by mouth every 4 (four) hours as needed for severe pain.     STRIBILD 150-150-200-300 MG Tabs tablet  Generic drug:   elvitegravir-cobicistat-emtricitabine-tenofovir  Take 1 tablet by mouth daily with breakfast.     sulfamethoxazole-trimethoprim 800-160 MG per tablet  Commonly known as:  BACTRIM DS  Take 1 tablet by mouth daily.        Discharge Instructions:  The patient is to refrain from driving, heavy lifting or strenuous activity.  May shower daily and clean incisions with soap and water.  May resume regular diet.   Follow Up: Follow-up Information   Follow up with Gaye Pollack, MD On 05/22/2013. (Have a chest x-ray at [facility] (which is in the same building as Dr. Vivi Martens office) on 05/22/2013 at 12:00 pm;Appointment time to see Dr. Cyndia Bent is at 1:00 pm )    Specialty:  Cardiothoracic Surgery   Contact information:   [address] [place] [address] [phone]       Follow up with [facility] [place] NURSE On 05/07/2013. (For suture removal at 11:00 am)           COLLINS,GINA H 04/29/2013, 1:46 PM

## 2013-04-30 ENCOUNTER — Inpatient Hospital Stay (HOSPITAL_COMMUNITY): Payer: Medicare Other

## 2013-04-30 ENCOUNTER — Encounter (HOSPITAL_COMMUNITY): Payer: Self-pay | Admitting: Surgery

## 2013-04-30 MED ORDER — OXYCODONE-ACETAMINOPHEN 5-325 MG PO TABS: 1.0000 | ORAL_TABLET | ORAL | Status: DC | PRN

## 2013-04-30 MED ORDER — LACTULOSE 10 GM/15ML PO SOLN
30.0000 g | Freq: Once | ORAL | Status: AC
  Administered 2013-04-30: 30 g via ORAL
  Filled 2013-04-30: qty 45

## 2013-04-30 NOTE — Progress Notes (Signed)
Pt d/c home per MD order, pt tol well, pt VSS, d/c instructions given, all questions answered

## 2013-04-30 NOTE — Progress Notes (Addendum)
      [address]       [employer] [address]             [phone]       4 Days Post-Op Procedure(s) (LRB): VIDEO ASSISTED THORACOSCOPY (Left) STAPLING OF BLEBS (Left) PLEURADESIS (Left)  Subjective: Patient's only complaint is constipation  Objective: Vital signs in last 24 hours: Temp:  [98 F (36.7 C)-98.2 F (36.8 C)] 98.1 F (36.7 C) (03/24 0434) Pulse Rate:  [81-98] 85 (03/24 0434) Cardiac Rhythm:  [-] Normal sinus rhythm (03/23 1952) Resp:  [10-22] 17 (03/24 0434) BP: (126-133)/(81-89) 126/84 mmHg (03/24 0434) SpO2:  [92 %-97 %] 95 % (03/24 0434)     Intake/Output from previous day: 03/23 0701 - 03/24 0700 In: 3096.9 [P.O.:1780; I.V.:1316.9] Out: 3350 [Urine:3250; Chest Tube:100]   Physical Exam:  Cardiovascular: RRR Pulmonary: Clear to auscultation bilaterally; no rales, wheezes, or rhonchi. Abdomen: Semi soft, distended,non tender, bowel sounds present. Extremities: No lower extremity edema. Wounds: Clean and dry.  No erythema or signs of infection.   Lab Results: CBC:No results found for this basename: WBC, HGB, HCT, PLT,  in the last 72 hours BMET: No results found for this basename: NA, K, CL, CO2, GLUCOSE, BUN, CREATININE, CALCIUM,  in the last 72 hours  PT/INR: No results found for this basename: LABPROT, INR,  in the last 72 hours ABG:  INR: Will add last result for INR, ABG once components are confirmed Will add last 4 CBG results once components are confirmed  Assessment/Plan:  1. CV - SR 2.  Pulmonary - Chest tube removed yesterday. CXR this am appears to show no pneumothorax, stable cardiomegaly. 3. Constipation-no bowel movement in days. Give lactulose 4. Remove central line 5.Lactulose for constipation 6. Possible discharge later today  ZIMMERMAN,DONIELLE MPA-C 04/30/2013,8:36 AM  Chart reviewed, patient examined, agree with above. He had BM today and abdomen is benign. He can go home. I will remove the chest  tube suture in the office.

## 2013-05-07 ENCOUNTER — Encounter: Payer: Self-pay | Admitting: *Deleted

## 2013-05-07 ENCOUNTER — Ambulatory Visit (INDEPENDENT_AMBULATORY_CARE_PROVIDER_SITE_OTHER): Payer: Medicare Other

## 2013-05-07 ENCOUNTER — Other Ambulatory Visit: Payer: Medicare Other

## 2013-05-07 DIAGNOSIS — D381 Neoplasm of uncertain behavior of trachea, bronchus and lung: Secondary | ICD-10-CM

## 2013-05-07 DIAGNOSIS — B20 Human immunodeficiency virus [HIV] disease: Secondary | ICD-10-CM

## 2013-05-07 DIAGNOSIS — G8918 Other acute postprocedural pain: Secondary | ICD-10-CM

## 2013-05-07 MED ORDER — OXYCODONE-ACETAMINOPHEN 5-325 MG PO TABS: 1.0000 | ORAL_TABLET | Freq: Four times a day (QID) | ORAL | Status: DC | PRN

## 2013-05-07 NOTE — Progress Notes (Unsigned)
Patient ID: EAN GETTEL, male   DOB: Nov 22, 1960, 53 y.o.   MRN: [id number] Referral faxed to [facility] for Cardiac Rehab Phase II per Dr. Lucianne Lei Trigt's request.

## 2013-05-07 NOTE — Progress Notes (Signed)
Removed 1 suture from chest tube site. No signs of infection and pt tolerated well. RX refill for Percocet 5/325 mg was given by Dr Roxan Hockey.

## 2013-05-08 LAB — HIV-1 RNA QUANT-NO REFLEX-BLD
HIV 1 RNA Quant: <20 copies/mL (ref <20)
HIV-1 RNA Quant, Log: <1.3 {Log} (ref <1.30)

## 2013-05-08 LAB — T-HELPER CELL (CD4) - (RCID CLINIC ONLY)
CD4 % Helper T Cell: 12 % — ABNORMAL LOW (ref 33–55)
CD4 T Cell Abs: 360 /uL — ABNORMAL LOW (ref 400–2700)

## 2013-05-14 ENCOUNTER — Other Ambulatory Visit: Payer: Self-pay | Admitting: *Deleted

## 2013-05-14 DIAGNOSIS — J939 Pneumothorax, unspecified: Secondary | ICD-10-CM

## 2013-05-20 ENCOUNTER — Ambulatory Visit (INDEPENDENT_AMBULATORY_CARE_PROVIDER_SITE_OTHER): Payer: Medicare Other | Admitting: Internal Medicine

## 2013-05-20 ENCOUNTER — Encounter: Payer: Self-pay | Admitting: Internal Medicine

## 2013-05-20 VITALS — BP 138/89 | HR 74 | Temp 98.3°F | Ht 71.0 in | Wt 158.2 lb

## 2013-05-20 DIAGNOSIS — B20 Human immunodeficiency virus [HIV] disease: Secondary | ICD-10-CM

## 2013-05-20 DIAGNOSIS — Z79899 Other long term (current) drug therapy: Secondary | ICD-10-CM | POA: Diagnosis not present

## 2013-05-20 NOTE — Progress Notes (Signed)
Patient ID: William [name], male   DOB: 1961/01/02, 53 y.o.   MRN: [id number] @LOGODEPT         Patient Active Problem List   Diagnosis Date Noted  . HIV DISEASE 07/10/2006    Priority: High  . HEPATITIS C 07/10/2006    Priority: Medium  . Spontaneous pneumothorax 04/24/2013  . Pneumothorax, left 04/17/2013  . Seasonal allergies 01/22/2013  . Dental abscess 11/24/2011  . OTHER NONSPECIFIC ABNORMAL SERUM ENZYME LEVELS 02/16/2010  . Pneumothorax 10/16/2008  . SPONTANEOUS PNEUMOTHORAX 10/16/2008  . PNEUMOCYSTIS PNEUMONIA 07/10/2006  . LOW BACK PAIN 07/10/2006  . ALCOHOL ABUSE, HX OF 07/10/2006    Patient's Medications  New Prescriptions   No medications on file  Previous Medications   ALBUTEROL (PROVENTIL HFA;VENTOLIN HFA) 108 (90 BASE) MCG/ACT INHALER    Inhale 2 puffs into the lungs every 6 (six) hours as needed for wheezing or shortness of breath.   ELVITEGRAVIR-COBICISTAT-EMTRICITABINE-TENOFOVIR (STRIBILD) 150-150-200-300 MG TABS TABLET    Take 1 tablet by mouth daily with breakfast.   LORATADINE (CLARITIN) 10 MG TABLET    Take 1 tablet (10 mg total) by mouth daily.   MEGESTROL (MEGACE) 400 MG/10ML SUSPENSION    Take 20 mLs (800 mg total) by mouth daily.   OXYCODONE-ACETAMINOPHEN (PERCOCET/ROXICET) 5-325 MG PER TABLET    Take 1 tablet by mouth every 6 (six) hours as needed for severe pain.  Modified Medications   No medications on file  Discontinued Medications   SULFAMETHOXAZOLE-TRIMETHOPRIM (BACTRIM DS) 800-160 MG PER TABLET    Take 1 tablet by mouth daily.    Subjective: William [name] is in for his routine visit. He has not missed any doses of his Stribild since his last visit. He was recently hospitalized with a spontaneous left pneumothorax and required pleurodesis. Review of Systems: Pertinent items are noted in HPI.  Past Medical History  Diagnosis Date  . HIV (human immunodeficiency virus infection)   . Pneumothorax     History  Substance Use Topics  . Smoking  status: Former Smoker -- 0.40 packs/day for 15 years    Types: Cigarettes  . Smokeless tobacco: Never Used  . Alcohol Use: No    No family history on file.  No Known Allergies  Objective: Temp: 98.3 F (36.8 C) (04/13 0900) Temp src: Oral (04/13 0900) BP: 138/89 mmHg (04/13 0900) Pulse Rate: 74 (04/13 0900) Body mass index is 22.08 kg/(m^2).  General: He is in good spirits Oral: No oropharyngeal lesions Skin: No rash Lungs: Clear bilaterally. Left chest surgical incisions healing nicely Cor: Regular S1 and S2 with no murmurs  Lab Results Lab Results  Component Value Date   WBC 5.7 04/27/2013   HGB 11.7* 04/27/2013   HCT 33.7* 04/27/2013   MCV 84.9 04/27/2013   PLT 179 04/27/2013    Lab Results  Component Value Date   CREATININE 0.77 04/27/2013   BUN 8 04/27/2013   NA 137 04/27/2013   K 4.4 04/27/2013   CL 103 04/27/2013   CO2 22 04/27/2013    Lab Results  Component Value Date   ALT 64* 04/25/2013   AST 37 04/25/2013   ALKPHOS 67 04/25/2013   BILITOT 0.4 04/25/2013    Lab Results  Component Value Date   CHOL 165 03/05/2013   HDL 33* 03/05/2013   LDLCALC 121* 03/05/2013   TRIG 54 03/05/2013   CHOLHDL 5.0 03/05/2013    Lab Results HIV 1 RNA Quant (copies/mL)  Date Value  05/07/2013 <20   03/05/2013 21  01/22/2013 <20      CD4 T Cell Abs (/uL)  Date Value  05/07/2013 360*  03/05/2013 170*  01/22/2013 120*     Assessment: William [name]'s HIV infection is doing very well. His viral load is undetectable and he's had excellent CD4 reconstitution over the past 2-1/2 years. His wife continues to oversee his medication and his adherence has been excellent. He can stop and pneumocystis prophylaxis now.  Plan: 1. Continue Stribild 2. Discontinue trimethoprim-sulfamethoxazole 3. Follow up after lab work in [place] months   Michel Bickers, MD [facility] for William [name] [phone] pager   [phone] cell 05/20/2013, 9:21 AM

## 2013-05-22 ENCOUNTER — Ambulatory Visit
Admission: RE | Admit: 2013-05-22 | Discharge: 2013-05-22 | Disposition: A | Discharge status: Home / Self Care | Payer: Medicare Other | Source: Ambulatory Visit | Attending: Surgery | Admitting: Surgery

## 2013-05-22 ENCOUNTER — Encounter: Payer: Self-pay | Admitting: Surgery

## 2013-05-22 ENCOUNTER — Ambulatory Visit (INDEPENDENT_AMBULATORY_CARE_PROVIDER_SITE_OTHER): Payer: Medicare Other | Admitting: Surgery

## 2013-05-22 VITALS — BP 120/78 | HR 82 | Resp 20 | Ht 71.0 in | Wt 158.0 lb

## 2013-05-22 DIAGNOSIS — Z09 Encounter for follow-up examination after completed treatment for conditions other than malignant neoplasm: Secondary | ICD-10-CM

## 2013-05-22 DIAGNOSIS — J939 Pneumothorax, unspecified: Secondary | ICD-10-CM

## 2013-05-22 DIAGNOSIS — Z9889 Other specified postprocedural states: Secondary | ICD-10-CM

## 2013-05-22 DIAGNOSIS — J9383 Other pneumothorax: Secondary | ICD-10-CM

## 2013-05-22 DIAGNOSIS — G8918 Other acute postprocedural pain: Secondary | ICD-10-CM

## 2013-05-22 MED ORDER — OXYCODONE-ACETAMINOPHEN 5-325 MG PO TABS: 1.0000 | ORAL_TABLET | Freq: Four times a day (QID) | ORAL | Status: DC | PRN

## 2013-05-22 NOTE — Progress Notes (Signed)
     HPI:  Patient returns for routine postoperative follow-up having undergone left VATS for stapling of multiple blebs and mechanical pleurodesis on 04/26/2013. The patient's early postoperative recovery while in the hospital was notable for an uncomplicated postop course. Since hospital discharge the patient reports that he is feeling fairly well. He still has some incisional pain. He is trying not to smoke but can't afford nicotine patches and wants to try wellbutrin.   Current Outpatient Prescriptions  Medication Sig Dispense Refill  . albuterol (PROVENTIL HFA;VENTOLIN HFA) 108 (90 BASE) MCG/ACT inhaler Inhale 2 puffs into the lungs every 6 (six) hours as needed for wheezing or shortness of breath.      . elvitegravir-cobicistat-emtricitabine-tenofovir (STRIBILD) 150-150-200-300 MG TABS tablet Take 1 tablet by mouth daily with breakfast.      . loratadine (CLARITIN) 10 MG tablet Take 1 tablet (10 mg total) by mouth daily.  30 tablet  6  . megestrol (MEGACE) 400 MG/10ML suspension Take 20 mLs (800 mg total) by mouth daily.  240 mL  5  . oxyCODONE-acetaminophen (PERCOCET/ROXICET) 5-325 MG per tablet Take 1 tablet by mouth every 6 (six) hours as needed for severe pain.  40 tablet  0   No current facility-administered medications for this visit.    Physical Exam: BP 120/78  Pulse 82  Resp 20  Ht 5\' 11"  (1.803 m)  Wt 158 lb (71.668 kg)  BMI 22.05 kg/m2  SpO2 98% He looks well. Lung exam is clear. Cardiac exam shows a regular rate and rhythm with normal heart sounds. Chest incisions are healing well.    Diagnostic Tests:  CLINICAL DATA: History of pneumothorax. Status post VATS on  04/26/2013  EXAM:  CHEST 2 VIEW  COMPARISON: DG CHEST 2 VIEW dated 04/30/2013; DG CHEST 2 VIEW dated  04/17/2013; DG CHEST 2 VIEW dated 02/10/2009; CT CHEST W/CM dated  04/25/2013; DG CHEST 1V PORT dated 04/25/2013; DG CHEST 1V PORT dated  04/29/2013  FINDINGS:  All aeration at the lung bases has  improved in the interval with  right lung base now appearing clear and only minimal residual  disease seen in the left base on the current exam. No evidence for  pleural effusion. Bullous change with pleural parenchymal scarring  is noted in the right apex. Suture material is noted in the right  apex, lateral right mid lung and left base.  Small nodular density projects in the right mid lung on today's  study which was not definitely seen previously and may represent a  vessel on end.  Left subclavian central line is been removed in the interval  IMPRESSION:  Interval improvement basilar aeration in both lung bases.  Tiny nodular density seen in the right mid lung on today's study was  not present previously. This may represent a vessel on end.  Attention to this region on follow-up films is suggested.  Electronically Signed  By: Misty Stanley M.D.  On: 05/22/2013 13:32    Impression:  Overall I think he is doing well. I encouraged him to continue walking and to abstain from smoking. He will discuss Wellbutrin with his primary physician. I told him he could return to driving and return to normal activity as tolerated.  Plan:  He will continue to follow up with his primary physician and will return to see me if he develops any problems with his incisions.

## 2013-06-13 ENCOUNTER — Other Ambulatory Visit: Payer: Self-pay | Admitting: *Deleted

## 2013-06-13 DIAGNOSIS — G8918 Other acute postprocedural pain: Secondary | ICD-10-CM

## 2013-06-13 MED ORDER — HYDROCODONE-ACETAMINOPHEN 7.5-325 MG PO TABS: 1.0000 | ORAL_TABLET | Freq: Four times a day (QID) | ORAL | Status: DC | PRN

## 2013-07-16 ENCOUNTER — Other Ambulatory Visit: Payer: Self-pay | Admitting: *Deleted

## 2013-07-16 DIAGNOSIS — G8918 Other acute postprocedural pain: Secondary | ICD-10-CM

## 2013-07-16 MED ORDER — TRAMADOL HCL 50 MG PO TABS: 50.0000 mg | ORAL_TABLET | Freq: Four times a day (QID) | ORAL | Status: DC | PRN

## 2013-07-23 ENCOUNTER — Emergency Department (HOSPITAL_COMMUNITY): Payer: No Typology Code available for payment source

## 2013-07-23 ENCOUNTER — Emergency Department (HOSPITAL_COMMUNITY)
Admission: EM | Admit: 2013-07-23 | Discharge: 2013-07-23 | Disposition: A | Discharge status: Home / Self Care | Payer: No Typology Code available for payment source | Attending: Emergency Medicine | Admitting: Emergency Medicine

## 2013-07-23 ENCOUNTER — Encounter (HOSPITAL_COMMUNITY): Payer: Self-pay | Admitting: Emergency Medicine

## 2013-07-23 DIAGNOSIS — S0993XA Unspecified injury of face, initial encounter: Secondary | ICD-10-CM | POA: Insufficient documentation

## 2013-07-23 DIAGNOSIS — S298XXA Other specified injuries of thorax, initial encounter: Secondary | ICD-10-CM | POA: Diagnosis not present

## 2013-07-23 DIAGNOSIS — Z87891 Personal history of nicotine dependence: Secondary | ICD-10-CM | POA: Insufficient documentation

## 2013-07-23 DIAGNOSIS — Y9389 Activity, other specified: Secondary | ICD-10-CM | POA: Diagnosis not present

## 2013-07-23 DIAGNOSIS — S39012A Strain of muscle, fascia and tendon of lower back, initial encounter: Secondary | ICD-10-CM

## 2013-07-23 DIAGNOSIS — S335XXA Sprain of ligaments of lumbar spine, initial encounter: Secondary | ICD-10-CM | POA: Diagnosis not present

## 2013-07-23 DIAGNOSIS — Z21 Asymptomatic human immunodeficiency virus [HIV] infection status: Secondary | ICD-10-CM | POA: Insufficient documentation

## 2013-07-23 DIAGNOSIS — Z79899 Other long term (current) drug therapy: Secondary | ICD-10-CM | POA: Diagnosis not present

## 2013-07-23 DIAGNOSIS — R51 Headache: Secondary | ICD-10-CM | POA: Insufficient documentation

## 2013-07-23 DIAGNOSIS — S199XXA Unspecified injury of neck, initial encounter: Secondary | ICD-10-CM

## 2013-07-23 DIAGNOSIS — Y92410 Unspecified street and highway as the place of occurrence of the external cause: Secondary | ICD-10-CM | POA: Insufficient documentation

## 2013-07-23 DIAGNOSIS — Z8709 Personal history of other diseases of the respiratory system: Secondary | ICD-10-CM | POA: Insufficient documentation

## 2013-07-23 DIAGNOSIS — IMO0002 Reserved for concepts with insufficient information to code with codable children: Secondary | ICD-10-CM | POA: Diagnosis present

## 2013-07-23 MED ORDER — HYDROCODONE-ACETAMINOPHEN 5-325 MG PO TABS
1.0000 | ORAL_TABLET | Freq: Once | ORAL | Status: AC
  Administered 2013-07-23: 1 via ORAL

## 2013-07-23 MED ORDER — HYDROCODONE-ACETAMINOPHEN 5-325 MG PO TABS
ORAL_TABLET | ORAL | Status: AC
  Filled 2013-07-23: qty 1

## 2013-07-23 MED ORDER — TRAMADOL HCL 50 MG PO TABS: 50.0000 mg | ORAL_TABLET | Freq: Four times a day (QID) | ORAL | Status: DC | PRN

## 2013-07-23 NOTE — Discharge Instructions (Signed)
Follow up with your md as needed °
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
## 2013-07-23 NOTE — ED Notes (Signed)
Pt requesting something for pain.  Notified edp

## 2013-08-27 ENCOUNTER — Other Ambulatory Visit: Payer: Self-pay | Admitting: Internal Medicine

## 2013-08-27 DIAGNOSIS — B20 Human immunodeficiency virus [HIV] disease: Secondary | ICD-10-CM

## 2013-11-05 ENCOUNTER — Other Ambulatory Visit: Payer: Medicare Other

## 2013-11-19 ENCOUNTER — Ambulatory Visit: Payer: Medicare Other | Admitting: Internal Medicine

## 2013-11-21 ENCOUNTER — Telehealth: Payer: Self-pay | Admitting: *Deleted

## 2013-11-21 ENCOUNTER — Ambulatory Visit: Payer: Medicare Other | Admitting: Internal Medicine

## 2013-11-21 NOTE — Telephone Encounter (Signed)
Unable to reach the pt.  Phone number incorrect.

## 2013-11-22 ENCOUNTER — Other Ambulatory Visit: Payer: Self-pay

## 2014-01-06 ENCOUNTER — Other Ambulatory Visit: Payer: Medicare Other

## 2014-01-13 ENCOUNTER — Other Ambulatory Visit: Payer: Medicare Other

## 2014-01-13 DIAGNOSIS — B20 Human immunodeficiency virus [HIV] disease: Secondary | ICD-10-CM

## 2014-01-13 DIAGNOSIS — Z79899 Other long term (current) drug therapy: Secondary | ICD-10-CM

## 2014-01-13 LAB — COMPREHENSIVE METABOLIC PANEL
ALT: 33 U/L (ref 0–53)
AST: 30 U/L (ref 0–37)
Albumin: 4.1 g/dL (ref 3.5–5.2)
Alkaline Phosphatase: 75 U/L (ref 39–117)
BUN: 12 mg/dL (ref 6–23)
CO2: 26 mEq/L (ref 19–32)
Calcium: 8.9 mg/dL (ref 8.4–10.5)
Chloride: 106 mEq/L (ref 96–112)
Creat: 0.86 mg/dL (ref 0.50–1.35)
Glucose, Bld: 93 mg/dL (ref 70–99)
Potassium: 4.4 mEq/L (ref 3.5–5.3)
Sodium: 138 mEq/L (ref 135–145)
Total Bilirubin: 0.4 mg/dL (ref 0.2–1.2)
Total Protein: 7.2 g/dL (ref 6.0–8.3)

## 2014-01-13 LAB — LIPID PANEL
Cholesterol: 152 mg/dL (ref 0–200)
HDL: 39 mg/dL — ABNORMAL LOW (ref >39)
LDL Cholesterol: 102 mg/dL — ABNORMAL HIGH (ref 0–99)
Total CHOL/HDL Ratio: 3.9 Ratio
Triglycerides: 55 mg/dL (ref <150)
VLDL: 11 mg/dL (ref 0–40)

## 2014-01-13 LAB — RPR

## 2014-01-14 LAB — T-HELPER CELL (CD4) - (RCID CLINIC ONLY)
CD4 % Helper T Cell: 15 % — ABNORMAL LOW (ref 33–55)
CD4 T Cell Abs: 260 /uL — ABNORMAL LOW (ref 400–2700)

## 2014-01-14 LAB — HIV-1 RNA QUANT-NO REFLEX-BLD
HIV 1 RNA Quant: <20 copies/mL (ref <20)
HIV-1 RNA Quant, Log: <1.3 {Log} (ref <1.30)

## 2014-02-11 ENCOUNTER — Telehealth: Payer: Self-pay | Admitting: *Deleted

## 2014-02-11 ENCOUNTER — Ambulatory Visit: Payer: Medicare Other | Admitting: Internal Medicine

## 2014-02-11 NOTE — Telephone Encounter (Signed)
Needing to make a new MD appt w/ Dr. Megan Salon.

## 2014-03-02 ENCOUNTER — Encounter (HOSPITAL_COMMUNITY): Payer: Self-pay

## 2014-03-02 ENCOUNTER — Emergency Department (HOSPITAL_COMMUNITY)
Admission: EM | Admit: 2014-03-02 | Discharge: 2014-03-02 | Disposition: A | Discharge status: Home / Self Care | Payer: Medicare Other | Attending: Emergency Medicine | Admitting: Emergency Medicine

## 2014-03-02 DIAGNOSIS — Z760 Encounter for issue of repeat prescription: Secondary | ICD-10-CM

## 2014-03-02 DIAGNOSIS — Z79899 Other long term (current) drug therapy: Secondary | ICD-10-CM | POA: Diagnosis not present

## 2014-03-02 DIAGNOSIS — Z21 Asymptomatic human immunodeficiency virus [HIV] infection status: Secondary | ICD-10-CM | POA: Insufficient documentation

## 2014-03-02 DIAGNOSIS — Z590 Homelessness: Secondary | ICD-10-CM | POA: Diagnosis not present

## 2014-03-02 DIAGNOSIS — K047 Periapical abscess without sinus: Secondary | ICD-10-CM | POA: Diagnosis not present

## 2014-03-02 DIAGNOSIS — G8918 Other acute postprocedural pain: Secondary | ICD-10-CM

## 2014-03-02 DIAGNOSIS — R110 Nausea: Secondary | ICD-10-CM | POA: Diagnosis not present

## 2014-03-02 DIAGNOSIS — Z87891 Personal history of nicotine dependence: Secondary | ICD-10-CM | POA: Insufficient documentation

## 2014-03-02 DIAGNOSIS — R51 Headache: Secondary | ICD-10-CM | POA: Diagnosis not present

## 2014-03-02 DIAGNOSIS — B20 Human immunodeficiency virus [HIV] disease: Secondary | ICD-10-CM

## 2014-03-02 DIAGNOSIS — K029 Dental caries, unspecified: Secondary | ICD-10-CM | POA: Diagnosis not present

## 2014-03-02 DIAGNOSIS — K088 Other specified disorders of teeth and supporting structures: Secondary | ICD-10-CM | POA: Diagnosis not present

## 2014-03-02 DIAGNOSIS — Z8709 Personal history of other diseases of the respiratory system: Secondary | ICD-10-CM | POA: Insufficient documentation

## 2014-03-02 MED ORDER — ELVITEG-COBIC-EMTRICIT-TENOFDF 150-150-200-300 MG PO TABS: 1.0000 | ORAL_TABLET | Freq: Every day | ORAL | Status: DC

## 2014-03-02 MED ORDER — IBUPROFEN 200 MG PO TABS: 600.0000 mg | ORAL_TABLET | Freq: Once | ORAL | Status: DC

## 2014-03-02 MED ORDER — CLINDAMYCIN HCL 300 MG PO CAPS
300.0000 mg | ORAL_CAPSULE | Freq: Once | ORAL | Status: AC
  Administered 2014-03-02: 300 mg via ORAL
  Filled 2014-03-02: qty 1

## 2014-03-02 MED ORDER — CLINDAMYCIN HCL 150 MG PO CAPS: 150.0000 mg | ORAL_CAPSULE | Freq: Four times a day (QID) | ORAL | Status: DC

## 2014-03-02 MED ORDER — TRAMADOL HCL 50 MG PO TABS
50.0000 mg | ORAL_TABLET | Freq: Once | ORAL | Status: AC
  Administered 2014-03-02: 50 mg via ORAL
  Filled 2014-03-02: qty 1

## 2014-03-02 MED ORDER — TRAMADOL HCL 50 MG PO TABS: 50.0000 mg | ORAL_TABLET | Freq: Four times a day (QID) | ORAL | Status: DC | PRN

## 2014-03-02 NOTE — ED Notes (Signed)
Bed: BT66 Expected date:  Expected time:  Means of arrival:  Comments: toothache
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
## 2014-03-11 ENCOUNTER — Encounter: Payer: Self-pay | Admitting: Internal Medicine

## 2014-03-11 ENCOUNTER — Ambulatory Visit (INDEPENDENT_AMBULATORY_CARE_PROVIDER_SITE_OTHER): Payer: Medicare Other | Admitting: Internal Medicine

## 2014-03-11 VITALS — BP 122/82 | HR 77 | Temp 97.6°F | Wt 155.8 lb

## 2014-03-11 DIAGNOSIS — Z23 Encounter for immunization: Secondary | ICD-10-CM | POA: Diagnosis not present

## 2014-03-11 DIAGNOSIS — B182 Chronic viral hepatitis C: Secondary | ICD-10-CM

## 2014-03-11 DIAGNOSIS — B20 Human immunodeficiency virus [HIV] disease: Secondary | ICD-10-CM

## 2014-03-11 MED ORDER — ELVITEG-COBIC-EMTRICIT-TENOFDF 150-150-200-300 MG PO TABS: 1.0000 | ORAL_TABLET | Freq: Every day | ORAL | Status: DC

## 2014-03-11 MED ORDER — ELVITEG-COBIC-EMTRICIT-TENOFAF 150-150-200-10 MG PO TABS: 1.0000 | ORAL_TABLET | Freq: Every day | ORAL | Status: DC

## 2014-03-11 NOTE — Patient Instructions (Signed)
Try drinking instant breakfast.

## 2014-03-11 NOTE — Progress Notes (Signed)
Patient ID: William [name], male   DOB: [birth year], 54 y.o.   MRN: [id number]          Patient Active Problem List   Diagnosis Date Noted  . Human immunodeficiency virus (HIV) disease 07/10/2006    Priority: High  . Chronic hepatitis C without hepatic coma 07/10/2006    Priority: Medium  . Spontaneous pneumothorax 04/24/2013  . Seasonal allergies 01/22/2013  . Dental abscess 11/24/2011  . OTHER NONSPECIFIC ABNORMAL SERUM ENZYME LEVELS 02/16/2010  . SPONTANEOUS PNEUMOTHORAX 10/16/2008  . PNEUMOCYSTIS PNEUMONIA 07/10/2006  . LOW BACK PAIN 07/10/2006  . ALCOHOL ABUSE, HX OF 07/10/2006    Patient's Medications  New Prescriptions   ELVITEGRAVIR-COBICISTAT-EMTRICITABINE-TENOFOVIR ([name]) 150-150-200-10 MG TABS TABLET    Take 1 tablet by mouth daily with breakfast.  Previous Medications   CLINDAMYCIN (CLEOCIN) 150 MG CAPSULE    Take 1 capsule (150 mg total) by mouth every 6 (six) hours.   LORATADINE (CLARITIN) 10 MG TABLET    Take 1 tablet (10 mg total) by mouth daily.   TRAMADOL (ULTRAM) 50 MG TABLET    Take 1 tablet (50 mg total) by mouth every 6 (six) hours as needed.  Modified Medications   No medications on file  Discontinued Medications   ELVITEGRAVIR-COBICISTAT-EMTRICITABINE-TENOFOVIR (STRIBILD) 150-150-200-300 MG TABS TABLET    Take 1 tablet by mouth daily with breakfast.   TRAMADOL (ULTRAM) 50 MG TABLET    Take 1 tablet (50 mg total) by mouth every 6 (six) hours as needed (may take one or two tablets every six hrs prn).    Subjective: [name] is in for his first visit since April 2015. He has split again with his wife and recently moved back to [place]. He is currently staying at the [employer]. He is working with his case Freight forwarder to try to find more stable housing. When he moved he had to change pharmacies causing him to be out of Stribild for 1 week. He refilled it and started back on it on 03/03/2014. Other than that he denies missing other doses.  He has had  continued problems with dental pain. He is seeing Dr. Diona Browner who plans a full mouth extraction. He recently started on oral clindamycin and tramadol.  Review of Systems: Constitutional: negative Eyes: negative Ears, nose, mouth, throat, and face: positive for dental pain, negative for sore mouth and sore throat Respiratory: negative Cardiovascular: negative Gastrointestinal: negative Genitourinary:negative  Past Medical History  Diagnosis Date  . HIV (human immunodeficiency virus infection)   . Pneumothorax     History  Substance Use Topics  . Smoking status: Former Smoker -- 0.40 packs/day for 15 years    Types: Cigarettes  . Smokeless tobacco: Never Used  . Alcohol Use: No    No family history on file.  No Known Allergies  Objective: Temp: 97.6 F (36.4 C) (02/02 1024) Temp Source: Oral (02/02 1024) BP: 122/82 mmHg (02/02 1024) Pulse Rate: 77 (02/02 1024) Body mass index is 21.73 kg/(m^2).  General: He is in good spirits Oral: No oropharyngeal lesions but poor dentition Skin: No rash Lungs: Clear Cor: Regular S1 and S2 with no murmurs Abdomen: Soft and nontender with no palpable masses Joints and extremities: Normal Neuro: Alert with normal speech and conversation Mood: Normal without signs of depression or anxiety  Lab Results Lab Results  Component Value Date   WBC 5.7 04/27/2013   HGB 11.7* 04/27/2013   HCT 33.7* 04/27/2013   MCV 84.9 04/27/2013   PLT 179 04/27/2013  Lab Results  Component Value Date   CREATININE 0.86 01/13/2014   BUN 12 01/13/2014   NA 138 01/13/2014   K 4.4 01/13/2014   CL 106 01/13/2014   CO2 26 01/13/2014    Lab Results  Component Value Date   ALT 33 01/13/2014   AST 30 01/13/2014   ALKPHOS 75 01/13/2014   BILITOT 0.4 01/13/2014    Lab Results  Component Value Date   CHOL 152 01/13/2014   HDL 39* 01/13/2014   LDLCALC 102* 01/13/2014   TRIG 55 01/13/2014   CHOLHDL 3.9 01/13/2014    Lab Results HIV 1 RNA  QUANT (copies/mL)  Date Value  01/13/2014 <20  05/07/2013 <20  03/05/2013 21   CD4 T CELL ABS (/uL)  Date Value  01/13/2014 260*  05/07/2013 360*  03/05/2013 170*     Assessment: His HIV remains under good control. I will change Stribild to the new preparation called [name].  I will check his hepatitis C viral load and genotype today. If his viral load is detectable he will need ultrasound with elastography in anticipation of starting hepatitis C treatment. He remains sober and has had not had any alcohol in the past 2 years.  He has poor dentition but is stable and cleared for full mouth extraction at any time.  Plan: 1. Change Stribild to [name] 2. Check a hepatitis C viral load and genotype 3. Follow-up in one month   Michel Bickers, MD [facility] for [place] [phone] pager   [phone] cell 03/11/2014, 10:46 AM

## 2014-03-11 NOTE — Progress Notes (Addendum)
Patient ID: BIANCA VESTER, male   DOB: 1960/11/16, 54 y.o.   MRN: [id number] HPI: JOSEFF LUCKMAN is a 54 y.o. male who is here for his HIV f/u.   No results found for: HCVGENOTYPE, HEPCGENOTYPE  Allergies: No Known Allergies  Vitals: Temp: 97.6 F (36.4 C) (02/02 1024) Temp Source: Oral (02/02 1024) BP: 122/82 mmHg (02/02 1024) Pulse Rate: 77 (02/02 1024)  Past Medical History: Past Medical History  Diagnosis Date  . HIV (human immunodeficiency virus infection)   . Pneumothorax     Social History: History   Social History  . Marital Status: Married    Spouse Name: N/A    Number of Children: N/A  . Years of Education: N/A   Social History Main Topics  . Smoking status: Former Smoker -- 0.40 packs/day for 15 years    Types: Cigarettes  . Smokeless tobacco: Never Used  . Alcohol Use: No  . Drug Use: No  . Sexual Activity: Not Currently     Comment: declined condoms   Other Topics Concern  . Not on file   Social History Narrative    Labs: HIV 1 RNA QUANT (copies/mL)  Date Value  01/13/2014 <20  05/07/2013 <20  03/05/2013 21   CD4 T CELL ABS (/uL)  Date Value  01/13/2014 260*  05/07/2013 360*  03/05/2013 170*   HEP B S AB (no units)  Date Value  03/09/2005 positive   HEPATITIS B SURFACE AG (no units)  Date Value  10/15/2009 NEG   HCV AB (no units)  Date Value  09/17/2001 [positive    No results found for: HCVGENOTYPE, HEPCGENOTYPE  No flowsheet data found.  AST (U/L)  Date Value  01/13/2014 30  04/25/2013 37  04/24/2013 43*   ALT (U/L)  Date Value  01/13/2014 33  04/25/2013 64*  04/24/2013 75*   INR (no units)  Date Value  04/25/2013 1.06  04/24/2013 1.02  08/20/2008 1.0    CrCl: CrCl cannot be calculated (Unknown ideal weight.).  Fibrosis Score: Not done yet  Child-Pugh Score: Class A  Previous Treatment Regimen: Naive  Assessment: Robbi is here for his f/u. He has been well controlled on Stribild. He is  having some marital issue. He is hep C positive but no VL or genotype. His wife was also positive and got approval for Harvoni a while back through [facility]. He is interested in getting treatment but he has no genotype or VL. Not sure if he has active chronic infection. We are going to get labs today and see before deciding on [place] elastography. Apparently, he has [employer] insurance.   Recommendations:  Cont [name] 1 PO qday Get hep C VL/genotype  Onnie Boer Bryan, [place].D., BCPS, AAHIVP Clinical Infectious [place] for Infectious Disease 03/11/2014, 1:42 PM

## 2014-03-12 LAB — HEPATITIS C RNA QUANTITATIVE
HCV Quantitative Log: 5.14 {Log} — ABNORMAL HIGH (ref <1.18)
HCV Quantitative: 138536 IU/mL — ABNORMAL HIGH (ref <15)

## 2014-03-13 LAB — HEPATITIS C GENOTYPE: HCV Genotype: 1

## 2014-03-17 ENCOUNTER — Telehealth: Payer: Self-pay | Admitting: *Deleted

## 2014-03-17 ENCOUNTER — Other Ambulatory Visit: Payer: Self-pay | Admitting: *Deleted

## 2014-03-17 DIAGNOSIS — B182 Chronic viral hepatitis C: Secondary | ICD-10-CM

## 2014-03-17 NOTE — Telephone Encounter (Signed)
Relayed message to the patient

## 2014-03-17 NOTE — Telephone Encounter (Signed)
RN called patient to notify him of upcoming appointment.  Patient's listed home phone number belongs to his aunt.  His aunt will ask him to call Dr. Hale Bogus office for information.  She states she will get him this message today. Per Dr. Megan Salon and Petronila, patient needs to have elastography prior to starting treatment for Hep C.  Patient is scheduled for 2/16 at [facility], 9:00.  He needs to arrive at 8:45.  Patient needs to be NPO for 6 hours prior to procedure.   Landis Gandy, RN

## 2014-03-25 ENCOUNTER — Ambulatory Visit (HOSPITAL_COMMUNITY)
Admission: RE | Admit: 2014-03-25 | Discharge: 2014-03-25 | Disposition: A | Discharge status: Home / Self Care | Payer: Medicare Other | Source: Ambulatory Visit | Attending: Internal Medicine | Admitting: Internal Medicine

## 2014-03-25 ENCOUNTER — Ambulatory Visit (HOSPITAL_COMMUNITY): Admission: RE | Admit: 2014-03-25 | Payer: Medicare Other | Source: Ambulatory Visit

## 2014-03-25 DIAGNOSIS — B182 Chronic viral hepatitis C: Secondary | ICD-10-CM | POA: Diagnosis not present

## 2014-03-31 ENCOUNTER — Other Ambulatory Visit: Payer: Self-pay | Admitting: Pharmacist Clinician (PhC)/ Clinical Pharmacy Specialist

## 2014-03-31 MED ORDER — LEDIPASVIR-SOFOSBUVIR 90-400 MG PO TABS: 1.0000 | ORAL_TABLET | Freq: Every day | ORAL | Status: DC

## 2014-04-02 ENCOUNTER — Telehealth: Payer: Self-pay | Admitting: *Deleted

## 2014-04-02 NOTE — Telephone Encounter (Signed)
Message from [place] at [facility] that patient has picked up [place] today. Spoke with patient and he will start it tomorrow, 04/03/14. Reminded him of upcoming appointment with Dr. Megan Salon on 04/08/14. Myrtis Hopping

## 2014-04-03 ENCOUNTER — Telehealth: Payer: Self-pay | Admitting: Licensed Clinical Social Worker

## 2014-04-03 NOTE — Telephone Encounter (Signed)
Patient's dentist office called wanting medical clearance that he can have 22 teeth extracted. Patient is coming on 3/1 to see Dr. Megan Salon. Will send to Dr. Megan Salon so he can give a note to the patient at his appointment. Tried to contact patient to remind him to ask but his mailbox was full.

## 2014-04-03 NOTE — Telephone Encounter (Signed)
Please let his dentist know that William [name]'s HIV and Hepatitis C infections are not contraindications to dental extractions.

## 2014-04-07 NOTE — Telephone Encounter (Signed)
Patient coming in tomorrow,note needs to be given to patient.

## 2014-04-08 ENCOUNTER — Ambulatory Visit (INDEPENDENT_AMBULATORY_CARE_PROVIDER_SITE_OTHER): Payer: Medicare Other | Admitting: Internal Medicine

## 2014-04-08 ENCOUNTER — Encounter: Payer: Self-pay | Admitting: *Deleted

## 2014-04-08 VITALS — BP 113/78 | HR 78 | Temp 97.8°F | Wt 168.2 lb

## 2014-04-08 DIAGNOSIS — J302 Other seasonal allergic rhinitis: Secondary | ICD-10-CM | POA: Diagnosis not present

## 2014-04-08 DIAGNOSIS — B182 Chronic viral hepatitis C: Secondary | ICD-10-CM

## 2014-04-08 DIAGNOSIS — B20 Human immunodeficiency virus [HIV] disease: Secondary | ICD-10-CM | POA: Diagnosis not present

## 2014-04-08 MED ORDER — LORATADINE 10 MG PO TABS: 10.0000 mg | ORAL_TABLET | Freq: Every day | ORAL | Status: DC

## 2014-04-08 MED ORDER — ELVITEG-COBIC-EMTRICIT-TENOFAF 150-150-200-10 MG PO TABS: 1.0000 | ORAL_TABLET | Freq: Every day | ORAL | Status: DC

## 2014-04-08 NOTE — Progress Notes (Signed)
Patient ID: William [name], male   DOB: 1960-07-17, 54 y.o.   MRN: [id number]          Patient Active Problem List   Diagnosis Date Noted  . Human immunodeficiency virus (HIV) disease 07/10/2006    Priority: High  . Chronic hepatitis C without hepatic coma 07/10/2006    Priority: Medium  . Spontaneous pneumothorax 04/24/2013  . Seasonal allergies 01/22/2013  . Dental abscess 11/24/2011  . OTHER NONSPECIFIC ABNORMAL SERUM ENZYME LEVELS 02/16/2010  . SPONTANEOUS PNEUMOTHORAX 10/16/2008  . PNEUMOCYSTIS PNEUMONIA 07/10/2006  . LOW BACK PAIN 07/10/2006  . ALCOHOL ABUSE, HX OF 07/10/2006    Patient's Medications  New Prescriptions   No medications on file  Previous Medications   CLINDAMYCIN (CLEOCIN) 150 MG CAPSULE    Take 1 capsule (150 mg total) by mouth every 6 (six) hours.   LEDIPASVIR-SOFOSBUVIR (HARVONI) 90-400 MG TABS    Take 1 tablet by mouth daily before breakfast.   TRAMADOL (ULTRAM) 50 MG TABLET    Take 1 tablet (50 mg total) by mouth every 6 (six) hours as needed.  Modified Medications   Modified Medication Previous Medication   ELVITEGRAVIR-COBICISTAT-EMTRICITABINE-TENOFOVIR ([name]) 150-150-200-10 MG TABS TABLET elvitegravir-cobicistat-emtricitabine-tenofovir ([name]) 150-150-200-10 MG TABS tablet      Take 1 tablet by mouth daily with breakfast.    Take 1 tablet by mouth daily with breakfast.   LORATADINE (CLARITIN) 10 MG TABLET loratadine (CLARITIN) 10 MG tablet      Take 1 tablet (10 mg total) by mouth daily.    Take 1 tablet (10 mg total) by mouth daily.  Discontinued Medications   No medications on file    Subjective: William [name] is in for his routine follow-up of his chronic HIV and hepatitis C infections. He is tolerating [name] and has not missed any doses. He started on Harvoni about one week ago. He had some nausea with his first dose but no vomiting. He has had no problems tolerating it since that time. He is hoping to have dental work and full mouth  extractions soon. His appetite is much better and he is gaining some weight. He has been feeling steadily better over the last year.  Review of Systems: Constitutional: negative Eyes: negative Ears, nose, mouth, throat, and face: negative Respiratory: negative Cardiovascular: negative Gastrointestinal: negative Genitourinary:negative  Past Medical History  Diagnosis Date  . HIV (human immunodeficiency virus infection)   . Pneumothorax     History  Substance Use Topics  . Smoking status: Former Smoker -- 0.40 packs/day for 15 years    Types: Cigarettes  . Smokeless tobacco: Never Used  . Alcohol Use: No    No family history on file.  No Known Allergies  Objective: Temp: 97.8 F (36.6 C) (03/01 1023) Temp Source: Oral (03/01 1023) BP: 113/78 mmHg (03/01 1023) Pulse Rate: 78 (03/01 1023) Body mass index is 23.47 kg/(m^2).  General: He is smiling and in good spirits  Oral: Poor dentition with multiple loose and missing teeth. No oropharyngeal lesions Skin: No rash Lungs: Clear Cor: Regular S1 and S2 with no murmur Abdomen: Soft and nontender   Lab Results Lab Results  Component Value Date   WBC 5.7 04/27/2013   HGB 11.7* 04/27/2013   HCT 33.7* 04/27/2013   MCV 84.9 04/27/2013   PLT 179 04/27/2013    Lab Results  Component Value Date   CREATININE 0.86 01/13/2014   BUN 12 01/13/2014   NA 138 01/13/2014   K 4.4 01/13/2014   CL  106 01/13/2014   CO2 26 01/13/2014    Lab Results  Component Value Date   ALT 33 01/13/2014   AST 30 01/13/2014   ALKPHOS 75 01/13/2014   BILITOT 0.4 01/13/2014    Lab Results  Component Value Date   CHOL 152 01/13/2014   HDL 39* 01/13/2014   LDLCALC 102* 01/13/2014   TRIG 55 01/13/2014   CHOLHDL 3.9 01/13/2014    Lab Results HIV 1 RNA QUANT (copies/mL)  Date Value  01/13/2014 <20  05/07/2013 <20  03/05/2013 21   CD4 T CELL ABS (/uL)  Date Value  01/13/2014 260*  05/07/2013 360*  03/05/2013 170*   Hepatitis C  RNA 03/11/2014: 161,096   Assessment: His HIV infection remains under good control and these had steady CD4 reconstitution over the past 2 years.  He is off to a good start with hepatitis C therapy. I will repeat his hepatitis C viral load in 3 weeks.  He has very poor oral health and will benefit from extractions and eventually full dentures.  Plan: 1. Continue current therapy 2. Repeat hepatitis C viral load in 3 weeks 3. Follow-up in 2 months   Michel Bickers, MD [facility] for Infectious [place] [phone] pager   [phone] cell 04/08/2014, 10:56 AM

## 2014-05-07 ENCOUNTER — Other Ambulatory Visit (INDEPENDENT_AMBULATORY_CARE_PROVIDER_SITE_OTHER): Payer: Medicare Other

## 2014-05-07 DIAGNOSIS — B182 Chronic viral hepatitis C: Secondary | ICD-10-CM | POA: Diagnosis not present

## 2014-05-08 LAB — HEPATITIS C RNA QUANTITATIVE
HCV Quantitative Log: <1.18 {Log} (ref <1.18)
HCV Quantitative: <15 IU/mL (ref <15)

## 2014-05-16 ENCOUNTER — Inpatient Hospital Stay (HOSPITAL_COMMUNITY)
Admission: RE | Admit: 2014-05-16 | Discharge: 2014-05-16 | Disposition: A | Discharge status: Home / Self Care | Payer: Medicare Other | Source: Ambulatory Visit

## 2014-05-16 ENCOUNTER — Other Ambulatory Visit (HOSPITAL_COMMUNITY): Payer: Self-pay | Admitting: *Deleted

## 2014-05-16 ENCOUNTER — Encounter (HOSPITAL_COMMUNITY): Payer: Self-pay

## 2014-05-16 NOTE — Pre-Procedure Instructions (Signed)
MARQUAVION VENHUIZEN  05/16/2014   Your procedure is scheduled on:  05/23/14  Report to [facility] short stay admitting at 48 AM.  Call this number if you have problems the morning of surgery: [phone]   Remember:   Do not eat food or drink liquids after midnight.   Take these medicines the morning of surgery with A SIP OF WATER: clindamycin,genvoya,harvoni,pain med if needed   STOP all herbel meds, nsaids (aleve,naproxen,advil,ibuprofen) 5 days prior to surgery starting 05/18/14 including vitamins ,aspirin   Do not wear jewelry, make-up or nail polish.  Do not wear lotions, powders, or perfumes. You may wear deodorant.  Do not shave 48 hours prior to surgery. Men may shave face and neck.  Do not bring valuables to the hospital.  [facility] is not responsible                  for any belongings or valuables.               Contacts, dentures or bridgework may not be worn into surgery.  Leave suitcase in the car. After surgery it may be brought to your room.  For patients admitted to the hospital, discharge time is determined by your                treatment team.               Patients discharged the day of surgery will not be allowed to drive  home.  Name and phone number of your driver:   Special Instructions:  Special Instructions: [place] - Preparing for Surgery  Before surgery, you can play an important role.  Because skin is not sterile, your skin needs to be as free of germs as possible.  You can reduce the number of germs on you skin by washing with CHG (chlorahexidine gluconate) soap before surgery.  CHG is an antiseptic cleaner which kills germs and bonds with the skin to continue killing germs even after washing.  Please DO NOT use if you have an allergy to CHG or antibacterial soaps.  If your skin becomes reddened/irritated stop using the CHG and inform your nurse when you arrive at Short Stay.  Do not shave (including legs and underarms) for at least 48 hours prior to  the first CHG shower.  You may shave your face.  Please follow these instructions carefully:   1.  Shower with CHG Soap the night before surgery and the morning of Surgery.  2.  If you choose to wash your hair, wash your hair first as usual with your normal shampoo.  3.  After you shampoo, rinse your hair and body thoroughly to remove the Shampoo.  4.  Use CHG as you would any other liquid soap.  You can apply chg directly  to the skin and wash gently with scrungie or a clean washcloth.  5.  Apply the CHG Soap to your body ONLY FROM THE NECK DOWN.  Do not use on open wounds or open sores.  Avoid contact with your eyes ears, mouth and genitals (private parts).  Wash genitals (private parts)       with your normal soap.  6.  Wash thoroughly, paying special attention to the area where your surgery will be performed.  7.  Thoroughly rinse your body with warm water from the neck down.  8.  DO NOT shower/wash with your normal soap after using and rinsing off the CHG Soap.  9.  Pat yourself dry with a clean towel.            10.  Wear clean pajamas.            11.  Place clean sheets on your bed the night of your first shower and do not sleep with pets.  Day of Surgery  Do not apply any lotions/deodorants the morning of surgery.  Please wear clean clothes to the hospital/surgery center.   Please read over the following fact sheets that you were given: Pain Booklet, Coughing and Deep Breathing and Surgical Site Infection Prevention

## 2014-05-22 MED ORDER — CEFAZOLIN SODIUM-DEXTROSE 2-3 GM-% IV SOLR
2.0000 g | INTRAVENOUS | Status: AC
  Administered 2014-05-23: 2 g via INTRAVENOUS
  Filled 2014-05-22: qty 50

## 2014-05-22 NOTE — H&P (Signed)
HISTORY AND PHYSICAL  William [name] is a 54 y.o. male patient with CC: painful teeth. Dentist referred for pre-denture surgery.  No diagnosis found.  Past Medical History  Diagnosis Date  . HIV (human immunodeficiency virus infection)   . Pneumothorax     No current facility-administered medications for this encounter.   Current Outpatient Prescriptions  Medication Sig Dispense Refill  . elvitegravir-cobicistat-emtricitabine-tenofovir ([name]) 150-150-200-10 MG TABS tablet Take 1 tablet by mouth daily with breakfast. 30 tablet 11  . Ledipasvir-Sofosbuvir (HARVONI) 90-400 MG TABS Take 1 tablet by mouth daily before breakfast. 28 tablet 2  . clindamycin (CLEOCIN) 150 MG capsule Take 1 capsule (150 mg total) by mouth every 6 (six) hours. (Patient not taking: Reported on 05/16/2014) 28 capsule 0  . loratadine (CLARITIN) 10 MG tablet Take 1 tablet (10 mg total) by mouth daily. (Patient not taking: Reported on 05/16/2014) 30 tablet 6  . traMADol (ULTRAM) 50 MG tablet Take 1 tablet (50 mg total) by mouth every 6 (six) hours as needed. (Patient not taking: Reported on 05/16/2014) 20 tablet 0   No Known Allergies Active Problems:   * No active hospital problems. *  Vitals: There were no vitals taken for this visit. Lab results:No results found for this or any previous visit (from the past 75 hour(s)). Radiology Results: No results found. General appearance: alert, cooperative and no distress Head: Normocephalic, without obvious abnormality, atraumatic Eyes: negative Nose: Nares normal. Septum midline. Mucosa normal. No drainage or sinus tenderness. Throat: multiple carious teeth, chronic periodontial disease; right maxillary hyperplastic osseous tuberosity; maxillary left buccal exostosis; pharynx cleaqr Neck: no adenopathy, supple, symmetrical, trachea midline and thyroid not enlarged, symmetric, no tenderness/mass/nodules Resp: clear to auscultation bilaterally Cardio: regular rate and  rhythm, S1, S2 normal, no murmur, click, rub or gallop  Assessment: multiple nonrestorable teeth, chronic periodontial disease; right maxillary hyperplastic osseous tuberosity; maxillary left buccal exostosis   Plan:Full mouth extractions, alveoloplasty, removal maxillary left buccal exostosis, reduction right maxillary hyperplastic tuberosity; general anesthesia. Day surgery.   Gae Bon 05/22/2014

## 2014-05-23 ENCOUNTER — Encounter (HOSPITAL_COMMUNITY): Payer: Self-pay | Admitting: *Deleted

## 2014-05-23 ENCOUNTER — Ambulatory Visit (HOSPITAL_COMMUNITY)
Admission: RE | Admit: 2014-05-23 | Discharge: 2014-05-23 | Disposition: A | Discharge status: Home / Self Care | Payer: Medicare Other | Source: Ambulatory Visit | Attending: Oral Surgery | Admitting: Oral Surgery

## 2014-05-23 ENCOUNTER — Ambulatory Visit (HOSPITAL_COMMUNITY): Payer: Medicare Other | Admitting: Anesthesiology

## 2014-05-23 ENCOUNTER — Encounter (HOSPITAL_COMMUNITY)
Admission: RE | Disposition: A | Discharge status: Home / Self Care | Payer: Self-pay | Source: Ambulatory Visit | Attending: Oral Surgery

## 2014-05-23 DIAGNOSIS — Z79891 Long term (current) use of opiate analgesic: Secondary | ICD-10-CM | POA: Diagnosis not present

## 2014-05-23 DIAGNOSIS — M278 Other specified diseases of jaws: Secondary | ICD-10-CM | POA: Insufficient documentation

## 2014-05-23 DIAGNOSIS — Z87891 Personal history of nicotine dependence: Secondary | ICD-10-CM | POA: Diagnosis not present

## 2014-05-23 DIAGNOSIS — Z79899 Other long term (current) drug therapy: Secondary | ICD-10-CM | POA: Diagnosis not present

## 2014-05-23 DIAGNOSIS — K029 Dental caries, unspecified: Secondary | ICD-10-CM | POA: Insufficient documentation

## 2014-05-23 DIAGNOSIS — Z792 Long term (current) use of antibiotics: Secondary | ICD-10-CM | POA: Diagnosis not present

## 2014-05-23 DIAGNOSIS — Z21 Asymptomatic human immunodeficiency virus [HIV] infection status: Secondary | ICD-10-CM | POA: Diagnosis not present

## 2014-05-23 DIAGNOSIS — K0530 Chronic periodontitis, unspecified: Secondary | ICD-10-CM | POA: Insufficient documentation

## 2014-05-23 DIAGNOSIS — K088 Other specified disorders of teeth and supporting structures: Secondary | ICD-10-CM | POA: Diagnosis not present

## 2014-05-23 DIAGNOSIS — J9383 Other pneumothorax: Secondary | ICD-10-CM | POA: Diagnosis not present

## 2014-05-23 HISTORY — PX: MULTIPLE EXTRACTIONS WITH ALVEOLOPLASTY: SHX5342

## 2014-05-23 LAB — COMPREHENSIVE METABOLIC PANEL
ALT: 18 U/L (ref 0–53)
AST: 24 U/L (ref 0–37)
Albumin: 3.9 g/dL (ref 3.5–5.2)
Alkaline Phosphatase: 71 U/L (ref 39–117)
Anion gap: 9 (ref 5–15)
BUN: 10 mg/dL (ref 6–23)
CO2: 24 mmol/L (ref 19–32)
Calcium: 9.1 mg/dL (ref 8.4–10.5)
Chloride: 105 mmol/L (ref 96–112)
Creatinine, Ser: 0.91 mg/dL (ref 0.50–1.35)
GFR calc Af Amer: >90 mL/min (ref >90)
GFR calc non Af Amer: >90 mL/min (ref >90)
Glucose, Bld: 98 mg/dL (ref 70–99)
Potassium: 4.4 mmol/L (ref 3.5–5.1)
Sodium: 138 mmol/L (ref 135–145)
Total Bilirubin: 0.3 mg/dL (ref 0.3–1.2)
Total Protein: 7.9 g/dL (ref 6.0–8.3)

## 2014-05-23 LAB — CBC
HCT: 43.6 % (ref 39.0–52.0)
Hemoglobin: 14.3 g/dL (ref 13.0–17.0)
MCH: 28.5 pg (ref 26.0–34.0)
MCHC: 32.8 g/dL (ref 30.0–36.0)
MCV: 87 fL (ref 78.0–100.0)
Platelets: 188 10*3/uL (ref 150–400)
RBC: 5.01 MIL/uL (ref 4.22–5.81)
RDW: 14 % (ref 11.5–15.5)
WBC: 4.4 10*3/uL (ref 4.0–10.5)

## 2014-05-23 LAB — PROTIME-INR
INR: 1.04 (ref 0.00–1.49)
Prothrombin Time: 13.7 seconds (ref 11.6–15.2)

## 2014-05-23 LAB — APTT: aPTT: 28 seconds (ref 24–37)

## 2014-05-23 SURGERY — MULTIPLE EXTRACTION WITH ALVEOLOPLASTY
Anesthesia: General | Site: Mouth

## 2014-05-23 MED ORDER — OXYCODONE HCL 5 MG/5ML PO SOLN: 5.0000 mg | Freq: Once | ORAL | Status: DC | PRN

## 2014-05-23 MED ORDER — ONDANSETRON HCL 4 MG/2ML IJ SOLN
INTRAMUSCULAR | Status: AC
  Filled 2014-05-23: qty 2

## 2014-05-23 MED ORDER — FENTANYL CITRATE (PF) 100 MCG/2ML IJ SOLN
INTRAMUSCULAR | Status: AC
  Administered 2014-05-23: 50 ug via INTRAVENOUS
  Filled 2014-05-23: qty 2

## 2014-05-23 MED ORDER — ONDANSETRON HCL 4 MG/2ML IJ SOLN
INTRAMUSCULAR | Status: DC | PRN
  Administered 2014-05-23: 4 mg via INTRAVENOUS

## 2014-05-23 MED ORDER — OXYCODONE HCL 5 MG PO TABS: 5.0000 mg | ORAL_TABLET | ORAL | Status: DC | PRN

## 2014-05-23 MED ORDER — ONDANSETRON HCL 4 MG/2ML IJ SOLN: 4.0000 mg | Freq: Once | INTRAMUSCULAR | Status: DC | PRN

## 2014-05-23 MED ORDER — SODIUM CHLORIDE 0.9 % IV SOLN
INTRAVENOUS | Status: DC
  Administered 2014-05-23: 35 mL/h via INTRAVENOUS

## 2014-05-23 MED ORDER — PROPOFOL 10 MG/ML IV BOLUS
INTRAVENOUS | Status: DC | PRN
  Administered 2014-05-23: 190 mg via INTRAVENOUS

## 2014-05-23 MED ORDER — NEOSTIGMINE METHYLSULFATE 10 MG/10ML IV SOLN
INTRAVENOUS | Status: DC | PRN
  Administered 2014-05-23: 4 mg via INTRAVENOUS

## 2014-05-23 MED ORDER — OXYMETAZOLINE HCL 0.05 % NA SOLN
NASAL | Status: DC | PRN
  Administered 2014-05-23: 2 via NASAL

## 2014-05-23 MED ORDER — ARTIFICIAL TEARS OP OINT
TOPICAL_OINTMENT | OPHTHALMIC | Status: DC | PRN
  Administered 2014-05-23: 1 via OPHTHALMIC

## 2014-05-23 MED ORDER — SODIUM CHLORIDE 0.9 % IV SOLN
INTRAVENOUS | Status: DC | PRN
  Administered 2014-05-23 (×2): via INTRAVENOUS

## 2014-05-23 MED ORDER — SODIUM CHLORIDE 0.9 % IR SOLN
Status: DC | PRN
  Administered 2014-05-23: 1000 mL

## 2014-05-23 MED ORDER — MIDAZOLAM HCL 2 MG/2ML IJ SOLN
INTRAMUSCULAR | Status: AC
  Filled 2014-05-23: qty 2

## 2014-05-23 MED ORDER — 0.9 % SODIUM CHLORIDE (POUR BTL) OPTIME
TOPICAL | Status: DC | PRN
  Administered 2014-05-23: 1000 mL

## 2014-05-23 MED ORDER — MIDAZOLAM HCL 5 MG/5ML IJ SOLN
INTRAMUSCULAR | Status: DC | PRN
  Administered 2014-05-23: 2 mg via INTRAVENOUS

## 2014-05-23 MED ORDER — DEXAMETHASONE SODIUM PHOSPHATE 4 MG/ML IJ SOLN
INTRAMUSCULAR | Status: DC | PRN
  Administered 2014-05-23: 4 mg via INTRAVENOUS

## 2014-05-23 MED ORDER — AMOXICILLIN 500 MG PO CAPS: 500.0000 mg | ORAL_CAPSULE | Freq: Three times a day (TID) | ORAL | Status: DC

## 2014-05-23 MED ORDER — FENTANYL CITRATE (PF) 100 MCG/2ML IJ SOLN
25.0000 ug | INTRAMUSCULAR | Status: DC | PRN
  Administered 2014-05-23: 50 ug via INTRAVENOUS

## 2014-05-23 MED ORDER — GLYCOPYRROLATE 0.2 MG/ML IJ SOLN
INTRAMUSCULAR | Status: DC | PRN
  Administered 2014-05-23: 0.6 mg via INTRAVENOUS

## 2014-05-23 MED ORDER — ROCURONIUM BROMIDE 100 MG/10ML IV SOLN
INTRAVENOUS | Status: DC | PRN
  Administered 2014-05-23: 40 mg via INTRAVENOUS

## 2014-05-23 MED ORDER — LIDOCAINE-EPINEPHRINE 2 %-1:100000 IJ SOLN
INTRAMUSCULAR | Status: AC
  Filled 2014-05-23: qty 2

## 2014-05-23 MED ORDER — OXYCODONE HCL 5 MG PO TABS: 5.0000 mg | ORAL_TABLET | Freq: Once | ORAL | Status: DC | PRN

## 2014-05-23 MED ORDER — FENTANYL CITRATE (PF) 250 MCG/5ML IJ SOLN
INTRAMUSCULAR | Status: AC
  Filled 2014-05-23: qty 5

## 2014-05-23 MED ORDER — LIDOCAINE HCL (CARDIAC) 20 MG/ML IV SOLN
INTRAVENOUS | Status: DC | PRN
  Administered 2014-05-23: 50 mg via INTRAVENOUS

## 2014-05-23 MED ORDER — FENTANYL CITRATE (PF) 100 MCG/2ML IJ SOLN
INTRAMUSCULAR | Status: DC | PRN
  Administered 2014-05-23: 100 ug via INTRAVENOUS
  Administered 2014-05-23 (×3): 50 ug via INTRAVENOUS

## 2014-05-23 MED ORDER — LIDOCAINE-EPINEPHRINE 2 %-1:100000 IJ SOLN
INTRAMUSCULAR | Status: DC | PRN
  Administered 2014-05-23: 20 mL via INTRADERMAL

## 2014-05-23 SURGICAL SUPPLY — 29 items
BUR CROSS CUT FISSURE 1.6 (BURR) ×2 IMPLANT
BUR CROSS CUT FISSURE 1.6MM (BURR) ×1
BUR EGG ELITE 4.0 (BURR) ×1 IMPLANT
BUR EGG ELITE 4.0MM (BURR) ×1
CANISTER SUCTION 2500CC (MISCELLANEOUS) ×3 IMPLANT
COVER SURGICAL LIGHT HANDLE (MISCELLANEOUS) ×3 IMPLANT
CRADLE DONUT ADULT HEAD (MISCELLANEOUS) ×3 IMPLANT
FLUID NSS /IRRIG 1000 ML XXX (MISCELLANEOUS) ×3 IMPLANT
GAUZE PACKING FOLDED 2  STR (GAUZE/BANDAGES/DRESSINGS) ×2
GAUZE PACKING FOLDED 2 STR (GAUZE/BANDAGES/DRESSINGS) ×1 IMPLANT
GLOVE BIO SURGEON STRL SZ 6.5 (GLOVE) ×2 IMPLANT
GLOVE BIO SURGEON STRL SZ7.5 (GLOVE) ×3 IMPLANT
GLOVE BIO SURGEONS STRL SZ 6.5 (GLOVE) ×1
GLOVE BIOGEL PI IND STRL 7.0 (GLOVE) ×1 IMPLANT
GLOVE BIOGEL PI INDICATOR 7.0 (GLOVE) ×2
GOWN STRL REUS W/ TWL LRG LVL3 (GOWN DISPOSABLE) ×1 IMPLANT
GOWN STRL REUS W/ TWL XL LVL3 (GOWN DISPOSABLE) ×1 IMPLANT
GOWN STRL REUS W/TWL LRG LVL3 (GOWN DISPOSABLE) ×3
GOWN STRL REUS W/TWL XL LVL3 (GOWN DISPOSABLE) ×3
KIT BASIN OR (CUSTOM PROCEDURE TRAY) ×3 IMPLANT
KIT ROOM TURNOVER OR (KITS) ×3 IMPLANT
NEEDLE 22X1 1/2 (OR ONLY) (NEEDLE) ×6 IMPLANT
NS IRRIG 1000ML POUR BTL (IV SOLUTION) ×3 IMPLANT
PAD ARMBOARD 7.5X6 YLW CONV (MISCELLANEOUS) ×3 IMPLANT
SUT CHROMIC 3 0 PS 2 (SUTURE) ×6 IMPLANT
SYR CONTROL 10ML LL (SYRINGE) ×3 IMPLANT
TRAY ENT MC OR (CUSTOM PROCEDURE TRAY) ×3 IMPLANT
TUBING IRRIGATION (MISCELLANEOUS) ×3 IMPLANT
YANKAUER SUCT BULB TIP NO VENT (SUCTIONS) ×3 IMPLANT

## 2014-05-23 NOTE — Anesthesia Postprocedure Evaluation (Signed)
  Anesthesia Post-op Note  Patient: William [name]  Procedure(s) Performed: Procedure(s): EXTRACTIONS OF ALL REMAINING TEETH WITH ALVEOLOPLASTY, REMOVAL OF LEFT OSSEOUS EXOTOSIS, REMOVAL MAXILLARY OSSEOUS TUBEROSITY (N/A)  Patient Location: PACU  Anesthesia Type:General  Level of Consciousness: awake, alert  and oriented  Airway and Oxygen Therapy: Patient Spontanous Breathing and Patient connected to nasal cannula oxygen  Post-op Pain: mild  Post-op Assessment: Post-op Vital signs reviewed, Patient's Cardiovascular Status Stable, Respiratory Function Stable, Patent Airway and No signs of Nausea or vomiting  Post-op Vital Signs: stable  Last Vitals:  Filed Vitals:   05/23/14 1126  BP: 180/98  Pulse: 51  Temp:   Resp: 12    Complications: No apparent anesthesia complications

## 2014-05-23 NOTE — Anesthesia Preprocedure Evaluation (Signed)
Anesthesia Evaluation  Patient identified by MRN, date of birth, ID band Patient awake    Reviewed: Allergy & Precautions, NPO status , Patient's Chart, lab work & pertinent test results  Airway Mallampati: II  TM Distance: >3 FB Neck ROM: Full    Dental  (+) Poor Dentition   Pulmonary former smoker,  breath sounds clear to auscultation        Cardiovascular Rhythm:Regular Rate:Normal     Neuro/Psych    GI/Hepatic   Endo/Other    Renal/GU      Musculoskeletal   Abdominal   Peds  Hematology   Anesthesia Other Findings   Reproductive/Obstetrics                             Anesthesia Physical Anesthesia Plan  ASA: III  Anesthesia Plan: General   Post-op Pain Management:    Induction: Intravenous  Airway Management Planned: Nasal ETT  Additional Equipment:   Intra-op Plan:   Post-operative Plan: Extubation in OR  Informed Consent: I have reviewed the patients History and Physical, chart, labs and discussed the procedure including the risks, benefits and alternatives for the proposed anesthesia with the patient or authorized representative who has indicated his/her understanding and acceptance.     Plan Discussed with: CRNA and Anesthesiologist  Anesthesia Plan Comments:         Anesthesia Quick Evaluation

## 2014-05-23 NOTE — H&P (Signed)
H&P documentation  -History and Physical Reviewed  -Patient has been re-examined  -No change in the plan of care  William [name] M  

## 2014-05-23 NOTE — Anesthesia Procedure Notes (Signed)
Procedure Name: Intubation Date/Time: 05/23/2014 8:54 AM Performed by: Susa Loffler Pre-anesthesia Checklist: Patient identified, Timeout performed, Emergency Drugs available, Suction available and Patient being monitored Patient Re-evaluated:Patient Re-evaluated prior to inductionOxygen Delivery Method: Circle system utilized Preoxygenation: Pre-oxygenation with 100% oxygen Intubation Type: IV induction Ventilation: Mask ventilation without difficulty and Oral airway inserted - appropriate to patient size Laryngoscope Size: Mac and 4 Grade View: Grade I Nasal Tubes: Right, Nasal Rae and Nasal prep performed Tube size: 7.5 mm Number of attempts: 1 Airway Equipment and Method: Oral airway Placement Confirmation: ETT inserted through vocal cords under direct vision,  positive ETCO2 and breath sounds checked- equal and bilateral Secured at: 29 cm Tube secured with: Tape Dental Injury: Teeth and Oropharynx as per pre-operative assessment

## 2014-05-23 NOTE — Progress Notes (Signed)
Gauze in mouth saturated significantly, oozing out of mouth and drooling  replaced with new dry gauze.

## 2014-05-23 NOTE — Transfer of Care (Signed)
Immediate Anesthesia Transfer of Care Note  Patient: William [name]  Procedure(s) Performed: Procedure(s): EXTRACTIONS OF ALL REMAINING TEETH WITH ALVEOLOPLASTY, REMOVAL OF LEFT OSSEOUS EXOTOSIS, REMOVAL MAXILLARY OSSEOUS TUBEROSITY (N/A)  Patient Location: PACU  Anesthesia Type:General  Level of Consciousness: awake, alert  and oriented  Airway & Oxygen Therapy: Patient Spontanous Breathing and Patient connected to face mask oxygen  Post-op Assessment: Report given to RN and Post -op Vital signs reviewed and stable  Post vital signs: Reviewed and stable  Last Vitals:  Filed Vitals:   05/23/14 0635  BP: 151/92  Pulse: 52  Temp: 36.4 C  Resp: 20    Complications: No apparent anesthesia complications

## 2014-05-23 NOTE — Progress Notes (Signed)
Report given to Teresa RN

## 2014-05-23 NOTE — Op Note (Signed)
05/23/2014  9:41 AM  PATIENT:  William [name]  54 y.o. male  PRE-OPERATIVE DIAGNOSIS:  NON RESTORABLE TEETH #'s 4, 5, 6, 7, 9, 10, 11, 12, 13, 14, 15, 16, 20, 21, 22, 23, 24, 25, 26, 27, 28, 31, 32, left maxillary exostosis, right maxillary hyperplastic maxillary tuberosity POST-OPERATIVE DIAGNOSIS:  SAME  PROCEDURE:  Procedure(s): EXTRACTIONS OF ALL REMAINING TEETH #'s 4, 5, 6, 7, 9, 10, 11, 12, 13, 14, 15, 16, 20, 21, 22, 23, 24, 25, 26, 27, 28, 31, 32, WITH ALVEOLOPLASTY, REMOVAL OF LEFT OSSEOUS EXOTOSIS, REMOVAL MAXILLARY OSSEOUS TUBEROSITY  SURGEON:  Surgeon(s): Diona Browner, DDS  ANESTHESIA:   local and general  EBL:  minimal  DRAINS: none   SPECIMEN:  No Specimen  COUNTS:  YES  PLAN OF CARE: Discharge to home after PACU  PATIENT DISPOSITION:  PACU - hemodynamically stable.   PROCEDURE DETAILS: Dictation # [id number]  Gae Bon, DMD 05/23/2014 9:41 AM

## 2014-05-23 NOTE — Op Note (Signed)
NAMESOLACE, WENDORFF             ACCOUNT NO.:  [id number]  MEDICAL RECORD NO.:  [id number]  LOCATION:  [facility]                         FACILITY:  [facility]  PHYSICIAN:  Gae Bon, M.D.  DATE OF BIRTH:  07/02/1960  DATE OF PROCEDURE: DATE OF DISCHARGE:  05/23/2014                              OPERATIVE REPORT   PREOPERATIVE DIAGNOSES:  Nonrestorable teeth numbers 4, 5, 6, 7, 9, 10, 11, 12, 13, 14, 15, 16, 20, 21, 22, 23, 24, 25, 26, 27, 28, 31, 32. Secondary dental caries and periodontitis, left maxillary buccal exostosis, right maxillary hyperplastic tuberosity.  POSTOPERATIVE DIAGNOSES:  Nonrestorable teeth numbers 4, 5, 6, 7, 9, 10, 11, 12, 13, 14, 15, 16, 20, 21, 22, 23, 24, 25, 26, 27, 28, 31, 32. Secondary dental caries and periodontitis, left maxillary buccal exostosis, right maxillary hyperplastic tuberosity.  PROCEDURE:  Extraction of teeth numbers 4, 5, 6, 7, 9, 10, 11, 12, 13, 14, 15, 16, 20, 21, 22, 23, 24, 25, 26, 27, 28, 31, and 32; alveoplasty; removal of left maxillary exostosis; removal of right maxillary osseous tuberosity.  SURGEON:  Gae Bon, M.D.  ANESTHESIA:  General nasal, Dr. Verdie Drown, attending.  DESCRIPTION OF PROCEDURE:  The patient was taken to the operating room, placed on the table in supine position.  General anesthesia was administered intravenously and a nasal endotracheal tube was placed and secured.  The eyes were protected and the patient was draped for the procedure.  Time-out was performed.  The posterior pharynx was suctioned and a throat pack was placed.  A 2% lidocaine with 1:100,000 epinephrine was infiltrated in an inferior alveolar block on the right and left side and buccal and palatal infiltration in the maxilla.  Total of 20 mL was utilized.  A bite block was placed on the right side of the mouth and a sweetheart retractor was used to retract the tongue.  A #15 blade was used to make an incision in the mandible beginning at  tooth #20 and carrying forward to tooth #26 on the buccal and lingual surfaces, in the gingival sulcus.  Another incision was made in the maxilla beginning at tooth #16 and carrying forward in the gingival sulcus to tooth #7 on the palatal and buccal aspects.  Then, the periosteum was reflected from the maxilla and mandible on the left side with the periosteal elevator.  The 301 elevator was used to elevate the teeth and the teeth removed from the mouth with a dental forceps.  The sockets were curetted and the periosteum was further reflected to expose the alveolus and in the maxilla to expose the buccal exostosis which was posterior in the maxilla.  Then, an egg-shaped bur was used to perform alveoplasty in the upper and lower alveolar ridges and to reduce the left buccal exostosis in the posterior maxilla, then the bone file was used to further smooth these areas and the areas were irrigated and closed with 3-0 chromic. The bite block and sweetheart retractor were repositioned at the other side of the mouth.  The 15 blade was used to make an incision palatally and buccally around teeth numbers 4, 5, 6 in the maxilla and around teeth numbers 27,  28, 31, 32 in the mandible.  The periosteum was reflected with a periosteal elevator.  The teeth were elevated with a 301 elevator and removed from the mouth with a dental forceps.  Upon removal of tooth #5, the tooth fractured and 3 root tips were remaining. They were removed using the Stryker handpiece to remove interseptal bone and the root tip picks with a root tip suction were used to retrieve the root tips.  Then, the periosteum was reflected in the upper and lower arch and alveoplasty was performed.  The tuberosity was reduced using the egg-shaped bur and bone file and the posterior maxilla and then the areas were irrigated and closed with 3-0 chromic.  The oral cavity was inspected and found to have good contour, hemostasis, and closure.   A throat pack was removed.  The patient was awakened and taken to the recovery room, breathing spontaneously in good condition.  EBL:  Minimal.  COMPLICATIONS:  None.  SPECIMENS:  None.     Gae Bon, M.D.     SMJ/MEDQ  D:  05/23/2014  T:  05/23/2014  Job:  [id number]

## 2014-05-26 ENCOUNTER — Encounter (HOSPITAL_COMMUNITY): Payer: Self-pay | Admitting: Oral Surgery

## 2014-05-27 ENCOUNTER — Other Ambulatory Visit: Payer: Medicare Other

## 2014-06-05 ENCOUNTER — Other Ambulatory Visit (INDEPENDENT_AMBULATORY_CARE_PROVIDER_SITE_OTHER): Payer: Medicare Other

## 2014-06-05 DIAGNOSIS — B182 Chronic viral hepatitis C: Secondary | ICD-10-CM

## 2014-06-06 LAB — HEPATITIS C RNA QUANTITATIVE: HCV Quantitative: NOT DETECTED IU/mL (ref <15)

## 2014-06-10 ENCOUNTER — Ambulatory Visit (INDEPENDENT_AMBULATORY_CARE_PROVIDER_SITE_OTHER): Payer: Medicare Other | Admitting: Internal Medicine

## 2014-06-10 ENCOUNTER — Other Ambulatory Visit: Payer: Self-pay | Admitting: *Deleted

## 2014-06-10 ENCOUNTER — Encounter: Payer: Self-pay | Admitting: Internal Medicine

## 2014-06-10 DIAGNOSIS — B182 Chronic viral hepatitis C: Secondary | ICD-10-CM | POA: Diagnosis not present

## 2014-06-10 DIAGNOSIS — B20 Human immunodeficiency virus [HIV] disease: Secondary | ICD-10-CM

## 2014-06-10 MED ORDER — ELVITEG-COBIC-EMTRICIT-TENOFAF 150-150-200-10 MG PO TABS
1.0000 | ORAL_TABLET | Freq: Every day | ORAL | Status: DC
Start: 2014-06-10 — End: 2015-06-09

## 2014-06-10 NOTE — Progress Notes (Signed)
Patient ID: William [name], male   DOB: 30-Nov-1960, 54 y.o.   MRN: [id number]          Patient Active Problem List   Diagnosis Date Noted  . Human immunodeficiency virus (HIV) disease 07/10/2006    Priority: High  . Chronic hepatitis C without hepatic coma 07/10/2006    Priority: Medium  . Spontaneous pneumothorax 04/24/2013  . Seasonal allergies 01/22/2013  . Dental abscess 11/24/2011  . OTHER NONSPECIFIC ABNORMAL SERUM ENZYME LEVELS 02/16/2010  . SPONTANEOUS PNEUMOTHORAX 10/16/2008  . PNEUMOCYSTIS PNEUMONIA 07/10/2006  . LOW BACK PAIN 07/10/2006  . ALCOHOL ABUSE, HX OF 07/10/2006    Patient's Medications  New Prescriptions   No medications on file  Previous Medications   AMOXICILLIN (AMOXIL) 500 MG CAPSULE    Take 1 capsule (500 mg total) by mouth 3 (three) times daily.   ELVITEGRAVIR-COBICISTAT-EMTRICITABINE-TENOFOVIR ([name]) 150-150-200-10 MG TABS TABLET    Take 1 tablet by mouth daily with breakfast.   LEDIPASVIR-SOFOSBUVIR (HARVONI) 90-400 MG TABS    Take 1 tablet by mouth daily before breakfast.   OXYCODONE (OXY IR/ROXICODONE) 5 MG IMMEDIATE RELEASE TABLET    Take 1 tablet (5 mg total) by mouth every 4 (four) hours as needed.  Modified Medications   No medications on file  Discontinued Medications   No medications on file    Subjective: William [name] is in for his routine follow-up visit. He started Harvoni around 04/02/2014 and has not missed any doses. He is going today to get his third bottle. His most recent 2 hepatitis C viral loads were undetectable. He has not missed any doses of his [name]. Unfortunately his wife died suddenly recently. He states that " I lost my best friend". He states that he is doing okay but has started back smoking cigarettes since her death.   Review of Systems: Constitutional: negative Eyes: negative Ears, nose, mouth, throat, and face: negative Respiratory: negative Cardiovascular: negative Gastrointestinal:  negative Genitourinary:negative  Past Medical History  Diagnosis Date  . HIV (human immunodeficiency virus infection)   . Pneumothorax     History  Substance Use Topics  . Smoking status: Former Smoker -- 0.40 packs/day for 15 years    Types: Cigarettes  . Smokeless tobacco: Never Used  . Alcohol Use: No    No family history on file.  No Known Allergies  Objective: Temp: 97.5 F (36.4 C) (05/03 0901) Temp Source: Oral (05/03 0901) BP: 113/77 mmHg (05/03 0901) Pulse Rate: 71 (05/03 0901) Body mass index is 21.56 kg/(m^2).  General: He is upbeat and in good spirits  Oral: Edentulous status post full mouth extractions recently  Skin: No rash  Lungs: Clear  Cor: Regular S1 and S2 with no murmurs  Abdomen: Soft and nontender    Lab Results Lab Results  Component Value Date   WBC 4.4 05/23/2014   HGB 14.3 05/23/2014   HCT 43.6 05/23/2014   MCV 87.0 05/23/2014   PLT 188 05/23/2014    Lab Results  Component Value Date   CREATININE 0.91 05/23/2014   BUN 10 05/23/2014   NA 138 05/23/2014   K 4.4 05/23/2014   CL 105 05/23/2014   CO2 24 05/23/2014    Lab Results  Component Value Date   ALT 18 05/23/2014   AST 24 05/23/2014   ALKPHOS 71 05/23/2014   BILITOT 0.3 05/23/2014    Lab Results  Component Value Date   CHOL 152 01/13/2014   HDL 39* 01/13/2014   LDLCALC 102* 01/13/2014  TRIG 55 01/13/2014   CHOLHDL 3.9 01/13/2014    Lab Results HIV 1 RNA QUANT (copies/mL)  Date Value  01/13/2014 <20  05/07/2013 <20  03/05/2013 21   CD4 T CELL ABS (/uL)  Date Value  01/13/2014 260*  05/07/2013 360*  03/05/2013 170*   Hepatitis C viral load negative 3 /016 and 05/2014   Assessment: His HIV remains under very good control. I'll continue [name].   I'm hopeful that his hepatitis C will be cured.   He is coping with the death of his wife well.   I talked to him about cigarette cessation and gave him written information about the [place] quit  line.   Plan: Continue Harvoni for 1 more month Continue [name] Repeat HIV and hepatitis C viral loads in 6 weeks Cigarette cessation counseling provided Follow-up in 2 months  Michel Bickers, MD [facility] for [place] [phone] pager   [phone] cell 06/10/2014, 9:13 AM

## 2014-07-29 ENCOUNTER — Other Ambulatory Visit: Payer: Medicare Other

## 2014-07-29 DIAGNOSIS — B20 Human immunodeficiency virus [HIV] disease: Secondary | ICD-10-CM

## 2014-07-29 DIAGNOSIS — B182 Chronic viral hepatitis C: Secondary | ICD-10-CM

## 2014-07-29 LAB — COMPREHENSIVE METABOLIC PANEL
ALT: 21 U/L (ref 0–53)
AST: 32 U/L (ref 0–37)
Albumin: 4.3 g/dL (ref 3.5–5.2)
Alkaline Phosphatase: 71 U/L (ref 39–117)
BUN: 13 mg/dL (ref 6–23)
CO2: 25 mEq/L (ref 19–32)
Calcium: 9.3 mg/dL (ref 8.4–10.5)
Chloride: 103 mEq/L (ref 96–112)
Creat: 1 mg/dL (ref 0.50–1.35)
Glucose, Bld: 78 mg/dL (ref 70–99)
Potassium: 3.9 mEq/L (ref 3.5–5.3)
Sodium: 140 mEq/L (ref 135–145)
Total Bilirubin: 0.6 mg/dL (ref 0.2–1.2)
Total Protein: 8 g/dL (ref 6.0–8.3)

## 2014-07-30 LAB — HIV-1 RNA QUANT-NO REFLEX-BLD
HIV 1 RNA Quant: <20 copies/mL (ref <20)
HIV-1 RNA Quant, Log: <1.3 {Log} (ref <1.30)

## 2014-07-30 LAB — HEPATITIS C RNA QUANTITATIVE: HCV Quantitative: NOT DETECTED IU/mL (ref <15)

## 2014-07-30 LAB — T-HELPER CELL (CD4) - (RCID CLINIC ONLY)
CD4 % Helper T Cell: 14 % — ABNORMAL LOW (ref 33–55)
CD4 T Cell Abs: 380 /uL — ABNORMAL LOW (ref 400–2700)

## 2014-08-12 ENCOUNTER — Ambulatory Visit (INDEPENDENT_AMBULATORY_CARE_PROVIDER_SITE_OTHER): Payer: Medicare Other | Admitting: Internal Medicine

## 2014-08-12 ENCOUNTER — Encounter: Payer: Self-pay | Admitting: Internal Medicine

## 2014-08-12 VITALS — BP 117/81 | HR 78 | Temp 97.8°F | Wt 167.0 lb

## 2014-08-12 DIAGNOSIS — J302 Other seasonal allergic rhinitis: Secondary | ICD-10-CM

## 2014-08-12 DIAGNOSIS — B20 Human immunodeficiency virus [HIV] disease: Secondary | ICD-10-CM | POA: Diagnosis not present

## 2014-08-12 DIAGNOSIS — Z79899 Other long term (current) drug therapy: Secondary | ICD-10-CM | POA: Diagnosis not present

## 2014-08-12 MED ORDER — LORATADINE 10 MG PO TABS: 10.0000 mg | ORAL_TABLET | Freq: Every day | ORAL | Status: DC

## 2014-08-12 MED ORDER — TRIAMCINOLONE ACETONIDE 0.1 % EX CREA: 1.0000 "application " | TOPICAL_CREAM | Freq: Two times a day (BID) | CUTANEOUS | Status: DC

## 2014-08-12 NOTE — Progress Notes (Signed)
Patient ID: William [name], male   DOB: 1960-05-22, 54 y.o.   MRN: [id number]          Patient Active Problem List   Diagnosis Date Noted  . Human immunodeficiency virus (HIV) disease 07/10/2006    Priority: High  . Chronic hepatitis C without hepatic coma 07/10/2006    Priority: Medium  . Spontaneous pneumothorax 04/24/2013  . Seasonal allergies 01/22/2013  . Dental abscess 11/24/2011  . OTHER NONSPECIFIC ABNORMAL SERUM ENZYME LEVELS 02/16/2010  . SPONTANEOUS PNEUMOTHORAX 10/16/2008  . PNEUMOCYSTIS PNEUMONIA 07/10/2006  . LOW BACK PAIN 07/10/2006  . ALCOHOL ABUSE, HX OF 07/10/2006    Patient's Medications  New Prescriptions   No medications on file  Previous Medications   ELVITEGRAVIR-COBICISTAT-EMTRICITABINE-TENOFOVIR ([name]) 150-150-200-10 MG TABS TABLET    Take 1 tablet by mouth daily with breakfast.  Modified Medications   Modified Medication Previous Medication   LORATADINE (CLARITIN) 10 MG TABLET loratadine (CLARITIN) 10 MG tablet      Take 1 tablet (10 mg total) by mouth daily.    Take 1 tablet (10 mg total) by mouth daily.   TRIAMCINOLONE CREAM (KENALOG) 0.1 % triamcinolone (KENALOG) 0.1 % cream      Apply 1 application topically 2 (two) times daily.    Apply 1 application topically 2 (two) times daily.    Discontinued Medications   AMOXICILLIN (AMOXIL) 500 MG CAPSULE    Take 1 capsule (500 mg total) by mouth 3 (three) times daily.   LEDIPASVIR-SOFOSBUVIR (HARVONI) 90-400 MG TABS    Take 1 tablet by mouth daily before breakfast.   OXYCODONE (OXY IR/ROXICODONE) 5 MG IMMEDIATE RELEASE TABLET    Take 1 tablet (5 mg total) by mouth every 4 (four) hours as needed.    Subjective: William [name] is in for his routine HIV follow-up visit. He is taking his [name] every morning with a sandwich. He has not missed any doses. He is feeling well and hoping that he can stop smoking cigarettes again soon. He is down to smoking about 3 cigarettes daily. He has been off [place] for  several months now. His seasonal allergies have flared up and he is having more nasal congestion. He wants to restart loratadine. He also continues to be bothered by the mild pruritus and rash on his forearms. This improved with triamcinolone last year.  Review of Systems: Constitutional: negative Eyes: negative Ears, nose, mouth, throat, and face: positive for nasal congestion Respiratory: negative Cardiovascular: negative Gastrointestinal: negative Genitourinary:negative  Past Medical History  Diagnosis Date  . HIV (human immunodeficiency virus infection)   . Pneumothorax     History  Substance Use Topics  . Smoking status: Former Smoker -- 0.25 packs/day for 15 years    Types: Cigarettes  . Smokeless tobacco: Never Used  . Alcohol Use: No    No family history on file.  No Known Allergies  Objective: Temp: 97.8 F (36.6 C) (07/05 0921) Temp Source: Oral (07/05 0921) BP: 117/81 mmHg (07/05 0921) Pulse Rate: 78 (07/05 0921) Body mass index is 23.3 kg/(m^2).  General: He is smiling and in good spirits Oral: No oropharyngeal lesions Skin: Some hyperpigmentation and dry skin on forearms Lungs: Clear Cor: Regular S1 and S2 with no murmur Abdomen: Soft and nontender Joints and extremities: Normal Neuro: Alert with normal speech and conversation Mood: Normal. He does not appear anxious or depressed.  Lab Results Lab Results  Component Value Date   WBC 4.4 05/23/2014   HGB 14.3 05/23/2014   HCT 43.6  05/23/2014   MCV 87.0 05/23/2014   PLT 188 05/23/2014    Lab Results  Component Value Date   CREATININE 1.00 07/29/2014   BUN 13 07/29/2014   NA 140 07/29/2014   K 3.9 07/29/2014   CL 103 07/29/2014   CO2 25 07/29/2014    Lab Results  Component Value Date   ALT 21 07/29/2014   AST 32 07/29/2014   ALKPHOS 71 07/29/2014   BILITOT 0.6 07/29/2014    Lab Results  Component Value Date   CHOL 152 01/13/2014   HDL 39* 01/13/2014   LDLCALC 102* 01/13/2014    TRIG 55 01/13/2014   CHOLHDL 3.9 01/13/2014    Lab Results HIV 1 RNA QUANT (copies/mL)  Date Value  07/29/2014 <20  01/13/2014 <20  05/07/2013 <20   CD4 T CELL ABS (/uL)  Date Value  07/29/2014 380*  01/13/2014 260*  05/07/2013 360*     Assessment: His HIV infection remains under excellent control. I will continue [name].  His hepatitis C has been cured.  I encouraged him to set a quit date and stop smoking again.  I will restart loratadine for his seasonal allergies and give him a refill of his triamcinolone cream.   Plan: Continue [name] Restart loratadine Cigarette cessation counseling provided Follow-up after lab work in 6 months  Michel Bickers, [facility] for [place] [phone] pager   [phone] cell 08/12/2014, 9:33 AM

## 2014-12-23 ENCOUNTER — Ambulatory Visit (INDEPENDENT_AMBULATORY_CARE_PROVIDER_SITE_OTHER): Payer: Medicare Other | Admitting: Internal Medicine

## 2014-12-23 VITALS — Wt 172.5 lb

## 2014-12-23 DIAGNOSIS — Z79899 Other long term (current) drug therapy: Secondary | ICD-10-CM

## 2014-12-23 DIAGNOSIS — J302 Other seasonal allergic rhinitis: Secondary | ICD-10-CM | POA: Diagnosis not present

## 2014-12-23 DIAGNOSIS — Z720 Tobacco use: Secondary | ICD-10-CM

## 2014-12-23 DIAGNOSIS — B20 Human immunodeficiency virus [HIV] disease: Secondary | ICD-10-CM

## 2014-12-23 DIAGNOSIS — B182 Chronic viral hepatitis C: Secondary | ICD-10-CM

## 2014-12-23 DIAGNOSIS — Z87891 Personal history of nicotine dependence: Secondary | ICD-10-CM | POA: Insufficient documentation

## 2014-12-23 DIAGNOSIS — F17210 Nicotine dependence, cigarettes, uncomplicated: Secondary | ICD-10-CM | POA: Insufficient documentation

## 2014-12-23 LAB — LIPID PANEL
Cholesterol: 185 mg/dL (ref 125–200)
HDL: 61 mg/dL (ref >=40)
LDL Cholesterol: 105 mg/dL (ref <130)
Total CHOL/HDL Ratio: 3 Ratio (ref <=5.0)
Triglycerides: 96 mg/dL (ref <150)
VLDL: 19 mg/dL (ref <30)

## 2014-12-23 LAB — COMPREHENSIVE METABOLIC PANEL
ALT: 21 U/L (ref 9–46)
AST: 29 U/L (ref 10–35)
Albumin: 4.5 g/dL (ref 3.6–5.1)
Alkaline Phosphatase: 56 U/L (ref 40–115)
BUN: 14 mg/dL (ref 7–25)
CO2: 23 mmol/L (ref 20–31)
Calcium: 9.3 mg/dL (ref 8.6–10.3)
Chloride: 105 mmol/L (ref 98–110)
Creat: 0.88 mg/dL (ref 0.70–1.33)
Glucose, Bld: 82 mg/dL (ref 65–99)
Potassium: 3.9 mmol/L (ref 3.5–5.3)
Sodium: 137 mmol/L (ref 135–146)
Total Bilirubin: 0.4 mg/dL (ref 0.2–1.2)
Total Protein: 7.8 g/dL (ref 6.1–8.1)

## 2014-12-23 LAB — CBC
HCT: 41 % (ref 39.0–52.0)
Hemoglobin: 13.6 g/dL (ref 13.0–17.0)
MCH: 28.8 pg (ref 26.0–34.0)
MCHC: 33.2 g/dL (ref 30.0–36.0)
MCV: 86.9 fL (ref 78.0–100.0)
MPV: 9.5 fL (ref 8.6–12.4)
Platelets: 231 10*3/uL (ref 150–400)
RBC: 4.72 MIL/uL (ref 4.22–5.81)
RDW: 14.4 % (ref 11.5–15.5)
WBC: 5.6 10*3/uL (ref 4.0–10.5)

## 2014-12-23 LAB — RPR

## 2014-12-23 MED ORDER — LORATADINE 10 MG PO TABS: 10.0000 mg | ORAL_TABLET | Freq: Every day | ORAL | Status: DC

## 2014-12-23 MED ORDER — TRIAMCINOLONE ACETONIDE 0.1 % EX CREA: 1.0000 "application " | TOPICAL_CREAM | Freq: Two times a day (BID) | CUTANEOUS | Status: DC

## 2014-12-23 MED ORDER — NICOTINE 7 MG/24HR TD PT24
7.0000 mg | MEDICATED_PATCH | Freq: Every day | TRANSDERMAL | Status: DC
Start: 2014-12-23 — End: 2015-06-02

## 2014-12-23 MED ORDER — NICOTINE 14 MG/24HR TD PT24: 14.0000 mg | MEDICATED_PATCH | Freq: Every day | TRANSDERMAL | Status: DC

## 2014-12-23 NOTE — Assessment & Plan Note (Signed)
His HIV infection remains under excellent control and he has had steady CD4 reconstitution over the past 4 years. I will continue [name] and repeat lab work today. He will follow-up in 6 months.

## 2014-12-23 NOTE — Assessment & Plan Note (Signed)
His hepatitis C has been cured.

## 2014-12-23 NOTE — Assessment & Plan Note (Signed)
He is motivated to quit smoking. He was counseled about a cigarette cessation plan. We will give him a nicotine replacement patches.

## 2014-12-23 NOTE — Progress Notes (Signed)
Patient Active Problem List   Diagnosis Date Noted  . Human immunodeficiency virus (HIV) disease ([place]) 07/10/2006    Priority: High  . Chronic hepatitis C without hepatic coma ([place]) 07/10/2006    Priority: Medium  . Cigarette smoker 12/23/2014  . Spontaneous pneumothorax 04/24/2013  . Seasonal allergies 01/22/2013  . Dental abscess 11/24/2011  . OTHER NONSPECIFIC ABNORMAL SERUM ENZYME LEVELS 02/16/2010  . PNEUMOCYSTIS PNEUMONIA 07/10/2006  . LOW BACK PAIN 07/10/2006  . ALCOHOL ABUSE, HX OF 07/10/2006    Patient's Medications  New Prescriptions   NICOTINE (NICODERM CQ - DOSED IN MG/24 HOURS) 14 MG/24HR PATCH    Place 1 patch (14 mg total) onto the skin daily.   NICOTINE (NICODERM CQ - DOSED IN MG/24 HR) 7 MG/24HR PATCH    Place 1 patch (7 mg total) onto the skin daily.  Previous Medications   ELVITEGRAVIR-COBICISTAT-EMTRICITABINE-TENOFOVIR ([name]) 150-150-200-10 MG TABS TABLET    Take 1 tablet by mouth daily with breakfast.  Modified Medications   Modified Medication Previous Medication   LORATADINE (CLARITIN) 10 MG TABLET loratadine (CLARITIN) 10 MG tablet      Take 1 tablet (10 mg total) by mouth daily.    Take 1 tablet (10 mg total) by mouth daily.   TRIAMCINOLONE CREAM (KENALOG) 0.1 % triamcinolone cream (KENALOG) 0.1 %      Apply 1 application topically 2 (two) times daily.    Apply 1 application topically 2 (two) times daily.  Discontinued Medications   No medications on file    Subjective: William [name] is in for his routine HIV follow-up visit. He continues to take [name] each morning with breakfast. He does not recall missing any doses. He has absolutely no side effects. He is currently not on any other medications but he states that he is still bothered by some slight itching rash on his forearms and he is out of his triamcinolone cream. He would like a refill. He is also requesting a refill of his Claritin for his seasonal rhinitis. He is down to smoking only  2-3 cigarettes daily. He states that he would like to quit completely by December 1. He says that he is feeling much better over the past several years and has been gaining weight.   Review of Systems: Review of Systems  Constitutional: Negative for fever, chills, weight loss, malaise/fatigue and diaphoresis.       He is smiling and in good spirits.  HENT: Negative for sore throat.   Respiratory: Negative for cough, sputum production and shortness of breath.   Cardiovascular: Negative for chest pain.  Gastrointestinal: Negative for nausea, vomiting and diarrhea.  Genitourinary: Negative for dysuria and frequency.  Musculoskeletal: Negative for myalgias and joint pain.  Skin: Negative for rash.  Neurological: Negative for focal weakness.  Psychiatric/Behavioral: Negative for depression and substance abuse. The patient is not nervous/anxious.     Past Medical History  Diagnosis Date  . HIV (human immunodeficiency virus infection)   . Pneumothorax     Social History  Substance Use Topics  . Smoking status: Former Smoker -- 0.25 packs/day for 15 years    Types: Cigarettes  . Smokeless tobacco: Never Used  . Alcohol Use: No    No family history on file.  No Known Allergies  Objective:  Filed Vitals:   12/23/14 0923  Weight: 172 lb 8 oz (78.245 kg)   Body mass index is 24.07 kg/(m^2). His weight is up 18 pounds in the  past 6 months.  Physical Exam  Constitutional: He is oriented to person, place, and time. No distress.  Eyes: Conjunctivae are normal.  Cardiovascular: Normal rate and regular rhythm.   No murmur heard. Pulmonary/Chest: Breath sounds normal.  Abdominal: Soft. He exhibits no mass. There is no tenderness.  Musculoskeletal: Normal range of motion.  Neurological: He is alert and oriented to person, place, and time.  Skin: No rash noted.  Psychiatric: Mood and affect normal.    Lab Results Lab Results  Component Value Date   WBC 4.4 05/23/2014   HGB 14.3  05/23/2014   HCT 43.6 05/23/2014   MCV 87.0 05/23/2014   PLT 188 05/23/2014    Lab Results  Component Value Date   CREATININE 1.00 07/29/2014   BUN 13 07/29/2014   NA 140 07/29/2014   K 3.9 07/29/2014   CL 103 07/29/2014   CO2 25 07/29/2014    Lab Results  Component Value Date   ALT 21 07/29/2014   AST 32 07/29/2014   ALKPHOS 71 07/29/2014   BILITOT 0.6 07/29/2014    Lab Results  Component Value Date   CHOL 152 01/13/2014   HDL 39* 01/13/2014   LDLCALC 102* 01/13/2014   TRIG 55 01/13/2014   CHOLHDL 3.9 01/13/2014    Lab Results HIV 1 RNA QUANT (copies/mL)  Date Value  07/29/2014 <20  01/13/2014 <20  05/07/2013 <20   CD4 T CELL ABS (/uL)  Date Value  07/29/2014 380*  01/13/2014 260*  05/07/2013 360*      Problem List Items Addressed This Visit      High   Human immunodeficiency virus (HIV) disease ([place])    His HIV infection remains under excellent control and he has had steady CD4 reconstitution over the past 4 years. I will continue [name] and repeat lab work today. He will follow-up in 6 months.      Relevant Orders   T-helper cell (CD4)- ([facility] only)   HIV 1 RNA quant-no reflex-bld     Medium   Chronic hepatitis C without hepatic coma ([place])    His hepatitis C has been cured.        Unprioritized   Cigarette smoker - Primary    He is motivated to quit smoking. He was counseled about a cigarette cessation plan. We will give him a nicotine replacement patches.      Relevant Medications   nicotine (NICODERM CQ - DOSED IN MG/24 HOURS) 14 mg/24hr patch   nicotine (NICODERM CQ - DOSED IN MG/24 HR) 7 mg/24hr patch   Seasonal allergies   Relevant Medications   loratadine (CLARITIN) 10 MG tablet        Michel Bickers, MD [facility] for [facility] [place] [facility] [phone] pager   [phone] cell 12/23/2014, 9:58 AM

## 2014-12-24 LAB — T-HELPER CELL (CD4) - (RCID CLINIC ONLY)
CD4 % Helper T Cell: 20 % — ABNORMAL LOW (ref 33–55)
CD4 T Cell Abs: 400 /uL (ref 400–2700)

## 2014-12-25 LAB — HIV-1 RNA QUANT-NO REFLEX-BLD
HIV 1 RNA Quant: <20 copies/mL (ref <20)
HIV-1 RNA Quant, Log: <1.3 Log copies/mL (ref <1.30)

## 2015-02-17 DIAGNOSIS — F250 Schizoaffective disorder, bipolar type: Secondary | ICD-10-CM | POA: Diagnosis not present

## 2015-02-26 DIAGNOSIS — F250 Schizoaffective disorder, bipolar type: Secondary | ICD-10-CM | POA: Diagnosis not present

## 2015-03-02 DIAGNOSIS — F250 Schizoaffective disorder, bipolar type: Secondary | ICD-10-CM | POA: Diagnosis not present

## 2015-05-18 DIAGNOSIS — F250 Schizoaffective disorder, bipolar type: Secondary | ICD-10-CM | POA: Diagnosis not present

## 2015-05-22 DIAGNOSIS — S91302A Unspecified open wound, left foot, initial encounter: Secondary | ICD-10-CM | POA: Diagnosis not present

## 2015-05-22 DIAGNOSIS — S50862A Insect bite (nonvenomous) of left forearm, initial encounter: Secondary | ICD-10-CM | POA: Diagnosis not present

## 2015-05-22 DIAGNOSIS — Z79899 Other long term (current) drug therapy: Secondary | ICD-10-CM | POA: Diagnosis not present

## 2015-05-22 DIAGNOSIS — S80862A Insect bite (nonvenomous), left lower leg, initial encounter: Secondary | ICD-10-CM | POA: Insufficient documentation

## 2015-05-22 DIAGNOSIS — S20469A Insect bite (nonvenomous) of unspecified back wall of thorax, initial encounter: Secondary | ICD-10-CM | POA: Diagnosis not present

## 2015-05-22 DIAGNOSIS — S50861A Insect bite (nonvenomous) of right forearm, initial encounter: Secondary | ICD-10-CM | POA: Insufficient documentation

## 2015-05-22 DIAGNOSIS — F424 Excoriation (skin-picking) disorder: Secondary | ICD-10-CM | POA: Insufficient documentation

## 2015-05-22 DIAGNOSIS — Y998 Other external cause status: Secondary | ICD-10-CM | POA: Insufficient documentation

## 2015-05-22 DIAGNOSIS — Z87891 Personal history of nicotine dependence: Secondary | ICD-10-CM | POA: Insufficient documentation

## 2015-05-22 DIAGNOSIS — Z7952 Long term (current) use of systemic steroids: Secondary | ICD-10-CM | POA: Diagnosis not present

## 2015-05-22 DIAGNOSIS — W57XXXA Bitten or stung by nonvenomous insect and other nonvenomous arthropods, initial encounter: Secondary | ICD-10-CM | POA: Insufficient documentation

## 2015-05-22 DIAGNOSIS — Y9289 Other specified places as the place of occurrence of the external cause: Secondary | ICD-10-CM | POA: Insufficient documentation

## 2015-05-22 DIAGNOSIS — Y9389 Activity, other specified: Secondary | ICD-10-CM | POA: Insufficient documentation

## 2015-05-22 DIAGNOSIS — S80861A Insect bite (nonvenomous), right lower leg, initial encounter: Secondary | ICD-10-CM | POA: Diagnosis not present

## 2015-05-22 DIAGNOSIS — Z8709 Personal history of other diseases of the respiratory system: Secondary | ICD-10-CM | POA: Diagnosis not present

## 2015-05-22 DIAGNOSIS — T148 Other injury of unspecified body region: Secondary | ICD-10-CM | POA: Diagnosis not present

## 2015-05-22 DIAGNOSIS — B20 Human immunodeficiency virus [HIV] disease: Secondary | ICD-10-CM | POA: Insufficient documentation

## 2015-05-23 ENCOUNTER — Emergency Department (HOSPITAL_COMMUNITY): Payer: Medicare Other

## 2015-05-23 ENCOUNTER — Encounter (HOSPITAL_COMMUNITY): Payer: Self-pay | Admitting: Emergency Medicine

## 2015-05-23 ENCOUNTER — Emergency Department (HOSPITAL_COMMUNITY)
Admission: EM | Admit: 2015-05-23 | Discharge: 2015-05-23 | Disposition: A | Discharge status: Home / Self Care | Payer: Medicare Other | Attending: Emergency Medicine | Admitting: Emergency Medicine

## 2015-05-23 DIAGNOSIS — S91302A Unspecified open wound, left foot, initial encounter: Secondary | ICD-10-CM | POA: Diagnosis not present

## 2015-05-23 DIAGNOSIS — S80862A Insect bite (nonvenomous), left lower leg, initial encounter: Secondary | ICD-10-CM | POA: Diagnosis not present

## 2015-05-23 DIAGNOSIS — W57XXXA Bitten or stung by nonvenomous insect and other nonvenomous arthropods, initial encounter: Secondary | ICD-10-CM

## 2015-05-23 DIAGNOSIS — T148XXA Other injury of unspecified body region, initial encounter: Secondary | ICD-10-CM

## 2015-05-23 MED ORDER — CEPHALEXIN 500 MG PO CAPS: 500.0000 mg | ORAL_CAPSULE | Freq: Four times a day (QID) | ORAL | Status: DC

## 2015-05-23 NOTE — ED Notes (Signed)
Pt in X ray

## 2015-05-23 NOTE — ED Provider Notes (Signed)
CSN: [id number]     Arrival date & time 05/22/15  2351 History   First MD Initiated Contact with Patient 05/23/15 0020     Chief Complaint  Patient presents with  . Insect Bite     (Consider location/radiation/quality/duration/timing/severity/associated sxs/prior Treatment) HPI Comments: Patient with a history of HIV presents today with several insect bites to the lower legs, forearms, and back.  He suspect that this is secondary to bed bugs.  He reports that there has been an issue with bedbugs at the place where he was staying and that the home is being condemned.  He is unclear how long the bites have been there, but thinks several months.  He states that he has not actually seen any bugs.  He states that the areas are pruritic.  He has been constantly itching the areas, causing open wounds to the left foot and ankle.  Wound of the left ankle is actively bleeding a small amount at this time.  He has not taken anything for symptoms prior to arrival.  He denies any swelling of the lips, tongue, or throat.  Denies SOB.  No fever or chills.  No nausea or vomiting.  He reports that he is compliant with his HIV medications.  Most recent CD4 count in November 2016 was 400.    The history is provided by the patient.    Past Medical History  Diagnosis Date  . HIV (human immunodeficiency virus infection) ([place])   . Pneumothorax    Past Surgical History  Procedure Laterality Date  . Chest tube insertion    . Video assisted thoracoscopy Left 04/26/2013    Procedure: VIDEO ASSISTED THORACOSCOPY;  Surgeon: Gaye Pollack, MD;  Location: [place];  Service: Thoracic;  Laterality: Left;  . Stapling of blebs Left 04/26/2013    Procedure: STAPLING OF BLEBS;  Surgeon: Gaye Pollack, MD;  Location: [place];  Service: Thoracic;  Laterality: Left;  . Pleuradesis Left 04/26/2013    Procedure: PLEURADESIS;  Surgeon: Gaye Pollack, MD;  Location: [facility];  Service: Thoracic;  Laterality: Left;  Mechanical  . Multiple  extractions with alveoloplasty N/A 05/23/2014    Procedure: EXTRACTIONS OF ALL REMAINING TEETH WITH ALVEOLOPLASTY, REMOVAL OF LEFT OSSEOUS EXOTOSIS, REMOVAL MAXILLARY OSSEOUS TUBEROSITY;  Surgeon: Diona Browner, DDS;  Location: [place];  Service: Oral Surgery;  Laterality: N/A;   No family history on file. Social History  Substance Use Topics  . Smoking status: Former Smoker -- 0.25 packs/day for 0 years    Types: Cigarettes  . Smokeless tobacco: Never Used  . Alcohol Use: No    Review of Systems  All other systems reviewed and are negative.     Allergies  Review of patient's allergies indicates no known allergies.  Home Medications   Prior to Admission medications   Medication Sig Start Date End Date Taking? Authorizing Provider  elvitegravir-cobicistat-emtricitabine-tenofovir ([name]) 150-150-200-10 MG TABS tablet Take 1 tablet by mouth daily with breakfast. 06/10/14   Michel Bickers, MD  nicotine (NICODERM CQ - DOSED IN MG/24 HOURS) 14 mg/24hr patch Place 1 patch (14 mg total) onto the skin daily. 12/23/14   Michel Bickers, MD  nicotine (NICODERM CQ - DOSED IN MG/24 HR) 7 mg/24hr patch Place 1 patch (7 mg total) onto the skin daily. 12/23/14   Michel Bickers, MD  triamcinolone cream (KENALOG) 0.1 % Apply 1 application topically 2 (two) times daily. 12/23/14   Michel Bickers, MD   BP 109/72 mmHg  Pulse 94  Temp(Src) 98.3 F (36.8 C) (Oral)  Resp 14  SpO2 99% Physical Exam  Constitutional: He appears well-developed and well-nourished.  HENT:  Head: Normocephalic and atraumatic.  Mouth/Throat: Oropharynx is clear and moist.  Neck: Normal range of motion. Neck supple.  Cardiovascular: Normal rate, regular rhythm and normal heart sounds.   Pulmonary/Chest: Effort normal and breath sounds normal.  Musculoskeletal: Normal range of motion.  Neurological: He is alert.  Skin: Skin is warm and dry.  Patient with diffuse hyperpigmented pruritic papules on the lower legs, forearms, and  upper back.  No lesions on the web spaces of the bands.  Excoriations of the lower legs bilaterally.  Excoriated bleeding area of the left lower leg and also the left foot with small wounds that appear to be chronic.  Small insects crawling on the patient's socks.    Psychiatric: He has a normal mood and affect.  Nursing note and vitals reviewed.   ED Course  Procedures (including critical care time) Labs Review Labs Reviewed - No data to display  Imaging Review No results found. I have personally reviewed and evaluated these images and lab results as part of my medical decision-making.   EKG Interpretation None      MDM   Final diagnoses:  None   Patient presents today with what he believes to be bed bug bites on the lower legs and forearms.  He does have pruritic papules that are excoriated from itching.  He also has a small open area of his lower leg that is actively bleeding that he has been picking it.  Patient is afebrile and non toxic appearing.  No signs of anaphylaxis.  Concern for possible developing infection of the left lower leg in the area of the open wound.  Therefore, patient started on antibiotics.  Patient instructed to take Benadryl for the itching.  Patient also given instructions on getting rid of the bedbugs and washing his clothes, etc.  Feel that the patient is stable for discharge.  Return precautions given.      Hyman Bible, PA-C 05/25/15 1421  Everlene Balls, MD 05/28/15 1148

## 2015-05-23 NOTE — ED Notes (Addendum)
Pt. arrived with EMS from home with multiple chronic itchy bed bug bites at lower legs /arms and torso for several months , left ankle skin excoriations with bleeding .

## 2015-05-23 NOTE — ED Notes (Signed)
Patient verbalized understanding of discharge instructions and denies any further needs or questions at this time. VS stable. Patient ambulatory with steady gait. Assisted to ED entrance in wheelchair.   

## 2015-05-23 NOTE — Discharge Instructions (Signed)
You can take Benadryl for itching.  Do not pick at the insect bites or the open wound.  Return to the Emergency Department if you develop fever above 101 F, thick drainage from the wound, increased redness, swelling, or warmth.

## 2015-05-25 ENCOUNTER — Other Ambulatory Visit: Payer: Self-pay | Admitting: *Deleted

## 2015-05-25 ENCOUNTER — Other Ambulatory Visit: Payer: Medicare Other

## 2015-05-25 DIAGNOSIS — B20 Human immunodeficiency virus [HIV] disease: Secondary | ICD-10-CM

## 2015-05-26 LAB — HIV-1 RNA QUANT-NO REFLEX-BLD
HIV 1 RNA Quant: 165 copies/mL — ABNORMAL HIGH (ref <20)
HIV-1 RNA Quant, Log: 2.22 Log copies/mL — ABNORMAL HIGH (ref <1.30)

## 2015-05-26 LAB — T-HELPER CELL (CD4) - (RCID CLINIC ONLY)
CD4 % Helper T Cell: 14 % — ABNORMAL LOW (ref 33–55)
CD4 T Cell Abs: 340 /uL — ABNORMAL LOW (ref 400–2700)

## 2015-06-02 ENCOUNTER — Encounter: Payer: Self-pay | Admitting: Internal Medicine

## 2015-06-02 ENCOUNTER — Ambulatory Visit (INDEPENDENT_AMBULATORY_CARE_PROVIDER_SITE_OTHER): Payer: Medicare Other | Admitting: Internal Medicine

## 2015-06-02 VITALS — BP 122/78 | HR 74 | Temp 97.5°F | Wt 177.0 lb

## 2015-06-02 DIAGNOSIS — L739 Follicular disorder, unspecified: Secondary | ICD-10-CM | POA: Insufficient documentation

## 2015-06-02 DIAGNOSIS — J302 Other seasonal allergic rhinitis: Secondary | ICD-10-CM

## 2015-06-02 DIAGNOSIS — Z720 Tobacco use: Secondary | ICD-10-CM | POA: Diagnosis not present

## 2015-06-02 DIAGNOSIS — B20 Human immunodeficiency virus [HIV] disease: Secondary | ICD-10-CM

## 2015-06-02 DIAGNOSIS — F17210 Nicotine dependence, cigarettes, uncomplicated: Secondary | ICD-10-CM

## 2015-06-02 HISTORY — DX: Follicular disorder, unspecified: L73.9

## 2015-06-02 MED ORDER — NICOTINE 14 MG/24HR TD PT24: 14.0000 mg | MEDICATED_PATCH | Freq: Every day | TRANSDERMAL | Status: DC

## 2015-06-02 MED ORDER — NICOTINE 7 MG/24HR TD PT24: 7.0000 mg | MEDICATED_PATCH | Freq: Every day | TRANSDERMAL | Status: DC

## 2015-06-02 MED ORDER — TRIAMCINOLONE ACETONIDE 0.1 % EX CREA: 1.0000 "application " | TOPICAL_CREAM | Freq: Two times a day (BID) | CUTANEOUS | Status: DC

## 2015-06-02 MED ORDER — LORATADINE 10 MG PO TABS: 10.0000 mg | ORAL_TABLET | Freq: Every day | ORAL | Status: DC

## 2015-06-02 NOTE — Progress Notes (Signed)
Patient Active Problem List   Diagnosis Date Noted  . Human immunodeficiency virus (HIV) disease ([place]) 07/10/2006    Priority: High  . Chronic hepatitis C without hepatic coma ([place]) 07/10/2006    Priority: Medium  . Folliculitis [id number]  . Cigarette smoker 12/23/2014  . Spontaneous pneumothorax 04/24/2013  . Seasonal allergies 01/22/2013  . Dental abscess 11/24/2011  . OTHER NONSPECIFIC ABNORMAL SERUM ENZYME LEVELS 02/16/2010  . PNEUMOCYSTIS PNEUMONIA 07/10/2006  . LOW BACK PAIN 07/10/2006  . ALCOHOL ABUSE, HX OF 07/10/2006    Patient's Medications  New Prescriptions   LORATADINE (CLARITIN) 10 MG TABLET    Take 1 tablet (10 mg total) by mouth daily.  Previous Medications   ELVITEGRAVIR-COBICISTAT-EMTRICITABINE-TENOFOVIR ([name]) 150-150-200-10 MG TABS TABLET    Take 1 tablet by mouth daily with breakfast.  Modified Medications   Modified Medication Previous Medication   NICOTINE (NICODERM CQ - DOSED IN MG/24 HOURS) 14 MG/24HR PATCH nicotine (NICODERM CQ - DOSED IN MG/24 HOURS) 14 mg/24hr patch      Place 1 patch (14 mg total) onto the skin daily.    Place 1 patch (14 mg total) onto the skin daily.   NICOTINE (NICODERM CQ - DOSED IN MG/24 HR) 7 MG/24HR PATCH nicotine (NICODERM CQ - DOSED IN MG/24 HR) 7 mg/24hr patch      Place 1 patch (7 mg total) onto the skin daily.    Place 1 patch (7 mg total) onto the skin daily.   TRIAMCINOLONE CREAM (KENALOG) 0.1 % triamcinolone cream (KENALOG) 0.1 %      Apply 1 application topically 2 (two) times daily.    Apply 1 application topically 2 (two) times daily.  Discontinued Medications   CEPHALEXIN (KEFLEX) 500 MG CAPSULE    Take 1 capsule (500 mg total) by mouth 4 (four) times daily.   LORATADINE (CLARITIN) 10 MG TABLET    Take 1 tablet (10 mg total) by mouth daily.    Subjective: Bash is in for his routine HIV follow-up visit. He had some problem with his insurance and co-pay last month and was off of his [name] for  2-1/2 weeks. He had been off when he came in for lab work on 05/25/2015. He is back on his medication now. He has not missed any doses when he had his medication. For the past 2 weeks been bothered by a pruritic rash. He went to the emergency department and was given cephalexin. He took that along with diphenhydramine and his rash has gotten slightly better. He continues to smoke cigarettes but wants to quit.   Review of Systems: Review of Systems  Constitutional: Positive for weight loss. Negative for fever, chills, malaise/fatigue and diaphoresis.  HENT: Negative for sore throat.   Respiratory: Negative for cough, sputum production and shortness of breath.   Cardiovascular: Negative for chest pain.  Gastrointestinal: Negative for nausea, vomiting, abdominal pain and diarrhea.  Genitourinary: Negative for dysuria.  Musculoskeletal: Negative for myalgias and joint pain.  Skin: Positive for itching and rash.  Neurological: Negative for dizziness and headaches.  Psychiatric/Behavioral: Negative for depression and substance abuse. The patient is not nervous/anxious.     Past Medical History  Diagnosis Date  . HIV (human immunodeficiency virus infection) ([place])   . Pneumothorax     Social History  Substance Use Topics  . Smoking status: Former Smoker -- 0.25 packs/day for 0 years    Types: Cigarettes  . Smokeless tobacco: Never Used  .  Alcohol Use: No    No family history on file.  No Known Allergies  Objective:  Filed Vitals:   06/02/15 0917  BP: 122/78  Pulse: 74  Temp: 97.5 F (36.4 C)  TempSrc: Oral  Weight: 177 lb (80.287 kg)   Body mass index is 24.7 kg/(m^2).  Physical Exam  Constitutional: He is oriented to person, place, and time.  He is in no distress talking on his cell phone.  HENT:  Mouth/Throat: No oropharyngeal exudate.  Eyes: Conjunctivae are normal.  Cardiovascular: Normal rate and regular rhythm.   No murmur heard. Pulmonary/Chest: Breath sounds  normal.  Abdominal: Soft. He exhibits no mass. There is no tenderness.  Musculoskeletal: Normal range of motion.  Neurological: He is alert and oriented to person, place, and time.  Skin: No rash noted.  Diffuse, follicular rash.  Psychiatric: Mood and affect normal.    Lab Results Lab Results  Component Value Date   WBC 5.6 12/23/2014   HGB 13.6 12/23/2014   HCT 41.0 12/23/2014   MCV 86.9 12/23/2014   PLT 231 12/23/2014    Lab Results  Component Value Date   CREATININE 0.88 12/23/2014   BUN 14 12/23/2014   NA 137 12/23/2014   K 3.9 12/23/2014   CL 105 12/23/2014   CO2 23 12/23/2014    Lab Results  Component Value Date   ALT 21 12/23/2014   AST 29 12/23/2014   ALKPHOS 56 12/23/2014   BILITOT 0.4 12/23/2014    Lab Results  Component Value Date   CHOL 185 12/23/2014   HDL 61 12/23/2014   LDLCALC 105 12/23/2014   TRIG 96 12/23/2014   CHOLHDL 3.0 12/23/2014    Lab Results HIV 1 RNA QUANT (copies/mL)  Date Value  05/25/2015 165*  12/23/2014 <20  07/29/2014 <20   CD4 T CELL ABS (/uL)  Date Value  05/25/2015 340*  12/23/2014 400  07/29/2014 380*      Problem List Items Addressed This Visit      High   Human immunodeficiency virus (HIV) disease ([place])    His viral load has reactivated to low levels after missing his medication for 2 and half weeks. He assures me that this will not happen again. He will follow-up after lab work in 3 months.      Relevant Orders   T-helper cell (CD4)- ([facility] clinic only)   HIV 1 RNA quant-no reflex-bld   CBC   Comprehensive metabolic panel     Unprioritized   Cigarette smoker    He is motivated to quit smoking cigarettes. I asked him to continue to try to cut down and set a quit date within the next month. He will restart nicotine patches.      Relevant Medications   nicotine (NICODERM CQ - DOSED IN MG/24 HR) 7 mg/24hr patch   nicotine (NICODERM CQ - DOSED IN MG/24 HOURS) 14 0000000 patch   Folliculitis - Primary     He has recurrent folliculitis. He has responded to topical steroids in the past and I will try that again now.      Seasonal allergies    I asked him to restart his loratadine.      Relevant Medications   loratadine (CLARITIN) 10 MG tablet        Michel Bickers, MD [facility] [place] [phone] pager   [phone] cell 06/02/2015, 9:39 AM

## 2015-06-02 NOTE — Assessment & Plan Note (Signed)
His viral load has reactivated to low levels after missing his medication for 2 and half weeks. He assures me that this will not happen again. He will follow-up after lab work in 3 months.

## 2015-06-02 NOTE — Assessment & Plan Note (Signed)
He has recurrent folliculitis. He has responded to topical steroids in the past and I will try that again now.

## 2015-06-02 NOTE — Assessment & Plan Note (Signed)
I asked him to restart his loratadine.

## 2015-06-02 NOTE — Assessment & Plan Note (Signed)
He is motivated to quit smoking cigarettes. I asked him to continue to try to cut down and set a quit date within the next month. He will restart nicotine patches.

## 2015-06-09 ENCOUNTER — Other Ambulatory Visit: Payer: Self-pay | Admitting: Internal Medicine

## 2015-06-23 ENCOUNTER — Ambulatory Visit: Payer: Medicare Other | Admitting: Internal Medicine

## 2015-08-24 ENCOUNTER — Ambulatory Visit: Payer: Medicare Other | Admitting: Internal Medicine

## 2015-09-01 ENCOUNTER — Ambulatory Visit: Payer: Medicare Other | Admitting: Internal Medicine

## 2015-09-02 IMAGING — US US ABDOMEN COMPLETE W/ ELASTOGRAPHY
1 series · 13 of 25 positions shown · non-contrast
Comparison: None.

CLINICAL DATA: Chronic hepatitis C



[Series 1: us abdomen complete w/ elastography · 0.17mm/px · 13 of 81 slices shown]
[im 1/81]
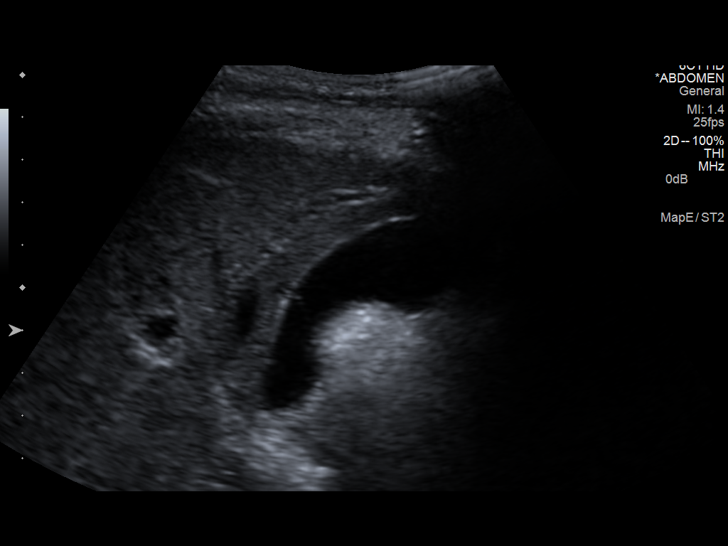
[im 7/81]
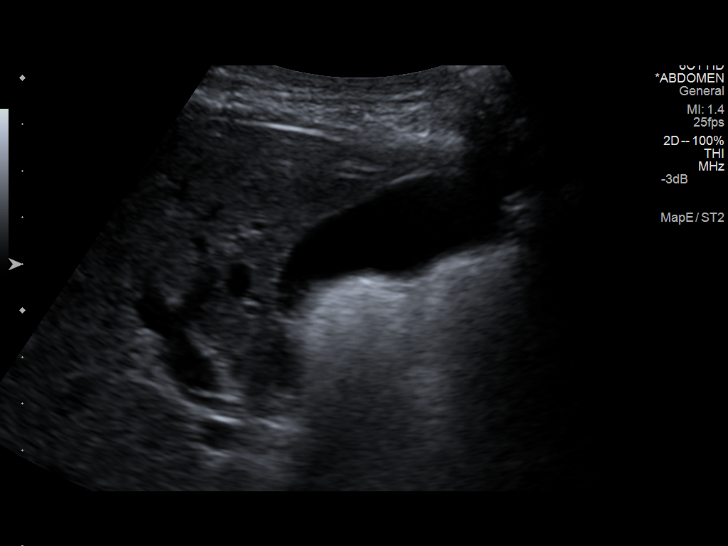
[im 14/81]
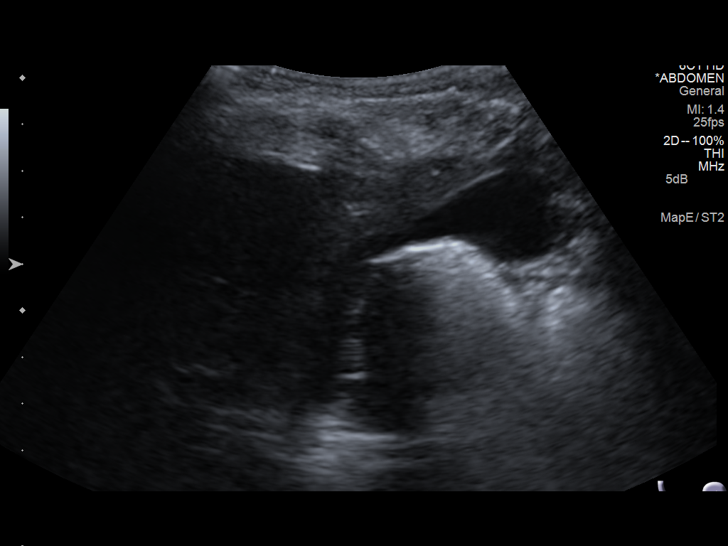
[im 21/81]
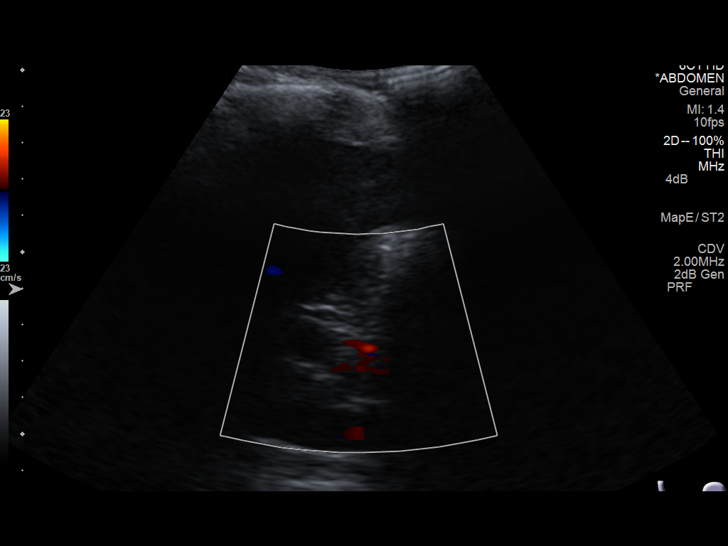
[im 27/81]
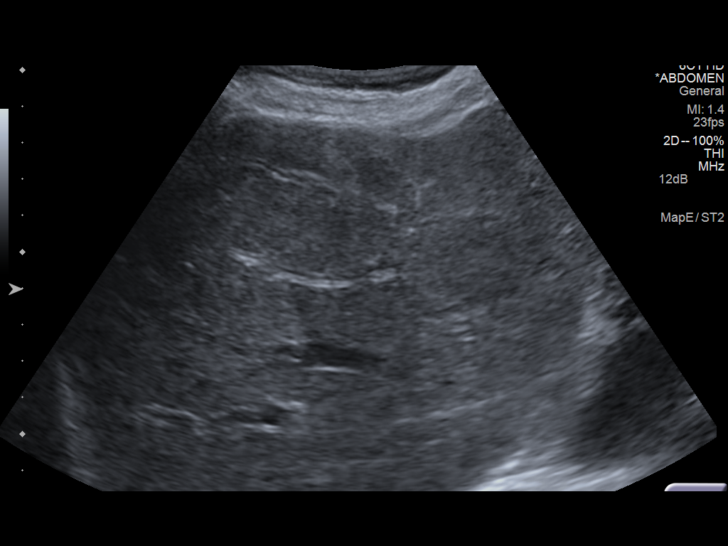
[im 34/81]
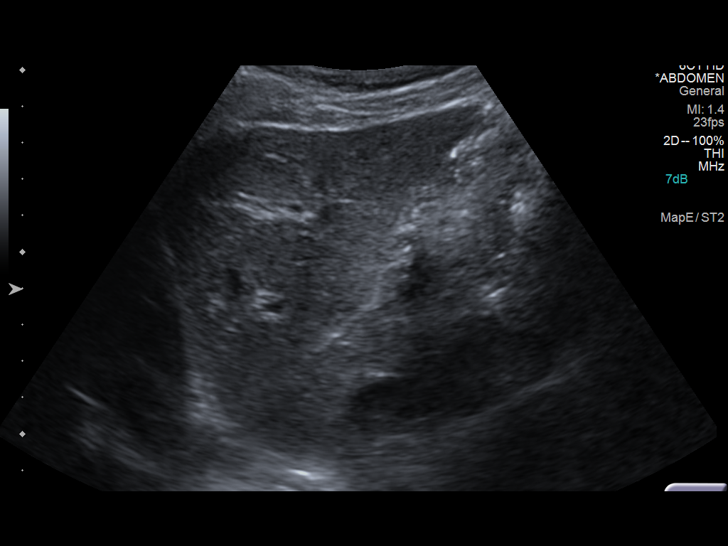
[im 41/81]
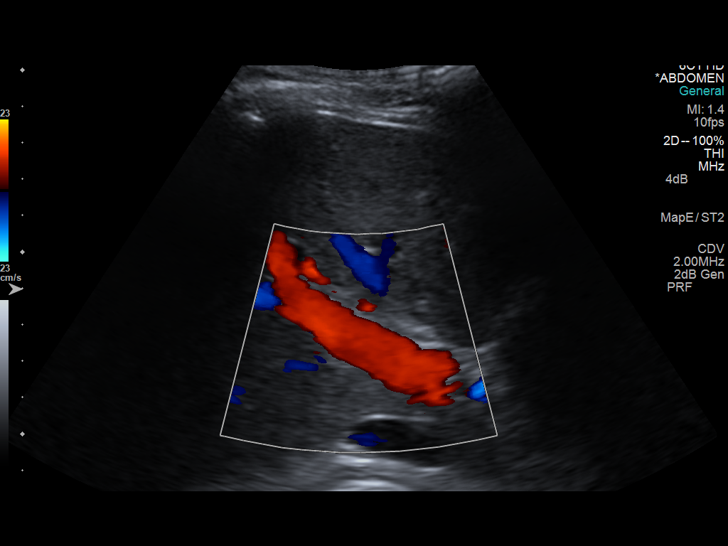
[im 47/81]
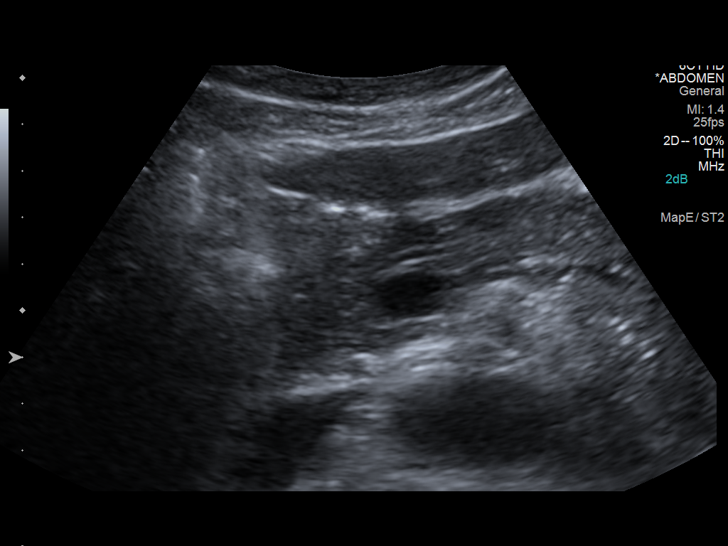
[im 54/81]
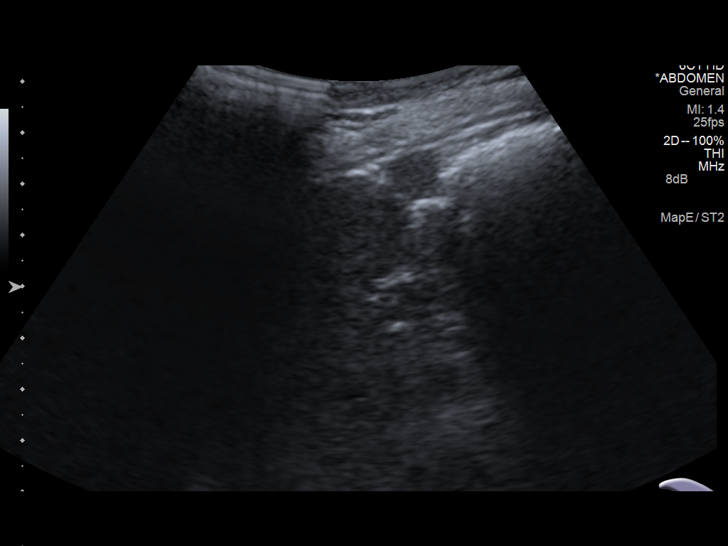
[im 61/81]
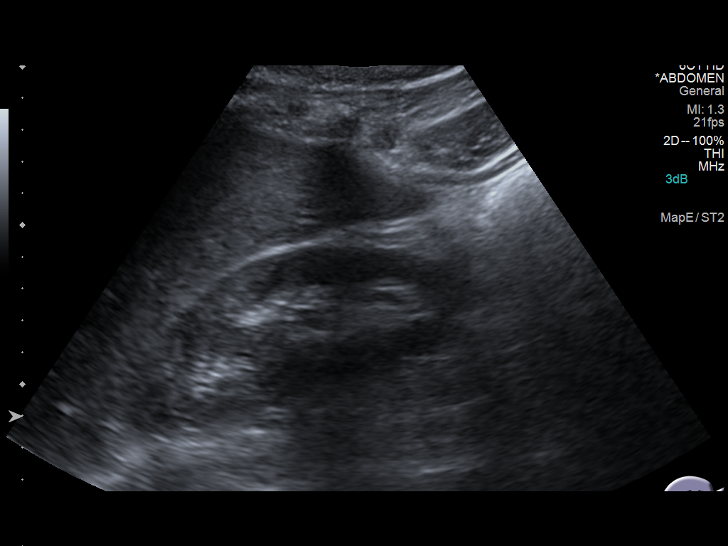
[im 67/81]
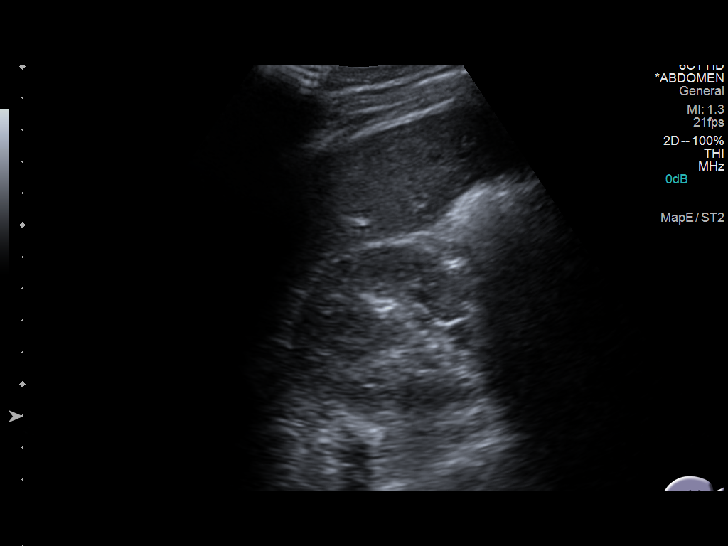
[im 74/81]
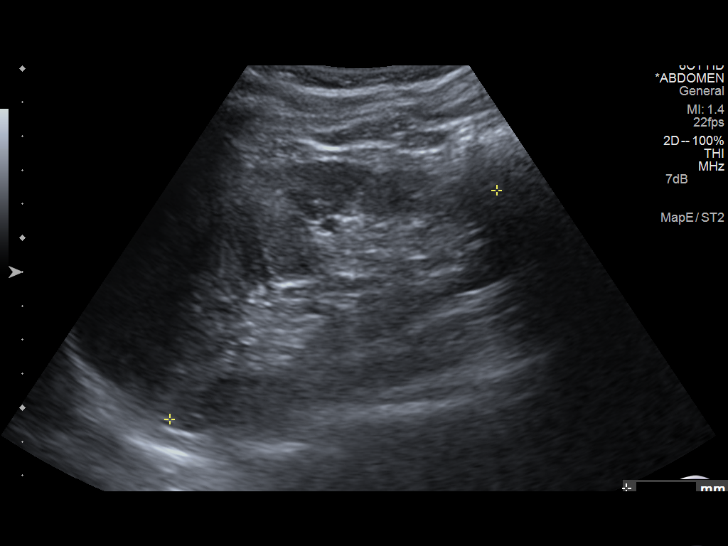
[im 81/81]
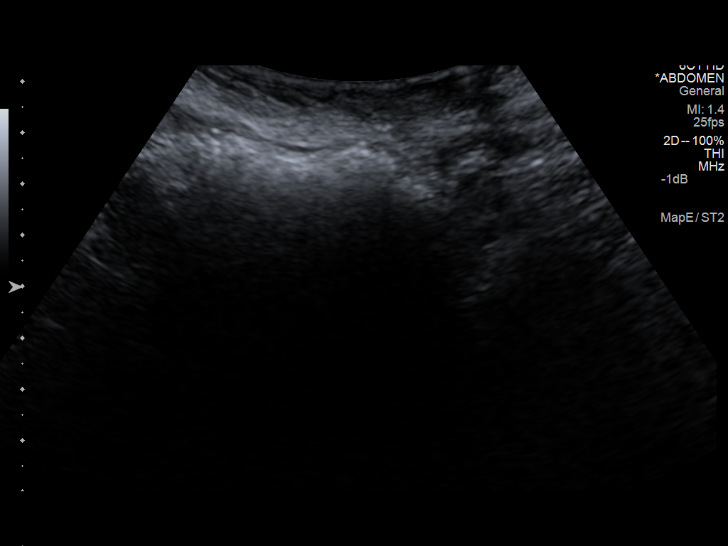

[13 of 25 positions shown; findings below may reference images not displayed]

FINDINGS: ULTRASOUND ABDOMEN

Gallbladder: No gallstones, gallbladder wall thickening, or
pericholecystic fluid. Negative sonographic Murphy's sign.

Common bile duct: Diameter: 4 mm

Liver: No focal lesion identified. Within normal limits in
parenchymal echogenicity.

IVC: No abnormality visualized.

Pancreas: Visualized portion unremarkable.

Spleen: Size and appearance within normal limits. Accessory
splenule.

Right Kidney: Length: 11.1 cm. No mass or hydronephrosis.

Left Kidney: Length: 11.8 cm. No mass or hydronephrosis.

Abdominal aorta: No aneurysm visualized.

Other findings: None.

ULTRASOUND HEPATIC ELASTOGRAPHY

Device: Siemens Helix VTQ

Transducer 4V1

Patient position: Left lateral decubitus

Number of measurements:  10

Hepatic Segment:  8

Median velocity:   1.00  m/sec

IQR:

IQR/Median velocity ratio

Corresponding Metavir fibrosis score:  F0/F1

Risk of fibrosis: Minimal

Limitations of exam: None

Pertinent findings noted on other imaging exams:  None

Please note that abnormal shear wave velocities may also be
identified in clinical settings other than with hepatic fibrosis,
such as: acute hepatitis, elevated right heart and central venous
pressures including use of beta blockers, Konadu disease
([place]), infiltrative processes such as
mastocytosis/amyloidosis/infiltrative tumor, extrahepatic
cholestasis, in the post-prandial state, and liver transplantation.
Correlation with patient history, laboratory data, and clinical
condition recommended.
IMPRESSION: Unremarkable abdominal ultrasound

Median hepatic shear wave velocity is calculated at 1.00 m/sec.

Corresponding Metavir fibrosis score is F0/F1.

Risk of fibrosis is minimal.

Follow-up:  None required.

## 2015-09-15 ENCOUNTER — Encounter: Payer: Self-pay | Admitting: Internal Medicine

## 2015-09-15 ENCOUNTER — Ambulatory Visit (INDEPENDENT_AMBULATORY_CARE_PROVIDER_SITE_OTHER): Payer: Medicare Other | Admitting: Internal Medicine

## 2015-09-15 DIAGNOSIS — J302 Other seasonal allergic rhinitis: Secondary | ICD-10-CM

## 2015-09-15 DIAGNOSIS — Z720 Tobacco use: Secondary | ICD-10-CM | POA: Diagnosis not present

## 2015-09-15 DIAGNOSIS — B20 Human immunodeficiency virus [HIV] disease: Secondary | ICD-10-CM

## 2015-09-15 DIAGNOSIS — F17210 Nicotine dependence, cigarettes, uncomplicated: Secondary | ICD-10-CM

## 2015-09-15 MED ORDER — NICOTINE 7 MG/24HR TD PT24: 7.0000 mg | MEDICATED_PATCH | Freq: Every day | TRANSDERMAL | 1 refills | Status: DC

## 2015-09-15 NOTE — Progress Notes (Signed)
Patient Active Problem List   Diagnosis Date Noted  . Human immunodeficiency virus (HIV) disease ([place]) 07/10/2006    Priority: High  . Chronic hepatitis C without hepatic coma ([place]) 07/10/2006    Priority: Medium  . Folliculitis [id number]  . Cigarette smoker 12/23/2014  . Spontaneous pneumothorax 04/24/2013  . Seasonal allergies 01/22/2013  . Dental abscess 11/24/2011  . OTHER NONSPECIFIC ABNORMAL SERUM ENZYME LEVELS 02/16/2010  . PNEUMOCYSTIS PNEUMONIA 07/10/2006  . LOW BACK PAIN 07/10/2006  . ALCOHOL ABUSE, HX OF 07/10/2006    Patient's Medications  New Prescriptions   No medications on file  Previous Medications   [name] 150-150-200-10 MG TABS TABLET    TAKE 1 TABLET BY MOUTH ONCE DAILY WITH BREAKFAST   LORATADINE (CLARITIN) 10 MG TABLET    Take 1 tablet (10 mg total) by mouth daily.   NICOTINE (NICODERM CQ - DOSED IN MG/24 HOURS) 14 MG/24HR PATCH    Place 1 patch (14 mg total) onto the skin daily.   TRIAMCINOLONE CREAM (KENALOG) 0.1 %    Apply 1 application topically 2 (two) times daily.  Modified Medications   Modified Medication Previous Medication   NICOTINE (NICODERM CQ - DOSED IN MG/24 HR) 7 MG/24HR PATCH nicotine (NICODERM CQ - DOSED IN MG/24 HR) 7 mg/24hr patch      Place 1 patch (7 mg total) onto the skin daily.    Place 1 patch (7 mg total) onto the skin daily.  Discontinued Medications   No medications on file    Subjective: William [name] is in for his routine HIV follow-up visit. He denies missing any doses of his [name] since his last visit. He states that he had his last cigarette last week. He continues to use the nicotine patches and plans to quit completely. He states that he is having trouble trimming his thickened toenails and would like to be referred to see a podiatrist.   Review of Systems: Review of Systems  Constitutional: Negative for chills, diaphoresis, fever, malaise/fatigue and weight loss.  HENT: Negative for sore throat.     Respiratory: Negative for cough, sputum production and shortness of breath.   Cardiovascular: Negative for chest pain.  Gastrointestinal: Negative for abdominal pain, diarrhea, nausea and vomiting.  Genitourinary: Negative for dysuria and frequency.  Musculoskeletal: Negative for joint pain and myalgias.  Skin: Negative for rash.  Neurological: Negative for dizziness and headaches.  Psychiatric/Behavioral: Negative for depression and substance abuse. The patient is not nervous/anxious.     Past Medical History:  Diagnosis Date  . HIV (human immunodeficiency virus infection) ([place])   . Pneumothorax     Social History  Substance Use Topics  . Smoking status: Former Smoker    Packs/day: 0.25    Years: 0.00    Types: Cigarettes  . Smokeless tobacco: Never Used  . Alcohol use No    No family history on file.  No Known Allergies  Objective:  Vitals:   09/15/15 1126  BP: 125/78  Temp: 97.1 F (36.2 C)  TempSrc: Oral  Weight: 178 lb (80.7 kg)   Body mass index is 24.83 kg/m.  Physical Exam  Constitutional: He is oriented to person, place, and time.  He is in very good spirits today.  HENT:  Mouth/Throat: No oropharyngeal exudate.  Eyes: Conjunctivae are normal.  Cardiovascular: Normal rate and regular rhythm.   No murmur heard. Pulmonary/Chest: Breath sounds normal.  Abdominal: Soft. He exhibits no mass. There is no tenderness.  Musculoskeletal: Normal range of motion.  Neurological: He is alert and oriented to person, place, and time.  Skin: No rash noted.  He has dark, thickened, long toenails.  Psychiatric: Mood and affect normal.    Lab Results Lab Results  Component Value Date   WBC 5.6 12/23/2014   HGB 13.6 12/23/2014   HCT 41.0 12/23/2014   MCV 86.9 12/23/2014   PLT 231 12/23/2014    Lab Results  Component Value Date   CREATININE 0.88 12/23/2014   BUN 14 12/23/2014   NA 137 12/23/2014   K 3.9 12/23/2014   CL 105 12/23/2014   CO2 23  12/23/2014    Lab Results  Component Value Date   ALT 21 12/23/2014   AST 29 12/23/2014   ALKPHOS 56 12/23/2014   BILITOT 0.4 12/23/2014    Lab Results  Component Value Date   CHOL 185 12/23/2014   HDL 61 12/23/2014   LDLCALC 105 12/23/2014   TRIG 96 12/23/2014   CHOLHDL 3.0 12/23/2014   HIV 1 RNA Quant (copies/mL)  Date Value  05/25/2015 165 (H)  12/23/2014 <20  07/29/2014 <20   CD4 T Cell Abs (/uL)  Date Value  05/25/2015 340 (L)  12/23/2014 400  07/29/2014 380 (L)     Problem List Items Addressed This Visit      High   Human immunodeficiency virus (HIV) disease ([place])    It sounds like his adherence is very good. His viral load was up some 4 months ago. I will repeat lab work today and see him back in 6 months.      Relevant Orders   T-helper cell (CD4)- ([facility] clinic only)   HIV 1 RNA quant-no reflex-bld   HIV 1 RNA quant-no reflex-bld     Unprioritized   Cigarette smoker    I gave him another refill of his nicotine patches. He will use them for 1 more month      Relevant Medications   nicotine (NICODERM CQ - DOSED IN MG/24 HR) 7 mg/24hr patch   Seasonal allergies    He feels like the Claritin is helping.       Other Visit Diagnoses   None.       Michel Bickers, MD [facility] [phone] pager   [phone] cell 09/15/2015, 11:57 AM

## 2015-09-15 NOTE — Assessment & Plan Note (Signed)
I gave him another refill of his nicotine patches. He will use them for 1 more month

## 2015-09-15 NOTE — Assessment & Plan Note (Signed)
It sounds like his adherence is very good. His viral load was up some 4 months ago. I will repeat lab work today and see him back in 6 months.

## 2015-09-15 NOTE — Assessment & Plan Note (Signed)
He feels like the Claritin is helping.

## 2015-09-16 LAB — HIV-1 RNA QUANT-NO REFLEX-BLD
HIV 1 RNA Quant: <20 copies/mL (ref <20)
HIV-1 RNA Quant, Log: <1.3 Log copies/mL (ref <1.30)

## 2015-09-16 LAB — T-HELPER CELL (CD4) - (RCID CLINIC ONLY)
CD4 % Helper T Cell: 17 % — ABNORMAL LOW (ref 33–55)
CD4 T Cell Abs: 320 /uL — ABNORMAL LOW (ref 400–2700)

## 2015-09-24 ENCOUNTER — Encounter (INDEPENDENT_AMBULATORY_CARE_PROVIDER_SITE_OTHER): Payer: Medicare Other | Admitting: *Deleted

## 2015-09-24 VITALS — BP 120/83 | HR 57 | Temp 97.9°F | Ht 72.0 in | Wt 177.0 lb

## 2015-09-24 DIAGNOSIS — E785 Hyperlipidemia, unspecified: Secondary | ICD-10-CM

## 2015-09-24 DIAGNOSIS — Z006 Encounter for examination for normal comparison and control in clinical research program: Secondary | ICD-10-CM

## 2015-09-24 DIAGNOSIS — D509 Iron deficiency anemia, unspecified: Secondary | ICD-10-CM | POA: Diagnosis not present

## 2015-09-24 LAB — COMPREHENSIVE METABOLIC PANEL
ALT: 11 U/L (ref 9–46)
AST: 17 U/L (ref 10–35)
Albumin: 4.2 g/dL (ref 3.6–5.1)
Alkaline Phosphatase: 57 U/L (ref 40–115)
BUN: 12 mg/dL (ref 7–25)
CO2: 24 mmol/L (ref 20–31)
Calcium: 9.4 mg/dL (ref 8.6–10.3)
Chloride: 105 mmol/L (ref 98–110)
Creat: 0.82 mg/dL (ref 0.70–1.33)
Glucose, Bld: 82 mg/dL (ref 65–99)
Potassium: 4.6 mmol/L (ref 3.5–5.3)
Sodium: 139 mmol/L (ref 135–146)
Total Bilirubin: 0.3 mg/dL (ref 0.2–1.2)
Total Protein: 7.7 g/dL (ref 6.1–8.1)

## 2015-09-24 LAB — CBC WITH DIFFERENTIAL/PLATELET
Basophils Absolute: 47 cells/uL (ref 0–200)
Basophils Relative: 1 %
Eosinophils Absolute: 329 cells/uL (ref 15–500)
Eosinophils Relative: 7 %
HCT: 42.6 % (ref 38.5–50.0)
Hemoglobin: 14.3 g/dL (ref 13.2–17.1)
Lymphocytes Relative: 37 %
Lymphs Abs: 1739 cells/uL (ref 850–3900)
MCH: 29.5 pg (ref 27.0–33.0)
MCHC: 33.6 g/dL (ref 32.0–36.0)
MCV: 87.8 fL (ref 80.0–100.0)
MPV: 9 fL (ref 7.5–12.5)
Monocytes Absolute: 564 cells/uL (ref 200–950)
Monocytes Relative: 12 %
Neutro Abs: 2021 cells/uL (ref 1500–7800)
Neutrophils Relative %: 43 %
Platelets: 228 10*3/uL (ref 140–400)
RBC: 4.85 MIL/uL (ref 4.20–5.80)
RDW: 14.7 % (ref 11.0–15.0)
WBC: 4.7 10*3/uL (ref 3.8–10.8)

## 2015-09-24 LAB — LIPID PANEL
Cholesterol: 178 mg/dL (ref 125–200)
HDL: 69 mg/dL (ref >=40)
LDL Cholesterol: 98 mg/dL (ref <130)
Total CHOL/HDL Ratio: 2.6 Ratio (ref <=5.0)
Triglycerides: 57 mg/dL (ref <150)
VLDL: 11 mg/dL (ref <30)

## 2015-09-24 NOTE — Progress Notes (Signed)
   Subjective:    Patient ID: William [name], male    DOB: 04-Dec-1960, 55 y.o.   MRN: [id number]  HPI    Review of Systems  All other systems reviewed and are negative.      Objective:   Physical Exam  Constitutional: He is oriented to person, place, and time.  HENT:  Mouth/Throat: Oropharynx is clear and moist.  Eyes: No scleral icterus.  Neck: Normal range of motion.  Cardiovascular: Normal rate, regular rhythm, normal heart sounds and intact distal pulses.   Pulmonary/Chest: Effort normal and breath sounds normal.  Abdominal: Soft. Bowel sounds are normal.  Musculoskeletal: Normal range of motion. He exhibits no edema.  Lymphadenopathy:    He has no cervical adenopathy.  Neurological: He is alert and oriented to person, place, and time.  Skin: Rash noted.  Psychiatric: He has a normal mood and affect.          Assessment & Plan:  William [name] is here to screen for the Reprieve Study, A Randomized Trial to Prevent Vascular Events in HIV (study drug is Pitavastatin 4mg  or placebo) Informed consent was obtained after he had a chance to review the consent and we went over it together in detail. He was able to verbalize the purpose of the study back to me and his responsibilities as a Estate manager/land agent. He denies any past history of heart disease, or hypertension. He does have a history of Hep C which was successfully treated in 2016. He also has a history of PCP Pneumonia and spontaneous pneumothorax x 2 for which he had a Pleurectomy. He was originally diagnosed in 2003 and has been on and off meds since then, but past few years  has been very compliant with his ARVs and keeping appointments. He has healing rashes to his arms and legs for which he uses tramcinolone cream for. Besides his [name], the only other meds he takes are claritin daily for seasonal allergies and a nicoderm patch while he is trying to quit smoking. He says he still smokes about 2 cigarettes a day.We plan on  enrolling him in 2 weeks if he is determined to be eligible.

## 2015-10-08 ENCOUNTER — Encounter (INDEPENDENT_AMBULATORY_CARE_PROVIDER_SITE_OTHER): Payer: Medicare Other | Admitting: *Deleted

## 2015-10-08 VITALS — BP 122/67 | HR 93 | Temp 97.5°F | Wt 170.5 lb

## 2015-10-08 DIAGNOSIS — Z006 Encounter for examination for normal comparison and control in clinical research program: Secondary | ICD-10-CM

## 2015-10-08 NOTE — Progress Notes (Signed)
William [name] enrolled on the Reprieve, A Randomized Trial to Prevent Vascular Events in HIV (study drug is Pitavastatin 4mg  or placebo) today after his eligibility was verified. He said he started having nausea, diarrhea and dizziness yesterday evening  And still felt somewhat bad but wanted to continue the study visit today anyway. I did offer him to come back when he felt better. He denied any other complaints. We reviewed the purpose of the study, dosing of the pitavastatin/placebo and what side effects to watch out for. I told him he could wait up to 3 days to start the medication if he still felt sick on his stomach. He also enrolled on the substudy A5361 and was able to perform the frailty testing. He will return in 1 month for followup of the study.

## 2015-10-26 ENCOUNTER — Ambulatory Visit (INDEPENDENT_AMBULATORY_CARE_PROVIDER_SITE_OTHER): Payer: Medicare Other | Admitting: Podiatry

## 2015-10-26 ENCOUNTER — Encounter: Payer: Self-pay | Admitting: Podiatry

## 2015-10-26 VITALS — Ht 71.0 in | Wt 175.0 lb

## 2015-10-26 DIAGNOSIS — M79676 Pain in unspecified toe(s): Secondary | ICD-10-CM

## 2015-10-26 DIAGNOSIS — B351 Tinea unguium: Secondary | ICD-10-CM

## 2015-10-26 NOTE — Progress Notes (Signed)
SUBJECTIVE Patient presents to office today complaining of elongated, thickened nails. Pain while ambulating in shoes. Patient is unable to trim their own nails.   No Known Allergies  OBJECTIVE General Patient is awake, alert, and oriented x 3 and in no acute distress. Derm Skin is dry and supple bilateral. Negative open lesions or macerations. Remaining integument unremarkable. Nails are tender, long, thickened and dystrophic with subungual debris, consistent with onychomycosis, 1-5 bilateral. No signs of infection noted. Vasc  DP and PT pedal pulses palpable bilaterally. Temperature gradient within normal limits.  Neuro Epicritic and protective threshold sensation diminished bilaterally.  Musculoskeletal Exam No symptomatic pedal deformities noted bilateral. Muscular strength within normal limits.  ASSESSMENT 1. Dermatophytosis of nails 1 through 5 bilateral-painful 2. Onychomycosis of nail due to dermatophyte bilateral 3. Pain in foot bilateral  PLAN OF CARE Patient evaluated today. Instructed to maintain good pedal hygiene and foot care.  Mechanical debridement of nails 1-5 bilaterally performed using a nail nipper. Filed with dremel without incident.  All patient questions were answered. Return to clinic in 3 mos.    Edrick Kins, DPM

## 2015-10-26 NOTE — Patient Instructions (Signed)
Nail Ringworm A fungal infection of the nail (tinea unguium/onychomycosis) is common. It is common as the visible part of the nail is composed of dead cells which have no blood supply to help prevent infection. It occurs because fungi are everywhere and will pick any opportunity to grow on any dead material. Because nails are very slow growing they require up to 2 years of treatment with anti-fungal medications. The entire nail back to the base is infected. This includes approximately  of the nail which you cannot see. If your caregiver has prescribed a medication by mouth, take it every day and as directed. No progress will be seen for at least 6 to 9 months. Do not be disappointed! Because fungi live on dead cells with little or no exposure to blood supply, medication delivery to the infection is slow; thus the cure is slow. It is also why you can observe no progress in the first 6 months. The nail becoming cured is the base of the nail, as it has the blood supply. Topical medication such as creams and ointments are usually not effective. Important in successful treatment of nail fungus is closely following the medication regimen that your doctor prescribes. Sometimes you and your caregiver may elect to speed up this process by surgical removal of all the nails. Even this may still require 6 to 9 months of additional oral medications. See your caregiver as directed. Remember there will be no visible improvement for at least 6 months. See your caregiver sooner if other signs of infection (redness and swelling) develop.   This information is not intended to replace advice given to you by your health care provider. Make sure you discuss any questions you have with your health care provider.   Document Released: 01/22/2000 Document Revised: 06/10/2014 Document Reviewed: 07/28/2014 Elsevier Interactive Patient Education 2016 [employer]  

## 2015-11-06 ENCOUNTER — Encounter (INDEPENDENT_AMBULATORY_CARE_PROVIDER_SITE_OTHER): Payer: Medicare Other | Admitting: *Deleted

## 2015-11-06 VITALS — BP 119/80 | HR 61 | Temp 97.7°F | Wt 171.5 lb

## 2015-11-06 DIAGNOSIS — Z23 Encounter for immunization: Secondary | ICD-10-CM

## 2015-11-06 DIAGNOSIS — Z006 Encounter for examination for normal comparison and control in clinical research program: Secondary | ICD-10-CM | POA: Diagnosis not present

## 2015-11-06 LAB — COMPREHENSIVE METABOLIC PANEL
ALT: 13 U/L (ref 9–46)
AST: 20 U/L (ref 10–35)
Albumin: 4.7 g/dL (ref 3.6–5.1)
Alkaline Phosphatase: 57 U/L (ref 40–115)
BUN: 11 mg/dL (ref 7–25)
CO2: 26 mmol/L (ref 20–31)
Calcium: 9.1 mg/dL (ref 8.6–10.3)
Chloride: 104 mmol/L (ref 98–110)
Creat: 0.78 mg/dL (ref 0.70–1.33)
Glucose, Bld: 74 mg/dL (ref 65–99)
Potassium: 4.2 mmol/L (ref 3.5–5.3)
Sodium: 141 mmol/L (ref 135–146)
Total Bilirubin: 0.5 mg/dL (ref 0.2–1.2)
Total Protein: 8.1 g/dL (ref 6.1–8.1)

## 2015-11-06 NOTE — Progress Notes (Signed)
William [name] is here for his 1 month visit for Reprieve, A Randomized Trial to Prevent Vascular Events in HIV (study drug is Pitavastatin 4mg  or placebo). He says he hasn't missed any doses of meds and feels "good". No muscle aches or weakness. He had nausea for the first few days after taking the new medication, but says that has eased off. He will return in 3 months for the next visit.

## 2015-12-17 DIAGNOSIS — F250 Schizoaffective disorder, bipolar type: Secondary | ICD-10-CM | POA: Diagnosis not present

## 2015-12-21 ENCOUNTER — Other Ambulatory Visit: Payer: Medicare Other

## 2015-12-22 ENCOUNTER — Other Ambulatory Visit: Payer: Medicare Other

## 2015-12-22 DIAGNOSIS — B20 Human immunodeficiency virus [HIV] disease: Secondary | ICD-10-CM

## 2015-12-22 DIAGNOSIS — Z113 Encounter for screening for infections with a predominantly sexual mode of transmission: Secondary | ICD-10-CM

## 2015-12-22 LAB — CBC
HCT: 40.3 % (ref 38.5–50.0)
Hemoglobin: 13.1 g/dL — ABNORMAL LOW (ref 13.2–17.1)
MCH: 29.2 pg (ref 27.0–33.0)
MCHC: 32.5 g/dL (ref 32.0–36.0)
MCV: 89.8 fL (ref 80.0–100.0)
MPV: 9.1 fL (ref 7.5–12.5)
Platelets: 239 10*3/uL (ref 140–400)
RBC: 4.49 MIL/uL (ref 4.20–5.80)
RDW: 14.4 % (ref 11.0–15.0)
WBC: 6.6 10*3/uL (ref 3.8–10.8)

## 2015-12-23 LAB — T-HELPER CELL (CD4) - (RCID CLINIC ONLY)
CD4 % Helper T Cell: 18 % — ABNORMAL LOW (ref 33–55)
CD4 T Cell Abs: 450 /uL (ref 400–2700)

## 2015-12-23 LAB — RPR

## 2015-12-25 LAB — HIV-1 RNA QUANT-NO REFLEX-BLD
HIV 1 RNA Quant: <20 copies/mL (ref <20)
HIV-1 RNA Quant, Log: <1.3 Log copies/mL (ref <1.30)

## 2015-12-29 ENCOUNTER — Encounter: Payer: Self-pay | Admitting: Internal Medicine

## 2015-12-29 ENCOUNTER — Other Ambulatory Visit: Payer: Self-pay | Admitting: *Deleted

## 2015-12-29 ENCOUNTER — Ambulatory Visit (INDEPENDENT_AMBULATORY_CARE_PROVIDER_SITE_OTHER): Payer: Medicare Other | Admitting: Internal Medicine

## 2015-12-29 DIAGNOSIS — J301 Allergic rhinitis due to pollen: Secondary | ICD-10-CM

## 2015-12-29 DIAGNOSIS — L739 Follicular disorder, unspecified: Secondary | ICD-10-CM

## 2015-12-29 DIAGNOSIS — F17210 Nicotine dependence, cigarettes, uncomplicated: Secondary | ICD-10-CM | POA: Diagnosis not present

## 2015-12-29 DIAGNOSIS — B20 Human immunodeficiency virus [HIV] disease: Secondary | ICD-10-CM

## 2015-12-29 MED ORDER — TRIAMCINOLONE ACETONIDE 0.1 % EX CREA: 1.0000 "application " | TOPICAL_CREAM | Freq: Two times a day (BID) | CUTANEOUS | 2 refills | Status: DC

## 2015-12-29 NOTE — Assessment & Plan Note (Signed)
He will continue his Claritin.

## 2015-12-29 NOTE — Assessment & Plan Note (Signed)
I encouraged him to firm up his quit date and let all his friends and family know his intention to quit.

## 2015-12-29 NOTE — Progress Notes (Signed)
Patient Active Problem List   Diagnosis Date Noted  . Human immunodeficiency virus (HIV) disease ([place]) 07/10/2006    Priority: High  . Chronic hepatitis C without hepatic coma ([place]) 07/10/2006    Priority: Medium  . Folliculitis [id number]  . Cigarette smoker 12/23/2014  . Spontaneous pneumothorax 04/24/2013  . Seasonal allergies 01/22/2013  . Dental abscess 11/24/2011  . OTHER NONSPECIFIC ABNORMAL SERUM ENZYME LEVELS 02/16/2010  . PNEUMOCYSTIS PNEUMONIA 07/10/2006  . LOW BACK PAIN 07/10/2006  . ALCOHOL ABUSE, HX OF 07/10/2006    Patient's Medications  New Prescriptions   No medications on file  Previous Medications   [name] 150-150-200-10 MG TABS TABLET    TAKE 1 TABLET BY MOUTH ONCE DAILY WITH BREAKFAST   LORATADINE (CLARITIN) 10 MG TABLET    Take 1 tablet (10 mg total) by mouth daily.   NICOTINE (NICODERM CQ - DOSED IN MG/24 HOURS) 14 MG/24HR PATCH    Place 1 patch (14 mg total) onto the skin daily.   NICOTINE (NICODERM CQ - DOSED IN MG/24 HR) 7 MG/24HR PATCH    Place 1 patch (7 mg total) onto the skin daily.   PITAVASTATIN CALCIUM 4 MG TABS    Take 4 mg by mouth daily. This may be placebo and is study provided. Do not fill prescription.  Modified Medications   Modified Medication Previous Medication   TRIAMCINOLONE CREAM (KENALOG) 0.1 % triamcinolone cream (KENALOG) 0.1 %      Apply 1 application topically 2 (two) times daily.    Apply 1 application topically 2 (two) times daily.  Discontinued Medications   No medications on file    Subjective: William [name] is in for his routine HIV follow-up visit. He takes his [name] faithfully each morning and has not missed any doses. He restarted his Claritin after his last visit and is no longer having any problem with his allergies. He is still using nicotine patches and trying to quit smoking. He smokes about 5 cigarettes daily. He hopes to quit by New Year's Day.   Review of Systems: Review of Systems  Constitutional:  Negative for chills, diaphoresis, fever, malaise/fatigue and weight loss.  HENT: Negative for sore throat.   Respiratory: Negative for cough, sputum production and shortness of breath.   Cardiovascular: Negative for chest pain.  Gastrointestinal: Negative for abdominal pain, diarrhea, nausea and vomiting.  Genitourinary: Negative for dysuria and frequency.  Musculoskeletal: Negative for joint pain and myalgias.  Skin: Negative for rash.  Neurological: Negative for dizziness and headaches.  Psychiatric/Behavioral: Negative for depression and substance abuse. The patient is not nervous/anxious.     Past Medical History:  Diagnosis Date  . HIV (human immunodeficiency virus infection) ([place])   . Pneumothorax     Social History  Substance Use Topics  . Smoking status: Former Smoker    Packs/day: 0.25    Years: 0.00    Types: Cigarettes  . Smokeless tobacco: Never Used     Comment: trying to quit  . Alcohol use 0.0 oz/week     Comment: occassional    No family history on file.  No Known Allergies  Objective:  Vitals:   12/29/15 1029  BP: 117/75  Pulse: 67  Temp: 98.1 F (36.7 C)  TempSrc: Oral  Weight: 171 lb (77.6 kg)  Height: 5\' 11"  (1.803 m)   Body mass index is 23.85 kg/m.  Physical Exam  Constitutional: He is oriented to person, place, and time.  He is  in good spirits.  HENT:  Mouth/Throat: No oropharyngeal exudate.  Eyes: Conjunctivae are normal.  Cardiovascular: Normal rate and regular rhythm.   No murmur heard. Pulmonary/Chest: Breath sounds normal.  Abdominal: Soft. He exhibits no mass. There is no tenderness.  Musculoskeletal: Normal range of motion.  Neurological: He is alert and oriented to person, place, and time.  Skin: Rash noted.  His chronic right elbow rash is improving.  Psychiatric: Mood and affect normal.    Lab Results Lab Results  Component Value Date   WBC 6.6 12/22/2015   HGB 13.1 (L) 12/22/2015   HCT 40.3 12/22/2015   MCV 89.8  12/22/2015   PLT 239 12/22/2015    Lab Results  Component Value Date   CREATININE 0.78 11/06/2015   BUN 11 11/06/2015   NA 141 11/06/2015   K 4.2 11/06/2015   CL 104 11/06/2015   CO2 26 11/06/2015    Lab Results  Component Value Date   ALT 13 11/06/2015   AST 20 11/06/2015   ALKPHOS 57 11/06/2015   BILITOT 0.5 11/06/2015    Lab Results  Component Value Date   CHOL 178 09/24/2015   HDL 69 09/24/2015   LDLCALC 98 09/24/2015   TRIG 57 09/24/2015   CHOLHDL 2.6 09/24/2015   HIV 1 RNA Quant (copies/mL)  Date Value  12/22/2015 <20  09/15/2015 <20  05/25/2015 165 (H)   CD4 T Cell Abs (/uL)  Date Value  12/22/2015 450  09/15/2015 320 (L)  05/25/2015 340 (L)     Problem List Items Addressed This Visit      High   Human immunodeficiency virus (HIV) disease ([place])    His infection is under excellent control he's had complete CD4 reconstitution. He will continue [name] in follow-up after blood work in 6 months.      Relevant Orders   T-helper cell (CD4)- ([facility] only)   HIV 1 RNA quant-no reflex-bld   CBC   Comprehensive metabolic panel   RPR     Unprioritized   Cigarette smoker    I encouraged him to firm up his quit date and let all his friends and family know his intention to quit.      Seasonal allergies    He will continue his Claritin.           Michel Bickers, MD [facility] for [facility] [place] [facility] [phone] pager   [phone] cell 12/29/2015, 10:50 AM

## 2015-12-29 NOTE — Assessment & Plan Note (Signed)
His infection is under excellent control he's had complete CD4 reconstitution. He will continue [name] in follow-up after blood work in 6 months.

## 2016-01-26 ENCOUNTER — Ambulatory Visit (INDEPENDENT_AMBULATORY_CARE_PROVIDER_SITE_OTHER): Payer: Medicare Other | Admitting: Podiatry

## 2016-01-26 ENCOUNTER — Encounter: Payer: Self-pay | Admitting: Podiatry

## 2016-01-26 VITALS — Ht 71.0 in | Wt 171.0 lb

## 2016-01-26 DIAGNOSIS — M79676 Pain in unspecified toe(s): Secondary | ICD-10-CM

## 2016-01-26 DIAGNOSIS — B351 Tinea unguium: Secondary | ICD-10-CM

## 2016-01-26 NOTE — Progress Notes (Addendum)
Complaint:  Visit Type: Patient returns to my office for continued preventative foot care services. Complaint: Patient states" my nails have grown long and thick and become painful to walk and wear shoes"  The patient presents for preventative foot care services. No changes to ROS  Podiatric Exam: Vascular: dorsalis pedis and posterior tibial pulses are palpable bilateral. Capillary return is immediate. Temperature gradient is WNL. Skin turgor WNL  Sensorium: Normal Semmes Weinstein monofilament test. Normal tactile sensation bilaterally. Nail Exam: Pt has thick disfigured discolored nails with subungual debris noted bilateral entire nail hallux through fifth toenails Ulcer Exam: There is no evidence of ulcer or pre-ulcerative changes or infection. Orthopedic Exam: Muscle tone and strength are WNL. No limitations in general ROM. No crepitus or effusions noted. Foot type and digits show no abnormalities.  Severe HAV  B/L.  Skin: No Porokeratosis. No infection or ulcers.  Dermatitis left hyallux and left ankle.  Diagnosis:  Onychomycosis, , Pain in right toe, pain in left toes  Treatment & Plan Procedures and Treatment: Consent by patient was obtained for treatment procedures. The patient understood the discussion of treatment and procedures well. All questions were answered thoroughly reviewed. Debridement of mycotic and hypertrophic toenails, 1 through 5 bilateral and clearing of subungual debris. No ulceration, no infection noted.  Return Visit-Office Procedure: Patient instructed to return to the office for a follow up visit 3 months for continued evaluation and treatment.    Gardiner Barefoot DPM

## 2016-02-04 ENCOUNTER — Encounter (INDEPENDENT_AMBULATORY_CARE_PROVIDER_SITE_OTHER): Payer: Medicare Other | Admitting: Licensed Clinical Social Worker

## 2016-02-04 VITALS — BP 130/83 | HR 64 | Temp 97.7°F | Wt 170.5 lb

## 2016-02-04 DIAGNOSIS — Z006 Encounter for examination for normal comparison and control in clinical research program: Secondary | ICD-10-CM

## 2016-02-04 NOTE — Progress Notes (Signed)
William [name] is here today for month 4 reprieve visit, he states that he feels fine and is taking his medication everyday. Pleas denies any pain or muscle aches. He will return on 06/08/2015. Quaid picked up study drugs today.

## 2016-04-26 ENCOUNTER — Ambulatory Visit: Payer: Medicare Other | Admitting: Podiatry

## 2016-06-08 ENCOUNTER — Other Ambulatory Visit: Payer: Medicare Other

## 2016-06-23 ENCOUNTER — Ambulatory Visit: Payer: Medicare Other | Admitting: Internal Medicine

## 2016-06-23 ENCOUNTER — Encounter (INDEPENDENT_AMBULATORY_CARE_PROVIDER_SITE_OTHER): Payer: Medicare Other | Admitting: *Deleted

## 2016-06-23 VITALS — BP 119/86 | HR 69 | Temp 97.7°F | Wt 162.2 lb

## 2016-06-23 DIAGNOSIS — Z006 Encounter for examination for normal comparison and control in clinical research program: Secondary | ICD-10-CM

## 2016-06-23 NOTE — Progress Notes (Signed)
William [name] is here for his month 8 study visit for Reprieve, A Randomized Trial to Prevent Vascular Events in HIV (study drug is Pitavastatin 4mg  or placebo)  He denies any new problems or concerns. He says he has been very adherent with his study meds and denies any side effects from the drug. He missed his appt. For Dr. Megan Salon today and has rescheduled for a few weeks. He will return for his 12 month Reprieve visit in August.

## 2016-07-19 ENCOUNTER — Encounter: Payer: Self-pay | Admitting: Internal Medicine

## 2016-07-19 ENCOUNTER — Ambulatory Visit (INDEPENDENT_AMBULATORY_CARE_PROVIDER_SITE_OTHER): Payer: Medicare Other | Admitting: Internal Medicine

## 2016-07-19 DIAGNOSIS — B20 Human immunodeficiency virus [HIV] disease: Secondary | ICD-10-CM | POA: Diagnosis not present

## 2016-07-19 DIAGNOSIS — F17210 Nicotine dependence, cigarettes, uncomplicated: Secondary | ICD-10-CM | POA: Diagnosis not present

## 2016-07-19 DIAGNOSIS — J301 Allergic rhinitis due to pollen: Secondary | ICD-10-CM | POA: Diagnosis not present

## 2016-07-19 NOTE — Assessment & Plan Note (Signed)
I encouraged him to go ahead and set a quit date and share that information with his friends and family and ask for their support in quitting completely. He continues to use nicotine patches.

## 2016-07-19 NOTE — Assessment & Plan Note (Signed)
He is not having any current symptoms and is not on any treatment for seasonal allergies at this time.

## 2016-07-19 NOTE — Assessment & Plan Note (Signed)
I had him meet with Dimitri Ped, our infectious disease pharmacist to see if we can understand what has caused his recent problem getting [name]. I will repeat his lab work today and see him back in 3 months. I asked him to call us right away at any time in the future if he has any difficulty getting his medication.

## 2016-07-19 NOTE — Progress Notes (Signed)
Patient Active Problem List   Diagnosis Date Noted  . Human immunodeficiency virus (HIV) disease ([place]) 07/10/2006    Priority: High  . Chronic hepatitis C without hepatic coma ([place]) 07/10/2006    Priority: Medium  . Folliculitis 67/59/1638  . Cigarette smoker 12/23/2014  . Spontaneous pneumothorax 04/24/2013  . Seasonal allergies 01/22/2013  . Dental abscess 11/24/2011  . OTHER NONSPECIFIC ABNORMAL SERUM ENZYME LEVELS 02/16/2010  . PNEUMOCYSTIS PNEUMONIA 07/10/2006  . LOW BACK PAIN 07/10/2006  . ALCOHOL ABUSE, HX OF 07/10/2006    Patient's Medications  New Prescriptions   No medications on file  Previous Medications   [name] 150-150-200-10 MG TABS TABLET    TAKE 1 TABLET BY MOUTH ONCE DAILY WITH BREAKFAST   LORATADINE (CLARITIN) 10 MG TABLET    Take 1 tablet (10 mg total) by mouth daily.   NICOTINE (NICODERM CQ - DOSED IN MG/24 HOURS) 14 MG/24HR PATCH    Place 1 patch (14 mg total) onto the skin daily.   NICOTINE (NICODERM CQ - DOSED IN MG/24 HR) 7 MG/24HR PATCH    Place 1 patch (7 mg total) onto the skin daily.   PITAVASTATIN CALCIUM 4 MG TABS    Take 4 mg by mouth daily. This may be placebo and is study provided. Do not fill prescription.   TRIAMCINOLONE CREAM (KENALOG) 0.1 %    Apply 1 application topically 2 (two) times daily.  Modified Medications   No medications on file  Discontinued Medications   No medications on file    Subjective: William [name] is in for his routine HIV follow-up visit. He states that he lost his insurance and had to be off of his [name]. He initially said that he was off for about a month and said he wasn't certain and he may have been off for several months. He has also noted since his last visit that when he does get his [name] the co-pay seems to change each month. In the past he did not have any co-pay. He has been back on [name] for several weeks. He is not taking anything for his seasonal allergies because he does not feel he needs  anything now. He is down to smoking 2-3 cigarettes daily. He has not tried to set a quit date.   Review of Systems: Review of Systems  Constitutional: Negative for chills, diaphoresis, fever, malaise/fatigue and weight loss.  HENT: Negative for sore throat.   Respiratory: Negative for cough, sputum production and shortness of breath.   Cardiovascular: Negative for chest pain.  Gastrointestinal: Negative for abdominal pain, diarrhea, heartburn, nausea and vomiting.  Genitourinary: Negative for dysuria and frequency.  Musculoskeletal: Negative for joint pain and myalgias.  Skin: Negative for rash.  Neurological: Negative for dizziness and headaches.  Psychiatric/Behavioral: Negative for depression and substance abuse. The patient is not nervous/anxious.     Past Medical History:  Diagnosis Date  . HIV (human immunodeficiency virus infection) ([place])   . Pneumothorax     Social History  Substance Use Topics  . Smoking status: Former Smoker    Packs/day: 0.25    Years: 0.00    Types: Cigarettes  . Smokeless tobacco: Never Used     Comment: trying to quit  . Alcohol use 0.0 oz/week     Comment: occassional    No family history on file.  No Known Allergies  Objective:  Vitals:   07/19/16 1028  BP: 126/80  Pulse: 61  Temp: Marland Kitchen)  94.7 F (34.8 C)  TempSrc: Oral  Weight: 163 lb (73.9 kg)  Height: 5\' 11"  (1.803 m)   Body mass index is 22.73 kg/m.  Physical Exam  Constitutional: He is oriented to person, place, and time.  He is in good spirits. His weight is down 8 pounds over the past 6 months.  HENT:  Mouth/Throat: No oropharyngeal exudate.  Eyes: Conjunctivae are normal.  Cardiovascular: Normal rate and regular rhythm.   No murmur heard. Pulmonary/Chest: Effort normal and breath sounds normal.  Abdominal: Soft. He exhibits no mass. There is no tenderness.  Musculoskeletal: Normal range of motion.  Neurological: He is alert and oriented to person, place, and time.    Skin: No rash noted.  Psychiatric: Mood and affect normal.    Lab Results Lab Results  Component Value Date   WBC 6.6 12/22/2015   HGB 13.1 (L) 12/22/2015   HCT 40.3 12/22/2015   MCV 89.8 12/22/2015   PLT 239 12/22/2015    Lab Results  Component Value Date   CREATININE 0.78 11/06/2015   BUN 11 11/06/2015   NA 141 11/06/2015   K 4.2 11/06/2015   CL 104 11/06/2015   CO2 26 11/06/2015    Lab Results  Component Value Date   ALT 13 11/06/2015   AST 20 11/06/2015   ALKPHOS 57 11/06/2015   BILITOT 0.5 11/06/2015    Lab Results  Component Value Date   CHOL 178 09/24/2015   HDL 69 09/24/2015   LDLCALC 98 09/24/2015   TRIG 57 09/24/2015   CHOLHDL 2.6 09/24/2015   HIV 1 RNA Quant (copies/mL)  Date Value  12/22/2015 <20  09/15/2015 <20  05/25/2015 165 (H)   CD4 T Cell Abs (/uL)  Date Value  12/22/2015 450  09/15/2015 320 (L)  05/25/2015 340 (L)     Problem List Items Addressed This Visit      High   Human immunodeficiency virus (HIV) disease ([place])    I had him meet with Dimitri Ped, our infectious disease pharmacist to see if we can understand what has caused his recent problem getting [name]. I will repeat his lab work today and see him back in 3 months. I asked him to call us right away at any time in the future if he has any difficulty getting his medication.        Unprioritized   Cigarette smoker    I encouraged him to go ahead and set a quit date and share that information with his friends and family and ask for their support in quitting completely. He continues to use nicotine patches.      Seasonal allergies    He is not having any current symptoms and is not on any treatment for seasonal allergies at this time.           Michel Bickers, MD [facility] for [facility] [phone] pager   [phone] cell 07/19/2016, 10:48 AM

## 2016-08-15 ENCOUNTER — Other Ambulatory Visit: Payer: Self-pay | Admitting: Internal Medicine

## 2016-08-15 DIAGNOSIS — B20 Human immunodeficiency virus [HIV] disease: Secondary | ICD-10-CM

## 2016-08-18 DIAGNOSIS — F250 Schizoaffective disorder, bipolar type: Secondary | ICD-10-CM | POA: Diagnosis not present

## 2016-08-23 DIAGNOSIS — F250 Schizoaffective disorder, bipolar type: Secondary | ICD-10-CM | POA: Diagnosis not present

## 2016-08-29 ENCOUNTER — Encounter (HOSPITAL_COMMUNITY): Payer: Self-pay | Admitting: Emergency Medicine

## 2016-08-29 ENCOUNTER — Ambulatory Visit (HOSPITAL_COMMUNITY)
Admission: EM | Admit: 2016-08-29 | Discharge: 2016-08-29 | Disposition: A | Discharge status: Home / Self Care | Payer: Medicare Other | Attending: Emergency Medicine | Admitting: Emergency Medicine

## 2016-08-29 ENCOUNTER — Emergency Department (HOSPITAL_COMMUNITY): Payer: Medicare Other | Admitting: Certified Registered"

## 2016-08-29 ENCOUNTER — Encounter (HOSPITAL_COMMUNITY)
Admission: EM | Disposition: A | Discharge status: Home / Self Care | Payer: Self-pay | Source: Home / Self Care | Attending: Emergency Medicine

## 2016-08-29 DIAGNOSIS — Z21 Asymptomatic human immunodeficiency virus [HIV] infection status: Secondary | ICD-10-CM | POA: Diagnosis not present

## 2016-08-29 DIAGNOSIS — K319 Disease of stomach and duodenum, unspecified: Secondary | ICD-10-CM | POA: Insufficient documentation

## 2016-08-29 DIAGNOSIS — T18128A Food in esophagus causing other injury, initial encounter: Secondary | ICD-10-CM

## 2016-08-29 DIAGNOSIS — Z87891 Personal history of nicotine dependence: Secondary | ICD-10-CM | POA: Diagnosis not present

## 2016-08-29 DIAGNOSIS — K208 Other esophagitis: Secondary | ICD-10-CM | POA: Diagnosis not present

## 2016-08-29 DIAGNOSIS — R1310 Dysphagia, unspecified: Secondary | ICD-10-CM | POA: Diagnosis not present

## 2016-08-29 DIAGNOSIS — K2210 Ulcer of esophagus without bleeding: Secondary | ICD-10-CM | POA: Diagnosis not present

## 2016-08-29 DIAGNOSIS — K2970 Gastritis, unspecified, without bleeding: Secondary | ICD-10-CM | POA: Insufficient documentation

## 2016-08-29 DIAGNOSIS — K3189 Other diseases of stomach and duodenum: Secondary | ICD-10-CM | POA: Diagnosis not present

## 2016-08-29 DIAGNOSIS — K222 Esophageal obstruction: Secondary | ICD-10-CM

## 2016-08-29 DIAGNOSIS — Z79899 Other long term (current) drug therapy: Secondary | ICD-10-CM | POA: Insufficient documentation

## 2016-08-29 HISTORY — PX: ESOPHAGOGASTRODUODENOSCOPY (EGD) WITH PROPOFOL: SHX5813

## 2016-08-29 LAB — CBC WITH DIFFERENTIAL/PLATELET
Basophils Absolute: 0 10*3/uL (ref 0.0–0.1)
Basophils Relative: 0 %
Eosinophils Absolute: 0.1 10*3/uL (ref 0.0–0.7)
Eosinophils Relative: 1 %
HCT: 42.1 % (ref 39.0–52.0)
Hemoglobin: 14.1 g/dL (ref 13.0–17.0)
Lymphocytes Relative: 29 %
Lymphs Abs: 2.5 10*3/uL (ref 0.7–4.0)
MCH: 27 pg (ref 26.0–34.0)
MCHC: 33.5 g/dL (ref 30.0–36.0)
MCV: 80.7 fL (ref 78.0–100.0)
Monocytes Absolute: 1 10*3/uL (ref 0.1–1.0)
Monocytes Relative: 12 %
Neutro Abs: 5 10*3/uL (ref 1.7–7.7)
Neutrophils Relative %: 58 %
Platelets: 211 10*3/uL (ref 150–400)
RBC: 5.22 MIL/uL (ref 4.22–5.81)
RDW: 14.2 % (ref 11.5–15.5)
WBC: 8.7 10*3/uL (ref 4.0–10.5)

## 2016-08-29 LAB — LIPASE, BLOOD: Lipase: 18 U/L (ref 11–51)

## 2016-08-29 LAB — COMPREHENSIVE METABOLIC PANEL
ALT: 26 U/L (ref 17–63)
AST: 37 U/L (ref 15–41)
Albumin: 4.6 g/dL (ref 3.5–5.0)
Alkaline Phosphatase: 63 U/L (ref 38–126)
Anion gap: 11 (ref 5–15)
BUN: 19 mg/dL (ref 6–20)
CO2: 23 mmol/L (ref 22–32)
Calcium: 9.7 mg/dL (ref 8.9–10.3)
Chloride: 104 mmol/L (ref 101–111)
Creatinine, Ser: 0.99 mg/dL (ref 0.61–1.24)
GFR calc Af Amer: >60 mL/min (ref >60)
GFR calc non Af Amer: >60 mL/min (ref >60)
Glucose, Bld: 91 mg/dL (ref 65–99)
Potassium: 3.5 mmol/L (ref 3.5–5.1)
Sodium: 138 mmol/L (ref 135–145)
Total Bilirubin: 1.1 mg/dL (ref 0.3–1.2)
Total Protein: 8.9 g/dL — ABNORMAL HIGH (ref 6.5–8.1)

## 2016-08-29 SURGERY — ESOPHAGOGASTRODUODENOSCOPY (EGD) WITH PROPOFOL
Anesthesia: General

## 2016-08-29 MED ORDER — MIDAZOLAM HCL 5 MG/5ML IJ SOLN
INTRAMUSCULAR | Status: DC | PRN
  Administered 2016-08-29: 2 mg via INTRAVENOUS

## 2016-08-29 MED ORDER — MIDAZOLAM HCL 2 MG/2ML IJ SOLN
INTRAMUSCULAR | Status: AC
  Filled 2016-08-29: qty 2

## 2016-08-29 MED ORDER — PANTOPRAZOLE SODIUM 40 MG PO TBEC: 40.0000 mg | DELAYED_RELEASE_TABLET | Freq: Two times a day (BID) | ORAL | 2 refills | Status: DC

## 2016-08-29 MED ORDER — PROPOFOL 10 MG/ML IV BOLUS
INTRAVENOUS | Status: DC | PRN
  Administered 2016-08-29: 180 mg via INTRAVENOUS

## 2016-08-29 MED ORDER — PANTOPRAZOLE SODIUM 40 MG PO TBEC: 40.0000 mg | DELAYED_RELEASE_TABLET | Freq: Two times a day (BID) | ORAL | Status: DC

## 2016-08-29 MED ORDER — PANTOPRAZOLE SODIUM 40 MG IV SOLR
40.0000 mg | Freq: Once | INTRAVENOUS | Status: AC
  Administered 2016-08-29: 40 mg via INTRAVENOUS
  Filled 2016-08-29: qty 40

## 2016-08-29 MED ORDER — LIDOCAINE HCL (CARDIAC) 20 MG/ML IV SOLN
INTRAVENOUS | Status: DC | PRN
  Administered 2016-08-29: 100 mg via INTRAVENOUS

## 2016-08-29 MED ORDER — FENTANYL CITRATE (PF) 100 MCG/2ML IJ SOLN
INTRAMUSCULAR | Status: DC | PRN
  Administered 2016-08-29: 100 ug via INTRAVENOUS

## 2016-08-29 MED ORDER — ONDANSETRON HCL 4 MG/2ML IJ SOLN
INTRAMUSCULAR | Status: DC | PRN
  Administered 2016-08-29: 4 mg via INTRAVENOUS

## 2016-08-29 MED ORDER — ONDANSETRON HCL 4 MG/2ML IJ SOLN
4.0000 mg | Freq: Once | INTRAMUSCULAR | Status: AC
  Administered 2016-08-29: 4 mg via INTRAVENOUS
  Filled 2016-08-29: qty 2

## 2016-08-29 MED ORDER — SUCCINYLCHOLINE CHLORIDE 20 MG/ML IJ SOLN
INTRAMUSCULAR | Status: DC | PRN
  Administered 2016-08-29: 120 mg via INTRAVENOUS

## 2016-08-29 MED ORDER — FENTANYL CITRATE (PF) 100 MCG/2ML IJ SOLN
INTRAMUSCULAR | Status: AC
  Filled 2016-08-29: qty 2

## 2016-08-29 MED ORDER — SODIUM CHLORIDE 0.9 % IV SOLN: INTRAVENOUS | Status: DC

## 2016-08-29 MED ORDER — SODIUM CHLORIDE 0.9 % IV BOLUS (SEPSIS)
1000.0000 mL | Freq: Once | INTRAVENOUS | Status: AC
  Administered 2016-08-29: 1000 mL via INTRAVENOUS

## 2016-08-29 MED ORDER — LACTATED RINGERS IV SOLN
INTRAVENOUS | Status: DC | PRN
  Administered 2016-08-29: 13:00:00 via INTRAVENOUS

## 2016-08-29 SURGICAL SUPPLY — 15 items

## 2016-08-29 NOTE — Anesthesia Procedure Notes (Signed)
Procedure Name: Intubation Date/Time: 08/29/2016 1:39 PM Performed by: Lance Coon Pre-anesthesia Checklist: Patient identified, Emergency Drugs available, Suction available, Patient being monitored and Timeout performed Patient Re-evaluated:Patient Re-evaluated prior to induction Oxygen Delivery Method: Circle system utilized Preoxygenation: Pre-oxygenation with 100% oxygen Induction Type: IV induction, Rapid sequence and Cricoid Pressure applied Laryngoscope Size: Miller and 3 Grade View: Grade I Tube type: Oral Tube size: 7.5 mm Number of attempts: 1 Airway Equipment and Method: Stylet Placement Confirmation: ETT inserted through vocal cords under direct vision,  positive ETCO2 and breath sounds checked- equal and bilateral Secured at: 21 cm Tube secured with: Tape Dental Injury: Teeth and Oropharynx as per pre-operative assessment

## 2016-08-29 NOTE — Discharge Instructions (Signed)

## 2016-08-29 NOTE — Anesthesia Preprocedure Evaluation (Addendum)
Anesthesia Evaluation  Patient identified by MRN, date of birth, ID band Patient awake    Reviewed: Allergy & Precautions, H&P , NPO status , Patient's Chart, lab work & pertinent test results  History of Anesthesia Complications Negative for: history of anesthetic complications  Airway Mallampati: I  TM Distance: >3 FB     Dental no notable dental hx. (+) Dental Advisory Given   Pulmonary neg pulmonary ROS, former smoker,    Pulmonary exam normal        Cardiovascular negative cardio ROS Normal cardiovascular exam     Neuro/Psych negative neurological ROS  negative psych ROS   GI/Hepatic negative GI ROS, (+)     substance abuse  alcohol use, Hepatitis -, C  Endo/Other  negative endocrine ROS  Renal/GU negative Renal ROS  negative genitourinary   Musculoskeletal   Abdominal   Peds  Hematology  (+) HIV,   Anesthesia Other Findings   Reproductive/Obstetrics negative OB ROS                            Anesthesia Physical Anesthesia Plan  ASA: III  Anesthesia Plan: General   Post-op Pain Management:    Induction: Intravenous, Rapid sequence and Cricoid pressure planned  PONV Risk Score and Plan: 2 and Ondansetron and Dexamethasone  Airway Management Planned: Oral ETT  Additional Equipment:   Intra-op Plan:   Post-operative Plan: Extubation in OR  Informed Consent: I have reviewed the patients History and Physical, chart, labs and discussed the procedure including the risks, benefits and alternatives for the proposed anesthesia with the patient or authorized representative who has indicated his/her understanding and acceptance.   Dental advisory given  Plan Discussed with: CRNA, Anesthesiologist and Surgeon  Anesthesia Plan Comments:        Anesthesia Quick Evaluation

## 2016-08-29 NOTE — Consult Note (Signed)
Referring Provider:  Dr. Tomi Bamberger Primary Care Physician:  Michel Bickers, MD Primary Gastroenterologist:  Althia Forts  Reason for Consultation:  Dysphagia  HPI: William [name] is a 56 y.o. male with past medical history of HIV,  came into the ED with the complaining of dysphagia with sensation of food getting stuck in esophagus. GI is consulted for further evaluation.  Patient seen and examined in the ER. According to patient, he was eating meat on Saturday and he felt like it got stuck in the esophagus. Patient is not able to eat or drink anything since Saturday. Not able to keep down water as well. He denied any diarrhea or constipation. Denied any blood in the stool or black stool.  Denied similar symptoms in the past. No previous EGD.    Past Medical History:  Diagnosis Date  . HIV (human immunodeficiency virus infection) ([place])   . Pneumothorax     Past Surgical History:  Procedure Laterality Date  . CHEST TUBE INSERTION    . MULTIPLE EXTRACTIONS WITH ALVEOLOPLASTY N/A 05/23/2014   Procedure: EXTRACTIONS OF ALL REMAINING TEETH WITH ALVEOLOPLASTY, REMOVAL OF LEFT OSSEOUS EXOTOSIS, REMOVAL MAXILLARY OSSEOUS TUBEROSITY;  Surgeon: Diona Browner, DDS;  Location: [place];  Service: Oral Surgery;  Laterality: N/A;  . PLEURADESIS Left 04/26/2013   Procedure: PLEURADESIS;  Surgeon: Gaye Pollack, MD;  Location: [facility] OR;  Service: Thoracic;  Laterality: Left;  Mechanical  . STAPLING OF BLEBS Left 04/26/2013   Procedure: STAPLING OF BLEBS;  Surgeon: Gaye Pollack, MD;  Location: [place];  Service: Thoracic;  Laterality: Left;  Marland Kitchen VIDEO ASSISTED THORACOSCOPY Left 04/26/2013   Procedure: VIDEO ASSISTED THORACOSCOPY;  Surgeon: Gaye Pollack, MD;  Location: [facility] OR;  Service: Thoracic;  Laterality: Left;    Prior to Admission medications   Medication Sig Start Date End Date Taking? Authorizing Provider  [name] 150-150-200-10 MG TABS tablet take 1 tablet by mouth once daily WITH BREAKFAST 08/15/16    Michel Bickers, MD  loratadine (CLARITIN) 10 MG tablet Take 1 tablet (10 mg total) by mouth daily. 06/02/15   Michel Bickers, MD  nicotine (NICODERM CQ - DOSED IN MG/24 HOURS) 14 mg/24hr patch Place 1 patch (14 mg total) onto the skin daily. Patient not taking: Reported on 07/19/2016 06/02/15   Michel Bickers, MD  nicotine (NICODERM CQ - DOSED IN MG/24 HR) 7 mg/24hr patch Place 1 patch (7 mg total) onto the skin daily. Patient not taking: Reported on 07/19/2016 09/15/15   Michel Bickers, MD  Pitavastatin Calcium 4 MG TABS Take 4 mg by mouth daily. This may be placebo and is study provided. Do not fill prescription. 10/01/15   [provider]  triamcinolone cream (KENALOG) 0.1 % Apply 1 application topically 2 (two) times daily. 12/29/15   Michel Bickers, MD    Scheduled Meds: Continuous Infusions: PRN Meds:.  Allergies as of 08/29/2016  . (No Known Allergies)    No family history on file.  Social History   Social History  . Marital status: Married    Spouse name: N/A  . Number of children: N/A  . Years of education: N/A   Occupational History  . Not on file.   Social History Main Topics  . Smoking status: Former Smoker    Packs/day: 0.25    Years: 0.00    Types: Cigarettes  . Smokeless tobacco: Never Used     Comment: trying to quit  . Alcohol use 0.0 oz/week     Comment: occassional  .  Drug use: No  . Sexual activity: Not Currently     Comment: given condoms   Other Topics Concern  . Not on file   Social History Narrative  . No narrative on file    Review of Systems: Review of Systems  Constitutional: Negative for chills and fever.  HENT: Negative for hearing loss and tinnitus.   Eyes: Negative for blurred vision and double vision.  Respiratory: Negative for cough, hemoptysis and sputum production.   Cardiovascular: Negative for chest pain and palpitations.  Gastrointestinal: Positive for heartburn, nausea and vomiting. Negative for abdominal pain, blood in  stool, constipation, diarrhea and melena.  Genitourinary: Negative for dysuria and urgency.  Musculoskeletal: Negative for myalgias.  Skin: Negative for itching and rash.  Neurological: Negative for seizures and loss of consciousness.  Endo/Heme/Allergies: Does not bruise/bleed easily.  Psychiatric/Behavioral: Negative for hallucinations and suicidal ideas.    Physical Exam: Vital signs: Vitals:   08/29/16 0859  BP: 108/77  Pulse: (!) 108  Resp: 16  Temp: 97.9 F (36.6 C)     General:   Alert,  Well-developed, well-nourished, pleasant and cooperative in NAD HEENT: Normocephalic, atraumatic, oropharynx clear. Coated tongue. Lungs:  Clear throughout to auscultation.   No wheezes, crackles, or rhonchi. No acute distress. Heart:  Regular rate and rhythm; no murmurs, clicks, rubs,  or gallops. Abdomen: Soft, nontender, nondistended, bowel sounds present. No peritoneal signs. LE : No edema. Pulses present Rectal:  Deferred  GI:  Lab Results:  Recent Labs  08/29/16 0953  WBC 8.7  HGB 14.1  HCT 42.1  PLT 211   BMET  Recent Labs  08/29/16 0953  NA 138  K 3.5  CL 104  CO2 23  GLUCOSE 91  BUN 19  CREATININE 0.99  CALCIUM 9.7   LFT  Recent Labs  08/29/16 0953  PROT 8.9*  ALBUMIN 4.6  AST 37  ALT 26  ALKPHOS 63  BILITOT 1.1   PT/INR No results for input(s): LABPROT, INR in the last 72 hours.   Studies/Results: No results found.  Impression/Plan: - Dysphagia with sensation of food getting stuck at the lower esophagus. Patient with history of HIV. Differential diagnosis would be esophagitis versus lower esophageal ring.  Recommendations ---------------------------- - EGD for further evaluation. Risk benefits alternatives discussed with the patient. He verbalized understanding.  - 1 dose of IV PPI.  - Further plan based on endoscopic finding.    LOS: 0 days   Otis Brace  MD, FACP 08/29/2016, 11:57 AM  Pager [phone] If no answer or  after 5 PM call [phone]

## 2016-08-29 NOTE — Anesthesia Postprocedure Evaluation (Signed)
Anesthesia Post Note  Patient: William [name]  Procedure(s) Performed: Procedure(s) (LRB): ESOPHAGOGASTRODUODENOSCOPY (EGD) WITH PROPOFOL (N/A)     Patient location during evaluation: PACU Anesthesia Type: General Level of consciousness: sedated Pain management: pain level controlled Vital Signs Assessment: post-procedure vital signs reviewed and stable Respiratory status: spontaneous breathing and respiratory function stable Cardiovascular status: stable Anesthetic complications: no    Last Vitals:  Vitals:   08/29/16 1410 08/29/16 1420  BP: (!) 138/99 129/84  Pulse:    Resp: (!) 21 15  Temp:      Last Pain:  Vitals:   08/29/16 1315  TempSrc: Oral  PainSc:                  [place]

## 2016-08-29 NOTE — ED Notes (Signed)
Report given to ENDO RN. Pt. Notified sister to come to hospital to take him home after procedure. Phone number given to ENDO RN. Pt. Transported to suite by tech. Pt. 3x belongings bags taken with him.

## 2016-08-29 NOTE — Op Note (Signed)
[facility] Patient Name: William [name] Procedure Date : 08/29/2016 MRN: [id number] Attending MD: Otis Brace , MD Date of Birth: 1960-10-06 CSN: [id number] Age: 56 Admit Type: Outpatient Procedure:                Upper GI endoscopy Indications:              Dysphagia Providers:                Otis Brace, MD, Kingsley Plan, RN, Elspeth Cho Tech., Technician, Lance Coon, CRNA Referring MD:              Medicines:                General Anesthesia Complications:            No immediate complications. Estimated Blood Loss:     Estimated blood loss was minimal. Procedure:                Pre-Anesthesia Assessment:                           - Prior to the procedure, a History and Physical                            was performed, and patient medications and                            allergies were reviewed. The patient's tolerance of                            previous anesthesia was also reviewed. The risks                            and benefits of the procedure and the sedation                            options and risks were discussed with the patient.                            All questions were answered, and informed consent                            was obtained. Prior Anticoagulants: The patient has                            taken no previous anticoagulant or antiplatelet                            agents. ASA Grade Assessment: II - A patient with                            mild systemic disease. After reviewing the risks  and benefits, the patient was deemed in                            satisfactory condition to undergo the procedure.                           After obtaining informed consent, the endoscope was                            passed under direct vision. Throughout the                            procedure, the patient's blood pressure, pulse, and   oxygen saturations were monitored continuously. The                            EG-[id number] ([id number]) scope was introduced through the                            mouth, and advanced to the second part of duodenum.                            The upper GI endoscopy was accomplished without                            difficulty. The patient tolerated the procedure                            well. Scope In: Scope Out: Findings:      LA Grade D (one or more mucosal breaks involving at least 75% of       esophageal circumference) esophagitis with no bleeding was found.       Esophagitis was located in the middle esophagus. Biopsies were taken       with a cold forceps for histology.      A small amount of food (residue) was found in the gastric fundus.      Scattered mild inflammation was found in the gastric fundus and in the       prepyloric region of the stomach. Biopsies were taken with a cold       forceps for Helicobacter pylori testing.      The cardia and gastric fundus were normal on retroflexion.      The duodenal bulb, first portion of the duodenum and second portion of       the duodenum were normal. Impression:               - LA Grade D erosive esophagitis. Biopsied.                           - A small amount of food (residue) in the stomach.                           - Gastritis. Biopsied.                           - Normal duodenal bulb, first portion  of the                            duodenum and second portion of the duodenum. Moderate Sedation:      none. Recommendation:           - Patient has a contact number available for                            emergencies. The signs and symptoms of potential                            delayed complications were discussed with the                            patient. Return to normal activities tomorrow.                            Written discharge instructions were provided to the                            patient.                            - Soft diet.                           - Use Protonix (pantoprazole) 40 mg PO BID.                           - No aspirin, ibuprofen, naproxen, or other                            non-steroidal anti-inflammatory drugs.                           - Await pathology results.                           - Repeat upper endoscopy in 8 weeks to check                            healing.                           - Return to my office in 6 weeks. Procedure Code(s):        --- Professional ---                           [phone], Esophagogastroduodenoscopy, flexible,                            transoral; with biopsy, single or multiple Diagnosis Code(s):        --- Professional ---                           K20.8, Other esophagitis  K29.70, Gastritis, unspecified, without bleeding                           R13.10, Dysphagia, unspecified CPT copyright 2016 American Medical Association. All rights reserved. The codes documented in this report are preliminary and upon coder review may  be revised to meet current compliance requirements. Otis Brace, MD Otis Brace, MD 08/29/2016 2:07:24 PM Number of Addenda: 0

## 2016-08-29 NOTE — ED Provider Notes (Signed)
[facility] Emergency Department Provider Note  ED Clinical Impression   1. Esophageal obstruction due to food impaction    History   Chief Complaint Dysphagia (mear stuck in throat)   HPI  Patient is a 56 y.o. male with a PMH of HIV who presents to ED for epigastric abdominal pain, states "I think I have a piece of meat stuck there," onset 4 days ago, states he was eating meat and felt like it got stuck in his throat, since then with dysphagia with sensation of food stuck in esophagus. States that he is unable to tolerate PO reporting "I spit up every time," able to handle oral secretions. Denies speech changes or sore throat. Denies fever, chills, unexplained weight loss, dizziness, vision or gait changes, facial/tongue/lip swelling, CP, SOB, cough, hematemesis, diarrhea, melena, dysuria, hematuria, extremity numbness/tingling/weakness, or any additional concerns. Denies recent NSAID use or hx of stomach ulcers.   Past Medical History:  Diagnosis Date  . HIV (human immunodeficiency virus infection) ([place])   . Pneumothorax     Past Surgical History:  Procedure Laterality Date  . CHEST TUBE INSERTION    . MULTIPLE EXTRACTIONS WITH ALVEOLOPLASTY N/A 05/23/2014   Procedure: EXTRACTIONS OF ALL REMAINING TEETH WITH ALVEOLOPLASTY, REMOVAL OF LEFT OSSEOUS EXOTOSIS, REMOVAL MAXILLARY OSSEOUS TUBEROSITY;  Surgeon: Diona Browner, DDS;  Location: [place];  Service: Oral Surgery;  Laterality: N/A;  . PLEURADESIS Left 04/26/2013   Procedure: PLEURADESIS;  Surgeon: Gaye Pollack, MD;  Location: [facility] OR;  Service: Thoracic;  Laterality: Left;  Mechanical  . STAPLING OF BLEBS Left 04/26/2013   Procedure: STAPLING OF BLEBS;  Surgeon: Gaye Pollack, MD;  Location: [place];  Service: Thoracic;  Laterality: Left;  Marland Kitchen VIDEO ASSISTED THORACOSCOPY Left 04/26/2013   Procedure: VIDEO ASSISTED THORACOSCOPY;  Surgeon: Gaye Pollack, MD;  Location: [facility] OR;  Service: Thoracic;  Laterality: Left;    Current Outpatient Rx   . Order #: [id number] Class: Normal  . Order #: [id number] Class: Normal  . Order #: [id number] Class: Normal  . Order #: [id number] Class: Normal  . Order #: [id number] Class: Historical Med  . Order #: [id number] Class: Normal    Allergies Patient has no known allergies.  No family history on file.  Social History Social History  Substance Use Topics  . Smoking status: Former Smoker    Packs/day: 0.25    Years: 0.00    Types: Cigarettes  . Smokeless tobacco: Never Used     Comment: trying to quit  . Alcohol use 0.0 oz/week     Comment: occassional    Review of Systems  Constitutional: Negative for fever, chills, or unexplained weight loss. Eyes: Negative for visual changes. ENT: Negative for nasal congestion, ear pain, or sore throat. Cardiovascular: Negative for chest pain, palpitations, or extremity swelling. Respiratory: Negative for shortness of breath or cough. Gastrointestinal: +abdominal pain, nausea, vomiting. Negative for constipation, diarrhea, hematemesis, hematochezia, or melena. Genitourinary: Negative for dysuria, urinary frequency, or hematuria. Musculoskeletal: Negative for back pain or extremity pain/swelling. Skin: Negative for rash. Neurological: Negative for headaches, dizziness, focal weakness, or numbness/tingling.  Physical Exam   VITAL SIGNS:   ED Triage Vitals  Enc Vitals Group     BP 08/29/16 0859 108/77     Pulse Rate 08/29/16 0859 (!) 108     Resp 08/29/16 0859 16     Temp 08/29/16 0859 97.9 F (36.6 C)     Temp Source 08/29/16 0859 Oral     SpO2 08/29/16 0859 94 %  Weight 08/29/16 0904 160 lb (72.6 kg)     Height 08/29/16 0904 5\' 11"  (1.803 m)     Head Circumference --      Peak Flow --      Pain Score 08/29/16 0903 0     Pain Loc --      Pain Edu? --      Excl. in [place]? --     Constitutional: Alert and oriented. Well appearing and in no respiratory apparent distress. Eyes: PERRL, EOMI, Conjunctivae normal ENT      Head:  Normocephalic and atraumatic.      Ears: TM intact bilaterally without erythema or effusion, no hemotympanum, external ear canals normal.       Nose: No congestion.      Mouth/Throat: Mucous membranes are moist. Oropharynx without erythema or exudate. No trismus. Normal voice, handling secretions normally.      Neck: Supple, no nuchal signs, full active ROM of neck.  Hematological/Lymphatic/Immunological: No cervical lymphadenopathy. Cardiovascular: Normal S1 S2, regular rhythm, normal rate. Normal and symmetric distal pulses are present in all extremities. Respiratory: Breath sounds clear and equal bilaterally. No wheezes, rales, or rhonchi. Normal respiratory effort.  Gastrointestinal: Abdomen soft, nontender. +normal BS. No rebound or guarding. Negative Murphys. There is no CVA tenderness. Back: No midline tenderness.  Musculoskeletal: Nontender with normal range of motion in all extremities. Neurologic: Speech clear. Alert and appropriate, no gross focal neurologic deficits are appreciated. Gait steady with ambulation. Equal strength in all four extremities. Extremities neurovascularly intact.  Skin: Skin is warm, dry, and intact. No rash noted. Psychiatric: Mood and affect are normal. Speech and behavior are normal.  Labs   Labs Reviewed  COMPREHENSIVE METABOLIC PANEL  LIPASE, BLOOD  CBC WITH DIFFERENTIAL/PLATELET    Radiology   No orders to display     ED Course, Assessment and Plan   Pt is a 56 y/o M, afebrile, who presents to ED for dysphagia with sensation of food stuck in esophagus, concern for esophageal ostruction vs. esophagitis vs. gastritis vs. GERD vs. pancreatitis. Will get labs for electrolyte abnormality, lipase for pancreatitis. Plan to give antiemetic and IVF. Will discuss with GI.  10:13 AM Patient given glass of water, upon drinking 2-3 sips began to vomit water back up. GI paged.   10:45 AM Discussed with GI, will come and eval patient.   1:56 PM Patient  to have EGD. Discussed with Dr. Alessandra Bevels GI, will discharge from upstairs.   The patient was discussed with Dr. Tomi Bamberger who agrees with the treatment plan.  Previous chart, nursing notes, and vital signs reviewed.    Pertinent labs & imaging results that were available during my care of the patient were reviewed by me and considered in my medical decision making (see chart for details).     Wojeck, Bernadene Bell, NP 09/02/16 2240

## 2016-08-29 NOTE — Transfer of Care (Signed)
Immediate Anesthesia Transfer of Care Note  Patient: William [name]  Procedure(s) Performed: Procedure(s): ESOPHAGOGASTRODUODENOSCOPY (EGD) WITH PROPOFOL (N/A)  Patient Location: Endoscopy Unit  Anesthesia Type:General  Level of Consciousness: awake and patient cooperative  Airway & Oxygen Therapy: Patient Spontanous Breathing  Post-op Assessment: Report given to RN and Post -op Vital signs reviewed and stable  Post vital signs: Reviewed and stable  Last Vitals:  Vitals:   08/29/16 1000 08/29/16 1315  BP: (!) 134/94 (!) 145/80  Pulse: 81 66  Resp:  14  Temp:      Last Pain:  Vitals:   08/29/16 1315  TempSrc: Oral  PainSc:          Complications: No apparent anesthesia complications

## 2016-08-29 NOTE — ED Triage Notes (Signed)
Pt. Stated, I got a piece of meat stuck in my esophagus  Since Saturday.

## 2016-09-01 ENCOUNTER — Encounter: Payer: Self-pay | Admitting: Internal Medicine

## 2016-09-01 ENCOUNTER — Encounter (HOSPITAL_COMMUNITY): Payer: Self-pay | Admitting: Gastroenterology

## 2016-09-01 DIAGNOSIS — K2100 Gastro-esophageal reflux disease with esophagitis, without bleeding: Secondary | ICD-10-CM | POA: Insufficient documentation

## 2016-09-01 NOTE — ED Provider Notes (Signed)
Medical screening examination/treatment/procedure(s) were performed by non-physician practitioner and as supervising physician I was immediately available for consultation/collaboration.     Dorie Rank, MD 09/01/16 [phone]

## 2016-09-06 ENCOUNTER — Encounter (HOSPITAL_COMMUNITY): Payer: Self-pay | Admitting: Gastroenterology

## 2016-09-06 NOTE — Addendum Note (Signed)
Addendum  created 09/06/16 1523 by Duane Boston, MD   Anesthesia Event deleted, Anesthesia Event edited, Anesthesia Staff edited

## 2016-09-15 ENCOUNTER — Ambulatory Visit (INDEPENDENT_AMBULATORY_CARE_PROVIDER_SITE_OTHER): Payer: Medicare Other | Admitting: Podiatry

## 2016-09-15 DIAGNOSIS — B351 Tinea unguium: Secondary | ICD-10-CM | POA: Diagnosis not present

## 2016-09-15 NOTE — Progress Notes (Signed)
Complaint:  Visit Type: Patient returns to my office for continued preventative foot care services. Complaint: Patient states" my nails have grown long and thick and become painful to walk and wear shoes"  The patient presents for preventative foot care services. No changes to ROS  Podiatric Exam: Vascular: dorsalis pedis and posterior tibial pulses are palpable bilateral. Capillary return is immediate. Temperature gradient is WNL. Skin turgor WNL  Sensorium: Normal Semmes Weinstein monofilament test. Normal tactile sensation bilaterally. Nail Exam: Pt has thick disfigured discolored nails with subungual debris noted bilateral entire nail hallux through fifth toenails Ulcer Exam: There is no evidence of ulcer or pre-ulcerative changes or infection. Orthopedic Exam: Muscle tone and strength are WNL. No limitations in general ROM. No crepitus or effusions noted. Foot type and digits show no abnormalities.  Severe HAV  B/L.  Skin: No Porokeratosis. No infection or ulcers.  Diagnosis:  Onychomycosis, , Pain in right toe, pain in left toes  Treatment & Plan Procedures and Treatment: Consent by patient was obtained for treatment procedures. The patient understood the discussion of treatment and procedures well. All questions were answered thoroughly reviewed. Debridement of mycotic and hypertrophic toenails, 1 through 5 bilateral and clearing of subungual debris. No ulceration, no infection noted.  Return Visit-Office Procedure: Patient instructed to return to the office for a follow up visit 4  months for continued evaluation and treatment.    Gardiner Barefoot DPM

## 2016-09-22 ENCOUNTER — Other Ambulatory Visit: Payer: Medicare Other

## 2016-09-22 ENCOUNTER — Encounter (INDEPENDENT_AMBULATORY_CARE_PROVIDER_SITE_OTHER): Payer: Self-pay | Admitting: *Deleted

## 2016-09-22 VITALS — BP 117/79 | HR 67 | Temp 98.2°F | Wt 164.5 lb

## 2016-09-22 DIAGNOSIS — Z006 Encounter for examination for normal comparison and control in clinical research program: Secondary | ICD-10-CM | POA: Diagnosis not present

## 2016-09-22 DIAGNOSIS — B20 Human immunodeficiency virus [HIV] disease: Secondary | ICD-10-CM

## 2016-09-22 LAB — COMPREHENSIVE METABOLIC PANEL WITH GFR
ALT: 25 U/L (ref 9–46)
AST: 24 U/L (ref 10–35)
Albumin: 4.2 g/dL (ref 3.6–5.1)
Alkaline Phosphatase: 58 U/L (ref 40–115)
BUN: 12 mg/dL (ref 7–25)
CO2: 23 mmol/L (ref 20–32)
Calcium: 9.2 mg/dL (ref 8.6–10.3)
Chloride: 107 mmol/L (ref 98–110)
Creat: 0.71 mg/dL (ref 0.70–1.33)
Glucose, Bld: 85 mg/dL (ref 65–99)
Potassium: 4.2 mmol/L (ref 3.5–5.3)
Sodium: 139 mmol/L (ref 135–146)
Total Bilirubin: 0.4 mg/dL (ref 0.2–1.2)
Total Protein: 7.6 g/dL (ref 6.1–8.1)

## 2016-09-22 LAB — CBC
HCT: 40.6 % (ref 38.5–50.0)
Hemoglobin: 13.3 g/dL (ref 13.2–17.1)
MCH: 27.7 pg (ref 27.0–33.0)
MCHC: 32.8 g/dL (ref 32.0–36.0)
MCV: 84.4 fL (ref 80.0–100.0)
MPV: 8.9 fL (ref 7.5–12.5)
Platelets: 227 K/uL (ref 140–400)
RBC: 4.81 MIL/uL (ref 4.20–5.80)
RDW: 15.8 % — ABNORMAL HIGH (ref 11.0–15.0)
WBC: 4.1 K/uL (ref 3.8–10.8)

## 2016-09-22 NOTE — Progress Notes (Signed)
William [name] is here for his annual visit for Reprieve, A Randomized Trial to Prevent Vascular Events in HIV (study drug is Pitavastatin 4mg  or placebo).  He says he has been doing pretty well except for having to come to the Ed in July. He said he had gotten a piece of meat stuck in his throat and finally went to get help after 2 days dealing with it. He said he had lost weight and felt miserable during that episode. His weigh is starting to come back up now. He denies any muscle aches or weakness and says his adherence is excellent with study drug. He will be seeing Dr. Megan Salon next month so we went ahead and got his labs for him at the same time we got the study labs. He will return for [place] in December. I thanked him for participating in the study and let him know how important it is for him to stay in the study.

## 2016-09-23 LAB — T-HELPER CELL (CD4) - (RCID CLINIC ONLY)
CD4 % Helper T Cell: 13 % — ABNORMAL LOW (ref 33–55)
CD4 T Cell Abs: 220 /uL — ABNORMAL LOW (ref 400–2700)

## 2016-09-23 LAB — RPR

## 2016-09-26 LAB — HIV-1 RNA QUANT-NO REFLEX-BLD
HIV 1 RNA Quant: 41 copies/mL — ABNORMAL HIGH
HIV-1 RNA Quant, Log: 1.61 Log copies/mL — ABNORMAL HIGH

## 2016-09-30 ENCOUNTER — Other Ambulatory Visit: Payer: Self-pay | Admitting: Internal Medicine

## 2016-09-30 ENCOUNTER — Other Ambulatory Visit: Payer: Self-pay | Admitting: *Deleted

## 2016-09-30 DIAGNOSIS — L739 Follicular disorder, unspecified: Secondary | ICD-10-CM

## 2016-09-30 DIAGNOSIS — B20 Human immunodeficiency virus [HIV] disease: Secondary | ICD-10-CM

## 2016-09-30 MED ORDER — ELVITEG-COBIC-EMTRICIT-TENOFAF 150-150-200-10 MG PO TABS: ORAL_TABLET | ORAL | 5 refills | Status: DC

## 2016-10-20 ENCOUNTER — Ambulatory Visit (INDEPENDENT_AMBULATORY_CARE_PROVIDER_SITE_OTHER): Payer: Medicare Other | Admitting: Internal Medicine

## 2016-10-20 ENCOUNTER — Encounter: Payer: Self-pay | Admitting: Internal Medicine

## 2016-10-20 VITALS — BP 111/73 | HR 66 | Temp 98.0°F | Wt 167.0 lb

## 2016-10-20 DIAGNOSIS — K2100 Gastro-esophageal reflux disease with esophagitis, without bleeding: Secondary | ICD-10-CM

## 2016-10-20 DIAGNOSIS — B20 Human immunodeficiency virus [HIV] disease: Secondary | ICD-10-CM | POA: Diagnosis not present

## 2016-10-20 DIAGNOSIS — F17210 Nicotine dependence, cigarettes, uncomplicated: Secondary | ICD-10-CM | POA: Diagnosis not present

## 2016-10-20 DIAGNOSIS — B182 Chronic viral hepatitis C: Secondary | ICD-10-CM

## 2016-10-20 MED ORDER — ELVITEG-COBIC-EMTRICIT-TENOFAF 150-150-200-10 MG PO TABS: ORAL_TABLET | ORAL | 5 refills | Status: DC

## 2016-10-20 NOTE — Progress Notes (Signed)
HPI: William [name] is a 56 y.o. male who is here for his HIV visit with Dr. Megan Salon.   Lab Results  Component Value Date   HCVGENOTYPE 1 03/11/2014    Allergies: No Known Allergies  Vitals: Temp: 98 F (36.7 C) (09/13 1008) Temp Source: Oral (09/13 1008) BP: 111/73 (09/13 1008) Pulse Rate: 66 (09/13 1008)  Past Medical History: Past Medical History:  Diagnosis Date  . HIV (human immunodeficiency virus infection) ([place])   . Pneumothorax     Social History: Social History   Social History  . Marital status: Married    Spouse name: N/A  . Number of children: N/A  . Years of education: N/A   Social History Main Topics  . Smoking status: Former Smoker    Packs/day: 0.25    Years: 0.00    Types: Cigarettes  . Smokeless tobacco: Never Used     Comment: trying to quit  . Alcohol use 0.0 oz/week     Comment: occassional  . Drug use: No  . Sexual activity: Yes     Comment: given condoms   Other Topics Concern  . None   Social History Narrative  . None    Labs: HIV 1 RNA Quant (copies/mL)  Date Value  09/22/2016 41 (H)  12/22/2015 <20  09/15/2015 <20   CD4 T Cell Abs (/uL)  Date Value  09/22/2016 220 (L)  12/22/2015 450  09/15/2015 320 (L)   Hep B S Ab (no units)  Date Value  03/09/2005 positive   Hepatitis B Surface Ag (no units)  Date Value  10/15/2009 NEG   HCV Ab (no units)  Date Value  09/17/2001 [positive    Lab Results  Component Value Date   HCVGENOTYPE 1 03/11/2014    Hepatitis C RNA quantitative Latest Ref Rng & Units 07/29/2014 06/05/2014 05/07/2014 03/11/2014  HCV Quantitative <15 IU/mL Not Detected Not Detected <15 138,536(H)  HCV Quantitative Log <1.18 log 10 NOT CALC NOT CALC <1.18 5.14(H)    AST (U/L)  Date Value  09/22/2016 24  08/29/2016 37  11/06/2015 20   ALT (U/L)  Date Value  09/22/2016 25  08/29/2016 26  11/06/2015 13   INR (no units)  Date Value  05/23/2014 1.04  04/25/2013 1.06  04/24/2013 1.02     CrCl: CrCl cannot be calculated (Patient's most recent lab result is older than the maximum 21 days allowed.).  Fibrosis Score: F0/1 as assessed by [facility]   Child-Pugh Score: Class A  Previous Treatment Regimen: None  Assessment: William [name] is here today to f/u with Dr. Megan Salon for his HIV. We treated William [name] for his hep C in 2016 but we never got the [facility] labs. William [name] said that he was recently got tested somewhere also and it was neg. Obviously, we don't know what was order or in the system. Therefore, we are going to get a cure hep C VL today while he is here.   William [name] was also unhappy with this "[facility]" pharmacy because often times they would not have his [name] in stock there and it would be a delay. Therefore, we will transfer his Rx to [facility]. It's closer to him now anyway. Advised him that if he wants all of his meds to be filled there, he just needs to tell [facility] pharmacy.   Recommendations:  Cont [name] 1 daily Add-on Hep C [id number]  William [name], Pharm.D., BCPS, AAHIVP Clinical Infectious [facility] 10/20/2016, 10:53 AM

## 2016-10-20 NOTE — Assessment & Plan Note (Signed)
His viral load is just barely detectable. His adherence has been excellent when he has been able to get his medication. I had him meet with our pharmacist, Onnie Boer, to see if weekend change to the [facility] outpatient pharmacy so we can monitor his refills. He will follow-up here after blood work in 6 months

## 2016-10-20 NOTE — Assessment & Plan Note (Signed)
His recent history sounds like a food impaction but he had esophagitis by exam. He is not having any acid reflux symptoms or problems swallowing now so we'll simply follow this.

## 2016-10-20 NOTE — Assessment & Plan Note (Signed)
I encouraged him to set a quit date and go through with this plan to quit smoking entirely.

## 2016-10-20 NOTE — Progress Notes (Signed)
Patient Active Problem List   Diagnosis Date Noted  . Human immunodeficiency virus (HIV) disease ([place]) 07/10/2006    Priority: High  . Chronic hepatitis C without hepatic coma ([place]) 07/10/2006    Priority: Medium  . GERD with esophagitis 09/01/2016  . Cigarette smoker 12/23/2014  . Spontaneous pneumothorax 04/24/2013  . Seasonal allergies 01/22/2013  . PNEUMOCYSTIS PNEUMONIA 07/10/2006  . ALCOHOL ABUSE, HX OF 07/10/2006    Patient's Medications  New Prescriptions   No medications on file  Previous Medications   ELVITEGRAVIR-COBICISTAT-EMTRICITABINE-TENOFOVIR ([name]) 150-150-200-10 MG TABS TABLET    take 1 tablet by mouth once daily WITH BREAKFAST   LORATADINE (CLARITIN) 10 MG TABLET    Take 1 tablet (10 mg total) by mouth daily.   NICOTINE (NICODERM CQ - DOSED IN MG/24 HOURS) 14 MG/24HR PATCH    Place 1 patch (14 mg total) onto the skin daily.   NICOTINE (NICODERM CQ - DOSED IN MG/24 HR) 7 MG/24HR PATCH    Place 1 patch (7 mg total) onto the skin daily.   PANTOPRAZOLE (PROTONIX) 40 MG TABLET    Take 1 tablet (40 mg total) by mouth 2 (two) times daily.   PITAVASTATIN CALCIUM 4 MG TABS    Take 4 mg by mouth daily. This may be placebo and is study provided. Do not fill prescription.   TRIAMCINOLONE CREAM (KENALOG) 0.1 %    apply to affected area twice a day  Modified Medications   No medications on file  Discontinued Medications   No medications on file    Subjective: William [name] is in for his routine HIV follow-up visit. He says that recently his pharmacy has been late with his medication. He says it a told him that they would have to order the medication and this caused him to miss several days. He would like to change to using our pharmacy here. He is down to 2 cigarettes daily. He continues to use the nicotine patches and feels like that helps. He has not set a quit date yet. He recently was in the emergency department because he got food stuck in his throat. He  underwent endoscopy and had some mid esophagus esophagitis. There was no evidence of fungal or viral infection and gastric biopsies were negative for H. pylori. He has no reflux symptoms and is not taking anything for acid reflux. He has had not had any problem swallowing since that day. He says that it happened because he ate too much food on his birthday.  Review of Systems: Review of Systems  Constitutional: Negative for chills, diaphoresis, fever, malaise/fatigue and weight loss.  HENT: Negative for sore throat.   Respiratory: Negative for cough, sputum production and shortness of breath.   Cardiovascular: Negative for chest pain.  Gastrointestinal: Negative for abdominal pain, diarrhea, heartburn, nausea and vomiting.  Genitourinary: Negative for dysuria and frequency.  Musculoskeletal: Negative for joint pain and myalgias.  Skin: Negative for rash.  Neurological: Negative for dizziness and headaches.  Psychiatric/Behavioral: Negative for depression and substance abuse. The patient is not nervous/anxious.     Past Medical History:  Diagnosis Date  . HIV (human immunodeficiency virus infection) ([place])   . Pneumothorax     Social History  Substance Use Topics  . Smoking status: Former Smoker    Packs/day: 0.25    Years: 0.00    Types: Cigarettes  . Smokeless tobacco: Never Used     Comment: trying to quit  . Alcohol  use 0.0 oz/week     Comment: occassional    No family history on file.  No Known Allergies  Objective:  Vitals:   10/20/16 1008  BP: 111/73  Pulse: 66  Temp: 98 F (36.7 C)  TempSrc: Oral  Weight: 167 lb (75.8 kg)   Body mass index is 23.29 kg/m.  Physical Exam  Constitutional: He is oriented to person, place, and time.  He is in good spirits.  HENT:  Mouth/Throat: No oropharyngeal exudate.  Eyes: Conjunctivae are normal.  Cardiovascular: Normal rate and regular rhythm.   No murmur heard. Pulmonary/Chest: Effort normal and breath sounds  normal. He has no wheezes. He has no rales.  Abdominal: Soft. He exhibits no mass. There is no tenderness.  Musculoskeletal: Normal range of motion.  Neurological: He is alert and oriented to person, place, and time.  Skin: No rash noted.  Psychiatric: Mood and affect normal.    Lab Results Lab Results  Component Value Date   WBC 4.1 09/22/2016   HGB 13.3 09/22/2016   HCT 40.6 09/22/2016   MCV 84.4 09/22/2016   PLT 227 09/22/2016    Lab Results  Component Value Date   CREATININE 0.71 09/22/2016   BUN 12 09/22/2016   NA 139 09/22/2016   K 4.2 09/22/2016   CL 107 09/22/2016   CO2 23 09/22/2016    Lab Results  Component Value Date   ALT 25 09/22/2016   AST 24 09/22/2016   ALKPHOS 58 09/22/2016   BILITOT 0.4 09/22/2016    Lab Results  Component Value Date   CHOL 178 09/24/2015   HDL 69 09/24/2015   LDLCALC 98 09/24/2015   TRIG 57 09/24/2015   CHOLHDL 2.6 09/24/2015   Lab Results  Component Value Date   LABRPR NON REAC 09/22/2016   HIV 1 RNA Quant (copies/mL)  Date Value  09/22/2016 41 (H)  12/22/2015 <20  09/15/2015 <20   CD4 T Cell Abs (/uL)  Date Value  09/22/2016 220 (L)  12/22/2015 450  09/15/2015 320 (L)     Problem List Items Addressed This Visit      High   Human immunodeficiency virus (HIV) disease ([place])    His viral load is just barely detectable. His adherence has been excellent when he has been able to get his medication. I had him meet with our pharmacist, Onnie Boer, to see if weekend change to the [facility] outpatient pharmacy so we can monitor his refills. He will follow-up here after blood work in 6 months      Relevant Orders   T-helper cell (CD4)- ([facility] clinic only)   HIV 1 RNA quant-no reflex-bld   CBC   Comprehensive metabolic panel   RPR     Unprioritized   Cigarette smoker    I encouraged him to set a quit date and go through with this plan to quit smoking entirely.      GERD with esophagitis    His recent history sounds like a  food impaction but he had esophagitis by exam. He is not having any acid reflux symptoms or problems swallowing now so we'll simply follow this.           Michel Bickers, MD [facility] for [facility] [phone] pager   [phone] cell 10/20/2016, 10:34 AM

## 2016-10-24 LAB — HEPATITIS C RNA QUANTITATIVE
HCV Quantitative Log: <1.18 Log IU/mL
HCV RNA, PCR, QN: <15 IU/mL

## 2016-11-04 ENCOUNTER — Encounter (HOSPITAL_COMMUNITY): Payer: Self-pay | Admitting: Emergency Medicine

## 2016-11-04 ENCOUNTER — Emergency Department (HOSPITAL_COMMUNITY)
Admission: EM | Admit: 2016-11-04 | Discharge: 2016-11-04 | Disposition: A | Discharge status: Home / Self Care | Payer: Medicare Other | Attending: Emergency Medicine | Admitting: Emergency Medicine

## 2016-11-04 DIAGNOSIS — R440 Auditory hallucinations: Secondary | ICD-10-CM | POA: Diagnosis not present

## 2016-11-04 DIAGNOSIS — Z046 Encounter for general psychiatric examination, requested by authority: Secondary | ICD-10-CM | POA: Insufficient documentation

## 2016-11-04 DIAGNOSIS — F1490 Cocaine use, unspecified, uncomplicated: Secondary | ICD-10-CM | POA: Insufficient documentation

## 2016-11-04 DIAGNOSIS — F17210 Nicotine dependence, cigarettes, uncomplicated: Secondary | ICD-10-CM | POA: Insufficient documentation

## 2016-11-04 DIAGNOSIS — Z008 Encounter for other general examination: Secondary | ICD-10-CM

## 2016-11-04 DIAGNOSIS — D649 Anemia, unspecified: Secondary | ICD-10-CM | POA: Diagnosis not present

## 2016-11-04 DIAGNOSIS — F1010 Alcohol abuse, uncomplicated: Secondary | ICD-10-CM | POA: Diagnosis not present

## 2016-11-04 DIAGNOSIS — Z720 Tobacco use: Secondary | ICD-10-CM

## 2016-11-04 DIAGNOSIS — Z79899 Other long term (current) drug therapy: Secondary | ICD-10-CM | POA: Diagnosis not present

## 2016-11-04 LAB — RAPID URINE DRUG SCREEN, HOSP PERFORMED
Amphetamines: NOT DETECTED
Barbiturates: NOT DETECTED
Benzodiazepines: NOT DETECTED
Cocaine: NOT DETECTED
Opiates: NOT DETECTED
Tetrahydrocannabinol: NOT DETECTED

## 2016-11-04 LAB — COMPREHENSIVE METABOLIC PANEL
ALT: 19 U/L (ref 17–63)
AST: 19 U/L (ref 15–41)
Albumin: 3.9 g/dL (ref 3.5–5.0)
Alkaline Phosphatase: 69 U/L (ref 38–126)
Anion gap: 8 (ref 5–15)
BUN: 17 mg/dL (ref 6–20)
CO2: 29 mmol/L (ref 22–32)
Calcium: 9 mg/dL (ref 8.9–10.3)
Chloride: 106 mmol/L (ref 101–111)
Creatinine, Ser: 0.83 mg/dL (ref 0.61–1.24)
GFR calc Af Amer: >60 mL/min (ref >60)
GFR calc non Af Amer: >60 mL/min (ref >60)
Glucose, Bld: 119 mg/dL — ABNORMAL HIGH (ref 65–99)
Potassium: 3.8 mmol/L (ref 3.5–5.1)
Sodium: 143 mmol/L (ref 135–145)
Total Bilirubin: 0.3 mg/dL (ref 0.3–1.2)
Total Protein: 8.5 g/dL — ABNORMAL HIGH (ref 6.5–8.1)

## 2016-11-04 LAB — URINALYSIS, ROUTINE W REFLEX MICROSCOPIC
Bilirubin Urine: NEGATIVE
Glucose, UA: NEGATIVE mg/dL
Hgb urine dipstick: NEGATIVE
Ketones, ur: NEGATIVE mg/dL
Leukocytes, UA: NEGATIVE
Nitrite: NEGATIVE
Protein, ur: NEGATIVE mg/dL
Specific Gravity, Urine: 1.026 (ref 1.005–1.030)
pH: 6 (ref 5.0–8.0)

## 2016-11-04 LAB — ETHANOL: Alcohol, Ethyl (B): <10 mg/dL (ref <10)

## 2016-11-04 LAB — CBC
HCT: 35.3 % — ABNORMAL LOW (ref 39.0–52.0)
Hemoglobin: 11.9 g/dL — ABNORMAL LOW (ref 13.0–17.0)
MCH: 28.5 pg (ref 26.0–34.0)
MCHC: 33.7 g/dL (ref 30.0–36.0)
MCV: 84.7 fL (ref 78.0–100.0)
Platelets: 286 10*3/uL (ref 150–400)
RBC: 4.17 MIL/uL — ABNORMAL LOW (ref 4.22–5.81)
RDW: 14.8 % (ref 11.5–15.5)
WBC: 5.8 10*3/uL (ref 4.0–10.5)

## 2016-11-04 NOTE — ED Triage Notes (Signed)
Pt was seen at [facility] to be placed in a drug and alcohol rehab program and was sent here for labs, medical clearance  If pt is cleared he may return to their facility

## 2016-11-04 NOTE — ED Provider Notes (Signed)
[facility] DEPT Provider Note   CSN: [id number] Arrival date & time: 11/04/16  5397     History   Chief Complaint Chief Complaint  Patient presents with  . Medical Clearance    HPI William [name] is a 56 y.o. male with a PMHx of HIV (last CD4 count 220 on 09/22/16), GERD, HepC, alcoholism, and tobacco user, who presents to the ED sent in from [facility] for medical clearance, with a form that requests CBC, CMP, ethanol level, UDS, and UA. Patient states that he went to [facility] in order to "get dried out" office alcohol, states he is upset with himself that he "let alcohol creep back into (his) life". He reports that he consumes at least 1 gallon of alcohol a day, states it's hard to estimate because he is drinking alcohol "nonstop all day"; reports that he had a pint of vodka last night and that was his last alcohol consumption. He admits that he used crack cocaine 2 weeks ago, but denies any other illicit drug use. Endorses auditory hallucinations in the form of hearing voices "all the time". Denies SI, HI, or visual hallucinations. Denies any other medical complaints at this time. He admits to being a cigarette smoker. He is compliant with his [name]; follows with the infectious disease clinic for his care. Has no other medications except kenalog cream and genvoya.    The history is provided by the patient and medical records. No language interpreter was used.  Mental Health Problem  Presenting symptoms: hallucinations   Presenting symptoms: no homicidal ideas and no suicidal thoughts   Onset quality:  Gradual Timing:  Constant Progression:  Unchanged Chronicity:  Recurrent Context: alcohol use and drug abuse   Treatment compliance:  Untreated Relieved by:  None tried Worsened by:  Drugs and alcohol Ineffective treatments:  None tried Associated symptoms: no abdominal pain and no chest pain     Past Medical History:  Diagnosis Date  . HIV (human immunodeficiency virus  infection) ([place])   . Pneumothorax     Patient Active Problem List   Diagnosis Date Noted  . GERD with esophagitis 09/01/2016  . Cigarette smoker 12/23/2014  . Spontaneous pneumothorax 04/24/2013  . Seasonal allergies 01/22/2013  . Human immunodeficiency virus (HIV) disease ([place]) 07/10/2006  . Chronic hepatitis C without hepatic coma ([place]) 07/10/2006  . PNEUMOCYSTIS PNEUMONIA 07/10/2006  . ALCOHOL ABUSE, HX OF 07/10/2006    Past Surgical History:  Procedure Laterality Date  . CHEST TUBE INSERTION    . ESOPHAGOGASTRODUODENOSCOPY (EGD) WITH PROPOFOL N/A 08/29/2016   Procedure: ESOPHAGOGASTRODUODENOSCOPY (EGD) WITH PROPOFOL;  Surgeon: Otis Brace, MD;  Location: [facility];  Service: Gastroenterology;  Laterality: N/A;  . MULTIPLE EXTRACTIONS WITH ALVEOLOPLASTY N/A 05/23/2014   Procedure: EXTRACTIONS OF ALL REMAINING TEETH WITH ALVEOLOPLASTY, REMOVAL OF LEFT OSSEOUS EXOTOSIS, REMOVAL MAXILLARY OSSEOUS TUBEROSITY;  Surgeon: Diona Browner, DDS;  Location: [facility];  Service: Oral Surgery;  Laterality: N/A;  . PLEURADESIS Left 04/26/2013   Procedure: PLEURADESIS;  Surgeon: Gaye Pollack, MD;  Location: [facility] OR;  Service: Thoracic;  Laterality: Left;  Mechanical  . STAPLING OF BLEBS Left 04/26/2013   Procedure: STAPLING OF BLEBS;  Surgeon: Gaye Pollack, MD;  Location: [facility];  Service: Thoracic;  Laterality: Left;  Marland Kitchen VIDEO ASSISTED THORACOSCOPY Left 04/26/2013   Procedure: VIDEO ASSISTED THORACOSCOPY;  Surgeon: Gaye Pollack, MD;  Location: [facility] OR;  Service: Thoracic;  Laterality: Left;       Home Medications    Prior to Admission  medications   Medication Sig Start Date End Date Taking? Authorizing Provider  elvitegravir-cobicistat-emtricitabine-tenofovir ([name]) 150-150-200-10 MG TABS tablet take 1 tablet by mouth once daily WITH BREAKFAST 10/20/16   Michel Bickers, MD  loratadine (CLARITIN) 10 MG tablet Take 1 tablet (10 mg total) by mouth daily. 06/02/15   Michel Bickers, MD    nicotine (NICODERM CQ - DOSED IN MG/24 HOURS) 14 mg/24hr patch Place 1 patch (14 mg total) onto the skin daily. Patient not taking: Reported on 07/19/2016 06/02/15   Michel Bickers, MD  nicotine (NICODERM CQ - DOSED IN MG/24 HR) 7 mg/24hr patch Place 1 patch (7 mg total) onto the skin daily. Patient not taking: Reported on 07/19/2016 09/15/15   Michel Bickers, MD  pantoprazole (PROTONIX) 40 MG tablet Take 1 tablet (40 mg total) by mouth 2 (two) times daily. Patient not taking: Reported on 10/20/2016 08/29/16   Otis Brace, MD  Pitavastatin Calcium 4 MG TABS Take 4 mg by mouth daily. This may be placebo and is study provided. Do not fill prescription. 10/01/15   [provider]  triamcinolone cream (KENALOG) 0.1 % apply to affected area twice a day 09/30/16   Michel Bickers, MD    Family History Family History  Problem Relation Age of Onset  . Hypertension Other     Social History Social History  Substance Use Topics  . Smoking status: Current Every Day Smoker    Packs/day: 0.25    Years: 0.00    Types: Cigarettes  . Smokeless tobacco: Never Used     Comment: trying to quit  . Alcohol use 0.0 oz/week     Comment: daily     Allergies   Patient has no known allergies.   Review of Systems Review of Systems  Constitutional: Negative for chills and fever.  Respiratory: Negative for shortness of breath.   Cardiovascular: Negative for chest pain.  Gastrointestinal: Negative for abdominal pain, constipation, diarrhea, nausea and vomiting.  Genitourinary: Negative for dysuria and hematuria.  Musculoskeletal: Negative for arthralgias and myalgias.  Skin: Negative for color change.  Allergic/Immunologic: Positive for immunocompromised state (HIV).  Neurological: Negative for weakness and numbness.  Psychiatric/Behavioral: Positive for hallucinations. Negative for confusion, homicidal ideas and suicidal ideas.       +EtOH use   All other systems reviewed and are negative  for acute change except as noted in the HPI.    Physical Exam Updated Vital Signs BP (!) 134/92 (BP Location: Left Arm)   Pulse 75   Temp 97.7 F (36.5 C) (Oral)   Resp 18   Ht 5\' 11"  (1.803 m)   Wt 74.8 kg (165 lb)   SpO2 99%   BMI 23.01 kg/m   Physical Exam  Constitutional: He is oriented to person, place, and time. Vital signs are normal. He appears well-developed and well-nourished.  Non-toxic appearance. No distress.  Afebrile, nontoxic, NAD  HENT:  Head: Normocephalic and atraumatic.  Mouth/Throat: Oropharynx is clear and moist and mucous membranes are normal.  Eyes: Conjunctivae and EOM are normal. Right eye exhibits no discharge. Left eye exhibits no discharge.  Neck: Normal range of motion. Neck supple.  Cardiovascular: Normal rate, regular rhythm, normal heart sounds and intact distal pulses.  Exam reveals no gallop and no friction rub.   No murmur heard. Pulmonary/Chest: Effort normal and breath sounds normal. No respiratory distress. He has no decreased breath sounds. He has no wheezes. He has no rhonchi. He has no rales.  Abdominal: Soft. Normal appearance and bowel  sounds are normal. He exhibits no distension. There is no tenderness. There is no rigidity, no rebound, no guarding, no CVA tenderness, no tenderness at McBurney's point and negative Murphy's sign.  Musculoskeletal: Normal range of motion.  Neurological: He is alert and oriented to person, place, and time. He has normal strength. No sensory deficit.  Skin: Skin is warm, dry and intact. No rash noted.  Psychiatric: He has a normal mood and affect. He is actively hallucinating. He expresses no homicidal and no suicidal ideation. He expresses no suicidal plans and no homicidal plans.  Pleasant and cooperative. Endorsing auditory hallucinations, denies SI, HI, or visual hallucinations, doesn't seem to be responding to internal stimuli.   Nursing note and vitals reviewed.    ED Treatments / Results  Labs (all  labs ordered are listed, but only abnormal results are displayed) Labs Reviewed  COMPREHENSIVE METABOLIC PANEL - Abnormal; Notable for the following:       Result Value   Glucose, Bld 119 (*)    Total Protein 8.5 (*)    All other components within normal limits  CBC - Abnormal; Notable for the following:    RBC 4.17 (*)    Hemoglobin 11.9 (*)    HCT 35.3 (*)    All other components within normal limits  ETHANOL  RAPID URINE DRUG SCREEN, HOSP PERFORMED  URINALYSIS, ROUTINE W REFLEX MICROSCOPIC    EKG  EKG Interpretation None       Radiology No results found.  Procedures Procedures (including critical care time)  Medications Ordered in ED Medications - No data to display   Initial Impression / Assessment and Plan / ED Course  I have reviewed the triage vital signs and the nursing notes.  Pertinent labs & imaging results that were available during my care of the patient were reviewed by me and considered in my medical decision making (see chart for details).     56 y.o.  male here for medical clearance, sent by [facility] for basic labs in order to have him med cleared (of note, did not request APAP/salicylate testing, hence not done), and then they would accept him back for rehab/detox. He has started abusing alcohol again, and admits to using crack 2wks ago. Upset that "alcohol has crept into" his life, wanting to get "dried up". +Smoker, cessation advised. Denies SI/HI, or visual hallucinations, states he's having auditory hallucinations. Denies any medical complaints, compliant with his HIV meds. Will get requested labs and U/A in order to provide med clearance for transfer back to [facility]. Will reassess shortly  8:36 PM CBC with mild anemia which is somewhat new compared to prior; advised f/up with PCP, this is likely due to alcohol abuse; doubt need for further emergent work up of this. CMP WNL. EtOH level undetectable. U/A unremarkable. UDS negative. Pt medically cleared  to go back to [facility] for ongoing management of his alcohol detox/rehab. He knows to f/up with his PCP whenever he's released, in order to f/up on his mild anemia found on labs today; also advised EtOH and tobacco cessation. I explained the diagnosis and have given explicit precautions to return to the ER including for any other new or worsening symptoms. The patient understands and accepts the medical plan as it's been dictated and I have answered their questions. Discharge instructions concerning follow up care and prescriptions have been given. The patient is STABLE and is discharged to [facility] in good condition.    Final Clinical Impressions(s) / ED Diagnoses  Final diagnoses:  Medical clearance for psychiatric admission  Crack cocaine use  Alcohol abuse  Tobacco user  Auditory hallucinations  Anemia, unspecified type    New Prescriptions New Prescriptions   No medications on [address], [place], [place] 11/04/16 2037    Fatima Blank, MD 11/04/16 [phone]

## 2016-11-04 NOTE — Discharge Instructions (Signed)
Go to [facility] for your alcohol rehabilitation. After you're released from there, follow up with your regular doctor for ongoing evaluation and management of your mild anemia found on labs today. Continue taking all of your usual home medications. Stop drinking and stop smoking cigarettes.

## 2016-11-10 ENCOUNTER — Other Ambulatory Visit: Payer: Self-pay | Admitting: Pharmacist

## 2016-12-03 ENCOUNTER — Other Ambulatory Visit: Payer: Self-pay | Admitting: Internal Medicine

## 2016-12-03 DIAGNOSIS — B20 Human immunodeficiency virus [HIV] disease: Secondary | ICD-10-CM

## 2016-12-08 ENCOUNTER — Telehealth: Payer: Self-pay | Admitting: *Deleted

## 2016-12-08 NOTE — Telephone Encounter (Signed)
Received call from nurse at [facility]. Patient is currently there, need refills of [name]. RN confirmed insurance (Medicare/medicaid), advised last refill was sent to [facility] in [place], [place]. [facility] will contact that pharmacy for his [name]. Landis Gandy, RN

## 2017-01-18 ENCOUNTER — Ambulatory Visit: Payer: Medicare Other | Admitting: Podiatry

## 2017-01-24 ENCOUNTER — Encounter: Payer: Medicare Other | Admitting: Licensed Clinical Social Worker

## 2017-02-06 ENCOUNTER — Other Ambulatory Visit: Payer: Self-pay | Admitting: Licensed Clinical Social Worker

## 2017-02-06 ENCOUNTER — Encounter: Payer: Medicare Other | Admitting: Licensed Clinical Social Worker

## 2017-02-06 ENCOUNTER — Encounter: Payer: Self-pay | Admitting: Licensed Clinical Social Worker

## 2017-02-06 VITALS — BP 145/85 | HR 65 | Temp 97.7°F | Wt 185.5 lb

## 2017-02-06 DIAGNOSIS — Z006 Encounter for examination for normal comparison and control in clinical research program: Secondary | ICD-10-CM

## 2017-02-06 DIAGNOSIS — B20 Human immunodeficiency virus [HIV] disease: Secondary | ICD-10-CM

## 2017-02-06 DIAGNOSIS — L739 Follicular disorder, unspecified: Secondary | ICD-10-CM

## 2017-02-06 MED ORDER — TRIAMCINOLONE ACETONIDE 0.1 % EX CREA: TOPICAL_CREAM | CUTANEOUS | 2 refills | Status: DC

## 2017-02-06 MED ORDER — ELVITEG-COBIC-EMTRICIT-TENOFAF 150-150-200-10 MG PO TABS: ORAL_TABLET | ORAL | 4 refills | Status: DC

## 2017-02-06 MED FILL — TRIAMCINOLONE ACETONIDE 0.1: 0.1 | 15 days supply | Qty: 30 | Fill #0

## 2017-02-06 MED FILL — GENVOYA TABLET: 150-150-200 | 30 days supply | Qty: 30 | Fill #0

## 2017-02-06 NOTE — Progress Notes (Signed)
William [name] is here today for his month 16 visit and states that he is doing very well. No pain, or muscle aches. He states that he takes his medications everyday. He returns on May 30 2017 at 11:00am.

## 2017-03-07 DIAGNOSIS — F250 Schizoaffective disorder, bipolar type: Secondary | ICD-10-CM | POA: Diagnosis not present

## 2017-03-08 MED FILL — GENVOYA TABLET: 150-150-200 | 30 days supply | Qty: 30 | Fill #1

## 2017-04-04 ENCOUNTER — Other Ambulatory Visit: Payer: Medicare Other

## 2017-04-04 DIAGNOSIS — B20 Human immunodeficiency virus [HIV] disease: Secondary | ICD-10-CM

## 2017-04-05 ENCOUNTER — Other Ambulatory Visit: Payer: Medicare Other

## 2017-04-05 LAB — T-HELPER CELL (CD4) - (RCID CLINIC ONLY)
CD4 % Helper T Cell: 12 % — ABNORMAL LOW (ref 33–55)
CD4 T Cell Abs: 280 /uL — ABNORMAL LOW (ref 400–2700)

## 2017-04-06 LAB — COMPREHENSIVE METABOLIC PANEL
AG Ratio: 1.3 (calc) (ref 1.0–2.5)
ALT: 31 U/L (ref 9–46)
AST: 37 U/L — ABNORMAL HIGH (ref 10–35)
Albumin: 4.7 g/dL (ref 3.6–5.1)
Alkaline phosphatase (APISO): 65 U/L (ref 40–115)
BUN: 14 mg/dL (ref 7–25)
CO2: 28 mmol/L (ref 20–32)
Calcium: 9.6 mg/dL (ref 8.6–10.3)
Chloride: 104 mmol/L (ref 98–110)
Creat: 0.94 mg/dL (ref 0.70–1.33)
Globulin: 3.6 g/dL (calc) (ref 1.9–3.7)
Glucose, Bld: 96 mg/dL (ref 65–99)
Potassium: 4.1 mmol/L (ref 3.5–5.3)
Sodium: 139 mmol/L (ref 135–146)
Total Bilirubin: 0.5 mg/dL (ref 0.2–1.2)
Total Protein: 8.3 g/dL — ABNORMAL HIGH (ref 6.1–8.1)

## 2017-04-06 LAB — RPR TITER: RPR Titer: 1:1 {titer} — ABNORMAL HIGH

## 2017-04-06 LAB — HIV-1 RNA QUANT-NO REFLEX-BLD
HIV 1 RNA Quant: <20 copies/mL — AB
HIV-1 RNA Quant, Log: <1.3 Log copies/mL — AB

## 2017-04-06 LAB — CBC
HCT: 42.1 % (ref 38.5–50.0)
Hemoglobin: 14.1 g/dL (ref 13.2–17.1)
MCH: 28.1 pg (ref 27.0–33.0)
MCHC: 33.5 g/dL (ref 32.0–36.0)
MCV: 84 fL (ref 80.0–100.0)
MPV: 9.6 fL (ref 7.5–12.5)
Platelets: 254 10*3/uL (ref 140–400)
RBC: 5.01 10*6/uL (ref 4.20–5.80)
RDW: 13.3 % (ref 11.0–15.0)
WBC: 6.4 10*3/uL (ref 3.8–10.8)

## 2017-04-06 LAB — FLUORESCENT TREPONEMAL AB(FTA)-IGG-BLD: Fluorescent Treponemal ABS: REACTIVE — AB

## 2017-04-06 LAB — RPR: RPR Ser Ql: REACTIVE — AB

## 2017-04-20 ENCOUNTER — Encounter: Payer: Self-pay | Admitting: Internal Medicine

## 2017-04-20 ENCOUNTER — Ambulatory Visit (INDEPENDENT_AMBULATORY_CARE_PROVIDER_SITE_OTHER): Payer: Medicare Other | Admitting: Internal Medicine

## 2017-04-20 DIAGNOSIS — K2100 Gastro-esophageal reflux disease with esophagitis, without bleeding: Secondary | ICD-10-CM

## 2017-04-20 DIAGNOSIS — B20 Human immunodeficiency virus [HIV] disease: Secondary | ICD-10-CM | POA: Diagnosis present

## 2017-04-20 DIAGNOSIS — F17210 Nicotine dependence, cigarettes, uncomplicated: Secondary | ICD-10-CM

## 2017-04-20 DIAGNOSIS — F1021 Alcohol dependence, in remission: Secondary | ICD-10-CM

## 2017-04-20 MED FILL — TRIAMCINOLONE 0.1% CREAM: 0.1 | 15 days supply | Qty: 30 | Fill #1

## 2017-04-20 MED FILL — GENVOYA TABLET: 150-150-200 | 30 days supply | Qty: 30 | Fill #0

## 2017-04-20 NOTE — Assessment & Plan Note (Signed)
He is not having any problems with acid reflux and is not requiring to take his proton pump inhibitor currently.

## 2017-04-20 NOTE — Progress Notes (Signed)
Patient Active Problem List   Diagnosis Date Noted  . Human immunodeficiency virus (HIV) disease ([place]) 07/10/2006    Priority: High  . Chronic hepatitis C without hepatic coma ([place]) 07/10/2006    Priority: Medium  . GERD with esophagitis 09/01/2016  . Cigarette smoker 12/23/2014  . Spontaneous pneumothorax 04/24/2013  . Seasonal allergies 01/22/2013  . PNEUMOCYSTIS PNEUMONIA 07/10/2006  . ALCOHOL ABUSE, HX OF 07/10/2006    Patient's Medications  New Prescriptions   No medications on file  Previous Medications   ELVITEGRAVIR-COBICISTAT-EMTRICITABINE-TENOFOVIR ([name]) 150-150-200-10 MG TABS TABLET    TAKE 1 TABLET BY MOUTH EVERY DAY WITH BREAKFAST   LORATADINE (CLARITIN) 10 MG TABLET    Take 1 tablet (10 mg total) by mouth daily.   NICOTINE (NICODERM CQ - DOSED IN MG/24 HOURS) 14 MG/24HR PATCH    Place 1 patch (14 mg total) onto the skin daily.   NICOTINE (NICODERM CQ - DOSED IN MG/24 HR) 7 MG/24HR PATCH    Place 1 patch (7 mg total) onto the skin daily.   PANTOPRAZOLE (PROTONIX) 40 MG TABLET    Take 1 tablet (40 mg total) by mouth 2 (two) times daily.   PITAVASTATIN CALCIUM 4 MG TABS    Take 4 mg by mouth daily. This may be placebo and is study provided. Do not fill prescription.   TRIAMCINOLONE CREAM (KENALOG) 0.1 %    apply to affected area twice a day  Modified Medications   No medications on file  Discontinued Medications   No medications on file    Subjective: William [name] is in for his routine HIV follow-up visit.  He is feeling well.  He just moved into a new apartment on [address].  He has had no problems obtaining, taking or tolerating his [name].  He did miss 2 weeks of it when he went into [facility] drug treatment last October.  He says that he knew he had developed a problem and was drinking too much alcohol.  He has not had anything to drink since he left.  He attends AA meetings twice each week.  He is trying to cut down on his cigarettes.  He has not had  any problems with his seasonal allergies or acid reflux.  Review of Systems: Review of Systems  Constitutional: Negative for chills, diaphoresis, fever, malaise/fatigue and weight loss.  HENT: Negative for sore throat.   Respiratory: Negative for cough, sputum production and shortness of breath.   Cardiovascular: Negative for chest pain.  Gastrointestinal: Negative for abdominal pain, diarrhea, heartburn, nausea and vomiting.  Genitourinary: Negative for dysuria and frequency.  Musculoskeletal: Negative for joint pain and myalgias.  Skin: Negative for rash.  Neurological: Negative for dizziness and headaches.  Psychiatric/Behavioral: Negative for depression and substance abuse. The patient is not nervous/anxious.     Past Medical History:  Diagnosis Date  . HIV (human immunodeficiency virus infection) ([place])   . Pneumothorax     Social History   Tobacco Use  . Smoking status: Current Every Day Smoker    Packs/day: 0.25    Years: 0.00    Pack years: 0.00    Types: Cigarettes  . Smokeless tobacco: Never Used  . Tobacco comment: trying to quit  Substance Use Topics  . Alcohol use: Yes    Alcohol/week: 0.0 oz    Comment: daily  . Drug use: Yes    Types: Cocaine    Comment: last used 2 weeks ago  Family History  Problem Relation Age of Onset  . Hypertension Other     No Known Allergies  Health Maintenance  Topic Date Due  . TETANUS/TDAP  08/26/1981  . COLONOSCOPY  08/26/2012  . INFLUENZA VACCINE  09/07/2016  . Hepatitis C Screening  Completed  . HIV Screening  Completed    Objective:  Vitals:   04/20/17 0945  BP: (!) 138/93  Pulse: 80  Temp: (!) 97.4 F (36.3 C)  TempSrc: Oral  Weight: 186 lb 8 oz (84.6 kg)  Height: 5\' 11"  (1.803 m)   Body mass index is 26.01 kg/m.  Physical Exam  Constitutional: He is oriented to person, place, and time.  He is happy and in good spirits today.  HENT:  Mouth/Throat: No oropharyngeal exudate.  Eyes:  Conjunctivae are normal.  Cardiovascular: Normal rate and regular rhythm.  No murmur heard. Pulmonary/Chest: Effort normal and breath sounds normal.  Abdominal: Soft. He exhibits no mass. There is no tenderness.  Musculoskeletal: Normal range of motion.  Neurological: He is alert and oriented to person, place, and time.  Skin: No rash noted.  Psychiatric: Mood and affect normal.    Lab Results Lab Results  Component Value Date   WBC 6.4 04/04/2017   HGB 14.1 04/04/2017   HCT 42.1 04/04/2017   MCV 84.0 04/04/2017   PLT 254 04/04/2017    Lab Results  Component Value Date   CREATININE 0.94 04/04/2017   BUN 14 04/04/2017   NA 139 04/04/2017   K 4.1 04/04/2017   CL 104 04/04/2017   CO2 28 04/04/2017    Lab Results  Component Value Date   ALT 31 04/04/2017   AST 37 (H) 04/04/2017   ALKPHOS 69 11/04/2016   BILITOT 0.5 04/04/2017    Lab Results  Component Value Date   CHOL 178 09/24/2015   HDL 69 09/24/2015   LDLCALC 98 09/24/2015   TRIG 57 09/24/2015   CHOLHDL 2.6 09/24/2015   Lab Results  Component Value Date   LABRPR REACTIVE (A) 04/04/2017   RPRTITER 1:1 (H) 04/04/2017   HIV 1 RNA Quant (copies/mL)  Date Value  04/04/2017 <20 DETECTED (A)  09/22/2016 41 (H)  12/22/2015 <20   CD4 T Cell Abs (/uL)  Date Value  04/04/2017 280 (L)  09/22/2016 220 (L)  12/22/2015 450     Problem List Items Addressed This Visit      High   Human immunodeficiency virus (HIV) disease ([place])    His infection is under very good control.  He will continue [name] and follow-up after lab work in 6 months.      Relevant Orders   T-helper cell (CD4)- ([facility] clinic only)   HIV 1 RNA quant-no reflex-bld     Unprioritized   ALCOHOL ABUSE, HX OF    I congratulated him on his sobriety.  I encouraged him to continue attending [place] meetings.      Cigarette smoker    I talked him about a plan to quit smoking.  He says he will make 05/01/2017 his quit date.  That is his wedding  anniversary.      GERD with esophagitis    He is not having any problems with acid reflux and is not requiring to take his proton pump inhibitor currently.           Michel Bickers, MD [facility] for Infectious [facility] [phone] pager   [phone] cell 04/20/2017, 10:21 AM

## 2017-04-20 NOTE — Assessment & Plan Note (Signed)
I congratulated him on his sobriety.  I encouraged him to continue attending [place] meetings.

## 2017-04-20 NOTE — Assessment & Plan Note (Signed)
I talked him about a plan to quit smoking.  He says he will make 05/01/2017 his quit date.  That is his wedding anniversary.

## 2017-04-20 NOTE — Assessment & Plan Note (Signed)
His infection is under very good control.  He will continue [name] and follow-up after lab work in 6 months.

## 2017-05-17 ENCOUNTER — Ambulatory Visit (INDEPENDENT_AMBULATORY_CARE_PROVIDER_SITE_OTHER): Payer: Medicare Other | Admitting: Podiatry

## 2017-05-17 ENCOUNTER — Encounter: Payer: Self-pay | Admitting: Podiatry

## 2017-05-17 DIAGNOSIS — M79676 Pain in unspecified toe(s): Secondary | ICD-10-CM | POA: Diagnosis not present

## 2017-05-17 DIAGNOSIS — B351 Tinea unguium: Secondary | ICD-10-CM

## 2017-05-17 NOTE — Progress Notes (Signed)
Complaint:  Visit Type: Patient returns to my office for continued preventative foot care services. Complaint: Patient states" my nails have grown long and thick and become painful to walk and wear shoes"  The patient presents for preventative foot care services. No changes to ROS  Podiatric Exam: Vascular: dorsalis pedis and posterior tibial pulses are palpable bilateral. Capillary return is immediate. Temperature gradient is WNL. Skin turgor WNL  Sensorium: Normal Semmes Weinstein monofilament test. Normal tactile sensation bilaterally. Nail Exam: Pt has thick disfigured discolored nails with subungual debris noted bilateral entire nail hallux through fifth toenails Ulcer Exam: There is no evidence of ulcer or pre-ulcerative changes or infection. Orthopedic Exam: Muscle tone and strength are WNL. No limitations in general ROM. No crepitus or effusions noted. Foot type and digits show no abnormalities.  Severe HAV  B/L.  Skin: No Porokeratosis. No infection or ulcers.  Diagnosis:  Onychomycosis, , Pain in right toe, pain in left toes  Treatment & Plan Procedures and Treatment: Consent by patient was obtained for treatment procedures. The patient understood the discussion of treatment and procedures well. All questions were answered thoroughly reviewed. Debridement of mycotic and hypertrophic toenails, 1 through 5 bilateral and clearing of subungual debris. No ulceration, no infection noted.  Return Visit-Office Procedure: Patient instructed to return to the office for a follow up visit 3  months for continued evaluation and treatment.    Gardiner Barefoot DPM

## 2017-05-30 ENCOUNTER — Encounter: Payer: Medicare Other | Admitting: *Deleted

## 2017-06-01 ENCOUNTER — Encounter (INDEPENDENT_AMBULATORY_CARE_PROVIDER_SITE_OTHER): Payer: Self-pay | Admitting: *Deleted

## 2017-06-01 VITALS — BP 120/73 | HR 68 | Temp 97.7°F | Wt 181.5 lb

## 2017-06-01 DIAGNOSIS — Z006 Encounter for examination for normal comparison and control in clinical research program: Secondary | ICD-10-CM

## 2017-06-01 NOTE — Progress Notes (Signed)
William [name] is here for his month 20 Reprieve visit, A Randomized Trial to Prevent Vascular Events in HIV. No new problems or concerns. Denied any muscle aches or weakness. Verbalized excellent adherence with his meds. He will return in August for his next study visit.

## 2017-06-05 MED FILL — GENVOYA TABLET: 150-150-200 | 30 days supply | Qty: 30 | Fill #1

## 2017-07-27 MED FILL — GENVOYA TABLET: 150-150-200 | 30 days supply | Qty: 30 | Fill #2

## 2017-08-16 ENCOUNTER — Ambulatory Visit: Payer: Medicare Other | Admitting: Podiatry

## 2017-09-15 MED FILL — GENVOYA TABLET: 150-150-200 | 30 days supply | Qty: 30 | Fill #3

## 2017-10-05 ENCOUNTER — Other Ambulatory Visit: Payer: Medicare Other

## 2017-10-05 ENCOUNTER — Encounter (INDEPENDENT_AMBULATORY_CARE_PROVIDER_SITE_OTHER): Payer: Self-pay | Admitting: *Deleted

## 2017-10-05 VITALS — BP 107/72 | HR 71 | Temp 98.0°F | Wt 174.2 lb

## 2017-10-05 DIAGNOSIS — B20 Human immunodeficiency virus [HIV] disease: Secondary | ICD-10-CM | POA: Diagnosis not present

## 2017-10-05 DIAGNOSIS — Z006 Encounter for examination for normal comparison and control in clinical research program: Secondary | ICD-10-CM

## 2017-10-05 NOTE — Progress Notes (Signed)
William [name] is here for his month 24 Reprieve visit. No new complaints or medications. Denied any muscle aches or weakness. Verbalized excellent adherence with his medications. He is scheduled to see Dr. Megan Salon in a few weeks. He will return in December for his next study visit.

## 2017-10-06 LAB — T-HELPER CELL (CD4) - (RCID CLINIC ONLY)
CD4 % Helper T Cell: 16 % — ABNORMAL LOW (ref 33–55)
CD4 T Cell Abs: 390 /uL — ABNORMAL LOW (ref 400–2700)

## 2017-10-07 LAB — HIV-1 RNA QUANT-NO REFLEX-BLD
HIV 1 RNA Quant: <20 copies/mL
HIV-1 RNA Quant, Log: <1.3 Log copies/mL

## 2017-10-23 ENCOUNTER — Encounter: Payer: Self-pay | Admitting: Internal Medicine

## 2017-10-23 ENCOUNTER — Ambulatory Visit (INDEPENDENT_AMBULATORY_CARE_PROVIDER_SITE_OTHER): Payer: Medicare Other | Admitting: Internal Medicine

## 2017-10-23 VITALS — BP 135/94 | HR 76 | Temp 98.4°F | Wt 179.0 lb

## 2017-10-23 DIAGNOSIS — F17210 Nicotine dependence, cigarettes, uncomplicated: Secondary | ICD-10-CM

## 2017-10-23 DIAGNOSIS — B20 Human immunodeficiency virus [HIV] disease: Secondary | ICD-10-CM

## 2017-10-23 DIAGNOSIS — L739 Follicular disorder, unspecified: Secondary | ICD-10-CM

## 2017-10-23 DIAGNOSIS — F1021 Alcohol dependence, in remission: Secondary | ICD-10-CM

## 2017-10-23 DIAGNOSIS — K2100 Gastro-esophageal reflux disease with esophagitis, without bleeding: Secondary | ICD-10-CM

## 2017-10-23 DIAGNOSIS — Z23 Encounter for immunization: Secondary | ICD-10-CM

## 2017-10-23 MED ORDER — PANTOPRAZOLE SODIUM 40 MG PO TBEC: 40.0000 mg | DELAYED_RELEASE_TABLET | Freq: Two times a day (BID) | ORAL | 5 refills | Status: DC

## 2017-10-23 MED ORDER — ELVITEG-COBIC-EMTRICIT-TENOFAF 150-150-200-10 MG PO TABS: ORAL_TABLET | ORAL | 11 refills | Status: DC

## 2017-10-23 MED ORDER — TRIAMCINOLONE ACETONIDE 0.1 % EX CREA: TOPICAL_CREAM | CUTANEOUS | 2 refills | Status: DC

## 2017-10-23 NOTE — Progress Notes (Signed)
Patient Active Problem List   Diagnosis Date Noted  . Human immunodeficiency virus (HIV) disease ([place]) 07/10/2006    Priority: High  . Chronic hepatitis C without hepatic coma ([place]) 07/10/2006    Priority: Medium  . GERD with esophagitis 09/01/2016  . Folliculitis 77/41/2878  . Cigarette smoker 12/23/2014  . Spontaneous pneumothorax 04/24/2013  . Seasonal allergies 01/22/2013  . PNEUMOCYSTIS PNEUMONIA 07/10/2006  . ALCOHOL ABUSE, HX OF 07/10/2006    Patient's Medications  New Prescriptions   No medications on file  Previous Medications   LORATADINE (CLARITIN) 10 MG TABLET    Take 1 tablet (10 mg total) by mouth daily.   NICOTINE (NICODERM CQ - DOSED IN MG/24 HOURS) 14 MG/24HR PATCH    Place 1 patch (14 mg total) onto the skin daily.   NICOTINE (NICODERM CQ - DOSED IN MG/24 HR) 7 MG/24HR PATCH    Place 1 patch (7 mg total) onto the skin daily.   PITAVASTATIN CALCIUM 4 MG TABS    Take 4 mg by mouth daily. This may be placebo and is study provided. Do not fill prescription.  Modified Medications   Modified Medication Previous Medication   ELVITEGRAVIR-COBICISTAT-EMTRICITABINE-TENOFOVIR ([name]) 150-150-200-10 MG TABS TABLET elvitegravir-cobicistat-emtricitabine-tenofovir ([name]) 150-150-200-10 MG TABS tablet      TAKE 1 TABLET BY MOUTH EVERY DAY WITH BREAKFAST    TAKE 1 TABLET BY MOUTH EVERY DAY WITH BREAKFAST   PANTOPRAZOLE (PROTONIX) 40 MG TABLET pantoprazole (PROTONIX) 40 MG tablet      Take 1 tablet (40 mg total) by mouth 2 (two) times daily.    Take 1 tablet (40 mg total) by mouth 2 (two) times daily.   TRIAMCINOLONE CREAM (KENALOG) 0.1 % triamcinolone cream (KENALOG) 0.1 %      apply to affected area twice a day    apply to affected area twice a day  Discontinued Medications   No medications on file    Subjective: William [name] is in for his routine he has had no problems obtaining, taking or tolerating his [name] and does not recall missing any doses.  His  follicular rash is bothering him a little bit more recently.  He is out of his triamcinolone cream.  He is also out of his proton pump inhibitor and has been having more problems with acid indigestion recently.  He is down to about 2 cigarettes daily.  He has been having problems affording the nicotine patches.  He wants to quit but has no current plan or quit date.  He continues to attend regular AA meetings.  Review of Systems: Review of Systems  Constitutional: Negative for chills, diaphoresis and fever.  HENT: Negative for sore throat.   Respiratory: Negative for cough.   Cardiovascular: Negative for chest pain.  Gastrointestinal: Positive for heartburn. Negative for abdominal pain, diarrhea, nausea and vomiting.  Skin: Positive for itching and rash.  Psychiatric/Behavioral: Negative for depression and substance abuse.    Past Medical History:  Diagnosis Date  . HIV (human immunodeficiency virus infection) ([place])   . Pneumothorax     Social History   Tobacco Use  . Smoking status: Current Every Day Smoker    Packs/day: 0.25    Years: 0.00    Pack years: 0.00    Types: Cigarettes  . Smokeless tobacco: Never Used  . Tobacco comment: trying to quit  Substance Use Topics  . Alcohol use: Yes    Alcohol/week: 0.0 standard drinks    Comment: daily  .  Drug use: Yes    Types: Cocaine    Comment: last used 2 weeks ago     Family History  Problem Relation Age of Onset  . Hypertension Other     No Known Allergies  Health Maintenance  Topic Date Due  . TETANUS/TDAP  08/26/1981  . COLONOSCOPY  08/26/2012  . INFLUENZA VACCINE  09/07/2017  . Hepatitis C Screening  Completed  . HIV Screening  Completed    Objective:  Vitals:   10/23/17 1113  Weight: 179 lb (81.2 kg)   Body mass index is 24.97 kg/m.  Physical Exam  Constitutional: He is oriented to person, place, and time.  He is in good spirits as usual.  HENT:  Mouth/Throat: No oropharyngeal exudate.    Cardiovascular: Normal rate, regular rhythm and normal heart sounds.  Pulmonary/Chest: Effort normal and breath sounds normal.  Abdominal: Soft. He exhibits no distension. There is no tenderness.  Neurological: He is oriented to person, place, and time.  Skin: Rash noted.  He has some mildly excoriated papules on his forearms.  Psychiatric: He has a normal mood and affect.    Lab Results Lab Results  Component Value Date   WBC 6.4 04/04/2017   HGB 14.1 04/04/2017   HCT 42.1 04/04/2017   MCV 84.0 04/04/2017   PLT 254 04/04/2017    Lab Results  Component Value Date   CREATININE 0.94 04/04/2017   BUN 14 04/04/2017   NA 139 04/04/2017   K 4.1 04/04/2017   CL 104 04/04/2017   CO2 28 04/04/2017    Lab Results  Component Value Date   ALT 31 04/04/2017   AST 37 (H) 04/04/2017   ALKPHOS 69 11/04/2016   BILITOT 0.5 04/04/2017    Lab Results  Component Value Date   CHOL 178 09/24/2015   HDL 69 09/24/2015   LDLCALC 98 09/24/2015   TRIG 57 09/24/2015   CHOLHDL 2.6 09/24/2015   Lab Results  Component Value Date   LABRPR REACTIVE (A) 04/04/2017   RPRTITER 1:1 (H) 04/04/2017   HIV 1 RNA Quant (copies/mL)  Date Value  10/05/2017 <20 NOT DETECTED  04/04/2017 <20 DETECTED (A)  09/22/2016 41 (H)   CD4 T Cell Abs (/uL)  Date Value  10/05/2017 390 (L)  04/04/2017 280 (L)  09/22/2016 220 (L)     Problem List Items Addressed This Visit      High   Human immunodeficiency virus (HIV) disease ([place])    His infection remains under excellent, long-term control.  He will continue [name] and follow-up after lab work in 6 months.      Relevant Medications   elvitegravir-cobicistat-emtricitabine-tenofovir ([name]) 150-150-200-10 MG TABS tablet   Other Relevant Orders   T-helper cell (CD4)- ([facility] clinic only)   HIV-1 RNA quant-no reflex-bld   CBC   Comprehensive metabolic panel   RPR     Unprioritized   ALCOHOL ABUSE, HX OF    I congratulated him on his continued  sobriety.      Cigarette smoker    I encouraged him to set a quit date.      Folliculitis    Refilled his triamcinolone cream.      Relevant Medications   triamcinolone cream (KENALOG) 0.1 %   GERD with esophagitis    I refilled his pantoprazole.       Other Visit Diagnoses    HIV disease ([place])       Relevant Medications   elvitegravir-cobicistat-emtricitabine-tenofovir ([name]) 150-150-200-10 MG TABS tablet  Michel Bickers, MD [facility] for Infect[facility] [place] [facility] [phone] pager   [phone] cell 10/23/2017, 11:26 AM

## 2017-10-23 NOTE — Assessment & Plan Note (Signed)
I congratulated him on his continued sobriety.

## 2017-10-23 NOTE — Assessment & Plan Note (Signed)
His infection remains under excellent, long-term control.  He will continue [name] and follow-up after lab work in 6 months. 

## 2017-10-23 NOTE — Assessment & Plan Note (Signed)
Refilled his triamcinolone cream.

## 2017-10-23 NOTE — Assessment & Plan Note (Signed)
I encouraged him to set a quit date.

## 2017-10-23 NOTE — Assessment & Plan Note (Signed)
I refilled his pantoprazole.

## 2017-10-27 MED FILL — GENVOYA TABLET: 150-150-200 | 30 days supply | Qty: 30 | Fill #2

## 2017-10-27 NOTE — Addendum Note (Signed)
Addended by: Landis Gandy on: 10/27/2017 02:36 PM   Modules accepted: Orders

## 2018-01-25 ENCOUNTER — Encounter: Payer: Medicare Other | Admitting: *Deleted

## 2018-02-13 ENCOUNTER — Encounter (INDEPENDENT_AMBULATORY_CARE_PROVIDER_SITE_OTHER): Payer: Medicare Other | Admitting: *Deleted

## 2018-02-13 VITALS — BP 153/96 | HR 70 | Temp 97.9°F | Wt 177.0 lb

## 2018-02-13 DIAGNOSIS — Z006 Encounter for examination for normal comparison and control in clinical research program: Secondary | ICD-10-CM

## 2018-02-13 NOTE — Research (Signed)
William [name] came in today for his month 53 Reprieve visit. He is taking his study medication and [name] daily. He reported no change in medication, no muscle aches or muscle weakness. He will return for his month 32 visit on 4/16.

## 2018-02-22 ENCOUNTER — Encounter: Payer: Self-pay | Admitting: Podiatry

## 2018-02-22 ENCOUNTER — Ambulatory Visit (INDEPENDENT_AMBULATORY_CARE_PROVIDER_SITE_OTHER): Payer: Medicare Other | Admitting: Podiatry

## 2018-02-22 VITALS — BP 126/76

## 2018-02-22 DIAGNOSIS — B351 Tinea unguium: Secondary | ICD-10-CM | POA: Diagnosis not present

## 2018-02-22 DIAGNOSIS — M79675 Pain in left toe(s): Secondary | ICD-10-CM | POA: Diagnosis not present

## 2018-02-22 DIAGNOSIS — M79674 Pain in right toe(s): Secondary | ICD-10-CM | POA: Diagnosis not present

## 2018-02-22 NOTE — Patient Instructions (Signed)

## 2018-03-11 NOTE — Progress Notes (Signed)
Subjective: William [name] presents today referred by with cc of painful, discolored, thick toenails which interfere with daily activities.  Pain is aggravated when wearing enclosed shoe gear.   Patient denies any h/o foot wounds. Denies numbness, tingling or burning in his feet.  Past Medical History:  Diagnosis Date  . HIV (human immunodeficiency virus infection) ([place])   . Pneumothorax      Patient Active Problem List   Diagnosis Date Noted  . GERD with esophagitis 09/01/2016  . Folliculitis 69/67/8938  . Cigarette smoker 12/23/2014  . Spontaneous pneumothorax 04/24/2013  . Seasonal allergies 01/22/2013  . Human immunodeficiency virus (HIV) disease ([place]) 07/10/2006  . Chronic hepatitis C without hepatic coma ([place]) 07/10/2006  . PNEUMOCYSTIS PNEUMONIA 07/10/2006  . ALCOHOL ABUSE, HX OF 07/10/2006     Past Surgical History:  Procedure Laterality Date  . CHEST TUBE INSERTION    . ESOPHAGOGASTRODUODENOSCOPY (EGD) WITH PROPOFOL N/A 08/29/2016   Procedure: ESOPHAGOGASTRODUODENOSCOPY (EGD) WITH PROPOFOL;  Surgeon: Otis Brace, MD;  Location: [facility];  Service: Gastroenterology;  Laterality: N/A;  . MULTIPLE EXTRACTIONS WITH ALVEOLOPLASTY N/A 05/23/2014   Procedure: EXTRACTIONS OF ALL REMAINING TEETH WITH ALVEOLOPLASTY, REMOVAL OF LEFT OSSEOUS EXOTOSIS, REMOVAL MAXILLARY OSSEOUS TUBEROSITY;  Surgeon: Diona Browner, DDS;  Location: [place];  Service: Oral Surgery;  Laterality: N/A;  . PLEURADESIS Left 04/26/2013   Procedure: PLEURADESIS;  Surgeon: Gaye Pollack, MD;  Location: [facility] OR;  Service: Thoracic;  Laterality: Left;  Mechanical  . STAPLING OF BLEBS Left 04/26/2013   Procedure: STAPLING OF BLEBS;  Surgeon: Gaye Pollack, MD;  Location: [facility];  Service: Thoracic;  Laterality: Left;  Marland Kitchen VIDEO ASSISTED THORACOSCOPY Left 04/26/2013   Procedure: VIDEO ASSISTED THORACOSCOPY;  Surgeon: Gaye Pollack, MD;  Location: [facility] OR;  Service: Thoracic;  Laterality: Left;      Current  Outpatient Medications:  .  elvitegravir-cobicistat-emtricitabine-tenofovir ([name]) 150-150-200-10 MG TABS tablet, TAKE 1 TABLET BY MOUTH EVERY DAY WITH BREAKFAST, Disp: 30 tablet, Rfl: 11 .  loratadine (CLARITIN) 10 MG tablet, Take 1 tablet (10 mg total) by mouth daily., Disp: 30 tablet, Rfl: 6 .  nicotine (NICODERM CQ - DOSED IN MG/24 HOURS) 14 mg/24hr patch, Place 1 patch (14 mg total) onto the skin daily., Disp: 28 patch, Rfl: 1 .  nicotine (NICODERM CQ - DOSED IN MG/24 HR) 7 mg/24hr patch, Place 1 patch (7 mg total) onto the skin daily., Disp: 28 patch, Rfl: 1 .  pantoprazole (PROTONIX) 40 MG tablet, Take 1 tablet (40 mg total) by mouth 2 (two) times daily., Disp: 60 tablet, Rfl: 5 .  Pitavastatin Calcium 4 MG TABS, Take 4 mg by mouth daily. This may be placebo and is study provided. Do not fill prescription., Disp: , Rfl:  .  triamcinolone cream (KENALOG) 0.1 %, apply to affected area twice a day, Disp: 30 g, Rfl: 2   No Known Allergies   Social History   Occupational History  . Not on file  Tobacco Use  . Smoking status: Current Every Day Smoker    Packs/day: 0.25    Years: 0.00    Pack years: 0.00    Types: Cigarettes  . Smokeless tobacco: Never Used  . Tobacco comment: trying to quit  Substance and Sexual Activity  . Alcohol use: Yes    Alcohol/week: 0.0 standard drinks    Comment: daily  . Drug use: Yes    Types: Cocaine    Comment: last used 2 weeks ago   . Sexual activity:  Yes    Comment: given condoms     Family History  Problem Relation Age of Onset  . Hypertension Other      Immunization History  Administered Date(s) Administered  . Hepatitis A 03/16/2005, 09/15/2005  . Influenza Split 10/21/2010, 11/09/2011  . Influenza Whole 11/03/2005, 12/20/2007, 11/13/2008, 10/28/2009  . Influenza,inj,Quad PF,6+ Mos 10/17/2012, 03/11/2014, 11/06/2015, 10/23/2017  . Influenza-Unspecified 11/12/2014, 01/11/2017  . Pneumococcal Conjugate-13 10/23/2017  . Pneumococcal  Polysaccharide-23 07/10/2003, 02/16/2010     Review of systems: Positive Findings in bold print.  Constitutional:  chills, fatigue, fever, sweats, weight change Communication: Optometrist, sign Ecologist, hand writing, iPad/Android device Head: headaches, head injury Eyes: changes in vision, eye pain, glaucoma, cataracts, macular degeneration, diplopia, glare,  light sensitivity, eyeglasses or contacts, blindness Ears nose mouth throat: Hard of hearing, ringing in ears, deaf, sign language,  vertigo,   nosebleeds,  rhinitis,  cold sores, snoring, swollen glands Cardiovascular: HTN, edema, arrhythmia, pacemaker in place, defibrillator in place,  chest pain/tightness, chronic anticoagulation, blood clot, heart failure Peripheral Vascular: leg cramps, varicose veins, blood clots, lymphedema Respiratory:  difficulty breathing, denies congestion, SOB, wheezing, cough, emphysema Gastrointestinal: change in appetite or weight, abdominal pain, constipation, diarrhea, nausea, vomiting, vomiting blood, change in bowel habits, abdominal pain, jaundice, rectal bleeding, hemorrhoids, Genitourinary:  nocturia,  pain on urination,  blood in urine, Foley catheter, urinary urgency Musculoskeletal: uses mobility aid,  cramping, stiff joints, painful joints, decreased joint motion, fractures, OA, gout Skin: +changes in toenails, color change, dryness, itching, mole changes,  rash  Neurological: headaches, numbness in feet, paresthesias in feet, burning in feet, fainting,  seizures, change in speech. denies headaches, memory problems/poor historian, cerebral palsy, weakness, paralysis Endocrine: diabetes, hypothyroidism, hyperthyroidism,  goiter, dry mouth, flushing, heat intolerance,  cold intolerance,  excessive thirst, denies polyuria,  nocturia Hematological:  easy bleeding, excessive bleeding, easy bruising, enlarged lymph nodes, on long term blood thinner, history of past  transusions Allergy/immunological:  hives, eczema, frequent infections, multiple drug allergies, seasonal allergies, transplant recipient Psychiatric:  anxiety, depression, mood disorder, suicidal ideations, hallucinations   Objective: Vascular Examination: Capillary refill time immediate x 10 digits Dorsalis pedis 2/4 b/l  Posterior tibial pulses 2/4 b/l No digital hair x 10 digits Skin temperature gradient WNL b/l  Dermatological Examination: Skin with normal turgor, texture and tone b/l  Toenails 1-5 b/l discolored, thick, dystrophic with subungual debris and pain with palpation to nailbeds due to thickness of nails.  Musculoskeletal: Muscle strength 5/5 to all LE muscle groups  Neurological: Sensation intact with 10 gram monofilament Vibratory sensation intact.  Assessment: 1. Painful onychomycosis toenails 1-5 b/l    Plan: 1. Discussed onychomycosis and treatment options.  Literature dispensed on today. 2. Toenails 1-5 b/l were debrided in length and girth without iatrogenic bleeding. 3. Patient to continue soft, supportive shoe gear 4. Patient to report any pedal injuries to medical professional immediately. 5. Follow up 3 months. Patient/POA to call should there be a concern in the interim.

## 2018-03-14 MED FILL — GENVOYA TABLET: 150-150-200 | 30 days supply | Qty: 30 | Fill #0

## 2018-04-03 DIAGNOSIS — F315 Bipolar disorder, current episode depressed, severe, with psychotic features: Secondary | ICD-10-CM | POA: Diagnosis not present

## 2018-04-13 DIAGNOSIS — F315 Bipolar disorder, current episode depressed, severe, with psychotic features: Secondary | ICD-10-CM | POA: Diagnosis not present

## 2018-05-01 MED FILL — GENVOYA TABLET: 150-150-200 | 30 days supply | Qty: 30 | Fill #1

## 2018-05-10 ENCOUNTER — Encounter: Payer: Self-pay | Admitting: Internal Medicine

## 2018-05-10 ENCOUNTER — Other Ambulatory Visit: Payer: Self-pay

## 2018-05-10 ENCOUNTER — Ambulatory Visit (INDEPENDENT_AMBULATORY_CARE_PROVIDER_SITE_OTHER): Payer: Medicare Other | Admitting: Internal Medicine

## 2018-05-10 DIAGNOSIS — B20 Human immunodeficiency virus [HIV] disease: Secondary | ICD-10-CM | POA: Diagnosis not present

## 2018-05-10 MED ORDER — ELVITEG-COBIC-EMTRICIT-TENOFAF 150-150-200-10 MG PO TABS
ORAL_TABLET | ORAL | 11 refills | Status: DC
Start: 2018-05-10 — End: 2018-11-27

## 2018-05-10 NOTE — Assessment & Plan Note (Signed)
His infection has been under excellent, long-term control.  He will continue [name], get lab work today and follow-up in 6 months.

## 2018-05-10 NOTE — Progress Notes (Signed)
Patient Active Problem List   Diagnosis Date Noted  . Human immunodeficiency virus (HIV) disease ([place]) 07/10/2006    Priority: High  . Chronic hepatitis C without hepatic coma ([place]) 07/10/2006    Priority: Medium  . GERD with esophagitis 09/01/2016  . Folliculitis 71/07/2692  . Cigarette smoker 12/23/2014  . Spontaneous pneumothorax 04/24/2013  . Seasonal allergies 01/22/2013  . PNEUMOCYSTIS PNEUMONIA 07/10/2006  . ALCOHOL ABUSE, HX OF 07/10/2006    Patient's Medications  New Prescriptions   No medications on file  Previous Medications   LORATADINE (CLARITIN) 10 MG TABLET    Take 1 tablet (10 mg total) by mouth daily.   NICOTINE (NICODERM CQ - DOSED IN MG/24 HOURS) 14 MG/24HR PATCH    Place 1 patch (14 mg total) onto the skin daily.   NICOTINE (NICODERM CQ - DOSED IN MG/24 HR) 7 MG/24HR PATCH    Place 1 patch (7 mg total) onto the skin daily.   PANTOPRAZOLE (PROTONIX) 40 MG TABLET    Take 1 tablet (40 mg total) by mouth 2 (two) times daily.   PITAVASTATIN CALCIUM 4 MG TABS    Take 4 mg by mouth daily. This may be placebo and is study provided. Do not fill prescription.   TRIAMCINOLONE CREAM (KENALOG) 0.1 %    apply to affected area twice a day  Modified Medications   Modified Medication Previous Medication   ELVITEGRAVIR-COBICISTAT-EMTRICITABINE-TENOFOVIR ([name]) 150-150-200-10 MG TABS TABLET elvitegravir-cobicistat-emtricitabine-tenofovir ([name]) 150-150-200-10 MG TABS tablet      TAKE 1 TABLET BY MOUTH EVERY DAY WITH BREAKFAST    TAKE 1 TABLET BY MOUTH EVERY DAY WITH BREAKFAST  Discontinued Medications   No medications on file    Subjective: William [name] is in for his routine HIV follow-up visit.  He has missed 2-3 doses of medication since his last visit when his pharmacy was late filling his [name].  He is not on any other new medications.  He is doing well.  Review of Systems: Review of Systems  Constitutional: Negative for chills, diaphoresis and fever.   Respiratory: Negative for cough, sputum production and shortness of breath.   Gastrointestinal: Negative for abdominal pain, diarrhea, nausea and vomiting.  Psychiatric/Behavioral: Negative for depression.    Past Medical History:  Diagnosis Date  . HIV (human immunodeficiency virus infection) ([place])   . Pneumothorax     Social History   Tobacco Use  . Smoking status: Former Smoker    Packs/day: 0.25    Years: 0.00    Pack years: 0.00    Types: Cigarettes    Last attempt to quit: 04/09/2018    Years since quitting: 0.0  . Smokeless tobacco: Never Used  . Tobacco comment: trying to quit  Substance Use Topics  . Alcohol use: Yes    Alcohol/week: 0.0 standard drinks    Comment: daily  . Drug use: Yes    Types: Cocaine    Comment: last used 2 weeks ago     Family History  Problem Relation Age of Onset  . Hypertension Other     No Known Allergies  Health Maintenance  Topic Date Due  . TETANUS/TDAP  08/26/1981  . COLONOSCOPY  08/26/2012  . INFLUENZA VACCINE  09/08/2018  . Hepatitis C Screening  Completed  . HIV Screening  Completed    Objective:  Vitals:   05/10/18 1141  BP: 128/76  Pulse: (!) 102  Temp: (!) 97.4 F (36.3 C)  TempSrc: Oral  Weight:  186 lb (84.4 kg)   Body mass index is 25.94 kg/m.  Physical Exam Constitutional:      Comments: He is in good spirits.  HENT:     Mouth/Throat:     Pharynx: No oropharyngeal exudate.  Cardiovascular:     Rate and Rhythm: Normal rate and regular rhythm.     Heart sounds: No murmur.  Pulmonary:     Effort: Pulmonary effort is normal.     Breath sounds: Normal breath sounds.  Psychiatric:        Mood and Affect: Mood normal.     Lab Results Lab Results  Component Value Date   WBC 6.4 04/04/2017   HGB 14.1 04/04/2017   HCT 42.1 04/04/2017   MCV 84.0 04/04/2017   PLT 254 04/04/2017    Lab Results  Component Value Date   CREATININE 0.94 04/04/2017   BUN 14 04/04/2017   NA 139 04/04/2017   K 4.1  04/04/2017   CL 104 04/04/2017   CO2 28 04/04/2017    Lab Results  Component Value Date   ALT 31 04/04/2017   AST 37 (H) 04/04/2017   ALKPHOS 69 11/04/2016   BILITOT 0.5 04/04/2017    Lab Results  Component Value Date   CHOL 178 09/24/2015   HDL 69 09/24/2015   LDLCALC 98 09/24/2015   TRIG 57 09/24/2015   CHOLHDL 2.6 09/24/2015   Lab Results  Component Value Date   LABRPR REACTIVE (A) 04/04/2017   RPRTITER 1:1 (H) 04/04/2017   HIV 1 RNA Quant (copies/mL)  Date Value  10/05/2017 <20 NOT DETECTED  04/04/2017 <20 DETECTED (A)  09/22/2016 41 (H)   CD4 T Cell Abs (/uL)  Date Value  10/05/2017 390 (L)  04/04/2017 280 (L)  09/22/2016 220 (L)     Problem List Items Addressed This Visit      High   Human immunodeficiency virus (HIV) disease ([place])    His infection has been under excellent, long-term control.  He will continue [name], get lab work today and follow-up in 6 months.      Relevant Medications   elvitegravir-cobicistat-emtricitabine-tenofovir ([name]) 150-150-200-10 MG TABS tablet   Other Relevant Orders   T-helper cell (CD4)- ([facility] clinic only)   HIV-1 RNA quant-no reflex-bld   CBC   Comprehensive metabolic panel   RPR    Other Visit Diagnoses    HIV disease ([place])       Relevant Medications   elvitegravir-cobicistat-emtricitabine-tenofovir ([name]) 150-150-200-10 MG TABS tablet        Michel Bickers, MD [facility] for [place] [phone] pager   [phone] cell 05/10/2018, 11:57 AM

## 2018-05-11 LAB — T-HELPER CELL (CD4) - (RCID CLINIC ONLY)
CD4 % Helper T Cell: 10 % — ABNORMAL LOW (ref 33–55)
CD4 T Cell Abs: 200 /uL — ABNORMAL LOW (ref 400–2700)

## 2018-05-18 LAB — RPR: RPR Ser Ql: NONREACTIVE

## 2018-05-18 LAB — CBC
HCT: 43.9 % (ref 38.5–50.0)
Hemoglobin: 14.6 g/dL (ref 13.2–17.1)
MCH: 28.9 pg (ref 27.0–33.0)
MCHC: 33.3 g/dL (ref 32.0–36.0)
MCV: 86.9 fL (ref 80.0–100.0)
MPV: 10.2 fL (ref 7.5–12.5)
Platelets: 259 10*3/uL (ref 140–400)
RBC: 5.05 10*6/uL (ref 4.20–5.80)
RDW: 14.5 % (ref 11.0–15.0)
WBC: 5.7 10*3/uL (ref 3.8–10.8)

## 2018-05-18 LAB — COMPREHENSIVE METABOLIC PANEL
AG Ratio: 1.4 (calc) (ref 1.0–2.5)
ALT: 35 U/L (ref 9–46)
AST: 33 U/L (ref 10–35)
Albumin: 4.7 g/dL (ref 3.6–5.1)
Alkaline phosphatase (APISO): 59 U/L (ref 35–144)
BUN: 16 mg/dL (ref 7–25)
CO2: 25 mmol/L (ref 20–32)
Calcium: 9.5 mg/dL (ref 8.6–10.3)
Chloride: 105 mmol/L (ref 98–110)
Creat: 0.82 mg/dL (ref 0.70–1.33)
Globulin: 3.4 g/dL (calc) (ref 1.9–3.7)
Glucose, Bld: 100 mg/dL — ABNORMAL HIGH (ref 65–99)
Potassium: 4 mmol/L (ref 3.5–5.3)
Sodium: 141 mmol/L (ref 135–146)
Total Bilirubin: 0.3 mg/dL (ref 0.2–1.2)
Total Protein: 8.1 g/dL (ref 6.1–8.1)

## 2018-05-18 LAB — HIV-1 RNA QUANT-NO REFLEX-BLD
HIV 1 RNA Quant: 45 copies/mL — ABNORMAL HIGH
HIV-1 RNA Quant, Log: 1.65 Log copies/mL — ABNORMAL HIGH

## 2018-05-24 ENCOUNTER — Ambulatory Visit: Payer: Medicare Other | Admitting: Podiatry

## 2018-05-24 ENCOUNTER — Encounter (INDEPENDENT_AMBULATORY_CARE_PROVIDER_SITE_OTHER): Payer: Medicare Other | Admitting: *Deleted

## 2018-05-24 ENCOUNTER — Telehealth: Payer: Self-pay | Admitting: Pharmacy Technician

## 2018-05-24 ENCOUNTER — Other Ambulatory Visit: Payer: Self-pay

## 2018-05-24 VITALS — BP 136/90 | HR 68 | Temp 97.7°F | Wt 188.0 lb

## 2018-05-24 DIAGNOSIS — Z006 Encounter for examination for normal comparison and control in clinical research program: Secondary | ICD-10-CM

## 2018-05-24 NOTE — Research (Signed)
William [name] came in today for his month 63 Reprieve visit. He is taking his study medication and [name] daily. He reports no muscle aches, muscle weakness, or medication changes. ACTG COVID assessment was completed and address was verified for medication shipment. He will return for his month 36 visit on 8/10.

## 2018-05-24 NOTE — Telephone Encounter (Signed)
[facility] Patient Advocate Encounter   I was successful in securing patient a $6200 grant from [name] (PAF) to provide copayment coverage for [name].  This will make the out of pocket cost $0.     I have spoken with the patient.    The billing information is as follows and has been shared with [facility].   RxBin: [id number] PCN: [id number] Member ID: [id number] Group ID: [id number] Dates of Eligibility: 05/24/2018 through 05/24/2019  Patient knows to call the office with questions or concerns.  William [name]. [name] Patient [facility] Phone: [phone] Fax:  [phone]

## 2018-05-25 MED FILL — GENVOYA TABLET: 150-150-200 | 30 days supply | Qty: 30 | Fill #0

## 2018-06-28 MED FILL — GENVOYA TABLET: 150-150-200 | 30 days supply | Qty: 30 | Fill #1

## 2018-07-26 MED FILL — GENVOYA TABLET: 150-150-200 | 30 days supply | Qty: 30 | Fill #2

## 2018-08-20 ENCOUNTER — Ambulatory Visit: Payer: Medicare Other | Admitting: Podiatry

## 2018-08-28 MED FILL — GENVOYA TABLET: 150-150-200 | 30 days supply | Qty: 30 | Fill #3

## 2018-09-07 ENCOUNTER — Ambulatory Visit (INDEPENDENT_AMBULATORY_CARE_PROVIDER_SITE_OTHER): Payer: Medicare Other | Admitting: Podiatry

## 2018-09-07 ENCOUNTER — Encounter: Payer: Self-pay | Admitting: Podiatry

## 2018-09-07 ENCOUNTER — Other Ambulatory Visit: Payer: Self-pay

## 2018-09-07 VITALS — Temp 97.9°F

## 2018-09-07 DIAGNOSIS — R234 Changes in skin texture: Secondary | ICD-10-CM

## 2018-09-07 DIAGNOSIS — M79674 Pain in right toe(s): Secondary | ICD-10-CM | POA: Diagnosis not present

## 2018-09-07 DIAGNOSIS — B353 Tinea pedis: Secondary | ICD-10-CM | POA: Diagnosis not present

## 2018-09-07 DIAGNOSIS — B351 Tinea unguium: Secondary | ICD-10-CM | POA: Diagnosis not present

## 2018-09-07 DIAGNOSIS — M79675 Pain in left toe(s): Secondary | ICD-10-CM

## 2018-09-07 MED ORDER — CLOTRIMAZOLE-BETAMETHASONE 1-0.05 % EX CREA: TOPICAL_CREAM | CUTANEOUS | 1 refills | Status: DC

## 2018-09-07 NOTE — Patient Instructions (Signed)

## 2018-09-07 NOTE — Progress Notes (Addendum)
Subjective: William [name] presents today with painful, thick toenails 1-5 b/l that he cannot cut and which interfere with daily activities.  Pain is aggravated when wearing enclosed shoe gear.  He noted rash on right foot near bunion area. Also has itching on arms and other areas of his body.   Current Outpatient Medications:  .  clotrimazole-betamethasone (LOTRISONE) cream, Apply to both feet and between toes bid x 4 weeks., Disp: 45 g, Rfl: 1 .  elvitegravir-cobicistat-emtricitabine-tenofovir ([name]) 150-150-200-10 MG TABS tablet, TAKE 1 TABLET BY MOUTH EVERY DAY WITH BREAKFAST, Disp: 30 tablet, Rfl: 11 .  loratadine (CLARITIN) 10 MG tablet, Take 1 tablet (10 mg total) by mouth daily., Disp: 30 tablet, Rfl: 6 .  nicotine (NICODERM CQ - DOSED IN MG/24 HOURS) 14 mg/24hr patch, Place 1 patch (14 mg total) onto the skin daily. (Patient not taking: Reported on 05/10/2018), Disp: 28 patch, Rfl: 1 .  nicotine (NICODERM CQ - DOSED IN MG/24 HR) 7 mg/24hr patch, Place 1 patch (7 mg total) onto the skin daily. (Patient not taking: Reported on 05/10/2018), Disp: 28 patch, Rfl: 1 .  pantoprazole (PROTONIX) 40 MG tablet, Take 1 tablet (40 mg total) by mouth 2 (two) times daily., Disp: 60 tablet, Rfl: 5 .  Pitavastatin Calcium 4 MG TABS, Take 4 mg by mouth daily. This may be placebo and is study provided. Do not fill prescription., Disp: , Rfl:  .  triamcinolone cream (KENALOG) 0.1 %, apply to affected area twice a day, Disp: 30 g, Rfl: 2 .  VRAYLAR capsule, , Disp: , Rfl:   No Known Allergies  Objective: Vitals:   09/07/18 1433  Temp: 97.9 F (36.6 C)    Vascular Examination: Capillary refill time immediate x 10 digits  Dorsalis pedis and Posterior tibial pulses palpable b/l.  Digital hair absent x 10 digits.  Skin temperature gradient WNL b/l.  Dermatological Examination: Skin with normal turgor, texture and tone b/l.  He has skin eruption noted at dorsal aspect right 1st MPJ area which  is scaly. No erythema, no edema, no drainage, no flocculence.  He has skin crack 1st webspace right foot. No erythema, no edema, no drainage, no flocculence, no signs of deep space infection.  Toenails 1-5 b/l discolored, thick, dystrophic with subungual debris and pain with palpation to nailbeds due to thickness of nails.  Musculoskeletal: Muscle strength 5/5 to all LE muscle groups.  No gross bony deformities b/l.  No pain, crepitus or joint limitation noted with ROM.   Neurological: Sensation intact with 10 gram monofilament.  Vibratory sensation intact.  Assessment: Painful onychomycosis toenails 1-5 b/l  Tinea pedis Skin crack 1st webspace right foot  Plan: 1. Toenails 1-5 b/l were debrided in length and girth without iatrogenic bleeding. Rx was sent for Lotrisone Cream to be applied to both feet and between toes bid for 4 weeks. He was advised to contact his PCP for his arm itching. For skin crack, he was instructed to dry well between toes after baths/showers. He is to apply triple antibiotic ointment to area once daily until healed. Call if area worsens. Patient to continue soft, supportive shoe gear daily. Patient to report any pedal injuries to medical professional immediately. Follow up 3 months.  Patient/POA to call should there be a concern in the interim.

## 2018-09-17 ENCOUNTER — Encounter (INDEPENDENT_AMBULATORY_CARE_PROVIDER_SITE_OTHER): Payer: Medicare Other | Admitting: *Deleted

## 2018-09-17 ENCOUNTER — Other Ambulatory Visit: Payer: Self-pay

## 2018-09-17 VITALS — BP 112/73 | HR 64 | Temp 97.9°F | Wt 183.0 lb

## 2018-09-17 DIAGNOSIS — Z006 Encounter for examination for normal comparison and control in clinical research program: Secondary | ICD-10-CM

## 2018-09-17 NOTE — Research (Signed)
Calder was seen today for Reprieve, A Randomized Trial to Prevent Vascular Events in HIV (study drug is Pitavastatin 4mg  or placebo). He denies any new problems or concerns. He still has an itchy rash on both arms that comes and goes which he uses triamcinolone cream on. He says his adherence is ver good for his medications. He will be returning in December for the next study visit.

## 2018-11-09 ENCOUNTER — Other Ambulatory Visit: Payer: Self-pay | Admitting: *Deleted

## 2018-11-09 DIAGNOSIS — B20 Human immunodeficiency virus [HIV] disease: Secondary | ICD-10-CM

## 2018-11-13 ENCOUNTER — Other Ambulatory Visit: Payer: Self-pay | Admitting: Internal Medicine

## 2018-11-13 ENCOUNTER — Other Ambulatory Visit: Payer: Self-pay

## 2018-11-13 ENCOUNTER — Other Ambulatory Visit: Payer: Medicare Other

## 2018-11-13 DIAGNOSIS — B20 Human immunodeficiency virus [HIV] disease: Secondary | ICD-10-CM | POA: Diagnosis not present

## 2018-11-13 DIAGNOSIS — L739 Follicular disorder, unspecified: Secondary | ICD-10-CM

## 2018-11-13 MED ORDER — TRIAMCINOLONE ACETONIDE 0.1 % EX CREA: TOPICAL_CREAM | CUTANEOUS | 5 refills | Status: DC

## 2018-11-13 MED FILL — TRIAMCINOLONE 0.1% CREAM: 0.1 | 15 days supply | Qty: 30 | Fill #0

## 2018-11-13 MED FILL — GENVOYA TABLET: 150-150-200 | 30 days supply | Qty: 30 | Fill #4

## 2018-11-14 LAB — T-HELPER CELL (CD4) - (RCID CLINIC ONLY)
CD4 % Helper T Cell: 13 % — ABNORMAL LOW (ref 33–65)
CD4 T Cell Abs: 190 /uL — ABNORMAL LOW (ref 400–1790)

## 2018-11-15 ENCOUNTER — Telehealth: Payer: Self-pay

## 2018-11-15 NOTE — Telephone Encounter (Signed)
Received incoming fax from [facility] on [address] for refill request for genvoya. Attempted to call patient to verify new pharmacy request. No answer. Call [facility]  And they verified that medication was mailed out to patient on 11/13/18. Request from [facility] denied and faxed response. Eugenia Mcalpine

## 2018-11-16 LAB — HIV-1 RNA QUANT-NO REFLEX-BLD
HIV 1 RNA Quant: <20 copies/mL — AB
HIV-1 RNA Quant, Log: <1.3 Log copies/mL — AB

## 2018-11-27 ENCOUNTER — Other Ambulatory Visit: Payer: Self-pay

## 2018-11-27 ENCOUNTER — Encounter: Payer: Self-pay | Admitting: Internal Medicine

## 2018-11-27 ENCOUNTER — Ambulatory Visit (INDEPENDENT_AMBULATORY_CARE_PROVIDER_SITE_OTHER): Payer: Medicare Other | Admitting: Internal Medicine

## 2018-11-27 DIAGNOSIS — Z87891 Personal history of nicotine dependence: Secondary | ICD-10-CM | POA: Diagnosis not present

## 2018-11-27 DIAGNOSIS — B20 Human immunodeficiency virus [HIV] disease: Secondary | ICD-10-CM

## 2018-11-27 DIAGNOSIS — Z23 Encounter for immunization: Secondary | ICD-10-CM

## 2018-11-27 DIAGNOSIS — L739 Follicular disorder, unspecified: Secondary | ICD-10-CM

## 2018-11-27 MED ORDER — BICTEGRAVIR-EMTRICITAB-TENOFOV 50-200-25 MG PO TABS: 1.0000 | ORAL_TABLET | Freq: Every day | ORAL | 11 refills | Status: DC

## 2018-11-27 MED FILL — BIKTARVY 50-200-25 MG TABS: 50-200-25 | 30 days supply | Qty: 30 | Fill #0

## 2018-11-27 NOTE — Assessment & Plan Note (Signed)
I congratulated him on finally quitting smoking.

## 2018-11-27 NOTE — Assessment & Plan Note (Addendum)
His infection remains under good, long-term control.  He received his influenza vaccine today.  I will change [name] to [name] which he can take in the morning with or without food.  He will follow-up after repeat blood work in 3 months.  Addendum: After Marchia Bond left today from our pharmacy staff that he had not been answering his cell phone and therefore his William [name] had not been shipped from [facility] between July and October.  They talked with him and let him know that he needed to answer his phone each month.

## 2018-11-27 NOTE — Assessment & Plan Note (Signed)
I will see if his rash improves off of the cobicistat component of [name].

## 2018-11-27 NOTE — Progress Notes (Signed)
Patient Active Problem List   Diagnosis Date Noted  . Human immunodeficiency virus (HIV) disease ([place]) 07/10/2006    Priority: High  . Chronic hepatitis C without hepatic coma ([place]) 07/10/2006    Priority: Medium  . GERD with esophagitis 09/01/2016  . Folliculitis [id number]  . Former smoker 12/23/2014  . Spontaneous pneumothorax 04/24/2013  . Seasonal allergies 01/22/2013  . PNEUMOCYSTIS PNEUMONIA 07/10/2006  . ALCOHOL ABUSE, HX OF 07/10/2006    Patient's Medications  New Prescriptions   BICTEGRAVIR-EMTRICITABINE-TENOFOVIR AF (BIKTARVY) 50-200-25 MG TABS TABLET    Take 1 tablet by mouth daily.  Previous Medications   CLOTRIMAZOLE-BETAMETHASONE (LOTRISONE) CREAM    Apply to both feet and between toes bid x 4 weeks.   LORATADINE (CLARITIN) 10 MG TABLET    Take 1 tablet (10 mg total) by mouth daily.   PANTOPRAZOLE (PROTONIX) 40 MG TABLET    Take 1 tablet (40 mg total) by mouth 2 (two) times daily.   PITAVASTATIN CALCIUM 4 MG TABS    Take 4 mg by mouth daily. This may be placebo and is study provided. Do not fill prescription.   TRIAMCINOLONE CREAM (KENALOG) 0.1 %    apply to affected area twice a day   VRAYLAR CAPSULE      Modified Medications   No medications on file  Discontinued Medications   ELVITEGRAVIR-COBICISTAT-EMTRICITABINE-TENOFOVIR ([name]) 150-150-200-10 MG TABS TABLET    TAKE 1 TABLET BY MOUTH EVERY DAY WITH BREAKFAST   NICOTINE (NICODERM CQ - DOSED IN MG/24 HOURS) 14 MG/24HR PATCH    Place 1 patch (14 mg total) onto the skin daily.   NICOTINE (NICODERM CQ - DOSED IN MG/24 HR) 7 MG/24HR PATCH    Place 1 patch (7 mg total) onto the skin daily.    Subjective: William [name] is in for his routine HIV follow-up visit.  He has been working during the [employer] pandemic but has been wearing his mask.  His sister has been in the hospital on a ventilator for the past 3 weeks with Covid.  He has tested negative.  He has had no problems taking or tolerating his [name].  His  pharmacy was late on 2 occasions causing him to miss a couple of doses.  He takes it each morning but does not always take it with breakfast.  He was able to quit smoking cigarettes.  He is still bothered by his chronic, pruritic rash on his arms.  Review of Systems: Review of Systems  Constitutional: Negative for fever, malaise/fatigue and weight loss.  HENT: Negative for congestion and sore throat.   Respiratory: Negative for cough, sputum production and shortness of breath.   Cardiovascular: Negative for chest pain.  Gastrointestinal: Negative for abdominal pain, diarrhea, nausea and vomiting.  Musculoskeletal: Negative for myalgias.  Skin: Positive for itching and rash.  Psychiatric/Behavioral: Negative for depression and substance abuse.    Past Medical History:  Diagnosis Date  . HIV (human immunodeficiency virus infection) ([place])   . Pneumothorax     Social History   Tobacco Use  . Smoking status: Former Smoker    Packs/day: 0.25    Years: 0.00    Pack years: 0.00    Types: Cigarettes    Quit date: 04/09/2018    Years since quitting: 0.6  . Smokeless tobacco: Never Used  . Tobacco comment: trying to quit  Substance Use Topics  . Alcohol use: Yes    Alcohol/week: 0.0 standard drinks    Comment:  daily  . Drug use: Yes    Types: Cocaine    Comment: last used 2 weeks ago     Family History  Problem Relation Age of Onset  . Hypertension Other     No Known Allergies  Health Maintenance  Topic Date Due  . TETANUS/TDAP  08/26/1981  . COLONOSCOPY  08/26/2012  . INFLUENZA VACCINE  09/08/2018  . Hepatitis C Screening  Completed  . HIV Screening  Completed    Objective:  Vitals:   11/27/18 1009  BP: (!) 142/94  Pulse: 62  Temp: 97.7 F (36.5 C)  Weight: 186 lb (84.4 kg)   Body mass index is 25.94 kg/m.  Physical Exam Constitutional:      Comments: He is in good spirits.  Cardiovascular:     Rate and Rhythm: Normal rate and regular rhythm.     Heart  sounds: No murmur.  Pulmonary:     Effort: Pulmonary effort is normal.     Breath sounds: Normal breath sounds.  Abdominal:     Palpations: Abdomen is soft.     Tenderness: There is no abdominal tenderness.  Musculoskeletal:        General: No swelling or tenderness.  Skin:    Findings: Rash present.     Comments: He has scattered, excoriated lesions on both forearms.  Neurological:     General: No focal deficit present.  Psychiatric:        Mood and Affect: Mood normal.     Lab Results Lab Results  Component Value Date   WBC 5.7 05/10/2018   HGB 14.6 05/10/2018   HCT 43.9 05/10/2018   MCV 86.9 05/10/2018   PLT 259 05/10/2018    Lab Results  Component Value Date   CREATININE 0.82 05/10/2018   BUN 16 05/10/2018   NA 141 05/10/2018   K 4.0 05/10/2018   CL 105 05/10/2018   CO2 25 05/10/2018    Lab Results  Component Value Date   ALT 35 05/10/2018   AST 33 05/10/2018   ALKPHOS 69 11/04/2016   BILITOT 0.3 05/10/2018    Lab Results  Component Value Date   CHOL 178 09/24/2015   HDL 69 09/24/2015   LDLCALC 98 09/24/2015   TRIG 57 09/24/2015   CHOLHDL 2.6 09/24/2015   Lab Results  Component Value Date   LABRPR NON-REACTIVE 05/10/2018   RPRTITER 1:1 (H) 04/04/2017   HIV 1 RNA Quant (copies/mL)  Date Value  11/13/2018 <20 DETECTED (A)  05/10/2018 45 (H)  10/05/2017 <20 NOT DETECTED   CD4 T Cell Abs (/uL)  Date Value  11/13/2018 190 (L)  05/10/2018 200 (L)  10/05/2017 390 (L)     Problem List Items Addressed This Visit      High   Human immunodeficiency virus (HIV) disease ([place])    His infection remains under good, long-term control.  He received his influenza vaccine today.  I will change [name] to [facility] which he can take in the morning with or without food.  He will follow-up after repeat blood work in 3 months.      Relevant Medications   bictegravir-emtricitabine-tenofovir AF (BIKTARVY) 50-200-25 MG TABS tablet   Other Relevant Orders    T-helper cell (CD4)- ([facility] clinic only)   HIV-1 RNA quant-no reflex-bld   CBC   Comprehensive metabolic panel   RPR     Unprioritized   Former smoker    I congratulated him on finally quitting smoking.      Folliculitis  I will see if his rash improves off of the cobicistat component of [name].           Michel Bickers, MD [facility] for [facility] [place] [facility] [phone] pager   [phone] cell 11/27/2018, 10:51 AM

## 2018-12-10 ENCOUNTER — Ambulatory Visit: Payer: Medicare Other | Admitting: Podiatry

## 2018-12-31 MED FILL — BIKTARVY 50-200-25 MG TABS: 50-200-25 | 30 days supply | Qty: 30 | Fill #1

## 2019-01-22 ENCOUNTER — Encounter (INDEPENDENT_AMBULATORY_CARE_PROVIDER_SITE_OTHER): Payer: Self-pay | Admitting: *Deleted

## 2019-01-22 ENCOUNTER — Other Ambulatory Visit: Payer: Self-pay

## 2019-01-22 VITALS — BP 130/83 | HR 69 | Temp 97.7°F | Wt 186.4 lb

## 2019-01-22 DIAGNOSIS — Z006 Encounter for examination for normal comparison and control in clinical research program: Secondary | ICD-10-CM

## 2019-01-22 NOTE — Research (Signed)
Lovie here today for his month 40 Reprieve visit. No new complaints or medications. Verbalized excellent adherence sith his ARV and study medications. Denied any muscle aches or weakness. He will return in April for his next study visit.

## 2019-02-27 ENCOUNTER — Other Ambulatory Visit: Payer: Medicare Other

## 2019-02-27 ENCOUNTER — Other Ambulatory Visit: Payer: Self-pay

## 2019-02-27 DIAGNOSIS — B20 Human immunodeficiency virus [HIV] disease: Secondary | ICD-10-CM | POA: Diagnosis not present

## 2019-02-27 DIAGNOSIS — Z79899 Other long term (current) drug therapy: Secondary | ICD-10-CM

## 2019-02-27 DIAGNOSIS — Z113 Encounter for screening for infections with a predominantly sexual mode of transmission: Secondary | ICD-10-CM

## 2019-02-28 LAB — T-HELPER CELL (CD4) - (RCID CLINIC ONLY)
CD4 % Helper T Cell: 16 % — ABNORMAL LOW (ref 33–65)
CD4 T Cell Abs: 172 /uL — ABNORMAL LOW (ref 400–1790)

## 2019-03-02 LAB — COMPREHENSIVE METABOLIC PANEL
AG Ratio: 1.5 (calc) (ref 1.0–2.5)
ALT: 32 U/L (ref 9–46)
AST: 27 U/L (ref 10–35)
Albumin: 4.5 g/dL (ref 3.6–5.1)
Alkaline phosphatase (APISO): 63 U/L (ref 35–144)
BUN: 8 mg/dL (ref 7–25)
CO2: 29 mmol/L (ref 20–32)
Calcium: 9.4 mg/dL (ref 8.6–10.3)
Chloride: 107 mmol/L (ref 98–110)
Creat: 0.81 mg/dL (ref 0.70–1.33)
Globulin: 3 g/dL (calc) (ref 1.9–3.7)
Glucose, Bld: 84 mg/dL (ref 65–99)
Potassium: 4.3 mmol/L (ref 3.5–5.3)
Sodium: 143 mmol/L (ref 135–146)
Total Bilirubin: 0.4 mg/dL (ref 0.2–1.2)
Total Protein: 7.5 g/dL (ref 6.1–8.1)

## 2019-03-02 LAB — CBC
HCT: 41.7 % (ref 38.5–50.0)
Hemoglobin: 13.7 g/dL (ref 13.2–17.1)
MCH: 28.7 pg (ref 27.0–33.0)
MCHC: 32.9 g/dL (ref 32.0–36.0)
MCV: 87.2 fL (ref 80.0–100.0)
MPV: 10 fL (ref 7.5–12.5)
Platelets: 270 10*3/uL (ref 140–400)
RBC: 4.78 10*6/uL (ref 4.20–5.80)
RDW: 12.8 % (ref 11.0–15.0)
WBC: 5 10*3/uL (ref 3.8–10.8)

## 2019-03-02 LAB — LIPID PANEL
Cholesterol: 154 mg/dL (ref <200)
HDL: 62 mg/dL (ref >=40)
LDL Cholesterol (Calc): 73 mg/dL (calc)
Non-HDL Cholesterol (Calc): 92 mg/dL (calc) (ref <130)
Total CHOL/HDL Ratio: 2.5 (calc) (ref <5.0)
Triglycerides: 111 mg/dL (ref <150)

## 2019-03-02 LAB — RPR: RPR Ser Ql: NONREACTIVE

## 2019-03-02 LAB — HIV-1 RNA QUANT-NO REFLEX-BLD
HIV 1 RNA Quant: <20 copies/mL
HIV-1 RNA Quant, Log: <1.3 Log copies/mL

## 2019-03-13 ENCOUNTER — Ambulatory Visit (INDEPENDENT_AMBULATORY_CARE_PROVIDER_SITE_OTHER): Payer: Medicare Other | Admitting: Internal Medicine

## 2019-03-13 ENCOUNTER — Other Ambulatory Visit: Payer: Self-pay

## 2019-03-13 ENCOUNTER — Encounter: Payer: Self-pay | Admitting: Internal Medicine

## 2019-03-13 DIAGNOSIS — B20 Human immunodeficiency virus [HIV] disease: Secondary | ICD-10-CM

## 2019-03-13 MED ORDER — SULFAMETHOXAZOLE-TRIMETHOPRIM 800-160 MG PO TABS: 1.0000 | ORAL_TABLET | Freq: Every day | ORAL | 11 refills | Status: DC

## 2019-03-13 MED FILL — BIKTARVY 50-200-25 MG TABS: 50-200-25 | 30 days supply | Qty: 30 | Fill #2

## 2019-03-13 NOTE — Progress Notes (Signed)
Patient Active Problem List   Diagnosis Date Noted  . Human immunodeficiency virus (HIV) disease ([place]) 07/10/2006    Priority: High  . Chronic hepatitis C without hepatic coma ([place]) 07/10/2006    Priority: Medium  . GERD with esophagitis 09/01/2016  . Folliculitis [id number]  . Former smoker 12/23/2014  . Spontaneous pneumothorax 04/24/2013  . Seasonal allergies 01/22/2013  . PNEUMOCYSTIS PNEUMONIA 07/10/2006  . ALCOHOL ABUSE, HX OF 07/10/2006    Patient's Medications  New Prescriptions   SULFAMETHOXAZOLE-TRIMETHOPRIM (BACTRIM DS) 800-160 MG TABLET    Take 1 tablet by mouth daily.  Previous Medications   BICTEGRAVIR-EMTRICITABINE-TENOFOVIR AF (BIKTARVY) 50-200-25 MG TABS TABLET    Take 1 tablet by mouth daily.   CLOTRIMAZOLE-BETAMETHASONE (LOTRISONE) CREAM    Apply to both feet and between toes bid x 4 weeks.   LORATADINE (CLARITIN) 10 MG TABLET    Take 1 tablet (10 mg total) by mouth daily.   PANTOPRAZOLE (PROTONIX) 40 MG TABLET    Take 1 tablet (40 mg total) by mouth 2 (two) times daily.   PITAVASTATIN CALCIUM 4 MG TABS    Take 4 mg by mouth daily. This may be placebo and is study provided. Do not fill prescription.   TRIAMCINOLONE CREAM (KENALOG) 0.1 %    apply to affected area twice a day   VRAYLAR CAPSULE      Modified Medications   No medications on file  Discontinued Medications   No medications on file    Subjective: Rasheem is in for his routine HIV follow-up visit.  He has not had any problems obtaining, taking or tolerating his Biktarvy.  He takes it each morning.  He does not recall missing doses.  He has been working 4 to 5 days each week.  He says that his coworkers are wearing facemask during the pandemic.  He recently went for voluntary testing and was Covid negative.  He was not sick at that time.  His sister died of complications of Covid last year.  He says that he has been able to deal with her passing without significant grief or depression.  He  has been able to stay off of cigarettes.  He does not have a PCP  Review of Systems: Review of Systems  Constitutional: Negative for fever.  HENT: Negative for congestion and sore throat.   Respiratory: Negative for cough and shortness of breath.   Cardiovascular: Negative for chest pain.  Gastrointestinal: Negative for abdominal pain, diarrhea, nausea and vomiting.  Psychiatric/Behavioral: Negative for depression.    Past Medical History:  Diagnosis Date  . HIV (human immunodeficiency virus infection) ([place])   . Pneumothorax     Social History   Tobacco Use  . Smoking status: Current Every Day Smoker    Packs/day: 0.25    Years: 0.00    Pack years: 0.00    Types: Cigarettes    Last attempt to quit: 04/09/2018    Years since quitting: 0.9  . Smokeless tobacco: Never Used  . Tobacco comment: trying to quit  Substance Use Topics  . Alcohol use: Yes    Alcohol/week: 0.0 standard drinks    Comment: daily  . Drug use: Yes    Types: Cocaine    Comment: last used 2 weeks ago     Family History  Problem Relation Age of Onset  . Hypertension Other     No Known Allergies  Health Maintenance  Topic Date Due  . TETANUS/TDAP  08/26/1981  . COLONOSCOPY  08/26/2012  . INFLUENZA VACCINE  Completed  . Hepatitis C Screening  Completed  . HIV Screening  Completed    Objective:  Vitals:   03/13/19 0848  BP: (!) 149/98  Pulse: 64  Temp: 97.8 F (36.6 C)  TempSrc: Oral  SpO2: 97%  Weight: 185 lb (83.9 kg)  Height: 6' (1.829 m)   Body mass index is 25.09 kg/m.  Physical Exam Constitutional:      Comments: He is in his usual good spirits.  Cardiovascular:     Rate and Rhythm: Normal rate and regular rhythm.     Heart sounds: No murmur.  Pulmonary:     Effort: Pulmonary effort is normal.     Breath sounds: Normal breath sounds.  Abdominal:     Palpations: Abdomen is soft.     Tenderness: There is no abdominal tenderness.  Skin:    Findings: No rash.    Neurological:     General: No focal deficit present.  Psychiatric:        Mood and Affect: Mood normal.     Lab Results Lab Results  Component Value Date   WBC 5.0 02/27/2019   HGB 13.7 02/27/2019   HCT 41.7 02/27/2019   MCV 87.2 02/27/2019   PLT 270 02/27/2019    Lab Results  Component Value Date   CREATININE 0.81 02/27/2019   BUN 8 02/27/2019   NA 143 02/27/2019   K 4.3 02/27/2019   CL 107 02/27/2019   CO2 29 02/27/2019    Lab Results  Component Value Date   ALT 32 02/27/2019   AST 27 02/27/2019   ALKPHOS 69 11/04/2016   BILITOT 0.4 02/27/2019    Lab Results  Component Value Date   CHOL 154 02/27/2019   HDL 62 02/27/2019   LDLCALC 73 02/27/2019   TRIG 111 02/27/2019   CHOLHDL 2.5 02/27/2019   Lab Results  Component Value Date   LABRPR NON-REACTIVE 02/27/2019   RPRTITER 1:1 (H) 04/04/2017   HIV 1 RNA Quant (copies/mL)  Date Value  02/27/2019 <20 NOT DETECTED  11/13/2018 <20 DETECTED (A)  05/10/2018 45 (H)   CD4 T Cell Abs (/uL)  Date Value  02/27/2019 172 (L)  11/13/2018 190 (L)  05/10/2018 200 (L)     Problem List Items Addressed This Visit      High   Human immunodeficiency virus (HIV) disease ([place])    His infection remains under excellent, long-term control but he has had some decline in his CD4 count over the past year and a half.  I will start him on prophylactic trimethoprim sulfamethoxazole.  He will continue Biktarvy and follow-up after lab work in 6 months.  He has already received his influenza vaccine and is planning on getting the Covid vaccine when he is eligible.  We will make a referral for primary care.      Relevant Medications   sulfamethoxazole-trimethoprim (BACTRIM DS) 800-160 MG tablet   Other Relevant Orders   T-helper cell (CD4)- ([facility] clinic only)   HIV-1 RNA quant-no reflex-bld        Michel Bickers, MD [facility] for [place] [phone] pager   [phone]  cell 03/13/2019, 9:06 AM

## 2019-03-13 NOTE — Assessment & Plan Note (Addendum)
His infection remains under excellent, long-term control but he has had some decline in his CD4 count over the past year and a half.  I will start him on prophylactic trimethoprim sulfamethoxazole.  He will continue Biktarvy and follow-up after lab work in 6 months.  He has already received his influenza vaccine and is planning on getting the Covid vaccine when he is eligible.  We will make a referral for primary care.

## 2019-03-18 ENCOUNTER — Ambulatory Visit: Payer: Medicare Other

## 2019-03-27 ENCOUNTER — Encounter: Payer: Self-pay | Admitting: Internal Medicine

## 2019-03-27 ENCOUNTER — Ambulatory Visit (INDEPENDENT_AMBULATORY_CARE_PROVIDER_SITE_OTHER): Payer: Medicare Other | Admitting: Internal Medicine

## 2019-03-27 ENCOUNTER — Other Ambulatory Visit: Payer: Self-pay

## 2019-03-27 VITALS — BP 145/108 | HR 72 | Temp 98.0°F | Ht 72.0 in | Wt 189.7 lb

## 2019-03-27 DIAGNOSIS — F4321 Adjustment disorder with depressed mood: Secondary | ICD-10-CM | POA: Diagnosis not present

## 2019-03-27 DIAGNOSIS — I10 Essential (primary) hypertension: Secondary | ICD-10-CM | POA: Diagnosis present

## 2019-03-27 DIAGNOSIS — B20 Human immunodeficiency virus [HIV] disease: Secondary | ICD-10-CM

## 2019-03-27 DIAGNOSIS — F1021 Alcohol dependence, in remission: Secondary | ICD-10-CM | POA: Diagnosis not present

## 2019-03-27 DIAGNOSIS — F1020 Alcohol dependence, uncomplicated: Secondary | ICD-10-CM

## 2019-03-27 HISTORY — DX: Adjustment disorder with depressed mood: F43.21

## 2019-03-27 MED ORDER — NALTREXONE HCL 50 MG PO TABS: 25.0000 mg | ORAL_TABLET | Freq: Every day | ORAL | 3 refills | Status: DC

## 2019-03-27 MED ORDER — HYDROCHLOROTHIAZIDE 12.5 MG PO CAPS: 12.5000 mg | ORAL_CAPSULE | Freq: Every day | ORAL | 11 refills | Status: DC

## 2019-03-27 MED FILL — HYDROCHLOROTHIAZIDE 12.5 MG: 12.5 | 30 days supply | Qty: 30 | Fill #0

## 2019-03-27 MED FILL — NALTREXONE 50 MG TABLET: 50 | 60 days supply | Qty: 30 | Fill #0

## 2019-03-27 NOTE — Progress Notes (Signed)
Internal Medicine Clinic Attending  Case discussed with Dr. Agyei at the time of the visit.  We reviewed the resident's history and exam and pertinent patient test results.  I agree with the assessment, diagnosis, and plan of care documented in the resident's note.    

## 2019-03-27 NOTE — Assessment & Plan Note (Signed)
Grief: He states that he recently lost his sister to coronavirus and is having a hard time but he is "surviving.  "  I offered him the possibility of seeing the clinic integrative behavioral health specialist however he states that he currently does not have a phone and will call the clinic to give [place] his mother's number so we will reach out to him.

## 2019-03-27 NOTE — Assessment & Plan Note (Signed)
HIV: He follows up with Dr. Megan Salon.  His recent lab work performed on 02/27/2019 showed undetectable viral load however his CD4 was 172.  He was started on Bactrim but has not picked up medication.  Plan: -Continue Biktarvy -Continue Bactrim

## 2019-03-27 NOTE — Progress Notes (Signed)
   CC: Follow-up high blood pressure, establish care  HPI:  [name] is a 59 y.o. with medical problems listed below presenting to establish care.  Please see problem based charting for further details.  Past Medical History:  Diagnosis Date  . HIV (human immunodeficiency virus infection) ([place])   . Pneumothorax    Past medical history:  -Schizoaffective disorder, follows up with [facility] -HIV: Follows up with our [facility] Immunization: Up-to-date Surgical history: Tooth removal Allergies: None Family history: Hypertension, heart problems Social history: Born and raised in [place].  Went to [employer].  Has 4 brothers and a sister who recently passed from coronavirus.  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  He drinks about 6 to 12 cans of beer a day.  He started drinking at about age 34.  He smokes 2 to 4 pieces of cigarettes a day.  He denies illicit drug use.  He is sexually active with women and uses protection.  His mother and 4 brothers are in [place].  Review of Systems:   Review of Systems  Constitutional: Negative for chills and fever.  HENT: Negative.   Eyes: Negative.   Respiratory: Negative.   Cardiovascular: Negative.   Gastrointestinal: Negative.   Genitourinary: Negative.   Musculoskeletal: Negative.   Skin: Negative.   Neurological: Negative.   Psychiatric/Behavioral:       Grief   Physical Exam:  Vitals:   03/27/19 0941 03/27/19 0946  BP: (!) 153/109 (!) 145/108  Pulse: 69 72  Temp: 98 F (36.7 C)   TempSrc: Oral   SpO2: 100%   Weight: 189 lb 11.2 oz (86 kg)   Height: 6' (1.829 m)    Physical Exam  Constitutional: He is well-developed, well-nourished, and in no distress.  HENT:  Head: Normocephalic and atraumatic.  Eyes: Conjunctivae are normal.  Cardiovascular: Normal rate, regular rhythm and normal heart sounds.  No murmur heard. Pulmonary/Chest: Effort normal and breath sounds normal. No respiratory distress. He has  no wheezes.  Abdominal: Soft. Bowel sounds are normal. He exhibits no distension. There is no abdominal tenderness.  Musculoskeletal:        General: No edema.  Neurological: Gait normal.  Skin: Skin is warm.  Psychiatric: Mood and affect normal.    Assessment & Plan:   See Encounters Tab for problem based charting.  Patient discussed with Dr. Heber [name]

## 2019-03-27 NOTE — Assessment & Plan Note (Signed)
Hypertension: Newly diagnosed.  His BPs have been high in the past.  He has ranged from 110s-150s/60s-90s.  BP Readings from Last 3 Encounters:  03/27/19 (!) 145/108  03/13/19 (!) 149/98  01/22/19 130/83   Plan: -Start HCTZ 12.5 mg daily

## 2019-03-27 NOTE — Assessment & Plan Note (Signed)
Alcohol use disorder: He states that he has been drinking alcohol since 43 to 59 years of age.  On average, he drinks about 6 to 12 packs a day.  He denies any history of alcohol withdrawal.  He states that in the future, he might have to check into a long-term rehab facility.  Plan: -He is agreeable to starting naltrexone and also tapering down alcohol use

## 2019-03-27 NOTE — Patient Instructions (Signed)
Mr. William [name],   Thanks for seeing [place] today. As we talked about, I will start you on a blood pressure medicine and also a medicine to decrease your craving for alcohol. PLEASE do not quit the alcohol cold [place]. Decrease the number you drink slowly over the next several weeks.   Take care! Dr. Eileen Stanford  Please call the internal medicine center clinic if you have any questions or concerns, we may be able to help and keep you from a long and expensive emergency room wait. Our clinic and after hours phone number is [phone], the best time to call is Monday through Friday 9 am to 4 pm but there is always someone available 24/7 if you have an emergency. If you need medication refills please notify your pharmacy one week in advance and they will send [place] a request.

## 2019-04-10 ENCOUNTER — Encounter: Payer: Self-pay | Admitting: Licensed Clinical Social Worker

## 2019-04-16 MED FILL — BIKTARVY 50-200-25 MG TABS: 50-200-25 | 30 days supply | Qty: 30 | Fill #3

## 2019-05-13 NOTE — Addendum Note (Signed)
Addended by: Hulan Fray on: 05/13/2019 06:18 PM   Modules accepted: Orders

## 2019-05-20 MED FILL — BIKTARVY 50-200-25 MG TABS: 50-200-25 | 30 days supply | Qty: 30 | Fill #4

## 2019-05-22 ENCOUNTER — Encounter (INDEPENDENT_AMBULATORY_CARE_PROVIDER_SITE_OTHER): Payer: Medicare Other | Admitting: *Deleted

## 2019-05-22 ENCOUNTER — Other Ambulatory Visit: Payer: Self-pay

## 2019-05-22 VITALS — Wt 182.4 lb

## 2019-05-22 DIAGNOSIS — Z006 Encounter for examination for normal comparison and control in clinical research program: Secondary | ICD-10-CM

## 2019-05-22 NOTE — Research (Signed)
William [name] was here for his month 35 visit for Reprieve, A Randomized Trial to Prevent Vascular Events in HIV (study drug is Pitavastatin 4mg  or placebo)  He denies any new problems but did note that he recently saw a PCP and was started on HCTZ 12.5 and Naltrexone. He said the naltrexone has helped curb his alcohol use. He says his adherence is excellent with his study meds and Biktarvy. He also recently got the [employer] Covid vaccine. He denies any heart related issues or muscle pain. He will be returning in August for the next study visit and we will get his regular labs at that time.

## 2019-06-06 ENCOUNTER — Telehealth: Payer: Self-pay | Admitting: Pharmacy Technician

## 2019-06-06 NOTE — Telephone Encounter (Signed)
[facility] Patient Advocate Encounter  Nurse Harvin Hazel let me know that Mr. William [name] came into the clinic stating he needed his medication and asked if it was here.  [facility] ships his medication to his home monthly.  The last shipment was delivered to his front porch 05/21/2019, confirmed by [employer].  If he returns, a tracer can be put on the medication if he cannot find it.  The pharmacy phone number is [phone].  William [name]. William [name] Patient [facility] Phone: [phone] Fax:  [phone]

## 2019-07-25 ENCOUNTER — Encounter: Payer: Self-pay | Admitting: Internal Medicine

## 2019-07-25 ENCOUNTER — Ambulatory Visit (INDEPENDENT_AMBULATORY_CARE_PROVIDER_SITE_OTHER): Payer: Medicare Other | Admitting: Internal Medicine

## 2019-07-25 VITALS — BP 113/76 | HR 78 | Temp 98.0°F | Ht 71.0 in | Wt 190.4 lb

## 2019-07-25 DIAGNOSIS — F1020 Alcohol dependence, uncomplicated: Secondary | ICD-10-CM | POA: Diagnosis not present

## 2019-07-25 DIAGNOSIS — I10 Essential (primary) hypertension: Secondary | ICD-10-CM | POA: Diagnosis not present

## 2019-07-25 DIAGNOSIS — E785 Hyperlipidemia, unspecified: Secondary | ICD-10-CM | POA: Diagnosis present

## 2019-07-25 MED ORDER — HYDROCHLOROTHIAZIDE 12.5 MG PO CAPS: 12.5000 mg | ORAL_CAPSULE | Freq: Every day | ORAL | 11 refills | Status: DC

## 2019-07-25 MED ORDER — NALTREXONE HCL 50 MG PO TABS: 25.0000 mg | ORAL_TABLET | Freq: Every day | ORAL | 3 refills | Status: DC

## 2019-07-25 MED FILL — NALTREXONE 50 MG TABLET: 50 | 60 days supply | Qty: 30 | Fill #0

## 2019-07-25 MED FILL — HYDROCHLOROTHIAZIDE 12.5 MG: 12.5 | 30 days supply | Qty: 30 | Fill #0

## 2019-07-25 NOTE — Patient Instructions (Addendum)
Thank you for visiting William [name] in clinic today.  Below is a summary of what we discussed:  1.  Hypertension -Continue taking hydrochlorothiazide 12.5 mg daily.  This medication was refilled and sent to pharmacy.  2.  Alcohol use  -Continue taking naltrexone 25 mg daily.  This medication was sent to your pharmacy 2.  3.  Follow-up -Please schedule a follow-up appointment to see William [name] again in 4 months.  If you have any questions or concerns, please feel free to reach out to William [name].

## 2019-07-25 NOTE — Assessment & Plan Note (Addendum)
Patient was newly diagnosed with hypertension in the last visit in February.  He was prescribed HCTZ 12.5 mg daily.  Patient states he ran out of his medication 2 days ago.  Plan: -Refilled HCTZ 12.5 mg daily

## 2019-07-25 NOTE — Progress Notes (Addendum)
   CC: Medication refills  HPI: [name] is a 59 y.o. with a hx of HIV, HTN and EtOH abuse who presents today for medication refills. Please refer to the problem based charting for further details   Past Medical History:  Diagnosis Date  . Folliculitis 09/01/3662  . HIV (human immunodeficiency virus infection) ([place])   . Pneumothorax    Review of Systems:  Negative unless mentioned in the HPI.  Physical Exam: Vitals:   07/25/19 1426  BP: 113/76  Pulse: 78  Temp: 98 F (36.7 C)  TempSrc: Oral  Weight: 190 lb 6.4 oz (86.4 kg)  Height: 5\' 11"  (1.803 m)   Physical Exam Constitutional:      General: He is not in acute distress.    Appearance: Normal appearance. He is normal weight. He is not ill-appearing, toxic-appearing or diaphoretic.  HENT:     Head: Normocephalic and atraumatic.  Cardiovascular:     Rate and Rhythm: Normal rate and regular rhythm.     Pulses: Normal pulses.     Heart sounds: Normal heart sounds. No murmur heard.  No friction rub. No gallop.   Pulmonary:     Effort: Pulmonary effort is normal. No respiratory distress.     Breath sounds: Normal breath sounds. No wheezing or rales.  Abdominal:     General: Abdomen is flat. There is no distension.     Palpations: Abdomen is soft.     Tenderness: There is no abdominal tenderness. There is no guarding.  Skin:    General: Skin is warm.  Neurological:     General: No focal deficit present.     Mental Status: He is alert. Mental status is at baseline.  Psychiatric:        Mood and Affect: Mood normal.    Assessment & Plan:   See Encounters Tab for problem based charting.  Patient discussed with Dr. Evette Doffing

## 2019-07-25 NOTE — Assessment & Plan Note (Signed)
Patient states that he is cut back on his drinking significantly since his last visit.  He previously drank 6-12 beers a day, now he drinks 2 a day.  He has been on naltrexone 25 mg daily and has tolerated this medication well.  Plan: -Refilled naltrexone 25 mg daily

## 2019-07-26 NOTE — Progress Notes (Signed)
Internal Medicine Clinic Attending  Case discussed with Dr. Alexander at the time of the visit.  We reviewed the resident's history and exam and pertinent patient test results.  I agree with the assessment, diagnosis, and plan of care documented in the resident's note.  

## 2019-09-03 ENCOUNTER — Telehealth: Payer: Self-pay | Admitting: *Deleted

## 2019-09-03 DIAGNOSIS — B20 Human immunodeficiency virus [HIV] disease: Secondary | ICD-10-CM

## 2019-09-03 MED ORDER — BICTEGRAVIR-EMTRICITAB-TENOFOV 50-200-25 MG PO TABS: 1.0000 | ORAL_TABLET | Freq: Every day | ORAL | 0 refills | Status: DC

## 2019-09-03 MED FILL — BIKTARVY 50-200-25 MG TABS: 50-200-25 | 30 days supply | Qty: 30 | Fill #0

## 2019-09-03 NOTE — Telephone Encounter (Signed)
Patient walked into clinic asked to speak with a nurse about his medication delivery issues. He is confused, not able to stand still, appears altered. William [name] suspended deliveries in April after numerous unsuccessful attempts to contact him.  Patient unable to tell me his phone number or address, keeps stating "it's in the computer" but cannot confirm specifically without being prompted. He says he lives in [place] and his mother answers his phone.  He is unclear about his medication status (goes from missing 3 months, out for 1 month, having 1 week left, having 2 weeks left, being out for 2 months). RN spoke with William [name]. Patient is scheduled to come back to clinic 8/3 for blood work, 8/17 to follow up with William [name].  OK per William [name] to send 30 day supply to [facility] today. William [name] will coordinate the shipment to the address in EPIC. William Gandy, RN

## 2019-09-03 NOTE — Telephone Encounter (Signed)
Thanks for helping Maximiano.

## 2019-09-06 ENCOUNTER — Other Ambulatory Visit: Payer: Self-pay

## 2019-09-06 DIAGNOSIS — B20 Human immunodeficiency virus [HIV] disease: Secondary | ICD-10-CM

## 2019-09-06 MED ORDER — BICTEGRAVIR-EMTRICITAB-TENOFOV 50-200-25 MG PO TABS: 1.0000 | ORAL_TABLET | Freq: Every day | ORAL | 0 refills | Status: DC

## 2019-09-06 NOTE — Progress Notes (Signed)
Spoke with Karena Addison at [facility]. Per pharmacy prescription was not received from 7/27. Will call in new prescription and cancel previous one. Will have patient call pharmacy to set up refill. [place]

## 2019-09-10 ENCOUNTER — Other Ambulatory Visit: Payer: Self-pay

## 2019-09-10 ENCOUNTER — Other Ambulatory Visit: Payer: Medicare Other

## 2019-09-10 ENCOUNTER — Encounter (INDEPENDENT_AMBULATORY_CARE_PROVIDER_SITE_OTHER): Payer: Self-pay | Admitting: *Deleted

## 2019-09-10 VITALS — BP 123/82 | HR 59 | Temp 97.5°F | Wt 187.0 lb

## 2019-09-10 DIAGNOSIS — Z006 Encounter for examination for normal comparison and control in clinical research program: Secondary | ICD-10-CM

## 2019-09-10 DIAGNOSIS — B20 Human immunodeficiency virus [HIV] disease: Secondary | ICD-10-CM

## 2019-09-10 MED FILL — HYDROCHLOROTHIAZIDE 12.5 MG: 12.5 | 30 days supply | Qty: 30 | Fill #1

## 2019-09-10 NOTE — Research (Signed)
William [name] is here for his month 48 Reprieve visit. No new complaints or medications. States that he has had some difficulty getting his medications from the pharmacy. States that he did get his Phillips Odor "a few days ago" but that he has been out of his HCTZ and Naltrexone for about a month. Spoke with Einar Pheasant at the [facility]. Per Einar Pheasant, he is able to refill both medications and will send them out today. Einar Pheasant said that the pharmacy record showed that [name] should have 13 days remaining on his naltrexone. We reviewed with Amariyon that he should only be taking half a tablet daily, which is what he said he has been taking until he ran out. I provided Klaus with the direct number to the pharmacy for refill request and explained to him that he needs to have contact with the pharmacy to verify his shipping address with each refill before the pharmacy can ship him medications. He verbalized understanding. He denied any muscle aches or weakness. Verbalized excellent adherence with his study product. He has an appointment scheduled with Dr. Megan Salon in 2 weeks and will return in December for his next study visit.

## 2019-09-11 LAB — T-HELPER CELL (CD4) - (RCID CLINIC ONLY)
CD4 % Helper T Cell: 16 % — ABNORMAL LOW (ref 33–65)
CD4 T Cell Abs: 207 /uL — ABNORMAL LOW (ref 400–1790)

## 2019-09-11 MED FILL — NALTREXONE 50 MG TABLET: 50 | 30 days supply | Qty: 15 | Fill #1

## 2019-09-16 LAB — HIV-1 RNA QUANT-NO REFLEX-BLD
HIV 1 RNA Quant: 23 Copies/mL — ABNORMAL HIGH
HIV-1 RNA Quant, Log: 1.36 Log cps/mL — ABNORMAL HIGH

## 2019-09-24 ENCOUNTER — Other Ambulatory Visit: Payer: Self-pay

## 2019-09-24 ENCOUNTER — Ambulatory Visit (INDEPENDENT_AMBULATORY_CARE_PROVIDER_SITE_OTHER): Payer: Medicare Other | Admitting: Internal Medicine

## 2019-09-24 ENCOUNTER — Encounter: Payer: Self-pay | Admitting: Internal Medicine

## 2019-09-24 DIAGNOSIS — B20 Human immunodeficiency virus [HIV] disease: Secondary | ICD-10-CM

## 2019-09-24 NOTE — Assessment & Plan Note (Signed)
His HIV infection remains under very good, long-term control.  He will continue Biktarvy and follow-up after lab work in 6 months.

## 2019-09-24 NOTE — Progress Notes (Signed)
Patient Active Problem List   Diagnosis Date Noted  . Human immunodeficiency virus (HIV) disease ([place]) 07/10/2006    Priority: High  . Chronic hepatitis C without hepatic coma ([place]) 07/10/2006    Priority: Medium  . Hypertension 03/27/2019  . Grief 03/27/2019  . GERD with esophagitis 09/01/2016  . Former smoker 12/23/2014  . Spontaneous pneumothorax 04/24/2013  . Seasonal allergies 01/22/2013  . PNEUMOCYSTIS PNEUMONIA 07/10/2006  . Alcohol use disorder, moderate, dependence ([place]) 07/10/2006    Patient's Medications  New Prescriptions   No medications on file  Previous Medications   BICTEGRAVIR-EMTRICITABINE-TENOFOVIR AF (BIKTARVY) 50-200-25 MG TABS TABLET    Take 1 tablet by mouth daily.   CLOTRIMAZOLE-BETAMETHASONE (LOTRISONE) CREAM    Apply to both feet and between toes bid x 4 weeks.   HYDROCHLOROTHIAZIDE (MICROZIDE) 12.5 MG CAPSULE    Take 1 capsule (12.5 mg total) by mouth daily.   LORATADINE (CLARITIN) 10 MG TABLET    Take 1 tablet (10 mg total) by mouth daily.   NALTREXONE (DEPADE) 50 MG TABLET    Take 0.5 tablets (25 mg total) by mouth daily.   PITAVASTATIN CALCIUM 4 MG TABS    Take 4 mg by mouth daily. This may be placebo and is study provided. Do not fill prescription.   TRIAMCINOLONE CREAM (KENALOG) 0.1 %    apply to affected area twice a day   VRAYLAR CAPSULE      Modified Medications   No medications on file  Discontinued Medications   No medications on file    Subjective: William William [name] is in for his routine HIV follow-up visit.  He recently moved to a new apartment.  Following the move it took a few days for his pharmacy to get him his refill of Biktarvy.  Other than that he does not recall missing any doses.  He received the [place] vaccine in the spring.  He is feeling well.  Review of Systems: Review of Systems  Constitutional: Negative for fever.  Respiratory: Negative for cough and shortness of breath.   Cardiovascular: Negative for  chest pain.  Gastrointestinal: Negative for abdominal pain, diarrhea, nausea and vomiting.  Genitourinary: Negative for dysuria.  Musculoskeletal: Negative for back pain and joint pain.  Psychiatric/Behavioral: Negative for depression. The patient is not nervous/anxious.     Past Medical History:  Diagnosis Date  . Folliculitis 04/30/4008  . HIV (human immunodeficiency virus infection) ([place])   . Pneumothorax     Social History   Tobacco Use  . Smoking status: Former Smoker    Packs/day: 0.25    Years: 0.00    Pack years: 0.00    Types: Cigarettes    Quit date: 04/09/2018    Years since quitting: 1.4  . Smokeless tobacco: Never Used  . Tobacco comment: trying to quit  Vaping Use  . Vaping Use: Never used  Substance Use Topics  . Alcohol use: Yes    Alcohol/week: 0.0 standard drinks    Comment: daily  . Drug use: Not Currently    Types: Cocaine    Comment: last used 2 weeks ago     Family History  Problem Relation Age of Onset  . Hypertension Other     No Known Allergies  Health Maintenance  Topic Date Due  . COVID-19 Vaccine (1) Never William [name]  . TETANUS/TDAP  Never William [name]  . COLONOSCOPY  Never William [name]  . INFLUENZA VACCINE  09/08/2019  . Hepatitis C  Screening  Completed  . HIV Screening  Completed    Objective:  Vitals:   09/24/19 0917  BP: 113/79  Pulse: 81  Temp: 98.5 F (36.9 C)  TempSrc: Oral  Weight: 183 lb (83 kg)   Body mass index is 25.52 kg/m.  Physical Exam Constitutional:      Comments: He is pleasant and in no distress.  Cardiovascular:     Rate and Rhythm: Normal rate and regular rhythm.     Heart sounds: No murmur heard.   Pulmonary:     Effort: Pulmonary effort is normal.     Breath sounds: Normal breath sounds.  Psychiatric:        Mood and Affect: Mood normal.     Lab Results Lab Results  Component Value Date   WBC 5.0 02/27/2019   HGB 13.7 02/27/2019   HCT 41.7 02/27/2019   MCV 87.2 02/27/2019   PLT 270 02/27/2019    Lab  Results  Component Value Date   CREATININE 0.81 02/27/2019   BUN 8 02/27/2019   NA 143 02/27/2019   K 4.3 02/27/2019   CL 107 02/27/2019   CO2 29 02/27/2019    Lab Results  Component Value Date   ALT 32 02/27/2019   AST 27 02/27/2019   ALKPHOS 69 11/04/2016   BILITOT 0.4 02/27/2019    Lab Results  Component Value Date   CHOL 154 02/27/2019   HDL 62 02/27/2019   LDLCALC 73 02/27/2019   TRIG 111 02/27/2019   CHOLHDL 2.5 02/27/2019   Lab Results  Component Value Date   LABRPR NON-REACTIVE 02/27/2019   RPRTITER 1:1 (H) 04/04/2017   HIV 1 RNA Quant  Date Value  09/10/2019 23 Copies/mL (H)  02/27/2019 <20 NOT DETECTED copies/mL  11/13/2018 <20 DETECTED copies/mL (A)   CD4 T Cell Abs (/uL)  Date Value  09/10/2019 207 (L)  02/27/2019 172 (L)  11/13/2018 190 (L)     Problem List Items Addressed This Visit      High   Human immunodeficiency virus (HIV) disease ([place])    His HIV infection remains under very good, long-term control.  He will continue Biktarvy and follow-up after lab work in 6 months.      Relevant Orders   T-helper cell (CD4)- ([facility] clinic only)   HIV-1 RNA quant-no reflex-bld   CBC   Comprehensive metabolic panel   RPR        William Bickers, MD [facility] [place] [phone] pager   [phone] cell 09/24/2019, 9:35 AM

## 2019-10-23 MED FILL — BIKTARVY 50-200-25 MG TABS: 50-200-25 | 30 days supply | Qty: 30 | Fill #0

## 2019-12-02 ENCOUNTER — Telehealth: Payer: Self-pay

## 2019-12-02 NOTE — Telephone Encounter (Signed)
[facility] Patient Advocate Encounter  [facility] and I have been unsuccsessful in reaching patient to be able to refill medication.    We have tried multiple times without a response.  Pegge Cumberledge, CPhT Specialty Pharmacy Patient [facility] Phone: [phone] Fax:  [phone]  

## 2019-12-11 ENCOUNTER — Other Ambulatory Visit: Payer: Self-pay | Admitting: Internal Medicine

## 2019-12-11 DIAGNOSIS — B20 Human immunodeficiency virus [HIV] disease: Secondary | ICD-10-CM

## 2019-12-12 MED FILL — BIKTARVY 50-200-25 MG TABS: 50-200-25 | 30 days supply | Qty: 30 | Fill #0

## 2019-12-16 ENCOUNTER — Other Ambulatory Visit: Payer: Self-pay | Admitting: Internal Medicine

## 2019-12-16 ENCOUNTER — Other Ambulatory Visit (HOSPITAL_COMMUNITY): Payer: Self-pay | Admitting: Internal Medicine

## 2019-12-16 DIAGNOSIS — B20 Human immunodeficiency virus [HIV] disease: Secondary | ICD-10-CM

## 2019-12-18 ENCOUNTER — Encounter: Payer: Self-pay | Admitting: Podiatry

## 2019-12-18 ENCOUNTER — Other Ambulatory Visit: Payer: Self-pay

## 2019-12-18 ENCOUNTER — Ambulatory Visit (INDEPENDENT_AMBULATORY_CARE_PROVIDER_SITE_OTHER): Payer: Medicare Other | Admitting: Podiatry

## 2019-12-18 DIAGNOSIS — M79674 Pain in right toe(s): Secondary | ICD-10-CM

## 2019-12-18 DIAGNOSIS — B351 Tinea unguium: Secondary | ICD-10-CM | POA: Diagnosis not present

## 2019-12-18 DIAGNOSIS — M79675 Pain in left toe(s): Secondary | ICD-10-CM | POA: Diagnosis not present

## 2019-12-22 NOTE — Progress Notes (Signed)
Subjective:  Patient ID: William [name], male    DOB: 29-Dec-1960,  MRN: [id number]  RICCI [name] presents to clinic today for painful thick toenails that are difficult to trim. Pain interferes with ambulation. Aggravating factors include wearing enclosed shoe gear. Pain is relieved with periodic professional debridement..  59 y.o. male presents with the above complaint.    Review of Systems: Negative except as noted in the HPI. Past Medical History:  Diagnosis Date  . Folliculitis 0/86/5784  . HIV (human immunodeficiency virus infection) ([place])   . Pneumothorax    Past Surgical History:  Procedure Laterality Date  . CHEST TUBE INSERTION    . ESOPHAGOGASTRODUODENOSCOPY (EGD) WITH PROPOFOL N/A 08/29/2016   Procedure: ESOPHAGOGASTRODUODENOSCOPY (EGD) WITH PROPOFOL;  Surgeon: Otis Brace, MD;  Location: [facility];  Service: Gastroenterology;  Laterality: N/A;  . MULTIPLE EXTRACTIONS WITH ALVEOLOPLASTY N/A 05/23/2014   Procedure: EXTRACTIONS OF ALL REMAINING TEETH WITH ALVEOLOPLASTY, REMOVAL OF LEFT OSSEOUS EXOTOSIS, REMOVAL MAXILLARY OSSEOUS TUBEROSITY;  Surgeon: Diona Browner, DDS;  Location: [facility];  Service: Oral Surgery;  Laterality: N/A;  . PLEURADESIS Left 04/26/2013   Procedure: PLEURADESIS;  Surgeon: Gaye Pollack, MD;  Location: [facility] OR;  Service: Thoracic;  Laterality: Left;  Mechanical  . STAPLING OF BLEBS Left 04/26/2013   Procedure: STAPLING OF BLEBS;  Surgeon: Gaye Pollack, MD;  Location: [facility];  Service: Thoracic;  Laterality: Left;  Marland Kitchen VIDEO ASSISTED THORACOSCOPY Left 04/26/2013   Procedure: VIDEO ASSISTED THORACOSCOPY;  Surgeon: Gaye Pollack, MD;  Location: [facility] OR;  Service: Thoracic;  Laterality: Left;    Current Outpatient Medications:  .  BIKTARVY 50-200-25 MG TABS tablet, TAKE 1 TABLET BY MOUTH DAILY, Disp: 30 tablet, Rfl: 2 .  clotrimazole-betamethasone (LOTRISONE) cream, Apply to both feet and between toes bid x 4 weeks., Disp: 45 g, Rfl: 1 .   hydrochlorothiazide (MICROZIDE) 12.5 MG capsule, Take 1 capsule (12.5 mg total) by mouth daily., Disp: 30 capsule, Rfl: 11 .  loratadine (CLARITIN) 10 MG tablet, Take 1 tablet (10 mg total) by mouth daily., Disp: 30 tablet, Rfl: 6 .  naltrexone (DEPADE) 50 MG tablet, Take 0.5 tablets (25 mg total) by mouth daily., Disp: 30 tablet, Rfl: 3 .  Pitavastatin Calcium 4 MG TABS, Take 4 mg by mouth daily. This may be placebo and is study provided. Do not fill prescription., Disp: , Rfl:  .  triamcinolone cream (KENALOG) 0.1 %, apply to affected area twice a day, Disp: 30 g, Rfl: 5 .  VRAYLAR capsule, , Disp: , Rfl:  No Known Allergies Social History   Occupational History  . Not on file  Tobacco Use  . Smoking status: Former Smoker    Packs/day: 0.25    Years: 0.00    Pack years: 0.00    Types: Cigarettes    Quit date: 04/09/2018    Years since quitting: 1.7  . Smokeless tobacco: Never Used  . Tobacco comment: trying to quit  Vaping Use  . Vaping Use: Never used  Substance and Sexual Activity  . Alcohol use: Yes    Alcohol/week: 0.0 standard drinks    Comment: daily  . Drug use: Not Currently    Types: Cocaine    Comment: last used 2 weeks ago   . Sexual activity: Yes    Partners: Female    Comment: declined condoms    Objective:   Constitutional William [name] is a pleasant 59 y.o. African American male, WD, WN in NAD. AAO x 3.  Vascular Capillary refill time to digits immediate b/l. Palpable pedal pulses b/l LE. Pedal hair absent. Lower extremity skin temperature gradient within normal limits. No pain with calf compression b/l. No edema noted b/l lower extremities. No cyanosis or clubbing noted.  Neurologic Normal speech. Oriented to person, place, and time. Protective sensation intact 5/5 intact bilaterally with 10g monofilament b/l. Vibratory sensation intact b/l.  Dermatologic Pedal skin with normal turgor, texture and tone bilaterally. No open wounds bilaterally. No  interdigital macerations bilaterally. Toenails 1-5 b/l elongated, discolored, dystrophic, thickened, crumbly with subungual debris and tenderness to dorsal palpation.  Orthopedic: Normal muscle strength 5/5 to all lower extremity muscle groups bilaterally. No pain crepitus or joint limitation noted with ROM b/l. No gross bony deformities bilaterally. Patient ambulates independent of any assistive aids.   Radiographs: None Assessment:   1. Pain due to onychomycosis of toenails of both feet    Plan:  Patient was evaluated and treated and all questions answered.  Onychomycosis with pain -Nails palliatively debridement as below -Educated on self-care  Procedure: Nail Debridement Rationale: Pain Type of Debridement: manual, sharp debridement. Instrumentation: Nail nipper, rotary burr. Number of Nails: 10 -Examined patient. -No new findings. No new orders. -Patient to continue soft, supportive shoe gear daily. -Toenails 1-5 b/l were debrided in length and girth with sterile nail nippers and dremel without iatrogenic bleeding.  -Patient to report any pedal injuries to medical professional immediately. -Patient/POA to call should there be question/concern in the interim.  No follow-ups on file.  Marzetta Board, DPM

## 2020-01-10 ENCOUNTER — Telehealth: Payer: Self-pay

## 2020-01-10 NOTE — Telephone Encounter (Signed)
[facility] Patient Advocate Encounter  [facility] and I have been unsuccsessful in reaching patient to be able to refill medication.    We have tried multiple times without a response.  Husayn Reim, CPhT Specialty Pharmacy Patient [facility] Phone: [phone] Fax:  [phone]  

## 2020-01-14 ENCOUNTER — Encounter (INDEPENDENT_AMBULATORY_CARE_PROVIDER_SITE_OTHER): Payer: Self-pay | Admitting: *Deleted

## 2020-01-14 ENCOUNTER — Other Ambulatory Visit: Payer: Self-pay

## 2020-01-14 VITALS — BP 113/78 | HR 76 | Temp 97.6°F | Wt 186.1 lb

## 2020-01-14 DIAGNOSIS — Z006 Encounter for examination for normal comparison and control in clinical research program: Secondary | ICD-10-CM

## 2020-01-14 NOTE — Research (Signed)
Gen here today for his month 7 Reprieve visit. No new complaints or medications. Verbalized excellent adherence with his medications. Denied any muscle aches or weakness. He will return in February to see Dr. Megan Salon and then in April for his next study visit. He is scheduled to return to the clinic 01/17/20 to receive his Covid-19 vaccine booster.

## 2020-01-17 ENCOUNTER — Ambulatory Visit (INDEPENDENT_AMBULATORY_CARE_PROVIDER_SITE_OTHER): Payer: Medicare Other

## 2020-01-17 ENCOUNTER — Other Ambulatory Visit: Payer: Self-pay

## 2020-01-17 DIAGNOSIS — Z23 Encounter for immunization: Secondary | ICD-10-CM | POA: Diagnosis not present

## 2020-01-17 NOTE — Progress Notes (Signed)
   Covid-19 Vaccination Clinic  Name:  ARIHAAN BELLUCCI    MRN: [id number] DOB: 1961/02/07  01/17/2020  Mr. Varma was observed post Covid-19 immunization for 15 minutes without incident. He was provided with Vaccine Information Sheet and instruction to access the V-Safe system.   Mr. Palka was instructed to call 911 with any severe reactions post vaccine: Marland Kitchen Difficulty breathing  . Swelling of face and throat  . A fast heartbeat  . A bad rash all over body  . Dizziness and weakness   Immunizations Administered    Name Date Dose VIS Date Route   JANSSEN COVID-19 VACCINE 01/17/2020  9:36 AM 0.5 mL 11/27/2019 Intramuscular   Manufacturer: [employer]   Lot: [id number]   [place]: [place] Diedre Maclellan, RN

## 2020-02-17 MED FILL — BIKTARVY 50-200-25 MG TABS: 50-200-25 | 30 days supply | Qty: 30 | Fill #0

## 2020-03-12 ENCOUNTER — Other Ambulatory Visit: Payer: Self-pay

## 2020-03-12 ENCOUNTER — Other Ambulatory Visit: Payer: Medicare Other

## 2020-03-12 DIAGNOSIS — B20 Human immunodeficiency virus [HIV] disease: Secondary | ICD-10-CM | POA: Diagnosis not present

## 2020-03-13 LAB — T-HELPER CELL (CD4) - (RCID CLINIC ONLY)
CD4 % Helper T Cell: 17 % — ABNORMAL LOW (ref 33–65)
CD4 T Cell Abs: 282 /uL — ABNORMAL LOW (ref 400–1790)

## 2020-03-16 LAB — COMPREHENSIVE METABOLIC PANEL
AG Ratio: 1.5 (calc) (ref 1.0–2.5)
ALT: 42 U/L (ref 9–46)
AST: 40 U/L — ABNORMAL HIGH (ref 10–35)
Albumin: 5 g/dL (ref 3.6–5.1)
Alkaline phosphatase (APISO): 67 U/L (ref 35–144)
BUN: 13 mg/dL (ref 7–25)
CO2: 25 mmol/L (ref 20–32)
Calcium: 9.7 mg/dL (ref 8.6–10.3)
Chloride: 102 mmol/L (ref 98–110)
Creat: 0.81 mg/dL (ref 0.70–1.33)
Globulin: 3.3 g/dL (calc) (ref 1.9–3.7)
Glucose, Bld: 70 mg/dL (ref 65–99)
Potassium: 4.3 mmol/L (ref 3.5–5.3)
Sodium: 137 mmol/L (ref 135–146)
Total Bilirubin: 0.6 mg/dL (ref 0.2–1.2)
Total Protein: 8.3 g/dL — ABNORMAL HIGH (ref 6.1–8.1)

## 2020-03-16 LAB — RPR: RPR Ser Ql: NONREACTIVE

## 2020-03-16 LAB — HIV-1 RNA QUANT-NO REFLEX-BLD
HIV 1 RNA Quant: 55 Copies/mL — ABNORMAL HIGH
HIV-1 RNA Quant, Log: 1.74 Log cps/mL — ABNORMAL HIGH

## 2020-03-16 LAB — CBC
HCT: 44.2 % (ref 38.5–50.0)
Hemoglobin: 15 g/dL (ref 13.2–17.1)
MCH: 30.4 pg (ref 27.0–33.0)
MCHC: 33.9 g/dL (ref 32.0–36.0)
MCV: 89.5 fL (ref 80.0–100.0)
MPV: 9.4 fL (ref 7.5–12.5)
Platelets: 307 10*3/uL (ref 140–400)
RBC: 4.94 10*6/uL (ref 4.20–5.80)
RDW: 13.1 % (ref 11.0–15.0)
WBC: 6.4 10*3/uL (ref 3.8–10.8)

## 2020-03-23 ENCOUNTER — Other Ambulatory Visit: Payer: Self-pay

## 2020-03-23 ENCOUNTER — Ambulatory Visit (INDEPENDENT_AMBULATORY_CARE_PROVIDER_SITE_OTHER): Payer: Medicare Other | Admitting: Podiatry

## 2020-03-23 ENCOUNTER — Encounter: Payer: Self-pay | Admitting: Podiatry

## 2020-03-23 DIAGNOSIS — M79674 Pain in right toe(s): Secondary | ICD-10-CM

## 2020-03-23 DIAGNOSIS — M79675 Pain in left toe(s): Secondary | ICD-10-CM | POA: Diagnosis not present

## 2020-03-23 DIAGNOSIS — B351 Tinea unguium: Secondary | ICD-10-CM

## 2020-03-26 ENCOUNTER — Other Ambulatory Visit: Payer: Self-pay

## 2020-03-26 ENCOUNTER — Encounter: Payer: Self-pay | Admitting: Internal Medicine

## 2020-03-26 ENCOUNTER — Ambulatory Visit (INDEPENDENT_AMBULATORY_CARE_PROVIDER_SITE_OTHER): Payer: Medicare Other | Admitting: Internal Medicine

## 2020-03-26 ENCOUNTER — Other Ambulatory Visit (HOSPITAL_COMMUNITY): Payer: Self-pay | Admitting: Internal Medicine

## 2020-03-26 ENCOUNTER — Telehealth: Payer: Self-pay

## 2020-03-26 VITALS — BP 131/92 | HR 88 | Temp 97.8°F | Wt 188.0 lb

## 2020-03-26 DIAGNOSIS — B182 Chronic viral hepatitis C: Secondary | ICD-10-CM | POA: Diagnosis present

## 2020-03-26 DIAGNOSIS — B20 Human immunodeficiency virus [HIV] disease: Secondary | ICD-10-CM

## 2020-03-26 DIAGNOSIS — Z23 Encounter for immunization: Secondary | ICD-10-CM | POA: Diagnosis not present

## 2020-03-26 DIAGNOSIS — F1020 Alcohol dependence, uncomplicated: Secondary | ICD-10-CM | POA: Diagnosis not present

## 2020-03-26 DIAGNOSIS — F17210 Nicotine dependence, cigarettes, uncomplicated: Secondary | ICD-10-CM | POA: Diagnosis not present

## 2020-03-26 MED ORDER — BIKTARVY 50-200-25 MG PO TABS: 1.0000 | ORAL_TABLET | Freq: Every day | ORAL | 11 refills | Status: DC

## 2020-03-26 NOTE — Assessment & Plan Note (Signed)
He indicates that he has dramatically decreased his alcohol intake recently.

## 2020-03-26 NOTE — Assessment & Plan Note (Signed)
His infection has been under very good, long-term control.  His mother wanted him to ask about injectable cabotegravir/rilpivirine.  I told him that currently there is nothing that is more effective, safer or simpler than his once daily Biktarvy.  He will continue that and follow-up after lab work in 6 months.  He received his influenza vaccine here today.

## 2020-03-26 NOTE — Progress Notes (Signed)
Patient Active Problem List   Diagnosis Date Noted  . Human immunodeficiency virus (HIV) disease ([place]) 07/10/2006    Priority: High  . Chronic hepatitis C without hepatic coma ([place]) 07/10/2006    Priority: Medium  . Hypertension 03/27/2019  . Grief 03/27/2019  . GERD with esophagitis 09/01/2016  . Cigarette smoker 12/23/2014  . Spontaneous pneumothorax 04/24/2013  . Seasonal allergies 01/22/2013  . PNEUMOCYSTIS PNEUMONIA 07/10/2006  . Alcohol use disorder, moderate, dependence ([place]) 07/10/2006    Patient's Medications  New Prescriptions   No medications on file  Previous Medications   CLOTRIMAZOLE-BETAMETHASONE (LOTRISONE) CREAM    Apply to both feet and between toes bid x 4 weeks.   HYDROCHLOROTHIAZIDE (MICROZIDE) 12.5 MG CAPSULE    Take 1 capsule (12.5 mg total) by mouth daily.   LORATADINE (CLARITIN) 10 MG TABLET    Take 1 tablet (10 mg total) by mouth daily.   NALTREXONE (DEPADE) 50 MG TABLET    Take 0.5 tablets (25 mg total) by mouth daily.   PITAVASTATIN CALCIUM 4 MG TABS    Take 4 mg by mouth daily. This may be placebo and is study provided. Do not fill prescription.   TRIAMCINOLONE CREAM (KENALOG) 0.1 %    apply to affected area twice a day   VRAYLAR CAPSULE      Modified Medications   Modified Medication Previous Medication   BICTEGRAVIR-EMTRICITABINE-TENOFOVIR AF (BIKTARVY) 50-200-25 MG TABS TABLET BIKTARVY 50-200-25 MG TABS tablet      Take 1 tablet by mouth daily.    TAKE 1 TABLET BY MOUTH DAILY  Discontinued Medications   No medications on file    Subjective: William [name] is in for his routine HIV follow-up visit.  He denies any problems obtaining, taking or tolerating his Biktarvy and cannot recall missing any doses.  He continues to smoke a few cigarettes daily.  He would like to quit but has no current plan to quit.  He says he has cut back on his alcohol intake and is only drinking a few beers daily.  He denies feeling anxious or depressed.  He received  his [place] booster in December.  Review of Systems: Review of Systems  Constitutional: Negative for chills, diaphoresis, fever, malaise/fatigue and weight loss.  HENT: Negative for sore throat.   Respiratory: Negative for cough, sputum production and shortness of breath.   Cardiovascular: Negative for chest pain.  Gastrointestinal: Negative for abdominal pain, diarrhea, heartburn, nausea and vomiting.  Genitourinary: Negative for dysuria and frequency.  Musculoskeletal: Negative for joint pain and myalgias.  Skin: Negative for rash.  Neurological: Negative for dizziness and headaches.  Psychiatric/Behavioral: Negative for depression and substance abuse. The patient is not nervous/anxious.     Past Medical History:  Diagnosis Date  . Folliculitis 4/96/7591  . HIV (human immunodeficiency virus infection) ([place])   . Pneumothorax     Social History   Tobacco Use  . Smoking status: Former Smoker    Packs/day: 0.25    Years: 0.00    Pack years: 0.00    Types: Cigarettes    Quit date: 04/09/2018    Years since quitting: 1.9  . Smokeless tobacco: Never Used  . Tobacco comment: trying to quit  Vaping Use  . Vaping Use: Never used  Substance Use Topics  . Alcohol use: Yes    Alcohol/week: 0.0 standard drinks    Comment: daily  . Drug use: Not Currently    Types: Cocaine  Comment: last used 2 weeks ago     Family History  Problem Relation Age of Onset  . Hypertension Other     No Known Allergies  Health Maintenance  Topic Date Due  . TETANUS/TDAP  Never done  . COLONOSCOPY (Pts 45-78yrs Insurance coverage will need to be confirmed)  Never done  . INFLUENZA VACCINE  Completed  . COVID-19 Vaccine  Completed  . Hepatitis C Screening  Completed  . HIV Screening  Completed    Objective:  Vitals:   03/26/20 0949  BP: (!) 131/92  Pulse: 88  Temp: 97.8 F (36.6 C)  TempSrc: Oral  Weight: 188 lb (85.3 kg)   Body mass index is 26.22 kg/m.  Physical  Exam Constitutional:      Comments: His spirits are good.  Cardiovascular:     Rate and Rhythm: Normal rate and regular rhythm.     Heart sounds: No murmur heard.   Pulmonary:     Effort: Pulmonary effort is normal.     Breath sounds: Normal breath sounds.  Abdominal:     Palpations: Abdomen is soft.     Tenderness: There is no abdominal tenderness.  Musculoskeletal:        General: No swelling or tenderness.  Skin:    Findings: No rash.  Psychiatric:        Mood and Affect: Mood normal.     Lab Results Lab Results  Component Value Date   WBC 6.4 03/12/2020   HGB 15.0 03/12/2020   HCT 44.2 03/12/2020   MCV 89.5 03/12/2020   PLT 307 03/12/2020    Lab Results  Component Value Date   CREATININE 0.81 03/12/2020   BUN 13 03/12/2020   NA 137 03/12/2020   K 4.3 03/12/2020   CL 102 03/12/2020   CO2 25 03/12/2020    Lab Results  Component Value Date   ALT 42 03/12/2020   AST 40 (H) 03/12/2020   ALKPHOS 69 11/04/2016   BILITOT 0.6 03/12/2020    Lab Results  Component Value Date   CHOL 154 02/27/2019   HDL 62 02/27/2019   LDLCALC 73 02/27/2019   TRIG 111 02/27/2019   CHOLHDL 2.5 02/27/2019   Lab Results  Component Value Date   LABRPR NON-REACTIVE 03/12/2020   RPRTITER 1:1 (H) 04/04/2017   HIV 1 RNA Quant  Date Value  03/12/2020 55 Copies/mL (H)  09/10/2019 23 Copies/mL (H)  02/27/2019 <20 NOT DETECTED copies/mL   CD4 T Cell Abs (/uL)  Date Value  03/12/2020 282 (L)  09/10/2019 207 (L)  02/27/2019 172 (L)     Problem List Items Addressed This Visit      High   Human immunodeficiency virus (HIV) disease ([place])    His infection has been under very good, long-term control.  His mother wanted him to ask about injectable cabotegravir/rilpivirine.  I told him that currently there is nothing that is more effective, safer or simpler than his once daily Biktarvy.  He will continue that and follow-up after lab work in 6 months.  He received his influenza vaccine  here today.      Relevant Medications   bictegravir-emtricitabine-tenofovir AF (BIKTARVY) 50-200-25 MG TABS tablet   Other Relevant Orders   T-helper cell (CD4)- ([facility] clinic only)   HIV-1 RNA quant-no reflex-bld     Medium   Chronic hepatitis C without hepatic coma (HCC)   Relevant Medications   bictegravir-emtricitabine-tenofovir AF (BIKTARVY) 50-200-25 MG TABS tablet     Unprioritized  Alcohol use disorder, moderate, dependence ([place])    He indicates that he has dramatically decreased his alcohol intake recently.      Cigarette smoker    I asked him to consider setting a quit date and making a strong attempt to quit smoking again.       Other Visit Diagnoses    Need for immunization against influenza    -  Primary   Relevant Orders   Flu Vaccine QUAD 36+ mos IM (Completed)        Michel Bickers, MD [facility] [place] [phone] pager   [phone] cell 03/26/2020, 10:16 AM

## 2020-03-26 NOTE — Telephone Encounter (Signed)
CMA attempted to call patient to confirm appointment for today. If patient would like to convert appointment to e visit; okay per provider.

## 2020-03-26 NOTE — Assessment & Plan Note (Signed)
I asked him to consider setting a quit date and making a strong attempt to quit smoking again.

## 2020-03-29 NOTE — Progress Notes (Signed)
  Subjective:  Patient ID: William [name], male    DOB: 06/03/1960,  MRN: [id number]  William [name] presents to clinic today for painful thick toenails that are difficult to trim. Pain interferes with ambulation. Aggravating factors include wearing enclosed shoe gear. Pain is relieved with periodic professional debridement.  He voices no new pedal problems on today's visit.  Review of Systems: Negative except as noted in the HPI  .No Known Allergies    Objective:   Constitutional William [name] is a pleasant 61 y.o. African American male, WD, WN in NAD. AAO x 3.   Vascular Capillary refill time to digits immediate b/l. Palpable pedal pulses b/l LE. Pedal hair absent. Lower extremity skin temperature gradient within normal limits. No pain with calf compression b/l. No edema noted b/l lower extremities. No cyanosis or clubbing noted.  Neurologic Normal speech. Oriented to person, place, and time. Protective sensation intact 5/5 intact bilaterally with 10g monofilament b/l. Vibratory sensation intact b/l.  Dermatologic Pedal skin with normal turgor, texture and tone bilaterally. No open wounds bilaterally. No interdigital macerations bilaterally. Toenails 1-5 b/l elongated, discolored, dystrophic, thickened, crumbly with subungual debris and tenderness to dorsal palpation.  Orthopedic: Normal muscle strength 5/5 to all lower extremity muscle groups bilaterally. No pain crepitus or joint limitation noted with ROM b/l. No gross bony deformities bilaterally. Patient ambulates independent of any assistive aids.   Radiographs: None Assessment:   1. Pain due to onychomycosis of toenails of both feet    Plan:  Patient was evaluated and treated and all questions answered.  Onychomycosis with pain -Nails palliatively debridement as below -Educated on self-care  Procedure: Nail Debridement Rationale: Pain Type of Debridement: manual, sharp debridement. Instrumentation: Nail nipper,  rotary burr. Number of Nails: 10 -Examined patient. -No new findings. No new orders. -Patient to continue soft, supportive shoe gear daily. -Toenails 1-5 b/l were debrided in length and girth with sterile nail nippers and dremel without iatrogenic bleeding.  -Patient to report any pedal injuries to medical professional immediately. -Patient/POA to call should there be question/concern in the interim.  Return in about 3 months (around 06/20/2020).  Marzetta Board, DPM

## 2020-04-07 MED FILL — BIKTARVY 50-200-25 MG TABS: 50-200-25 | 30 days supply | Qty: 30 | Fill #0

## 2020-04-20 DIAGNOSIS — Z20828 Contact with and (suspected) exposure to other viral communicable diseases: Secondary | ICD-10-CM | POA: Diagnosis not present

## 2020-04-20 DIAGNOSIS — Z1159 Encounter for screening for other viral diseases: Secondary | ICD-10-CM | POA: Diagnosis not present

## 2020-04-24 ENCOUNTER — Telehealth: Payer: Self-pay

## 2020-04-24 NOTE — Telephone Encounter (Signed)
Received call today from Abigail Butts, Case Manager with [facility] requesting labs for patient. Faxed labs as requested. Abigail Butts: [place]

## 2020-05-05 ENCOUNTER — Other Ambulatory Visit (HOSPITAL_COMMUNITY): Payer: Self-pay

## 2020-05-14 ENCOUNTER — Encounter (INDEPENDENT_AMBULATORY_CARE_PROVIDER_SITE_OTHER): Payer: Self-pay | Admitting: *Deleted

## 2020-05-14 ENCOUNTER — Other Ambulatory Visit: Payer: Self-pay

## 2020-05-14 VITALS — BP 120/80 | HR 76 | Temp 98.0°F | Wt 191.1 lb

## 2020-05-14 DIAGNOSIS — Z006 Encounter for examination for normal comparison and control in clinical research program: Secondary | ICD-10-CM

## 2020-05-14 NOTE — Research (Signed)
Fermon here today for his month 51 Reprieve visit. No new complaints. Verbalized excellent adherence with his ARV and study medications. Denied any muscle aches or weakness. States that he is no longer taking HCTZ or naltrexone. States that his PCP stopped both medications last year, unsure exactly when he last took these medications. BP 120/80 today. Has an appointment scheduled with PCP for 18Apr2022. He will return in August for his next study visit.

## 2020-05-15 ENCOUNTER — Other Ambulatory Visit (HOSPITAL_COMMUNITY): Payer: Self-pay

## 2020-05-15 MED FILL — Bictegravir-Emtricitabine-Tenofovir AF Tab 50-200-25 MG: ORAL | 30 days supply | Qty: 30 | Fill #0 | Status: AC

## 2020-05-18 ENCOUNTER — Other Ambulatory Visit (HOSPITAL_COMMUNITY): Payer: Self-pay

## 2020-05-25 ENCOUNTER — Ambulatory Visit (INDEPENDENT_AMBULATORY_CARE_PROVIDER_SITE_OTHER): Payer: Medicare Other | Admitting: Internal Medicine

## 2020-05-25 ENCOUNTER — Other Ambulatory Visit: Payer: Self-pay

## 2020-05-25 ENCOUNTER — Encounter: Payer: Self-pay | Admitting: Internal Medicine

## 2020-05-25 ENCOUNTER — Other Ambulatory Visit (HOSPITAL_COMMUNITY): Payer: Self-pay

## 2020-05-25 VITALS — BP 133/87 | HR 80 | Temp 97.6°F | Ht 71.0 in | Wt 193.3 lb

## 2020-05-25 DIAGNOSIS — I10 Essential (primary) hypertension: Secondary | ICD-10-CM | POA: Diagnosis present

## 2020-05-25 DIAGNOSIS — F1020 Alcohol dependence, uncomplicated: Secondary | ICD-10-CM | POA: Diagnosis not present

## 2020-05-25 DIAGNOSIS — Z1211 Encounter for screening for malignant neoplasm of colon: Secondary | ICD-10-CM | POA: Insufficient documentation

## 2020-05-25 MED ORDER — NALTREXONE HCL 50 MG PO TABS
25.0000 mg | ORAL_TABLET | Freq: Every day | ORAL | 1 refills | Status: DC
  Filled 2020-05-25: qty 30, 60d supply, fill #0
  Filled 2020-08-13: qty 30, 60d supply, fill #1

## 2020-05-25 MED ORDER — HYDROCHLOROTHIAZIDE 12.5 MG PO CAPS
12.5000 mg | ORAL_CAPSULE | Freq: Every day | ORAL | 5 refills | Status: DC
  Filled 2020-05-25: qty 30, 30d supply, fill #0
  Filled 2020-08-13: qty 30, 30d supply, fill #1
  Filled 2020-10-07: qty 30, 30d supply, fill #2
  Filled 2021-02-18: qty 30, 30d supply, fill #3
  Filled 2021-05-06: qty 30, 30d supply, fill #4

## 2020-05-25 NOTE — Patient Instructions (Signed)
Thank you for trusting me with your care. To recap, today we discussed the following:   1. Essential hypertension  - hydrochlorothiazide (MICROZIDE) 12.5 MG capsule; Take 1 capsule (12.5 mg total) by mouth daily.  Dispense: 30 capsule; Refill: 5  2. Alcohol use disorder, moderate, dependence (HCC)  - naltrexone (DEPADE) 50 MG tablet; Take 0.5 tablets (25 mg total) by mouth daily.  Dispense: 30 tablet; Refill: 1  3. Screening for colon cancer  - Ambulatory referral to Gastroenterology

## 2020-05-25 NOTE — Assessment & Plan Note (Signed)
Denies a family history of colon cancer. He denies ever having a screening colonoscopy and is age 60.  Assessment: Screening for colon cancer Plan: - Ambulatory referral to Gastroenterology

## 2020-05-25 NOTE — Assessment & Plan Note (Signed)
Hypertension: Patient's BP today is 133/87 with a goal of <140/80. The patient endorses adherence to his medication regimen, but recently ran out of medication . Denies any lightheaded feelings or dizziness when taking medications.   Plan: Continue HCTZ 12.5 mg daily

## 2020-05-25 NOTE — Assessment & Plan Note (Signed)
Reports he has been out of naltrexone and was drinking up to 12 beers a day. He is seeing a Social worker and only drinking 2 beers a day this week. He would like Naltrexone refilled to help with craviings.   Alcohol use disorder, moderate, dependence (HCC) - naltrexone (DEPADE) 50 MG tablet; Take one-half tablet  (25 mg total) by mouth daily.  Dispense: 30 tablet; Refill: 1

## 2020-05-25 NOTE — Progress Notes (Signed)
   CC: high blood pressure, alcohol use disorder , screening for colon cancer   HPI:[name] is a 60 y.o. male who presents for evaluation of high blood pressure, alcohol use disorder, and screening for colon cancer. Please see individual problem based A/P for details.   Past Medical History:  Diagnosis Date  . Folliculitis 3/70/4888  . HIV (human immunodeficiency virus infection) ([place])   . Pneumothorax    Review of Systems:   Review of Systems  Constitutional: Negative for chills and fever.  Neurological: Negative for dizziness and headaches.     Physical Exam: Vitals:   05/25/20 1341  BP: 133/87  Pulse: 80  Temp: 97.6 F (36.4 C)  TempSrc: Oral  SpO2: 98%  Weight: 193 lb 4.8 oz (87.7 kg)  Height: 5\' 11"  (1.803 m)   General: NAD, middle age man  HEENT: Normocephalic, atraumatic , Conjunctiva nl  Cardiovascular: Normal rate, regular rhythm.  No murmurs, rubs, or gallops Pulmonary : Equal breath sounds, No wheezes, rales, or rhonchi Abdominal: soft, nontender,  bowel sounds present   Assessment & Plan:   See Encounters Tab for problem based charting.  Patient discussed with Dr. Evette Doffing

## 2020-05-26 ENCOUNTER — Other Ambulatory Visit (HOSPITAL_COMMUNITY): Payer: Self-pay

## 2020-05-26 NOTE — Progress Notes (Signed)
Internal Medicine Clinic Attending  Case discussed with Dr. Steen  At the time of the visit.  We reviewed the resident's history and exam and pertinent patient test results.  I agree with the assessment, diagnosis, and plan of care documented in the resident's note.  

## 2020-05-27 ENCOUNTER — Other Ambulatory Visit (HOSPITAL_COMMUNITY): Payer: Self-pay

## 2020-06-10 ENCOUNTER — Other Ambulatory Visit (HOSPITAL_COMMUNITY): Payer: Self-pay

## 2020-06-12 ENCOUNTER — Other Ambulatory Visit (HOSPITAL_COMMUNITY): Payer: Self-pay

## 2020-06-15 ENCOUNTER — Other Ambulatory Visit (HOSPITAL_COMMUNITY): Payer: Self-pay

## 2020-06-19 ENCOUNTER — Encounter: Payer: Self-pay | Admitting: Internal Medicine

## 2020-06-20 ENCOUNTER — Encounter: Payer: Self-pay | Admitting: *Deleted

## 2020-06-20 NOTE — Progress Notes (Signed)

## 2020-06-22 NOTE — Progress Notes (Signed)
Things That May Be Affecting Your Health:  Alcohol  Hearing loss  Pain    Depression + Home Safety  Sexual Health   Diabetes  Lack of physical activity  Stress  + Difficulty with daily activities  Loneliness  Tiredness   Drug use + Medicines  Tobacco use   Falls  Motor Vehicle Safety  Weight   Food choices  Oral Health  Other    YOUR PERSONALIZED HEALTH PLAN : 1. Schedule your next subsequent Medicare Wellness visit in one year 2. Attend all of your regular appointments to address your medical issues 3. Complete the preventative screenings and services   Annual Wellness Visit   Medicare Covered Preventative Screenings and [place] Men and Women Who How Often Need? Date of Last Service Action  Abdominal Aortic Aneurysm Adults with AAA risk factors Once      Alcohol Misuse and Counseling All Adults Screening once a year if no alcohol misuse. Counseling up to 4 face to face sessions.     Bone Density Measurement  Adults at risk for osteoporosis Once every 2 yrs      Lipid Panel Z13.6 All adults without CV disease Once every 5 yrs       Colorectal Cancer   Stool sample or  Colonoscopy All adults 90 and older   Once every year  Every 10 years Yes       Depression All Adults Once a year  Today   Diabetes Screening Blood glucose, post glucose load, or GTT Z13.1  All adults at risk  Pre-diabetics  Once per year  Twice per year      Diabetes  Self-Management Training All adults Diabetics 10 hrs first year; 2 hours subsequent years. Requires Copay     Glaucoma  Diabetics  Family history of glaucoma  African Americans 52 yrs +  Hispanic Americans 8 yrs + Annually - requires coppay      Hepatitis C Z72.89 or F19.20  High Risk for HCV  Born between 1945 and 1965  Annually  Once      HIV Z11.4 All adults based on risk  Annually btw ages 14 & 100 regardless of risk  Annually > 65 yrs if at increased risk      Lung Cancer Screening  Asymptomatic adults aged 77-77 with 30 pack yr history and current smoker OR quit within the last 15 yrs Annually Must have counseling and shared decision making documentation before first screen      Medical Nutrition Therapy Adults with   Diabetes  Renal disease  Kidney transplant within past 3 yrs 3 hours first year; 2 hours subsequent years     Obesity and Counseling All adults Screening once a year Counseling if BMI 30 or higher  Today   Tobacco Use Counseling Adults who use tobacco  Up to 8 visits in one year     Vaccines Z23  Hepatitis B  Influenza   Pneumonia  Adults   Once  Once every flu season  Two different vaccines separated by one year     Next Annual Wellness Visit People with Medicare Every year  Today      Services & Screenings Women Who How Often Need  Date of Last Service Action  Mammogram  Z12.31 Women over 67 One baseline ages 8-39. Annually ager 40 yrs+      Pap tests All women Annually if high risk. Every 2 yrs for normal risk women  Screening for cervical cancer with   Pap (Z01.419 nl or Z01.411abnl) &  HPV Z11.51 Women aged 22 to 76 Once every 5 yrs     Screening pelvic and breast exams All women Annually if high risk. Every 2 yrs for normal risk women     Sexually Transmitted Diseases  Chlamydia  Gonorrhea  Syphilis All at risk adults Annually for non pregnant females at increased risk         [place] Men Who How Ofter Need  Date of Last Service Action  Prostate Cancer - DRE & PSA Men over 50 Annually.  DRE might require a copay.        Sexually Transmitted Diseases  Syphilis All at risk adults Annually for men at increased risk      Health Maintenance List Health Maintenance  Topic Date Due  . TETANUS/TDAP  Never done  . COLONOSCOPY (Pts 45-16yrs Insurance coverage will need to be confirmed)  Never done  . COVID-19 Vaccine (3 - J[place] risk 3-dose series) 03/13/2020  . INFLUENZA VACCINE  09/07/2020   . Hepatitis C Screening  Completed  . HIV Screening  Completed  . HPV VACCINES  Aged Out

## 2020-07-07 ENCOUNTER — Ambulatory Visit (INDEPENDENT_AMBULATORY_CARE_PROVIDER_SITE_OTHER): Payer: Medicare Other | Admitting: Podiatry

## 2020-07-07 ENCOUNTER — Encounter: Payer: Self-pay | Admitting: Podiatry

## 2020-07-07 ENCOUNTER — Other Ambulatory Visit: Payer: Self-pay

## 2020-07-07 DIAGNOSIS — B351 Tinea unguium: Secondary | ICD-10-CM

## 2020-07-07 DIAGNOSIS — M79674 Pain in right toe(s): Secondary | ICD-10-CM | POA: Diagnosis not present

## 2020-07-07 DIAGNOSIS — M79675 Pain in left toe(s): Secondary | ICD-10-CM

## 2020-07-07 DIAGNOSIS — Z20822 Contact with and (suspected) exposure to covid-19: Secondary | ICD-10-CM | POA: Diagnosis not present

## 2020-07-07 NOTE — Progress Notes (Signed)
  Subjective:  Patient ID: William [name], male    DOB: 03-Feb-1961,  MRN: [id number]  William [name] presents to clinic today for painful thick toenails that are difficult to trim. Pain interferes with ambulation. Aggravating factors include wearing enclosed shoe gear. Pain is relieved with periodic professional debridement.  His PCP is Jose Persia, MD and last visit was last month.   Review of Systems: Negative except as noted in the HPI. Objective:   Constitutional William [name] is a pleasant 60 y.o. African American male, WD, WN in NAD. AAO x 3.   Vascular Capillary fill time to digits <3 seconds b/l lower extremities. Pedal hair absent. Lower extremity skin temperature gradient within normal limits. No pain with calf compression b/l. No edema noted b/l lower extremities. No cyanosis or clubbing noted.  Neurologic Normal speech. Oriented to person, place, and time. Protective sensation intact 5/5 intact bilaterally with 10g monofilament b/l.  Dermatologic Pedal skin with normal turgor, texture and tone bilaterally. No open wounds bilaterally. No interdigital macerations bilaterally. Toenails 1-5 b/l elongated, discolored, dystrophic, thickened, crumbly with subungual debris and tenderness to dorsal palpation.  Orthopedic: Normal muscle strength 5/5 to all lower extremity muscle groups bilaterally. No pain crepitus or joint limitation noted with ROM b/l. No gross bony deformities bilaterally.   Radiographs: None Assessment:   1. Pain due to onychomycosis of toenails of both feet    Plan:  Patient was evaluated and treated and all questions answered.  Onychomycosis with pain -Nails palliatively debridement as below -Educated on self-care  Procedure: Nail Debridement Rationale: Pain Type of Debridement: manual, sharp debridement. Instrumentation: Nail nipper, rotary burr. Number of Nails: 10 -Examined patient. -Patient to continue soft, supportive shoe gear  daily. -Toenails 1-5 b/l were debrided in length and girth with sterile nail nippers and dremel without iatrogenic bleeding.  -Patient to report any pedal injuries to medical professional immediately. -Patient/POA to call should there be question/concern in the interim.  Return in about 3 months (around 10/07/2020).  Marzetta Board, DPM

## 2020-07-08 ENCOUNTER — Other Ambulatory Visit (HOSPITAL_COMMUNITY): Payer: Self-pay

## 2020-07-16 DIAGNOSIS — Z20822 Contact with and (suspected) exposure to covid-19: Secondary | ICD-10-CM | POA: Diagnosis not present

## 2020-08-05 ENCOUNTER — Other Ambulatory Visit: Payer: Self-pay | Admitting: Family

## 2020-08-05 DIAGNOSIS — B20 Human immunodeficiency virus [HIV] disease: Secondary | ICD-10-CM

## 2020-08-06 ENCOUNTER — Other Ambulatory Visit (HOSPITAL_COMMUNITY): Payer: Self-pay

## 2020-08-06 DIAGNOSIS — R1319 Other dysphagia: Secondary | ICD-10-CM | POA: Diagnosis not present

## 2020-08-06 DIAGNOSIS — Z1211 Encounter for screening for malignant neoplasm of colon: Secondary | ICD-10-CM | POA: Diagnosis not present

## 2020-08-06 DIAGNOSIS — K219 Gastro-esophageal reflux disease without esophagitis: Secondary | ICD-10-CM | POA: Diagnosis not present

## 2020-08-06 DIAGNOSIS — Z8719 Personal history of other diseases of the digestive system: Secondary | ICD-10-CM | POA: Diagnosis not present

## 2020-08-06 DIAGNOSIS — R195 Other fecal abnormalities: Secondary | ICD-10-CM | POA: Diagnosis not present

## 2020-08-06 MED ORDER — PANTOPRAZOLE SODIUM 40 MG PO TBEC
DELAYED_RELEASE_TABLET | ORAL | 1 refills | Status: DC
  Filled 2020-08-06: qty 90, 90d supply, fill #0
  Filled 2021-05-06: qty 90, 90d supply, fill #1

## 2020-08-11 ENCOUNTER — Encounter: Payer: Self-pay | Admitting: *Deleted

## 2020-08-13 ENCOUNTER — Other Ambulatory Visit (HOSPITAL_COMMUNITY): Payer: Self-pay

## 2020-08-13 MED FILL — Bictegravir-Emtricitabine-Tenofovir AF Tab 50-200-25 MG: ORAL | 30 days supply | Qty: 30 | Fill #1 | Status: AC

## 2020-08-17 ENCOUNTER — Encounter: Payer: Medicare Other | Admitting: Internal Medicine

## 2020-08-17 ENCOUNTER — Ambulatory Visit (INDEPENDENT_AMBULATORY_CARE_PROVIDER_SITE_OTHER): Payer: Medicare Other | Admitting: Pharmacist

## 2020-08-17 ENCOUNTER — Other Ambulatory Visit (HOSPITAL_COMMUNITY): Payer: Self-pay

## 2020-08-17 ENCOUNTER — Encounter: Payer: Self-pay | Admitting: Pharmacist

## 2020-08-17 DIAGNOSIS — Z0000 Encounter for general adult medical examination without abnormal findings: Secondary | ICD-10-CM

## 2020-08-17 NOTE — Patient Instructions (Addendum)
(Resident)             Things That May Be Affecting Your Health:   Alcohol   Hearing loss   Pain    Depression + Home Safety   Sexual Health    Diabetes   Lack of physical activity   Stress   Difficulty with daily activities   Loneliness   Tiredness    Drug use + Medicines   Tobacco use    Falls   Motor Vehicle Safety   Weight    Food choices   Oral Health   Other      YOUR PERSONALIZED HEALTH PLAN : 1. Schedule your next subsequent Medicare Wellness visit in one year 2. Attend all of your regular appointments to address your medical issues 3. Complete the preventative screenings and services 4. Consider asking for referall to GI for colonscopy 5. Consider Shingrix and TDAP vaccines.     Annual Wellness Visit                       Medicare Covered Preventative Screenings and Services   Services & Screenings Men and Women Who How Often Need? Date of Last Service Action  Abdominal Aortic Aneurysm Adults with AAA risk factors Once        Alcohol Misuse and Counseling All Adults Screening once a year if no alcohol misuse. Counseling up to 4 face to face sessions.        Bone Density Measurement Adults at risk for osteoporosis Once every 2 yrs        Lipid Panel Z13.6 All adults without CV disease Once every 5 yrs            Colorectal Cancer Stool sample or Colonoscopy All adults 48 and older   Once every year Every 10 years Yes            Depression All Adults Once a year   Today    Diabetes Screening Blood glucose, post glucose load, or GTT Z13.1 All adults at risk Pre-diabetics Once per year Twice per year          Diabetes  Self-Management Training All adults Diabetics 10 hrs first year; 2 hours subsequent years. Requires Copay        Glaucoma Diabetics Family history of glaucoma African Americans 84 yrs + Hispanic Americans 79 yrs + Annually - requires coppay          Hepatitis C Z72.89 or F19.20 High Risk for HCV Born between 1945 and 1965 Annually Once           HIV Z11.4 All adults based on risk Annually btw ages 30 & 65 regardless of risk Annually > 65 yrs if at increased risk          Lung Cancer Screening Asymptomatic adults aged 22-77 with 30 pack yr history and current smoker OR quit within the last 15 yrs Annually Must have counseling and shared decision making documentation before first screen          Medical Nutrition Therapy Adults with Diabetes Renal disease Kidney transplant within past 3 yrs 3 hours first year; 2 hours subsequent years        Obesity and Counseling All adults Screening once a year Counseling if BMI 30 or higher   Today    Tobacco Use Counseling Adults who use tobacco Up to 8 visits in one year        Vaccines Z23 Hepatitis B Influenza  Pneumonia Adults   Once Once every flu season Two different vaccines separated by one year        Next Annual Wellness Visit People with Medicare Every year   Today          Services & Screenings Women Who How Often Need Date of Last Service Action  Mammogram  Z12.31 Women over 29 One baseline ages 48-39. Annually ager 40 yrs+          Pap tests All women Annually if high risk. Every 2 yrs for normal risk women          Screening for cervical cancer with Pap (Z01.419 nl or Z01.411abnl) & HPV Z11.51 Women aged 12 to 45 Once every 5 yrs        Screening pelvic and breast exams All women Annually if high risk. Every 2 yrs for normal risk women        Sexually Transmitted Diseases Chlamydia Gonorrhea Syphilis All at risk adults Annually for non pregnant females at increased risk                [place] Men Who How Ofter Need Date of Last Service Action  Prostate Cancer - DRE & PSA Men over 50 Annually. DRE might require a copay.              Sexually Transmitted Diseases Syphilis All at risk adults Annually for men at increased risk              Colonoscopy, Adult A colonoscopy is a procedure to look at the entire large intestine. This  procedure is done using a long, thin, flexible tube that has a camera on theend. You may have a colonoscopy: As a part of normal colorectal screening. If you have certain symptoms, such as: A low number of red blood cells in your blood (anemia). Diarrhea that does not go away. Pain in your abdomen. Blood in your stool. A colonoscopy can help screen for and diagnose medical problems, including: Tumors. Extra tissue that grows where mucus forms (polyps). Inflammation. Areas of bleeding. Tell your health care provider about: Any allergies you have. All medicines you are taking, including vitamins, herbs, eye drops, creams, and over-the-counter medicines. Any problems you or family members have had with anesthetic medicines. Any blood disorders you have. Any surgeries you have had. Any medical conditions you have. Any problems you have had with having bowel movements. Whether you are pregnant or may be pregnant. What are the risks? Generally, this is a safe procedure. However, problems may occur, including: Bleeding. Damage to your intestine. Allergic reactions to medicines given during the procedure. Infection. This is rare. What happens before the procedure? Eating and drinking restrictions Follow instructions from your health care provider about eating or drinking restrictions, which may include: A few days before the procedure: Follow a low-fiber diet. Avoid nuts, seeds, dried fruit, raw fruits, and vegetables. 1-3 days before the procedure: Eat only gelatin dessert or ice pops. Drink only clear liquids, such as water, clear juice, clear broth or bouillon, black coffee or tea, or clear soft drinks or sports drinks. Avoid liquids that contain red or purple dye. The day of the procedure: Do not eat solid foods. You may continue to drink clear liquids until up to 2 hours before the procedure. Do not eat or drink anything starting 2 hours before the procedure, or within the time  period that your health care provider recommends. Bowel prep If you were  prescribed a bowel prep to take by mouth (orally) to clean out your colon: Take it as told by your health care provider. Starting the day before your procedure, you will need to drink a large amount of liquid medicine. The liquid will cause you to have many bowel movements of loose stool until your stool becomes almost clear or light green. If your skin or the opening between the buttocks (anus) gets irritated from diarrhea, you may relieve the irritation using: Wipes with medicine in them, such as adult wet wipes with aloe and vitamin E. A product to soothe skin, such as petroleum jelly. If you vomit while drinking the bowel prep: Take a break for up to 60 minutes. Begin the bowel prep again. Call your health care provider if you keep vomiting or you cannot take the bowel prep without vomiting. To clean out your colon, you may also be given: Laxative medicines. These help you have a bowel movement. Instructions for enema use. An enema is liquid medicine injected into your rectum. Medicines Ask your health care provider about: Changing or stopping your regular medicines or supplements. This is especially important if you are taking iron supplements, diabetes medicines, or blood thinners. Taking medicines such as aspirin and ibuprofen. These medicines can thin your blood. Do not take these medicines unless your health care provider tells you to take them. Taking over-the-counter medicines, vitamins, herbs, and supplements. General instructions Ask your health care provider what steps will be taken to help prevent infection. These may include washing skin with a germ-killing soap. Plan to have someone take you home from the hospital or clinic. What happens during the procedure?  An IV will be inserted into one of your veins. You may be given one or more of the following: A medicine to help you relax (sedative). A  medicine to numb the area (local anesthetic). A medicine to make you fall asleep (general anesthetic). This is rarely needed. You will lie on your side with your knees bent. The tube will: Have oil or gel put on it (be lubricated). Be inserted into your anus. Be gently eased through all parts of your large intestine. Air will be sent into your colon to keep it open. This may cause some pressure or cramping. Images will be taken with the camera and will appear on a screen. A small tissue sample may be removed to be looked at under a microscope (biopsy). The tissue may be sent to a lab for testing if any signs of problems are found. If small polyps are found, they may be removed and checked for cancer cells. When the procedure is finished, the tube will be removed. The procedure may vary among health care providers and hospitals. What happens after the procedure? Your blood pressure, heart rate, breathing rate, and blood oxygen level will be monitored until you leave the hospital or clinic. You may have a small amount of blood in your stool. You may pass gas and have mild cramping or bloating in your abdomen. This is caused by the air that was used to open your colon during the exam. Do not drive for 24 hours after the procedure. It is up to you to get the results of your procedure. Ask your health care provider, or the department that is doing the procedure, when your results will be ready. Summary A colonoscopy is a procedure to look at the entire large intestine. Follow instructions from your health care provider about eating and drinking before the  procedure. If you were prescribed an oral bowel prep to clean out your colon, take it as told by your health care provider. During the colonoscopy, a flexible tube with a camera on its end is inserted into the anus and then passed into the other parts of the large intestine. This information is not intended to replace advice given to you by your  health care provider. Make sure you discuss any questions you have with your healthcare provider. Document Revised: 08/17/2018 Document Reviewed: 08/17/2018 Elsevier Patient Education  [place] Prevention in the Home, Adult Falls can cause injuries and can happen to people of all ages. There are many things you can do to make your home safe and to help prevent falls. Ask forhelp when making these changes. What actions can I take to prevent falls? General Instructions Use good lighting in all rooms. Replace any light bulbs that burn out. Turn on the lights in dark areas. Use night-lights. Keep items that you use often in easy-to-reach places. Lower the shelves around your home if needed. Set up your furniture so you have a clear path. Avoid moving your furniture around. Do not have throw rugs or other things on the floor that can make you trip. Avoid walking on wet floors. If any of your floors are uneven, fix them. Add color or contrast paint or tape to clearly mark and help you see: Grab bars or handrails. First and last steps of staircases. Where the edge of each step is. If you use a stepladder: Make sure that it is fully opened. Do not climb a closed stepladder. Make sure the sides of the stepladder are locked in place. Ask someone to hold the stepladder while you use it. Know where your pets are when moving through your home. What can I do in the bathroom?     Keep the floor dry. Clean up any water on the floor right away. Remove soap buildup in the tub or shower. Use nonskid mats or decals on the floor of the tub or shower. Attach bath mats securely with double-sided, nonslip rug tape. If you need to sit down in the shower, use a plastic, nonslip stool. Install grab bars by the toilet and in the tub and shower. Do not use towel bars as grab bars. What can I do in the bedroom? Make sure that you have a light by your bed that is easy to reach. Do not use any  sheets or blankets for your bed that hang to the floor. Have a firm chair with side arms that you can use for support when you get dressed. What can I do in the kitchen? Clean up any spills right away. If you need to reach something above you, use a step stool with a grab bar. Keep electrical cords out of the way. Do not use floor polish or wax that makes floors slippery. What can I do with my stairs? Do not leave any items on the stairs. Make sure that you have a light switch at the top and the bottom of the stairs. Make sure that there are handrails on both sides of the stairs. Fix handrails that are broken or loose. Install nonslip stair treads on all your stairs. Avoid having throw rugs at the top or bottom of the stairs. Choose a carpet that does not hide the edge of the steps on the stairs. Check carpeting to make sure that it is firmly attached to the stairs. Fix carpet  that is loose or worn. What can I do on the outside of my home? Use bright outdoor lighting. Fix the edges of walkways and driveways and fix any cracks. Remove anything that might make you trip as you walk through a door, such as a raised step or threshold. Trim any bushes or trees on paths to your home. Check to see if handrails are loose or broken and that both sides of all steps have handrails. Install guardrails along the edges of any raised decks and porches. Clear paths of anything that can make you trip, such as tools or rocks. Have leaves, snow, or ice cleared regularly. Use sand or salt on paths during winter. Clean up any spills in your garage right away. This includes grease or oil spills. What other actions can I take? Wear shoes that: Have a low heel. Do not wear high heels. Have rubber bottoms. Feel good on your feet and fit well. Are closed at the toe. Do not wear open-toe sandals. Use tools that help you move around if needed. These include: Canes. Walkers. Scooters. Crutches. Review your  medicines with your doctor. Some medicines can make you feel dizzy. This can increase your chance of falling. Ask your doctor what else you can do to help prevent falls. Where to find more information Centers for Disease Control and Prevention, [employer]: http://www.wolf.info/ National Institute on Aging: http://kim-miller.com/ Contact a doctor if: You are afraid of falling at home. You feel weak, drowsy, or dizzy at home. You fall at home. Summary There are many simple things that you can do to make your home safe and to help prevent falls. Ways to make your home safe include removing things that can make you trip and installing grab bars in the bathroom. Ask for help when making these changes in your home. This information is not intended to replace advice given to you by your health care provider. Make sure you discuss any questions you have with your healthcare provider. Document Revised: 08/28/2019 Document Reviewed: 08/28/2019 Elsevier Patient Education  [place] Maintenance, Male Adopting a healthy lifestyle and getting preventive care are important in promoting health and wellness. Ask your health care provider about: The right schedule for you to have regular tests and exams. Things you can do on your own to prevent diseases and keep yourself healthy. What should I know about diet, weight, and exercise? Eat a healthy diet  Eat a diet that includes plenty of vegetables, fruits, low-fat dairy products, and lean protein. Do not eat a lot of foods that are high in solid fats, added sugars, or sodium.  Maintain a healthy weight Body mass index (BMI) is a measurement that can be used to identify possible weight problems. It estimates body fat based on height and weight. Your health care provider can help determine your BMI and help you achieve or maintain ahealthy weight. Get regular exercise Get regular exercise. This is one of the most important things you can do for your health.  Most adults should: Exercise for at least 150 minutes each week. The exercise should increase your heart rate and make you sweat (moderate-intensity exercise). Do strengthening exercises at least twice a week. This is in addition to the moderate-intensity exercise. Spend less time sitting. Even light physical activity can be beneficial. Watch cholesterol and blood lipids Have your blood tested for lipids and cholesterol at 58 years of age, then havethis test every 5 years. You may need to have your cholesterol levels  checked more often if: Your lipid or cholesterol levels are high. You are older than 58 years of age. You are at high risk for heart disease. What should I know about cancer screening? Many types of cancers can be detected early and may often be prevented. Depending on your health history and family history, you may need to have cancer screening at various ages. This may include screening for: Colorectal cancer. Prostate cancer. Skin cancer. Lung cancer. What should I know about heart disease, diabetes, and high blood pressure? Blood pressure and heart disease High blood pressure causes heart disease and increases the risk of stroke. This is more likely to develop in people who have high blood pressure readings, are of African descent, or are overweight. Talk with your health care provider about your target blood pressure readings. Have your blood pressure checked: Every 3-5 years if you are 74-65 years of age. Every year if you are 69 years old or older. If you are between the ages of 11 and 50 and are a current or former smoker, ask your health care provider if you should have a one-time screening for abdominal aortic aneurysm (AAA). Diabetes Have regular diabetes screenings. This checks your fasting blood sugar level. Have the screening done: Once every three years after age 55 if you are at a normal weight and have a low risk for diabetes. More often and at a younger age  if you are overweight or have a high risk for diabetes. What should I know about preventing infection? Hepatitis B If you have a higher risk for hepatitis B, you should be screened for this virus. Talk with your health care provider to find out if you are at risk forhepatitis B infection. Hepatitis C Blood testing is recommended for: Everyone born from [birth year] through 1963. Anyone with known risk factors for hepatitis C. Sexually transmitted infections (STIs) You should be screened each year for STIs, including gonorrhea and chlamydia, if: You are sexually active and are younger than 58 years of age. You are older than 58 years of age and your health care provider tells you that you are at risk for this type of infection. Your sexual activity has changed since you were last screened, and you are at increased risk for chlamydia or gonorrhea. Ask your health care provider if you are at risk. Ask your health care provider about whether you are at high risk for HIV. Your health care provider may recommend a prescription medicine to help prevent HIV infection. If you choose to take medicine to prevent HIV, you should first get tested for HIV. You should then be tested every 3 months for as long as you are taking the medicine. Follow these instructions at home: Lifestyle Do not use any products that contain nicotine or tobacco, such as cigarettes, e-cigarettes, and chewing tobacco. If you need help quitting, ask your health care provider. Do not use street drugs. Do not share needles. Ask your health care provider for help if you need support or information about quitting drugs. Alcohol use Do not drink alcohol if your health care provider tells you not to drink. If you drink alcohol: Limit how much you have to 0-2 drinks a day. Be aware of how much alcohol is in your drink. In the [place] one drink equals one 12 oz bottle of beer (355 mL), one 5 oz glass of wine (148 mL), or one 1 oz glass of hard  liquor (44 mL). General instructions Schedule regular health, dental,  and eye exams. Stay current with your vaccines. Tell your health care provider if: You often feel depressed. You have ever been abused or do not feel safe at home. Summary Adopting a healthy lifestyle and getting preventive care are important in promoting health and wellness. Follow your health care provider's instructions about healthy diet, exercising, and getting tested or screened for diseases. Follow your health care provider's instructions on monitoring your cholesterol and blood pressure. This information is not intended to replace advice given to you by your health care provider. Make sure you discuss any questions you have with your healthcare provider. Document Revised: 01/17/2018 Document Reviewed: 01/17/2018 Elsevier Patient Education  2022 [employer].

## 2020-08-17 NOTE — Progress Notes (Signed)
This AWV is being conducted in-person at [facility]  Subjective:   William [name] is a 60 y.o. male who presents for a Medicare Annual Wellness Visit.  The following items have been reviewed and updated today in the appropriate area in the EMR.   Health Risk Assessment  Height, weight, BMI, and BP Visual acuity if needed Depression screen Fall risk / safety level Advance directive discussion Medical and family history were reviewed and updated Updating list of other providers & suppliers Medication reconciliation, including over the counter medicines Cognitive screen Written screening schedule Risk Factor list Personalized health advice, risky behaviors, and treatment advice  Social History   Social History Narrative   Current Social History 08/17/2020        Patient lives with three roommates in an home which is 1 story There are 4 steps up to the entrance the patient uses. There is a handrail on each side      Patient's method of transportation is city bus.      The highest level of education was high school diploma.      The patient currently disabled.      Identified important Relationships are: mother       Pets : none       Interests / Fun: "go to the park and play basketball. Socialize"       Current Stressors: "I don't really have too much stress in my life"       Religious / Personal Beliefs: Baptist       Other: "I am just an outgoing person."        Cardiac Risk Factors include: male gender;hypertension;smoking/ tobacco exposure;advanced age (>59men, >17 women)    Objective:    Vitals: There were no vitals taken for this visit. Vitals are  unable to be obtained  Activities of Daily Living In your present state of health, do you have any difficulty performing the following activities: 08/17/2020 05/25/2020  Hearing? N N  Vision? N N  Difficulty concentrating or making decisions? N N  Walking or climbing stairs? N N  Dressing or bathing? N N  Doing  errands, shopping? N N  Preparing Food and eating ? N -  Using the Toilet? N -  In the past six months, have you accidently leaked urine? N -  Do you have problems with loss of bowel control? N -  Managing your Medications? N -  Managing your Finances? N -  Housekeeping or managing your Housekeeping? N -  Some recent data might be hidden    Goals  Goals      Exercise daily (30 min per time)     "I try to walk every single day and play basketball 2x/week."         Fall Risk Fall Risk  08/17/2020 05/25/2020 09/24/2019 07/25/2019 03/27/2019  Falls in the past year? 0 0 0 0 0  Number falls in past yr: - - - - -  Injury with Fall? - - - - -  Risk for fall due to : No Fall Risks No Fall Risks - - -  Follow up - - - - Falls prevention discussed    Depression Screen PHQ 2/9 Scores 08/17/2020 05/25/2020 09/24/2019 07/25/2019  PHQ - 2 Score 0 0 0 0  PHQ- 9 Score - - - 0     Cognitive Testing Six-Item Cognitive Screener   "I would like to ask you some questions that ask you to use your  memory. I am going to name three objects. Please wait until I say all three words, then repeat them. Remember what they are  because I am going to ask you to name them again in a few minutes. Please repeat these words for me: APPLE--TABLE--PENNY." (Interviewer may repeat names 3 times if necessary but repetition not scored.)  Did patient correctly repeat all three words? Yes - may proceed with screen  What year is this? Correct What month is this? Correct What day of the week is this? Correct  What were the three objects I asked you to remember? Apple Correct Table Correct Penny Correct  Score one point for each incorrect answer.  A score of 2 or more points warrants additional investigation.  Patient's score 0   Assessment and Plan:    During the course of the visit the patient was educated and counseled about appropriate screening and preventive services as documented in the assessment and  plan. Provided patient VIS for Shingrix and Tdap vaccinations for patient to consider. Also provided patient materials for sitting and standing balance exercises in addition to a resistance band. Recommend patient receive colonoscopy.  The printed AVS was given to the patient and included an updated screening schedule, a list of risk factors, and personalized health advice.        Hughes Better, [facility]  08/17/2020

## 2020-08-19 NOTE — Progress Notes (Signed)
I discussed the AWV findings with the RN who conducted the visit. I was present in the office suite and immediately available to provide assistance and direction throughout the time the service was provided.   

## 2020-08-27 ENCOUNTER — Encounter: Payer: Medicare Other | Admitting: Internal Medicine

## 2020-09-02 ENCOUNTER — Other Ambulatory Visit (HOSPITAL_COMMUNITY): Payer: Self-pay

## 2020-09-08 ENCOUNTER — Other Ambulatory Visit (HOSPITAL_COMMUNITY): Payer: Self-pay

## 2020-09-10 ENCOUNTER — Other Ambulatory Visit: Payer: Medicare Other

## 2020-09-10 ENCOUNTER — Encounter (INDEPENDENT_AMBULATORY_CARE_PROVIDER_SITE_OTHER): Payer: Self-pay | Admitting: *Deleted

## 2020-09-10 ENCOUNTER — Other Ambulatory Visit: Payer: Self-pay

## 2020-09-10 VITALS — BP 128/80 | HR 65 | Temp 97.5°F | Wt 187.0 lb

## 2020-09-10 DIAGNOSIS — B20 Human immunodeficiency virus [HIV] disease: Secondary | ICD-10-CM | POA: Diagnosis not present

## 2020-09-10 DIAGNOSIS — Z006 Encounter for examination for normal comparison and control in clinical research program: Secondary | ICD-10-CM

## 2020-09-10 NOTE — Research (Signed)
William [name] here today for his Month 20 visit for Reprieve, A Randomized Trial to Prevent Vascular Events in HIV. No new complaints or medications. Verbalized excellent adherence with his Biktarvy and study medication. Denied any muscle aches or weakness. He is scheduled to see Dr. Megan Salon in 2 weeks. He will return in December for his next study visit.

## 2020-09-11 ENCOUNTER — Other Ambulatory Visit (HOSPITAL_COMMUNITY): Payer: Self-pay

## 2020-09-11 LAB — T-HELPER CELL (CD4) - (RCID CLINIC ONLY)
CD4 % Helper T Cell: 20 % — ABNORMAL LOW (ref 33–65)
CD4 T Cell Abs: 218 /uL — ABNORMAL LOW (ref 400–1790)

## 2020-09-12 LAB — HIV-1 RNA QUANT-NO REFLEX-BLD
HIV 1 RNA Quant: <20 Copies/mL — ABNORMAL HIGH
HIV-1 RNA Quant, Log: <1.3 Log cps/mL — ABNORMAL HIGH

## 2020-09-22 ENCOUNTER — Other Ambulatory Visit (HOSPITAL_COMMUNITY): Payer: Self-pay

## 2020-09-24 ENCOUNTER — Encounter: Payer: Self-pay | Admitting: Internal Medicine

## 2020-09-24 ENCOUNTER — Other Ambulatory Visit (HOSPITAL_COMMUNITY): Payer: Self-pay

## 2020-09-24 ENCOUNTER — Other Ambulatory Visit: Payer: Self-pay

## 2020-09-24 ENCOUNTER — Ambulatory Visit (INDEPENDENT_AMBULATORY_CARE_PROVIDER_SITE_OTHER): Payer: Medicare Other | Admitting: Internal Medicine

## 2020-09-24 DIAGNOSIS — F17210 Nicotine dependence, cigarettes, uncomplicated: Secondary | ICD-10-CM | POA: Diagnosis not present

## 2020-09-24 DIAGNOSIS — B20 Human immunodeficiency virus [HIV] disease: Secondary | ICD-10-CM

## 2020-09-24 DIAGNOSIS — F1020 Alcohol dependence, uncomplicated: Secondary | ICD-10-CM | POA: Diagnosis not present

## 2020-09-24 MED ORDER — BIKTARVY 50-200-25 MG PO TABS
1.0000 | ORAL_TABLET | Freq: Every day | ORAL | 11 refills | Status: DC
  Filled 2020-09-24 – 2020-10-07 (×2): qty 30, 30d supply, fill #0
  Filled 2020-12-04: qty 30, 30d supply, fill #1
  Filled 2021-01-13: qty 30, 30d supply, fill #2
  Filled 2021-02-18: qty 30, 30d supply, fill #3

## 2020-09-24 NOTE — Assessment & Plan Note (Signed)
Congratulated him on his current sobriety.

## 2020-09-24 NOTE — Assessment & Plan Note (Signed)
His infection remains under very good, long-term control.  His CD4 count is below normal but not in a range where he is at high risk for opportunistic infection.  He will continue Biktarvy and follow-up after lab work in 6 months.

## 2020-09-24 NOTE — Assessment & Plan Note (Signed)
I encouraged him to do his best to quit completely.

## 2020-09-24 NOTE — Progress Notes (Signed)
Patient Active Problem List   Diagnosis Date Noted   Human immunodeficiency virus (HIV) disease ([place]) 07/10/2006    Priority: High   Chronic hepatitis C without hepatic coma ([place]) 07/10/2006    Priority: Medium   Screening for colon cancer 05/25/2020   Hypertension 03/27/2019   Grief 03/27/2019   GERD with esophagitis 09/01/2016   Cigarette smoker 12/23/2014   Spontaneous pneumothorax 04/24/2013   Seasonal allergies 01/22/2013   PNEUMOCYSTIS PNEUMONIA 07/10/2006   Alcohol use disorder, moderate, dependence ([place]) 07/10/2006    Patient's Medications  New Prescriptions   No medications on file  Previous Medications   BICTEGRAVIR-EMTRICITABINE-TENOFOVIR AF (BIKTARVY) 50-200-25 MG TABS TABLET    TAKE 1 TABLET BY MOUTH DAILY.   BICTEGRAVIR-EMTRICITABINE-TENOFOVIR AF (BIKTARVY) 50-200-25 MG TABS TABLET    TAKE 1 TABLET BY MOUTH DAILY   CLOTRIMAZOLE-BETAMETHASONE (LOTRISONE) CREAM    Apply to both feet and between toes bid x 4 weeks.   HYDROCHLOROTHIAZIDE (MICROZIDE) 12.5 MG CAPSULE    Take 1 capsule (12.5 mg total) by mouth daily.   LORATADINE (CLARITIN) 10 MG TABLET    Take 1 tablet (10 mg total) by mouth daily.   NALTREXONE (DEPADE) 50 MG TABLET    Take one-half tablet  (25 mg total) by mouth daily.   PANTOPRAZOLE (PROTONIX) 40 MG TABLET    Take 1 tablet by mouth every morning   PITAVASTATIN CALCIUM 4 MG TABS    Take 4 mg by mouth daily. This may be placebo and is study provided. Do not fill prescription.   TRIAMCINOLONE CREAM (KENALOG) 0.1 %    apply to affected area twice a day   VRAYLAR CAPSULE      Modified Medications   Modified Medication Previous Medication   BICTEGRAVIR-EMTRICITABINE-TENOFOVIR AF (BIKTARVY) 50-200-25 MG TABS TABLET bictegravir-emtricitabine-tenofovir AF (BIKTARVY) 50-200-25 MG TABS tablet      Take 1 tablet by mouth daily.    Take 1 tablet by mouth daily.  Discontinued Medications   No medications on file    Subjective: Joanna is in for his  routine HIV follow-up visit.  He denies any problems obtaining, taking or tolerating his Biktarvy and has not missed any doses recently.  He is down to smoking 2 cigarettes daily but has no current plan to quit completely.  He says he has not been drinking alcohol since his last visit.  He denies feeling anxious or depressed.  Has been having trouble with transportation and wants to know how he can sign up for [employer]. He has not been sexually active.  Review of Systems: Review of Systems  Constitutional:  Negative for fever and weight loss.  Respiratory:  Negative for cough.   Cardiovascular:  Negative for chest pain.  Skin:  Negative for rash.  Psychiatric/Behavioral:  Negative for depression. The patient is not nervous/anxious.    Past Medical History:  Diagnosis Date   Allergy    Folliculitis [id number]   HIV (human immunodeficiency virus infection) ([place])    Hypertension    Pneumothorax     Social History   Tobacco Use   Smoking status: Former    Packs/day: 0.25    Years: 0.00    Pack years: 0.00    Types: Cigarettes    Quit date: 04/09/2018    Years since quitting: 2.4   Smokeless tobacco: Never   Tobacco comments:    Using patches.  Vaping Use   Vaping Use: Never used  Substance Use Topics  Alcohol use: Yes    Alcohol/week: 0.0 standard drinks    Comment: daily   Drug use: Not Currently    Types: Cocaine    Family History  Adopted: Yes  Problem Relation Age of Onset   Heart attack Sister    Hypertension Maternal Aunt    Hypertension Other     Not on File  Health Maintenance  Topic Date Due   TETANUS/TDAP  Never done   Zoster Vaccines- Shingrix (1 of 2) Never done   COLONOSCOPY (Pts 45-80yr Insurance coverage will need to be confirmed)  Never done   COVID-19 Vaccine (3 - Janssen risk series) 03/13/2020   INFLUENZA VACCINE  09/07/2020   Pneumococcal Vaccine 028646Years old (4 - PPSV23 or PCV20) 08/27/2027   Hepatitis C Screening  Completed   HIV Screening   Completed   HPV VACCINES  Aged Out    Objective:  Vitals:   09/24/20 0940  BP: (!) 127/92  Pulse: 72  Temp: 97.6 F (36.4 C)  TempSrc: Oral  Weight: 186 lb (84.4 kg)  Height: '5\' 11"'$  (1.803 m)   Body mass index is 25.94 kg/m.  Physical Exam Constitutional:      Comments: He is in good spirits.  Cardiovascular:     Rate and Rhythm: Normal rate and regular rhythm.     Heart sounds: No murmur heard. Pulmonary:     Effort: Pulmonary effort is normal.     Breath sounds: Normal breath sounds.  Skin:    Findings: No rash.  Psychiatric:        Mood and Affect: Mood normal.    Lab Results Lab Results  Component Value Date   WBC 6.4 03/12/2020   HGB 15.0 03/12/2020   HCT 44.2 03/12/2020   MCV 89.5 03/12/2020   PLT 307 03/12/2020    Lab Results  Component Value Date   CREATININE 0.81 03/12/2020   BUN 13 03/12/2020   NA 137 03/12/2020   K 4.3 03/12/2020   CL 102 03/12/2020   CO2 25 03/12/2020    Lab Results  Component Value Date   ALT 42 03/12/2020   AST 40 (H) 03/12/2020   ALKPHOS 69 11/04/2016   BILITOT 0.6 03/12/2020    Lab Results  Component Value Date   CHOL 154 02/27/2019   HDL 62 02/27/2019   LDLCALC 73 02/27/2019   TRIG 111 02/27/2019   CHOLHDL 2.5 02/27/2019   Lab Results  Component Value Date   LABRPR NON-REACTIVE 03/12/2020   RPRTITER 1:1 (H) 04/04/2017   HIV 1 RNA Quant (Copies/mL)  Date Value  09/10/2020 <20 (H)  03/12/2020 55 (H)  09/10/2019 23 (H)   CD4 T Cell Abs (/uL)  Date Value  09/10/2020 218 (L)  03/12/2020 282 (L)  09/10/2019 207 (L)     Problem List Items Addressed This Visit       High   Human immunodeficiency virus (HIV) disease ([place]    His infection remains under very good, long-term control.  His CD4 count is below normal but not in a range where he is at high risk for opportunistic infection.  He will continue Biktarvy and follow-up after lab work in 6 months.      Relevant Medications    bictegravir-emtricitabine-tenofovir AF (BIKTARVY) 50-200-25 MG TABS tablet   Other Relevant Orders   T-helper cell (CD4)- ([facility] clinic only)   HIV-1 RNA quant-no reflex-bld   Comprehensive metabolic panel   CBC   RPR     Unprioritized  Alcohol use disorder, moderate, dependence ([place])    Congratulated him on his current sobriety.      Cigarette smoker    I encouraged him to do his best to quit completely.         Michel Bickers, MD [facility] for Infect[facility] [phone] pager   [phone] cell 09/24/2020, 9:58 AM

## 2020-10-06 ENCOUNTER — Other Ambulatory Visit: Payer: Self-pay

## 2020-10-06 ENCOUNTER — Encounter: Payer: Self-pay | Admitting: Podiatry

## 2020-10-06 ENCOUNTER — Ambulatory Visit (INDEPENDENT_AMBULATORY_CARE_PROVIDER_SITE_OTHER): Payer: Medicare Other | Admitting: Podiatry

## 2020-10-06 DIAGNOSIS — M79675 Pain in left toe(s): Secondary | ICD-10-CM | POA: Diagnosis not present

## 2020-10-06 DIAGNOSIS — M79674 Pain in right toe(s): Secondary | ICD-10-CM | POA: Diagnosis not present

## 2020-10-06 DIAGNOSIS — B20 Human immunodeficiency virus [HIV] disease: Secondary | ICD-10-CM | POA: Diagnosis not present

## 2020-10-06 DIAGNOSIS — B351 Tinea unguium: Secondary | ICD-10-CM | POA: Diagnosis not present

## 2020-10-07 ENCOUNTER — Other Ambulatory Visit: Payer: Self-pay | Admitting: Internal Medicine

## 2020-10-07 ENCOUNTER — Other Ambulatory Visit (HOSPITAL_COMMUNITY): Payer: Self-pay

## 2020-10-07 DIAGNOSIS — F1020 Alcohol dependence, uncomplicated: Secondary | ICD-10-CM

## 2020-10-09 ENCOUNTER — Other Ambulatory Visit (HOSPITAL_COMMUNITY): Payer: Self-pay

## 2020-10-09 MED ORDER — NALTREXONE HCL 50 MG PO TABS
25.0000 mg | ORAL_TABLET | Freq: Every day | ORAL | 1 refills | Status: DC
  Filled 2020-10-09: qty 30, 60d supply, fill #0
  Filled 2021-02-18: qty 30, 60d supply, fill #1

## 2020-10-12 NOTE — Progress Notes (Signed)
  Subjective:  Patient ID: William [name], male    DOB: March 08, 1960,  MRN: [id number]  60 y.o. male presents with painful thick toenails that are difficult to trim. Pain interferes with ambulation. Aggravating factors include wearing enclosed shoe gear. Pain is relieved with periodic professional debridement.  PCP: Jose Persia, MD and last visit was: a couple of months ago.  He voices no new pedal problems on today's visit.  Review of Systems: Negative except as noted in the HPI.   No Known Allergies  Objective:  There were no vitals filed for this visit. Constitutional Patient is a pleasant 60 y.o. African American male WD, WN in NAD. AAO x 3.  Vascular Capillary fill time <3 seconds b/l.  DP/PT pulse(s) are palpable b/l lower extremities. Pedal hair sparse. Lower extremity skin temperature gradient within normal limits. No pain with calf compression b/l. No edema noted b/l lower extremities. No cyanosis or clubbing noted.   Neurologic Protective sensation intact 5/5 intact bilaterally with 10g monofilament b/l. Vibratory sensation intact b/l. No clonus b/l.  Dermatologic Pedal skin is warm and supple b/l.  No open wounds b/l lower extremities. No interdigital macerations b/l lower extremities. Toenails 1-5 b/l elongated, discolored, dystrophic, thickened, crumbly with subungual debris and tenderness to dorsal palpation.   Orthopedic: Normal muscle strength 5/5 to all lower extremity muscle groups bilaterally. Patient ambulates independent of any assistive aids.    Assessment:   1. Pain due to onychomycosis of toenails of both feet   2. Human immunodeficiency virus (HIV) disease ([place])    Plan:  Patient was evaluated and treated and all questions answered. Consent given for treatment as described below: -No new findings. No new orders. -Patient to continue soft, supportive shoe gear daily. -Toenails 1-5 b/l were debrided in length and girth with sterile nail nippers and dremel  without iatrogenic bleeding.  -Patient to report any pedal injuries to medical professional immediately. -Patient/POA to call should there be question/concern in the interim.  Return in about 3 months (around 01/06/2021).  Marzetta Board, DPM

## 2020-10-14 NOTE — Progress Notes (Signed)
Internal Medicine Clinic Attending  Case discussed with Dr. Charleen Kirks at the time of the visit.  I reviewed the AWV findings.  I agree with the assessment, diagnosis, and plan of care documented in the AWV note.

## 2020-10-16 ENCOUNTER — Ambulatory Visit (INDEPENDENT_AMBULATORY_CARE_PROVIDER_SITE_OTHER): Payer: Medicare Other

## 2020-10-16 ENCOUNTER — Other Ambulatory Visit: Payer: Self-pay

## 2020-10-16 DIAGNOSIS — Z23 Encounter for immunization: Secondary | ICD-10-CM

## 2020-10-16 NOTE — Progress Notes (Signed)
    [facility]  Name:  William [name]    MRN: [id number] DOB: [birth year]   10/16/2020  William [name] was observed post JYNNEOS immunization for 15 minutes without incident. He was provided with Vaccine Information Sheet and instruction to access the V-Safe system.   William [name] was instructed to call 911 with any severe reactions post vaccine: Difficulty breathing  Swelling of face and throat  A fast heartbeat  A bad rash all over body  Dizziness and weakness     Yeraldy Spike T Brooks Sailors

## 2020-11-02 ENCOUNTER — Other Ambulatory Visit (HOSPITAL_COMMUNITY): Payer: Self-pay

## 2020-11-25 ENCOUNTER — Other Ambulatory Visit (HOSPITAL_COMMUNITY): Payer: Self-pay

## 2020-11-25 MED ORDER — PEG 3350-KCL-NA BICARB-NACL 420 G PO SOLR
ORAL | 0 refills | Status: DC
  Filled 2020-11-25: qty 4000, 1d supply, fill #0

## 2020-11-26 ENCOUNTER — Other Ambulatory Visit (HOSPITAL_COMMUNITY): Payer: Self-pay

## 2020-11-27 ENCOUNTER — Ambulatory Visit: Payer: Medicare Other

## 2020-11-30 DIAGNOSIS — K449 Diaphragmatic hernia without obstruction or gangrene: Secondary | ICD-10-CM | POA: Diagnosis not present

## 2020-11-30 DIAGNOSIS — K648 Other hemorrhoids: Secondary | ICD-10-CM | POA: Diagnosis not present

## 2020-11-30 DIAGNOSIS — R1310 Dysphagia, unspecified: Secondary | ICD-10-CM | POA: Diagnosis not present

## 2020-11-30 DIAGNOSIS — K2970 Gastritis, unspecified, without bleeding: Secondary | ICD-10-CM | POA: Diagnosis not present

## 2020-11-30 DIAGNOSIS — Z1211 Encounter for screening for malignant neoplasm of colon: Secondary | ICD-10-CM | POA: Diagnosis not present

## 2020-12-04 ENCOUNTER — Other Ambulatory Visit (HOSPITAL_COMMUNITY): Payer: Self-pay

## 2020-12-14 ENCOUNTER — Telehealth: Payer: Self-pay

## 2020-12-14 NOTE — Telephone Encounter (Signed)
Attempted to contact patient to make aware of Jynneos vaccine available at [facility]. Patient needs 2nd dose. Will also send patient MyChart message. William [name]

## 2020-12-28 ENCOUNTER — Other Ambulatory Visit (HOSPITAL_COMMUNITY): Payer: Self-pay

## 2021-01-12 ENCOUNTER — Other Ambulatory Visit: Payer: Self-pay

## 2021-01-12 ENCOUNTER — Encounter: Payer: Self-pay | Admitting: Podiatry

## 2021-01-12 ENCOUNTER — Ambulatory Visit (INDEPENDENT_AMBULATORY_CARE_PROVIDER_SITE_OTHER): Payer: Medicare Other | Admitting: Podiatry

## 2021-01-12 DIAGNOSIS — B351 Tinea unguium: Secondary | ICD-10-CM

## 2021-01-12 DIAGNOSIS — M79674 Pain in right toe(s): Secondary | ICD-10-CM | POA: Diagnosis not present

## 2021-01-12 DIAGNOSIS — R21 Rash and other nonspecific skin eruption: Secondary | ICD-10-CM | POA: Diagnosis not present

## 2021-01-12 DIAGNOSIS — B20 Human immunodeficiency virus [HIV] disease: Secondary | ICD-10-CM | POA: Diagnosis not present

## 2021-01-12 DIAGNOSIS — M79675 Pain in left toe(s): Secondary | ICD-10-CM

## 2021-01-12 DIAGNOSIS — K2970 Gastritis, unspecified, without bleeding: Secondary | ICD-10-CM | POA: Insufficient documentation

## 2021-01-12 DIAGNOSIS — R1310 Dysphagia, unspecified: Secondary | ICD-10-CM | POA: Insufficient documentation

## 2021-01-13 ENCOUNTER — Other Ambulatory Visit (HOSPITAL_COMMUNITY): Payer: Self-pay

## 2021-01-14 ENCOUNTER — Encounter (INDEPENDENT_AMBULATORY_CARE_PROVIDER_SITE_OTHER): Payer: Medicare Other | Admitting: *Deleted

## 2021-01-14 ENCOUNTER — Other Ambulatory Visit: Payer: Self-pay

## 2021-01-14 DIAGNOSIS — Z006 Encounter for examination for normal comparison and control in clinical research program: Secondary | ICD-10-CM

## 2021-01-14 NOTE — Research (Signed)
William [name] was here for a Reprieve study visit. He denies any new problems or medications. He says he is very adherent with his ARV and the study medication. He is to return in April for the next visit.

## 2021-01-19 NOTE — Progress Notes (Signed)
Subjective: William [name] is a 60 y.o. male patient seen today for follow up of  painful thick toenails that are difficult to trim. Pain interferes with ambulation. Aggravating factors include wearing enclosed shoe gear. Pain is relieved with periodic professional debridement.  He has h/o HIV and is followed by ID.  New problems reported today: He states he wore some boots which irritated his feet a while ago. He developed rash and states it's resolving now.  PCP is Jose Persia, MD. Last visit was: two months ago.Marland Kitchen  No Known Allergies  Objective: Physical Exam  General: Patient is a pleasant 60 y.o. African American male WD, WN in NAD. AAO x 3.   Neurovascular Examination: CFT <3 seconds b/l LE. Palpable DP pulse(s) b/l LE. Palpable PT pulse(s) b/l LE. Pedal hair absent. No pain with calf compression b/l. Evidence of chronic venous insufficiency b/l LE.  Protective sensation intact 5/5 intact bilaterally with 10g monofilament b/l. Vibratory sensation intact b/l.  Dermatological:  Resolving skin eruption noted bunion area of right foot. No interdigital macerations noted b/l LE. Toenails 1-5 b/l elongated, discolored, dystrophic, thickened, crumbly with subungual debris and tenderness to dorsal palpation.  Musculoskeletal:  Normal muscle strength 5/5 to all lower extremity muscle groups bilaterally. HAV with bunion deformity noted b/l LE.Marland Kitchen No pain, crepitus or joint limitation noted with ROM b/l LE.  Patient ambulates independently without assistive aids.  Assessment: 1. Pain due to onychomycosis of toenails of both feet   2. Skin eruption   3. Human immunodeficiency virus (HIV) disease ([place])    Plan: Patient was evaluated and treated and all questions answered. Consent given for treatment as described below: -For resolving skin eruption right foot, continue to keep clean. Monitor for any changes. -Patient to continue soft, supportive shoe gear daily. -Mycotic toenails 1-5  bilaterally were debrided in length and girth with sterile nail nippers and dremel without incident. -Patient/POA to call should there be question/concern in the interim.  Return in about 3 months (around 04/12/2021).  Marzetta Board, DPM

## 2021-02-08 ENCOUNTER — Other Ambulatory Visit (HOSPITAL_COMMUNITY): Payer: Self-pay

## 2021-02-10 DIAGNOSIS — Z1152 Encounter for screening for COVID-19: Secondary | ICD-10-CM | POA: Diagnosis not present

## 2021-02-11 ENCOUNTER — Other Ambulatory Visit (HOSPITAL_COMMUNITY): Payer: Self-pay

## 2021-02-18 ENCOUNTER — Other Ambulatory Visit (HOSPITAL_COMMUNITY): Payer: Self-pay

## 2021-02-19 ENCOUNTER — Other Ambulatory Visit (HOSPITAL_COMMUNITY): Payer: Self-pay

## 2021-03-15 ENCOUNTER — Other Ambulatory Visit (HOSPITAL_COMMUNITY): Payer: Self-pay

## 2021-03-17 ENCOUNTER — Other Ambulatory Visit (HOSPITAL_COMMUNITY): Payer: Self-pay

## 2021-03-19 ENCOUNTER — Other Ambulatory Visit (HOSPITAL_COMMUNITY): Payer: Self-pay

## 2021-03-25 ENCOUNTER — Encounter: Payer: Self-pay | Admitting: *Deleted

## 2021-03-26 ENCOUNTER — Other Ambulatory Visit (HOSPITAL_COMMUNITY): Payer: Self-pay

## 2021-04-01 ENCOUNTER — Ambulatory Visit (INDEPENDENT_AMBULATORY_CARE_PROVIDER_SITE_OTHER): Payer: Medicare Other | Admitting: Internal Medicine

## 2021-04-01 ENCOUNTER — Other Ambulatory Visit: Payer: Self-pay

## 2021-04-01 ENCOUNTER — Other Ambulatory Visit (HOSPITAL_COMMUNITY): Payer: Self-pay

## 2021-04-01 ENCOUNTER — Ambulatory Visit (INDEPENDENT_AMBULATORY_CARE_PROVIDER_SITE_OTHER): Payer: Medicare Other

## 2021-04-01 DIAGNOSIS — B20 Human immunodeficiency virus [HIV] disease: Secondary | ICD-10-CM | POA: Diagnosis not present

## 2021-04-01 DIAGNOSIS — Z23 Encounter for immunization: Secondary | ICD-10-CM | POA: Diagnosis not present

## 2021-04-01 MED ORDER — BIKTARVY 50-200-25 MG PO TABS
1.0000 | ORAL_TABLET | Freq: Every day | ORAL | 11 refills | Status: DC
  Filled 2021-04-01 – 2021-05-06 (×2): qty 30, 30d supply, fill #0
  Filled 2021-06-04: qty 30, 30d supply, fill #1
  Filled 2021-07-14: qty 30, 30d supply, fill #2
  Filled 2021-09-21: qty 30, 30d supply, fill #3
  Filled 2021-10-20: qty 30, 30d supply, fill #4

## 2021-04-01 NOTE — Progress Notes (Signed)
Patient Active Problem List   Diagnosis Date Noted   Human immunodeficiency virus (HIV) disease ([place]) 07/10/2006    Priority: High   Chronic hepatitis C without hepatic coma ([place]) 07/10/2006    Priority: Medium    Dysphagia 01/12/2021   Gastritis 01/12/2021   Screening for colon cancer 05/25/2020   Hypertension 03/27/2019   Grief 03/27/2019   GERD with esophagitis 09/01/2016   Cigarette smoker 12/23/2014   Spontaneous pneumothorax 04/24/2013   Seasonal allergies 01/22/2013   PNEUMOCYSTIS PNEUMONIA 07/10/2006   Alcohol use disorder, moderate, dependence ([place]) 07/10/2006    Patient's Medications  New Prescriptions   No medications on file  Previous Medications   CLOTRIMAZOLE-BETAMETHASONE (LOTRISONE) CREAM    Apply to both feet and between toes bid x 4 weeks.   COVID-19 AT HOME TEST (COVID-19 OTC ANTIGEN 1-PACK VI)       HYDROCHLOROTHIAZIDE (MICROZIDE) 12.5 MG CAPSULE    Take 1 capsule (12.5 mg total) by mouth daily.   LORATADINE (CLARITIN) 10 MG TABLET    Take 1 tablet (10 mg total) by mouth daily.   NALTREXONE (DEPADE) 50 MG TABLET    Take 1/2 tablet  (25 mg total) by mouth daily.   PANTOPRAZOLE (PROTONIX) 40 MG TABLET    Take 1 tablet by mouth every morning   PITAVASTATIN CALCIUM 4 MG TABS    Take 4 mg by mouth daily. This may be placebo and is study provided. Do not fill prescription.   POLYETHYLENE GLYCOL-ELECTROLYTES (NULYTELY) 420 G SOLUTION    Mix and drink by mouth as directed.   TRIAMCINOLONE CREAM (KENALOG) 0.1 %    apply to affected area twice a day   VRAYLAR CAPSULE      Modified Medications   Modified Medication Previous Medication   BICTEGRAVIR-EMTRICITABINE-TENOFOVIR AF (BIKTARVY) 50-200-25 MG TABS TABLET bictegravir-emtricitabine-tenofovir AF (BIKTARVY) 50-200-25 MG TABS tablet      Take 1 tablet by mouth daily.    Take 1 tablet by mouth daily.  Discontinued Medications   No medications on file    Subjective: William [name] is in for his routine HIV  follow-up visit.  He denies any problems obtaining, taking or tolerating his Biktarvy and does not recall missing any doses.  He is not on any new medications since his last visit.  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  He is enjoying his work.  He is feeling well.  Review of Systems: Review of Systems  Constitutional:  Negative for fever and weight loss.   Past Medical History:  Diagnosis Date   Allergy    Folliculitis 05/39/7673   HIV (human immunodeficiency virus infection) ([place])    Hypertension    Pneumothorax     Social History   Tobacco Use   Smoking status: Former    Packs/day: 0.25    Years: 0.00    Pack years: 0.00    Types: Cigarettes    Quit date: 04/09/2018    Years since quitting: 2.9   Smokeless tobacco: Never   Tobacco comments:    Using patches.  Vaping Use   Vaping Use: Never used  Substance Use Topics   Alcohol use: Yes    Alcohol/week: 0.0 standard drinks    Comment: daily   Drug use: Not Currently    Types: Cocaine    Family History  Adopted: Yes  Problem Relation Age of Onset   Heart attack Sister  Hypertension Maternal Aunt    Hypertension Other     No Known Allergies  Health Maintenance  Topic Date Due   TETANUS/TDAP  Never done   Zoster Vaccines- Shingrix (1 of 2) Never done   COVID-19 Vaccine (3 - Booster for Janssen series) 03/13/2020   INFLUENZA VACCINE  09/07/2020   COLONOSCOPY (Pts 45-89yrs Insurance coverage will need to be confirmed)  12/01/2030   Hepatitis C Screening  Completed   HIV Screening  Completed   HPV VACCINES  Aged Out    Objective:  Vitals:   04/01/21 0829  BP: 132/64  Pulse: 79  Temp: 98 F (36.7 C)  TempSrc: Temporal  SpO2: 96%  Weight: 177 lb (80.3 kg)   Body mass index is 24.69 kg/m.  Physical Exam Constitutional:      Comments: He is calm and in good spirits as usual.  Cardiovascular:     Rate and Rhythm: Normal rate and regular rhythm.     Heart sounds:  No murmur heard. Pulmonary:     Effort: Pulmonary effort is normal.     Breath sounds: Normal breath sounds.  Psychiatric:        Mood and Affect: Mood normal.    Lab Results Lab Results  Component Value Date   WBC 6.4 03/12/2020   HGB 15.0 03/12/2020   HCT 44.2 03/12/2020   MCV 89.5 03/12/2020   PLT 307 03/12/2020    Lab Results  Component Value Date   CREATININE 0.81 03/12/2020   BUN 13 03/12/2020   NA 137 03/12/2020   K 4.3 03/12/2020   CL 102 03/12/2020   CO2 25 03/12/2020    Lab Results  Component Value Date   ALT 42 03/12/2020   AST 40 (H) 03/12/2020   ALKPHOS 69 11/04/2016   BILITOT 0.6 03/12/2020    Lab Results  Component Value Date   CHOL 154 02/27/2019   HDL 62 02/27/2019   LDLCALC 73 02/27/2019   TRIG 111 02/27/2019   CHOLHDL 2.5 02/27/2019   Lab Results  Component Value Date   LABRPR NON-REACTIVE 03/12/2020   RPRTITER 1:1 (H) 04/04/2017   HIV 1 RNA Quant (Copies/mL)  Date Value  09/10/2020 <20 (H)  03/12/2020 55 (H)  09/10/2019 23 (H)   CD4 T Cell Abs (/uL)  Date Value  09/10/2020 218 (L)  03/12/2020 282 (L)  09/10/2019 207 (L)     Problem List Items Addressed This Visit       High   Human immunodeficiency virus (HIV) disease ([place])    His infection has been under good, long-term control.  He will get repeat blood work today, continue [name] and follow-up in 6 months.  He received a COVID booster vaccine here today.      Relevant Medications   bictegravir-emtricitabine-tenofovir AF (BIKTARVY) 50-200-25 MG TABS tablet   Other Relevant Orders   T-helper cells (CD4) count (not at [facility])   HIV-1 RNA quant-no reflex-bld   CBC   Comprehensive metabolic panel   RPR      Michel Bickers, MD [facility] for [facility] [phone] pager   [phone] cell 04/01/2021, 8:39 AM

## 2021-04-01 NOTE — Assessment & Plan Note (Signed)
His infection has been under good, long-term control.  He will get repeat blood work today, continue [name] and follow-up in 6 months.  He received a COVID booster vaccine here today.

## 2021-04-03 LAB — COMPREHENSIVE METABOLIC PANEL
AG Ratio: 1.5 (calc) (ref 1.0–2.5)
ALT: 29 U/L (ref 9–46)
AST: 39 U/L — ABNORMAL HIGH (ref 10–35)
Albumin: 4.7 g/dL (ref 3.6–5.1)
Alkaline phosphatase (APISO): 58 U/L (ref 35–144)
BUN: 12 mg/dL (ref 7–25)
CO2: 29 mmol/L (ref 20–32)
Calcium: 9.5 mg/dL (ref 8.6–10.3)
Chloride: 103 mmol/L (ref 98–110)
Creat: 1.04 mg/dL (ref 0.70–1.30)
Globulin: 3.1 g/dL (calc) (ref 1.9–3.7)
Glucose, Bld: 94 mg/dL (ref 65–99)
Potassium: 4.1 mmol/L (ref 3.5–5.3)
Sodium: 138 mmol/L (ref 135–146)
Total Bilirubin: 0.5 mg/dL (ref 0.2–1.2)
Total Protein: 7.8 g/dL (ref 6.1–8.1)

## 2021-04-03 LAB — CBC
HCT: 43.8 % (ref 38.5–50.0)
Hemoglobin: 14.6 g/dL (ref 13.2–17.1)
MCH: 29.7 pg (ref 27.0–33.0)
MCHC: 33.3 g/dL (ref 32.0–36.0)
MCV: 89 fL (ref 80.0–100.0)
MPV: 9.6 fL (ref 7.5–12.5)
Platelets: 295 10*3/uL (ref 140–400)
RBC: 4.92 10*6/uL (ref 4.20–5.80)
RDW: 13.1 % (ref 11.0–15.0)
WBC: 5.3 10*3/uL (ref 3.8–10.8)

## 2021-04-03 LAB — T-HELPER CELLS (CD4) COUNT (NOT AT ARMC)
Absolute CD4: 227 cells/uL — ABNORMAL LOW (ref 490–1740)
CD4 T Helper %: 20 % — ABNORMAL LOW (ref 30–61)
Total lymphocyte count: 1114 cells/uL (ref 850–3900)

## 2021-04-03 LAB — HIV-1 RNA QUANT-NO REFLEX-BLD
HIV 1 RNA Quant: <20 Copies/mL — ABNORMAL HIGH
HIV-1 RNA Quant, Log: <1.3 Log cps/mL — ABNORMAL HIGH

## 2021-04-03 LAB — RPR: RPR Ser Ql: NONREACTIVE

## 2021-04-20 ENCOUNTER — Ambulatory Visit (INDEPENDENT_AMBULATORY_CARE_PROVIDER_SITE_OTHER): Payer: Medicare Other | Admitting: Podiatry

## 2021-04-20 ENCOUNTER — Encounter: Payer: Self-pay | Admitting: Podiatry

## 2021-04-20 ENCOUNTER — Other Ambulatory Visit: Payer: Self-pay

## 2021-04-20 DIAGNOSIS — M79675 Pain in left toe(s): Secondary | ICD-10-CM | POA: Diagnosis not present

## 2021-04-20 DIAGNOSIS — B351 Tinea unguium: Secondary | ICD-10-CM | POA: Diagnosis not present

## 2021-04-20 DIAGNOSIS — M79674 Pain in right toe(s): Secondary | ICD-10-CM

## 2021-04-26 ENCOUNTER — Encounter: Payer: Medicare Other | Admitting: Internal Medicine

## 2021-04-26 ENCOUNTER — Encounter: Payer: Self-pay | Admitting: Internal Medicine

## 2021-04-26 ENCOUNTER — Encounter: Payer: Self-pay | Admitting: Podiatry

## 2021-04-26 NOTE — Progress Notes (Signed)
?  Subjective:  ?Patient ID: William [name], male    DOB: Aug 03, 1960,  MRN: [id number] ? ?William [name] presents to clinic today for at risk foot care. Patient has history of HIV and painful elongated mycotic toenails 1-5 bilaterally which are tender when wearing enclosed shoe gear. Pain is relieved with periodic professional debridement. ? ?New problem(s): None.  ? ?PCP is Jose Persia, MD. Patient has upcoming visit April 26, 2021. ? ?No Known Allergies ? ?Review of Systems: Negative except as noted in the HPI. ? ?Objective: No changes noted in today's physical examination. ? ?General: Patient is a pleasant 61 y.o. African American male WD, WN in NAD. AAO x 3.  ? ?Neurovascular Examination: ?CFT <3 seconds b/l LE. Palpable DP pulse(s) b/l LE. Palpable PT pulse(s) b/l LE. Pedal hair absent. No pain with calf compression b/l. Evidence of chronic venous insufficiency b/l LE. ? ?Protective sensation intact 5/5 intact bilaterally with 10g monofilament b/l. Vibratory sensation intact b/l. ? ?Dermatological:  ?Resolving skin eruption noted bunion area of right foot. No interdigital macerations noted b/l LE. Toenails 1-5 b/l elongated, discolored, dystrophic, thickened, crumbly with subungual debris and tenderness to dorsal palpation. ? ?Musculoskeletal:  ?Normal muscle strength 5/5 to all lower extremity muscle groups bilaterally. HAV with bunion deformity noted b/l LE.Marland Kitchen No pain, crepitus or joint limitation noted with ROM b/l LE.  Patient ambulates independently without assistive aids. ? ?Assessment/Plan: ?1. Pain due to onychomycosis of toenails of both feet   ?  ?-Examined patient. ?-Toenails 1-5 b/l were debrided in length and girth with sterile nail nippers and dremel without iatrogenic bleeding.  ?-Patient/POA to call should there be question/concern in the interim.  ? ?Return in about 3 months (around 07/21/2021). ? ?Marzetta Board, DPM  ?

## 2021-05-05 ENCOUNTER — Other Ambulatory Visit (HOSPITAL_COMMUNITY): Payer: Self-pay

## 2021-05-06 ENCOUNTER — Other Ambulatory Visit (HOSPITAL_COMMUNITY): Payer: Self-pay

## 2021-05-06 ENCOUNTER — Other Ambulatory Visit: Payer: Self-pay | Admitting: Internal Medicine

## 2021-05-06 DIAGNOSIS — F1020 Alcohol dependence, uncomplicated: Secondary | ICD-10-CM

## 2021-05-06 MED ORDER — NALTREXONE HCL 50 MG PO TABS
25.0000 mg | ORAL_TABLET | Freq: Every day | ORAL | 1 refills | Status: DC
  Filled 2021-05-06: qty 30, 60d supply, fill #0
  Filled 2021-09-21: qty 30, 60d supply, fill #1

## 2021-05-18 ENCOUNTER — Other Ambulatory Visit: Payer: Self-pay

## 2021-05-18 ENCOUNTER — Encounter (INDEPENDENT_AMBULATORY_CARE_PROVIDER_SITE_OTHER): Payer: Self-pay | Admitting: *Deleted

## 2021-05-18 DIAGNOSIS — Z006 Encounter for examination for normal comparison and control in clinical research program: Secondary | ICD-10-CM

## 2021-05-18 NOTE — Research (Addendum)
William [name] here for his month 15 Reprieve visit. No new complaints or medications. Verbalized excellent adherence with his Biktarvy and study medication. Denied any muscle aches or weakness. He will return in July for his next study visit.  ?

## 2021-05-19 DIAGNOSIS — Z1152 Encounter for screening for COVID-19: Secondary | ICD-10-CM | POA: Diagnosis not present

## 2021-05-24 DIAGNOSIS — Z1152 Encounter for screening for COVID-19: Secondary | ICD-10-CM | POA: Diagnosis not present

## 2021-05-25 ENCOUNTER — Other Ambulatory Visit (HOSPITAL_COMMUNITY): Payer: Self-pay

## 2021-05-25 ENCOUNTER — Encounter: Payer: Self-pay | Admitting: Internal Medicine

## 2021-06-04 ENCOUNTER — Other Ambulatory Visit: Payer: Self-pay | Admitting: Internal Medicine

## 2021-06-04 ENCOUNTER — Other Ambulatory Visit (HOSPITAL_COMMUNITY): Payer: Self-pay

## 2021-06-04 DIAGNOSIS — I10 Essential (primary) hypertension: Secondary | ICD-10-CM

## 2021-06-28 ENCOUNTER — Other Ambulatory Visit (HOSPITAL_COMMUNITY): Payer: Self-pay

## 2021-06-30 ENCOUNTER — Other Ambulatory Visit (HOSPITAL_COMMUNITY): Payer: Self-pay

## 2021-07-08 DIAGNOSIS — Z1152 Encounter for screening for COVID-19: Secondary | ICD-10-CM | POA: Diagnosis not present

## 2021-07-14 ENCOUNTER — Other Ambulatory Visit (HOSPITAL_COMMUNITY): Payer: Self-pay

## 2021-07-20 DIAGNOSIS — Z20822 Contact with and (suspected) exposure to covid-19: Secondary | ICD-10-CM | POA: Diagnosis not present

## 2021-07-27 ENCOUNTER — Ambulatory Visit (INDEPENDENT_AMBULATORY_CARE_PROVIDER_SITE_OTHER): Payer: Self-pay | Admitting: Podiatry

## 2021-07-27 DIAGNOSIS — Z1152 Encounter for screening for COVID-19: Secondary | ICD-10-CM | POA: Diagnosis not present

## 2021-07-27 DIAGNOSIS — Z91199 Patient's noncompliance with other medical treatment and regimen due to unspecified reason: Secondary | ICD-10-CM

## 2021-07-29 NOTE — Progress Notes (Signed)
   Complete physical exam  Patient: William [name]   DOB: 11/27/1996   61 y.o. Male  MRN: [id number]  Subjective:    No chief complaint on file.   William [name] is a 61 y.o. male who presents today for a complete physical exam. She reports consuming a {diet types:17450} diet. {types:19826} She generally feels {DESC; WELL/FAIRLY WELL/POORLY:18703}. She reports sleeping {DESC; WELL/FAIRLY WELL/POORLY:18703}. She {does/does not:200015} have additional problems to discuss today.    Most recent fall risk assessment:    08/04/2021   10:42 AM  Fall Risk   Falls in the past year? 0  Number falls in past yr: 0  Injury with Fall? 0  Risk for fall due to : No Fall Risks  Follow up Falls evaluation completed     Most recent depression screenings:    08/04/2021   10:42 AM 06/25/2020   10:46 AM  PHQ 2/9 Scores  PHQ - 2 Score 0 0  PHQ- 9 Score 5     {VISON DENTAL STD PSA (Optional):27386}  {History (Optional):23778}  Patient Care Team: Jessup, Joy, NP as PCP - General (Nurse Practitioner)   Outpatient Medications Prior to Visit  Medication Sig   fluticasone (FLONASE) 50 MCG/ACT nasal spray Place 2 sprays into both nostrils in the morning and at bedtime. After 7 days, reduce to once daily.   norgestimate-ethinyl estradiol (SPRINTEC 28) 0.25-35 MG-MCG tablet Take 1 tablet by mouth daily.   Nystatin POWD Apply liberally to affected area 2 times per day   spironolactone (ALDACTONE) 100 MG tablet Take 1 tablet (100 mg total) by mouth daily.   No facility-administered medications prior to visit.    ROS        Objective:     There were no vitals taken for this visit. {Vitals History (Optional):23777}  Physical Exam   No results found for any visits on 09/09/21. {Show previous labs (optional):23779}    Assessment & Plan:    Routine Health Maintenance and Physical Exam  Immunization History  Administered Date(s) Administered   DTaP 02/10/1999, 04/08/1999,  06/17/1999, 03/02/2000, 09/16/2003   Hepatitis A 07/13/2007, 07/18/2008   Hepatitis B 11/28/1998, 01/05/1999, 06/17/1999   HiB (PRP-OMP) 02/10/1999, 04/08/1999, 06/17/1999, 03/02/2000   IPV 02/10/1999, 04/08/1999, 12/06/1999, 09/16/2003   Influenza,inj,Quad PF,6+ Mos 10/18/2013   Influenza-Unspecified 01/18/2012   MMR 12/05/2000, 09/16/2003   Meningococcal Polysaccharide 07/18/2011   Pneumococcal Conjugate-13 03/02/2000   Pneumococcal-Unspecified 06/17/1999, 08/31/1999   Tdap 07/18/2011   Varicella 12/06/1999, 07/13/2007    Health Maintenance  Topic Date Due   HIV Screening  Never done   Hepatitis C Screening  Never done   INFLUENZA VACCINE  09/07/2021   PAP-Cervical Cytology Screening  09/09/2021 (Originally 11/27/2019)   PAP SMEAR-Modifier  09/09/2021 (Originally 11/27/2019)   TETANUS/TDAP  09/09/2021 (Originally 07/17/2021)   HPV VACCINES  Discontinued   COVID-19 Vaccine  Discontinued    Discussed health benefits of physical activity, and encouraged her to engage in regular exercise appropriate for her age and condition.  Problem List Items Addressed This Visit   None Visit Diagnoses     Annual physical exam    -  Primary   Cervical cancer screening       Need for Tdap vaccination          No follow-ups on file.     Joy Jessup, NP   

## 2021-08-03 ENCOUNTER — Other Ambulatory Visit (HOSPITAL_COMMUNITY): Payer: Self-pay

## 2021-08-09 ENCOUNTER — Other Ambulatory Visit (HOSPITAL_COMMUNITY): Payer: Self-pay

## 2021-08-23 ENCOUNTER — Encounter: Payer: Medicare Other | Admitting: *Deleted

## 2021-08-24 ENCOUNTER — Encounter (INDEPENDENT_AMBULATORY_CARE_PROVIDER_SITE_OTHER): Payer: Self-pay | Admitting: *Deleted

## 2021-08-24 ENCOUNTER — Other Ambulatory Visit: Payer: Self-pay

## 2021-08-24 DIAGNOSIS — Z006 Encounter for examination for normal comparison and control in clinical research program: Secondary | ICD-10-CM

## 2021-08-24 NOTE — Research (Unsigned)
William [name] here for his final Reprieve visit. No new complaints or medications. States that he would be interested in future research studies. Told him we would reach out to him as new studies became available. He is scheduled to see Dr. Megan Salon in October and plans to schedule an appointment with his PCP for health maintenance follow up.

## 2021-08-27 ENCOUNTER — Ambulatory Visit (INDEPENDENT_AMBULATORY_CARE_PROVIDER_SITE_OTHER): Payer: Self-pay | Admitting: Podiatry

## 2021-08-27 DIAGNOSIS — Z91199 Patient's noncompliance with other medical treatment and regimen due to unspecified reason: Secondary | ICD-10-CM

## 2021-08-27 NOTE — Progress Notes (Signed)
   Complete physical exam  Patient: William [name]   DOB: 11/27/1996   61 y.o. Male  MRN: [id number]  Subjective:    No chief complaint on file.   William [name] is a 61 y.o. male who presents today for a complete physical exam. She reports consuming a {diet types:17450} diet. {types:19826} She generally feels {DESC; WELL/FAIRLY WELL/POORLY:18703}. She reports sleeping {DESC; WELL/FAIRLY WELL/POORLY:18703}. She {does/does not:200015} have additional problems to discuss today.    Most recent fall risk assessment:    08/04/2021   10:42 AM  Fall Risk   Falls in the past year? 0  Number falls in past yr: 0  Injury with Fall? 0  Risk for fall due to : No Fall Risks  Follow up Falls evaluation completed     Most recent depression screenings:    08/04/2021   10:42 AM 06/25/2020   10:46 AM  PHQ 2/9 Scores  PHQ - 2 Score 0 0  PHQ- 9 Score 5     {VISON DENTAL STD PSA (Optional):27386}  {History (Optional):23778}  Patient Care Team: Jessup, Joy, NP as PCP - General (Nurse Practitioner)   Outpatient Medications Prior to Visit  Medication Sig   fluticasone (FLONASE) 50 MCG/ACT nasal spray Place 2 sprays into both nostrils in the morning and at bedtime. After 7 days, reduce to once daily.   norgestimate-ethinyl estradiol (SPRINTEC 28) 0.25-35 MG-MCG tablet Take 1 tablet by mouth daily.   Nystatin POWD Apply liberally to affected area 2 times per day   spironolactone (ALDACTONE) 100 MG tablet Take 1 tablet (100 mg total) by mouth daily.   No facility-administered medications prior to visit.    ROS        Objective:     There were no vitals taken for this visit. {Vitals History (Optional):23777}  Physical Exam   No results found for any visits on 09/09/21. {Show previous labs (optional):23779}    Assessment & Plan:    Routine Health Maintenance and Physical Exam  Immunization History  Administered Date(s) Administered   DTaP 02/10/1999, 04/08/1999,  06/17/1999, 03/02/2000, 09/16/2003   Hepatitis A 07/13/2007, 07/18/2008   Hepatitis B 11/28/1998, 01/05/1999, 06/17/1999   HiB (PRP-OMP) 02/10/1999, 04/08/1999, 06/17/1999, 03/02/2000   IPV 02/10/1999, 04/08/1999, 12/06/1999, 09/16/2003   Influenza,inj,Quad PF,6+ Mos 10/18/2013   Influenza-Unspecified 01/18/2012   MMR 12/05/2000, 09/16/2003   Meningococcal Polysaccharide 07/18/2011   Pneumococcal Conjugate-13 03/02/2000   Pneumococcal-Unspecified 06/17/1999, 08/31/1999   Tdap 07/18/2011   Varicella 12/06/1999, 07/13/2007    Health Maintenance  Topic Date Due   HIV Screening  Never done   Hepatitis C Screening  Never done   INFLUENZA VACCINE  09/07/2021   PAP-Cervical Cytology Screening  09/09/2021 (Originally 11/27/2019)   PAP SMEAR-Modifier  09/09/2021 (Originally 11/27/2019)   TETANUS/TDAP  09/09/2021 (Originally 07/17/2021)   HPV VACCINES  Discontinued   COVID-19 Vaccine  Discontinued    Discussed health benefits of physical activity, and encouraged her to engage in regular exercise appropriate for her age and condition.  Problem List Items Addressed This Visit   None Visit Diagnoses     Annual physical exam    -  Primary   Cervical cancer screening       Need for Tdap vaccination          No follow-ups on file.     Joy Jessup, NP   

## 2021-09-06 DIAGNOSIS — Z20822 Contact with and (suspected) exposure to covid-19: Secondary | ICD-10-CM | POA: Diagnosis not present

## 2021-09-09 ENCOUNTER — Telehealth: Payer: Self-pay | Admitting: Pharmacist

## 2021-09-09 ENCOUNTER — Encounter: Payer: Self-pay | Admitting: Pharmacist

## 2021-09-09 NOTE — Telephone Encounter (Signed)
[facility] and clinic pharmacy staff have been trying to contact patient for refills of Biktarvy with no luck.  Patient last filled on 07/14/21. Will send a MyChart message.  Leyana Whidden L. Charleigh Correnti, PharmD [facility] Clinical Pharmacist Practitioner

## 2021-09-21 ENCOUNTER — Other Ambulatory Visit (HOSPITAL_COMMUNITY): Payer: Self-pay

## 2021-09-21 ENCOUNTER — Other Ambulatory Visit: Payer: Self-pay | Admitting: Internal Medicine

## 2021-09-21 DIAGNOSIS — I10 Essential (primary) hypertension: Secondary | ICD-10-CM

## 2021-09-21 MED ORDER — PANTOPRAZOLE SODIUM 40 MG PO TBEC
40.0000 mg | DELAYED_RELEASE_TABLET | Freq: Every day | ORAL | 1 refills | Status: DC
  Filled 2021-09-21: qty 30, 30d supply, fill #0
  Filled 2021-11-16: qty 30, 30d supply, fill #1

## 2021-09-21 MED ORDER — HYDROCHLOROTHIAZIDE 12.5 MG PO CAPS
12.5000 mg | ORAL_CAPSULE | Freq: Every day | ORAL | 1 refills | Status: DC
  Filled 2021-09-21: qty 30, 30d supply, fill #0
  Filled 2021-11-16: qty 30, 30d supply, fill #1

## 2021-10-14 ENCOUNTER — Other Ambulatory Visit (HOSPITAL_COMMUNITY): Payer: Self-pay

## 2021-10-20 ENCOUNTER — Other Ambulatory Visit (HOSPITAL_COMMUNITY): Payer: Self-pay

## 2021-11-02 ENCOUNTER — Encounter: Payer: Self-pay | Admitting: Podiatry

## 2021-11-02 ENCOUNTER — Ambulatory Visit (INDEPENDENT_AMBULATORY_CARE_PROVIDER_SITE_OTHER): Payer: Medicare Other | Admitting: Podiatry

## 2021-11-02 DIAGNOSIS — M79675 Pain in left toe(s): Secondary | ICD-10-CM | POA: Diagnosis not present

## 2021-11-02 DIAGNOSIS — B20 Human immunodeficiency virus [HIV] disease: Secondary | ICD-10-CM

## 2021-11-02 DIAGNOSIS — B351 Tinea unguium: Secondary | ICD-10-CM | POA: Diagnosis not present

## 2021-11-02 DIAGNOSIS — R21 Rash and other nonspecific skin eruption: Secondary | ICD-10-CM

## 2021-11-02 DIAGNOSIS — M79674 Pain in right toe(s): Secondary | ICD-10-CM

## 2021-11-02 NOTE — Progress Notes (Signed)
This patient presents to the office with chief complaint of long thick painful nails.  Patient says the nails are painful walking and wearing shoes.  This patient is unable to self treat.  This patient is unable to trim his nails since she is unable to reach his nails.  She presents to the office for preventative foot care services.  General Appearance  Alert, conversant and in no acute stress.  Vascular  Dorsalis pedis and posterior tibial  pulses are palpable  bilaterally.  Capillary return is within normal limits  bilaterally. Temperature is within normal limits  bilaterally.  Neurologic  Senn-Weinstein monofilament wire test within normal limits  bilaterally. Muscle power within normal limits bilaterally.  Nails Thick disfigured discolored nails with subungual debris  from hallux to fifth toes bilaterally. No evidence of bacterial infection or drainage bilaterally.  Orthopedic  No limitations of motion  feet .  No crepitus or effusions noted.  No bony pathology or digital deformities noted.  Skin  normotropic skin with no porokeratosis noted bilaterally.  No signs of infections or ulcers noted.     Onychomycosis  Nails  B/L.  Pain in right toes  Pain in left toes  Debridement of nails both feet followed trimming the nails with dremel tool.    RTC 3 months.   Jolena Kittle DPM   

## 2021-11-11 ENCOUNTER — Other Ambulatory Visit (HOSPITAL_COMMUNITY): Payer: Self-pay

## 2021-11-15 ENCOUNTER — Other Ambulatory Visit (HOSPITAL_COMMUNITY): Payer: Self-pay

## 2021-11-16 ENCOUNTER — Other Ambulatory Visit: Payer: Self-pay

## 2021-11-16 ENCOUNTER — Other Ambulatory Visit (HOSPITAL_COMMUNITY): Payer: Self-pay

## 2021-11-17 ENCOUNTER — Encounter: Payer: Self-pay | Admitting: Internal Medicine

## 2021-11-17 ENCOUNTER — Other Ambulatory Visit: Payer: Self-pay

## 2021-11-17 ENCOUNTER — Ambulatory Visit (INDEPENDENT_AMBULATORY_CARE_PROVIDER_SITE_OTHER): Payer: Medicare Other | Admitting: Internal Medicine

## 2021-11-17 ENCOUNTER — Other Ambulatory Visit (HOSPITAL_COMMUNITY): Payer: Self-pay

## 2021-11-17 VITALS — BP 133/88 | HR 73 | Temp 97.4°F | Resp 16 | Ht 71.0 in | Wt 188.2 lb

## 2021-11-17 DIAGNOSIS — B20 Human immunodeficiency virus [HIV] disease: Secondary | ICD-10-CM | POA: Diagnosis not present

## 2021-11-17 DIAGNOSIS — Z23 Encounter for immunization: Secondary | ICD-10-CM

## 2021-11-17 MED ORDER — BIKTARVY 50-200-25 MG PO TABS
1.0000 | ORAL_TABLET | Freq: Every day | ORAL | 11 refills | Status: DC
  Filled 2021-11-17 – 2021-11-19 (×2): qty 30, 30d supply, fill #0
  Filled 2021-12-22: qty 30, 30d supply, fill #1
  Filled 2022-01-28: qty 30, 30d supply, fill #2
  Filled 2022-03-01: qty 30, 30d supply, fill #3
  Filled 2022-03-28 (×2): qty 30, 30d supply, fill #4
  Filled 2022-04-25: qty 30, 30d supply, fill #5

## 2021-11-17 NOTE — Addendum Note (Signed)
Addended by: Tomi Bamberger on: 11/17/2021 10:56 AM   Modules accepted: Orders

## 2021-11-17 NOTE — Assessment & Plan Note (Signed)
His infection has been under very good, long-term control.  He will get updated lab work today, continue [name] and follow-up in 6 months.  He received his influenza vaccine here today and I recommended getting an updated COVID vaccine at his pharmacy in the next few weeks.

## 2021-11-17 NOTE — Progress Notes (Signed)
Patient Active Problem List   Diagnosis Date Noted   Human immunodeficiency virus (HIV) disease ([place]) 07/10/2006    Priority: High   Chronic hepatitis C without hepatic coma ([place]) 07/10/2006    Priority: Medium    Dysphagia 01/12/2021   Gastritis 01/12/2021   Screening for colon cancer 05/25/2020   Hypertension 03/27/2019   GERD with esophagitis 09/01/2016   Cigarette smoker 12/23/2014   Seasonal allergies 01/22/2013   Alcohol use disorder, moderate, dependence ([place]) 07/10/2006    Patient's Medications  New Prescriptions   No medications on file  Previous Medications   CLOTRIMAZOLE-BETAMETHASONE (LOTRISONE) CREAM    Apply to both feet and between toes bid x 4 weeks.   COVID-19 AT HOME TEST (COVID-19 OTC ANTIGEN 1-PACK VI)       HYDROCHLOROTHIAZIDE (MICROZIDE) 12.5 MG CAPSULE    Take 1 capsule (12.5 mg total) by mouth daily.   LORATADINE (CLARITIN) 10 MG TABLET    Take 1 tablet (10 mg total) by mouth daily.   NALTREXONE (DEPADE) 50 MG TABLET    Take 1/2 tablet (25 mg total) by mouth daily.   PANTOPRAZOLE (PROTONIX) 40 MG TABLET    Take 1 tablet (40 mg total) by mouth daily.   POLYETHYLENE GLYCOL-ELECTROLYTES (NULYTELY) 420 G SOLUTION    Mix and drink by mouth as directed.   TRIAMCINOLONE CREAM (KENALOG) 0.1 %    apply to affected area twice a day   VRAYLAR CAPSULE      Modified Medications   Modified Medication Previous Medication   BICTEGRAVIR-EMTRICITABINE-TENOFOVIR AF (BIKTARVY) 50-200-25 MG TABS TABLET bictegravir-emtricitabine-tenofovir AF (BIKTARVY) 50-200-25 MG TABS tablet      Take 1 tablet by mouth daily.    Take 1 tablet by mouth daily.  Discontinued Medications   No medications on file    Subjective: William [name] is in for his routine HIV follow-up visit.  He denies any problems obtaining, taking or tolerating his Biktarvy.  He takes it each morning when he first gets up.  He does not recall missing doses.  He has been feeling well other than some sinus  congestion due to seasonal allergies.  He says that he used to take Claritin.  He did not know that he can buy it over-the-counter.  Review of Systems: Review of Systems  Constitutional:  Negative for chills, diaphoresis and fever.  HENT:  Positive for congestion. Negative for sore throat.   Respiratory:  Negative for cough.   Cardiovascular:  Negative for chest pain.  Psychiatric/Behavioral:  Negative for depression. The patient is not nervous/anxious.     Past Medical History:  Diagnosis Date   Allergy    Folliculitis 82/50/5397   Grief 03/27/2019   HIV (human immunodeficiency virus infection) ([place])    Hypertension    PNEUMOCYSTIS PNEUMONIA 07/10/2006   Qualifier: Diagnosis of  By: Tomma Lightning MD, Kelly     Pneumothorax    Spontaneous pneumothorax 04/24/2013    Social History   Tobacco Use   Smoking status: Former    Packs/day: 0.25    Years: 0.00    Total pack years: 0.00    Types: Cigarettes    Quit date: 04/09/2018    Years since quitting: 3.6   Smokeless tobacco: Never   Tobacco comments:    Using patches.  Vaping Use   Vaping Use: Never used  Substance Use Topics   Alcohol use: Yes    Alcohol/week: 0.0 standard drinks of alcohol    Comment:  daily   Drug use: Not Currently    Types: Cocaine    Family History  Adopted: Yes  Problem Relation Age of Onset   Heart attack Sister    Hypertension Maternal Aunt    Hypertension Other     No Known Allergies  Health Maintenance  Topic Date Due   TETANUS/TDAP  Never done   Zoster Vaccines- Shingrix (1 of 2) Never done   COVID-19 Vaccine (4 - Janssen risk series) 05/27/2021   INFLUENZA VACCINE  09/07/2021   COLONOSCOPY (Pts 45-46yr Insurance coverage will need to be confirmed)  12/01/2030   Hepatitis C Screening  Completed   HIV Screening  Completed   HPV VACCINES  Aged Out    Objective:  Vitals:   11/17/21 1038  BP: 133/88  Pulse: 73  Resp: 16  Temp: (!) 97.4 F (36.3 C)  TempSrc: Oral  SpO2: 97%   Weight: 188 lb 3.2 oz (85.4 kg)  Height: '5\' 11"'$  (1.803 m)   Body mass index is 26.25 kg/m.  Physical Exam Constitutional:      Comments: He is in good spirits.  Cardiovascular:     Rate and Rhythm: Normal rate and regular rhythm.     Heart sounds: No murmur heard. Pulmonary:     Effort: Pulmonary effort is normal.     Breath sounds: Normal breath sounds.  Psychiatric:        Mood and Affect: Mood normal.     Lab Results Lab Results  Component Value Date   WBC 5.3 04/01/2021   HGB 14.6 04/01/2021   HCT 43.8 04/01/2021   MCV 89.0 04/01/2021   PLT 295 04/01/2021    Lab Results  Component Value Date   CREATININE 1.04 04/01/2021   BUN 12 04/01/2021   NA 138 04/01/2021   K 4.1 04/01/2021   CL 103 04/01/2021   CO2 29 04/01/2021    Lab Results  Component Value Date   ALT 29 04/01/2021   AST 39 (H) 04/01/2021   ALKPHOS 69 11/04/2016   BILITOT 0.5 04/01/2021    Lab Results  Component Value Date   CHOL 154 02/27/2019   HDL 62 02/27/2019   LDLCALC 73 02/27/2019   TRIG 111 02/27/2019   CHOLHDL 2.5 02/27/2019   Lab Results  Component Value Date   LABRPR NON-REACTIVE 04/01/2021   RPRTITER 1:1 (H) 04/04/2017   HIV 1 RNA Quant (Copies/mL)  Date Value  04/01/2021 <20 (H)  09/10/2020 <20 (H)  03/12/2020 55 (H)   CD4 T Cell Abs (/uL)  Date Value  09/10/2020 218 (L)  03/12/2020 282 (L)  09/10/2019 207 (L)     Problem List Items Addressed This Visit       High   Human immunodeficiency virus (HIV) disease ([place]    His infection has been under very good, long-term control.  He will get updated lab work today, continue BAirline pilotand follow-up in 6 months.  He received his influenza vaccine here today and I recommended getting an updated COVID vaccine at his pharmacy in the next few weeks.      Relevant Medications   bictegravir-emtricitabine-tenofovir AF (BIKTARVY) 50-200-25 MG TABS tablet   Other Relevant Orders   [facility]    HIV-1 RNA quant-no reflex-bld      JMichel Bickers MD [facility] Incfor [id number] [phone]   [phone] 11/17/2021, 10:53 AM

## 2021-11-18 LAB — T-HELPER CELLS (CD4) COUNT (NOT AT ARMC)
CD4 % Helper T Cell: 20 % — ABNORMAL LOW (ref 33–65)
CD4 T Cell Abs: 290 /uL — ABNORMAL LOW (ref 400–1790)

## 2021-11-19 ENCOUNTER — Other Ambulatory Visit (HOSPITAL_COMMUNITY): Payer: Self-pay

## 2021-11-20 LAB — HIV-1 RNA QUANT-NO REFLEX-BLD
HIV 1 RNA Quant: <20 Copies/mL — ABNORMAL HIGH
HIV-1 RNA Quant, Log: <1.3 Log cps/mL — ABNORMAL HIGH

## 2021-12-14 ENCOUNTER — Other Ambulatory Visit (HOSPITAL_COMMUNITY): Payer: Self-pay

## 2021-12-15 ENCOUNTER — Other Ambulatory Visit (HOSPITAL_COMMUNITY): Payer: Self-pay

## 2021-12-22 ENCOUNTER — Other Ambulatory Visit (HOSPITAL_COMMUNITY): Payer: Self-pay

## 2021-12-28 ENCOUNTER — Other Ambulatory Visit: Payer: Self-pay

## 2021-12-28 ENCOUNTER — Other Ambulatory Visit (HOSPITAL_COMMUNITY): Payer: Self-pay

## 2021-12-31 ENCOUNTER — Other Ambulatory Visit (HOSPITAL_COMMUNITY): Payer: Self-pay

## 2022-01-11 ENCOUNTER — Other Ambulatory Visit (HOSPITAL_COMMUNITY): Payer: Self-pay

## 2022-01-13 ENCOUNTER — Other Ambulatory Visit (HOSPITAL_COMMUNITY): Payer: Self-pay

## 2022-01-17 ENCOUNTER — Other Ambulatory Visit (HOSPITAL_COMMUNITY): Payer: Self-pay

## 2022-01-28 ENCOUNTER — Other Ambulatory Visit (HOSPITAL_COMMUNITY): Payer: Self-pay

## 2022-02-01 ENCOUNTER — Ambulatory Visit (INDEPENDENT_AMBULATORY_CARE_PROVIDER_SITE_OTHER): Payer: Medicare Other | Admitting: Podiatry

## 2022-02-01 ENCOUNTER — Encounter: Payer: Self-pay | Admitting: Podiatry

## 2022-02-01 DIAGNOSIS — M79675 Pain in left toe(s): Secondary | ICD-10-CM

## 2022-02-01 DIAGNOSIS — B20 Human immunodeficiency virus [HIV] disease: Secondary | ICD-10-CM

## 2022-02-01 DIAGNOSIS — M79674 Pain in right toe(s): Secondary | ICD-10-CM

## 2022-02-01 DIAGNOSIS — B351 Tinea unguium: Secondary | ICD-10-CM | POA: Diagnosis not present

## 2022-02-01 NOTE — Progress Notes (Signed)
This patient presents to the office with chief complaint of long thick painful nails.  Patient says the nails are painful walking and wearing shoes.  This patient is unable to self treat.  This patient is unable to trim his nails since she is unable to reach his nails.  She presents to the office for preventative foot care services.  General Appearance  Alert, conversant and in no acute stress.  Vascular  Dorsalis pedis and posterior tibial  pulses are palpable  bilaterally.  Capillary return is within normal limits  bilaterally. Temperature is within normal limits  bilaterally.  Neurologic  Senn-Weinstein monofilament wire test within normal limits  bilaterally. Muscle power within normal limits bilaterally.  Nails Thick disfigured discolored nails with subungual debris  from hallux to fifth toes bilaterally. No evidence of bacterial infection or drainage bilaterally.  Orthopedic  No limitations of motion  feet .  No crepitus or effusions noted.  No bony pathology or digital deformities noted.  Skin  normotropic skin with no porokeratosis noted bilaterally.  No signs of infections or ulcers noted.     Onychomycosis  Nails  B/L.  Pain in right toes  Pain in left toes  Debridement of nails both feet followed trimming the nails with dremel tool.    RTC 10 weeks    Mazal Ebey DPM   

## 2022-02-21 ENCOUNTER — Other Ambulatory Visit (HOSPITAL_COMMUNITY): Payer: Self-pay

## 2022-02-23 ENCOUNTER — Other Ambulatory Visit (HOSPITAL_COMMUNITY): Payer: Self-pay

## 2022-02-25 ENCOUNTER — Other Ambulatory Visit (HOSPITAL_COMMUNITY): Payer: Self-pay

## 2022-03-01 ENCOUNTER — Other Ambulatory Visit (HOSPITAL_COMMUNITY): Payer: Self-pay

## 2022-03-01 ENCOUNTER — Other Ambulatory Visit: Payer: Self-pay

## 2022-03-08 ENCOUNTER — Other Ambulatory Visit: Payer: Self-pay

## 2022-03-08 MED ORDER — PEG 3350-KCL-NA BICARB-NACL 420 G PO SOLR
ORAL | 0 refills | Status: DC
  Filled 2022-03-08: qty 4000, 1d supply, fill #0

## 2022-03-15 ENCOUNTER — Encounter: Payer: Self-pay | Admitting: Student

## 2022-03-15 ENCOUNTER — Other Ambulatory Visit: Payer: Self-pay

## 2022-03-15 ENCOUNTER — Encounter: Payer: Self-pay | Admitting: *Deleted

## 2022-03-15 ENCOUNTER — Ambulatory Visit (INDEPENDENT_AMBULATORY_CARE_PROVIDER_SITE_OTHER): Payer: 59 | Admitting: *Deleted

## 2022-03-15 ENCOUNTER — Ambulatory Visit (INDEPENDENT_AMBULATORY_CARE_PROVIDER_SITE_OTHER): Payer: 59 | Admitting: Student

## 2022-03-15 VITALS — BP 139/106 | HR 62 | Ht 71.0 in | Wt 192.7 lb

## 2022-03-15 VITALS — BP 139/106 | HR 62 | Temp 98.4°F | Ht 71.0 in | Wt 192.7 lb

## 2022-03-15 DIAGNOSIS — Z0000 Encounter for general adult medical examination without abnormal findings: Secondary | ICD-10-CM | POA: Diagnosis not present

## 2022-03-15 DIAGNOSIS — B20 Human immunodeficiency virus [HIV] disease: Secondary | ICD-10-CM | POA: Diagnosis not present

## 2022-03-15 DIAGNOSIS — I10 Essential (primary) hypertension: Secondary | ICD-10-CM

## 2022-03-15 DIAGNOSIS — K2100 Gastro-esophageal reflux disease with esophagitis, without bleeding: Secondary | ICD-10-CM | POA: Diagnosis not present

## 2022-03-15 DIAGNOSIS — Z87891 Personal history of nicotine dependence: Secondary | ICD-10-CM

## 2022-03-15 DIAGNOSIS — Z23 Encounter for immunization: Secondary | ICD-10-CM | POA: Diagnosis not present

## 2022-03-15 MED ORDER — CLOTRIMAZOLE-BETAMETHASONE 1-0.05 % EX CREA
TOPICAL_CREAM | CUTANEOUS | 1 refills | Status: DC
  Filled 2022-03-15: qty 45, fill #0

## 2022-03-15 MED ORDER — PANTOPRAZOLE SODIUM 40 MG PO TBEC
40.0000 mg | DELAYED_RELEASE_TABLET | Freq: Every day | ORAL | 1 refills | Status: DC
  Filled 2022-03-15: qty 90, 90d supply, fill #0
  Filled 2022-06-30: qty 90, 90d supply, fill #1

## 2022-03-15 MED ORDER — HYDROCHLOROTHIAZIDE 12.5 MG PO CAPS
12.5000 mg | ORAL_CAPSULE | Freq: Every day | ORAL | 3 refills | Status: DC
  Filled 2022-03-15: qty 90, 90d supply, fill #0
  Filled 2022-06-30: qty 90, 90d supply, fill #1

## 2022-03-15 NOTE — Addendum Note (Signed)
Addended by: Virl Axe on: 03/15/2022 02:05 PM   Modules accepted: Level of Service

## 2022-03-15 NOTE — Progress Notes (Addendum)
Subjective:   William [name] is a 62 y.o. male who presents for Medicare Annual/Subsequent preventive examination.  Review of Systems   Defer to pcp        Objective:    Today's Vitals   03/15/22 0841 03/15/22 1004  BP: (!) 138/95 (!) 139/106  Pulse: (!) 58 62  Temp: 98.4 F (36.9 C)   TempSrc: Oral   SpO2: 100%   Weight: 192 lb 11.2 oz (87.4 kg)   Height: '5\' 11"'$  (1.803 m)    Body mass index is 26.88 kg/m.     03/15/2022   11:05 AM 03/15/2022   10:39 AM 08/17/2020    2:49 PM 05/25/2020    1:46 PM 03/27/2019    9:45 AM 11/04/2016    7:36 PM 08/29/2016    9:06 AM  Advanced Directives  Does Patient Have a Medical Advance Directive? Unable to assess, patient is non-responsive or altered mental status No No Yes No No No  Type of Theatre stage manager of [place];Living will     Does patient want to make changes to medical advance directive?    No - Patient declined     Copy of [place] in Chart?    No - copy requested     Would patient like information on creating a medical advance directive? No - Patient declined No - Patient declined Yes (MAU/Ambulatory/Procedural Areas - Information given)  Yes (MAU/Ambulatory/Procedural Areas - Information given) No - Patient declined     Current Medications (verified) Outpatient Encounter Medications as of 03/15/2022  Medication Sig   bictegravir-emtricitabine-tenofovir AF (BIKTARVY) 50-200-25 MG TABS tablet Take 1 tablet by mouth daily.   hydrochlorothiazide (MICROZIDE) 12.5 MG capsule Take 1 capsule (12.5 mg total) by mouth daily.   loratadine (CLARITIN) 10 MG tablet Take 1 tablet (10 mg total) by mouth daily.   pantoprazole (PROTONIX) 40 MG tablet Take 1 tablet (40 mg total) by mouth daily.   [DISCONTINUED] naltrexone (DEPADE) 50 MG tablet Take 1/2 tablet (25 mg total) by mouth daily.   [DISCONTINUED] triamcinolone cream (KENALOG) 0.1 % apply to affected area twice a day   [DISCONTINUED] VRAYLAR  capsule    No facility-administered encounter medications on file as of 03/15/2022.    Allergies (verified) Patient has no allergy information on record.   History: Past Medical History:  Diagnosis Date   Allergy    Chronic hepatitis C without hepatic coma ([place]) 07/10/2006   His hepatitis C was treated and cured.      Folliculitis 23/55/7322   Grief 03/27/2019   HIV (human immunodeficiency virus infection) ([place])    Hypertension    PNEUMOCYSTIS PNEUMONIA 07/10/2006   Qualifier: Diagnosis of  By: Tomma Lightning MD, Claiborne Billings     Pneumothorax    Spontaneous pneumothorax 04/24/2013   Past Surgical History:  Procedure Laterality Date   CHEST TUBE INSERTION     ESOPHAGOGASTRODUODENOSCOPY (EGD) WITH PROPOFOL N/A 08/29/2016   Procedure: ESOPHAGOGASTRODUODENOSCOPY (EGD) WITH PROPOFOL;  Surgeon: Otis Brace, MD;  Location: [facility];  Service: Gastroenterology;  Laterality: N/A;   MULTIPLE EXTRACTIONS WITH ALVEOLOPLASTY N/A 05/23/2014   Procedure: EXTRACTIONS OF ALL REMAINING TEETH WITH ALVEOLOPLASTY, REMOVAL OF LEFT OSSEOUS EXOTOSIS, REMOVAL MAXILLARY OSSEOUS TUBEROSITY;  Surgeon: Diona Browner, DDS;  Location: [facility];  Service: Oral Surgery;  Laterality: N/A;   PLEURADESIS Left 04/26/2013   Procedure: PLEURADESIS;  Surgeon: Gaye Pollack, MD;  Location[facility];  Service: Thoracic;  Laterality: Left;  Mechanical  STAPLING OF BLEBS Left 04/26/2013   Procedure: STAPLING OF BLEBS;  Surgeon: Gaye Pollack, MD;  Location: [facility] OR;  Service: Thoracic;  Laterality: Left;   VIDEO ASSISTED THORACOSCOPY Left 04/26/2013   Procedure: VIDEO ASSISTED THORACOSCOPY;  Surgeon: Gaye Pollack, MD;  Location: [facility] OR;  Service: Thoracic;  Laterality: Left;   Family History  Adopted: Yes  Problem Relation Age of Onset   Heart attack Sister    Hypertension Maternal Aunt    Hypertension Other    Social History   Socioeconomic History   Marital status: Single    Spouse name: Not on file   Number of children: 0    Years of education: Not on file   Highest education level: 12th grade  Occupational History   Not on file  Tobacco Use   Smoking status: Former    Packs/day: 0.25    Years: 0.00    Total pack years: 0.00    Types: Cigarettes    Quit date: 04/09/2018    Years since quitting: 3.9   Smokeless tobacco: Never   Tobacco comments:    Using patches.  Vaping Use   Vaping Use: Never used  Substance and Sexual Activity   Alcohol use: Yes    Alcohol/week: 0.0 standard drinks of alcohol    Comment: daily   Drug use: Not Currently    Types: Cocaine   Sexual activity: Yes    Partners: Female    Comment: declined condoms  Other Topics Concern   Not on file  Social History Narrative   Current Social History 08/17/2020        Patient lives with three roommates in an home which is 1 story There are 4 steps up to the entrance the patient uses. There is a handrail on each side      Patient's method of transportation is city bus.      The highest level of education was high school diploma.      The patient currently disabled.      Identified important Relationships are: mother       Pets : none       Interests / Fun: "go to the park and play basketball. Socialize"       Current Stressors: "I don't really have too much stress in my life"       Religious / Personal Beliefs: Baptist       Other: "I am just an outgoing person."       Social Determinants of Health   Financial Resource Strain: Low Risk  (03/15/2022)   Overall Financial Resource Strain (CARDIA)    Difficulty of Paying Living Expenses: Not very hard  Food Insecurity: No Food Insecurity (03/15/2022)   Hunger Vital Sign    Worried About Running Out of Food in the Last Year: Never true    Ran Out of Food in the Last Year: Never true  Transportation Needs: No Transportation Needs (03/15/2022)   PRAPARE - Hydrologist (Medical): No    Lack of Transportation (Non-Medical): No  Physical Activity:  Insufficiently Active (03/15/2022)   Exercise Vital Sign    Days of Exercise per Week: 2 days    Minutes of Exercise per Session: 30 min  Stress: No Stress Concern Present (08/17/2020)   [place]    Feeling of Stress : Not at all  Social Connections: Socially Isolated (03/15/2022)   Social Connection and Isolation  Panel [NHANES]    Frequency of Communication with Friends and Family: Twice a week    Frequency of Social Gatherings with Friends and Family: Never    Attends Religious Services: Never    Marine scientist or Organizations: No    Attends Archivist Meetings: Never    Marital Status: Widowed    Tobacco Counseling Counseling given: Not Answered Tobacco comments: Using patches.   Clinical Intake:  Pre-visit preparation completed: Yes  Pain : No/denies pain     BMI - recorded: 26.88 Nutritional Status: BMI 25 -29 Overweight Nutritional Risks: None Diabetes: No  How often do you need to have someone help you when you read instructions, pamphlets, or other written materials from your doctor or pharmacy?: 1 - Never  Diabetic?NO  Interpreter Needed?: No  Information entered by :: KGOLDSTON,CMA   Activities of Daily Living    03/15/2022   10:40 AM 03/15/2022    8:49 AM  In your present state of health, do you have any difficulty performing the following activities:  Hearing? 0 0  Vision? 0 0  Difficulty concentrating or making decisions? 0 0  Walking or climbing stairs? 0 0  Dressing or bathing? 0 0  Doing errands, shopping? 0 0    Patient Care Team: Linus Galas, MD as PCP - General Michel Bickers, MD as PCP - Infectious Diseases (Infectious Diseases) Michel Bickers, MD as Consulting Physician (Infectious Diseases)  Indicate any recent Medical Services you may have received from other than [facility] providers in the past year (date may be approximate).     Assessment:    This is a routine wellness examination for [facility].  Hearing/Vision screen No results found.  Dietary issues and exercise activities discussed:     Goals Addressed   None   Depression Screen    03/15/2022   10:41 AM 03/15/2022    8:49 AM 11/17/2021   10:41 AM 08/17/2020   11:16 AM 05/25/2020    1:46 PM 09/24/2019    9:21 AM 07/25/2019    2:29 PM  PHQ 2/9 Scores  PHQ - 2 Score 0 0 0 0 0 0 0  PHQ- 9 Score 0 0     0    Fall Risk    03/15/2022   10:40 AM 11/17/2021   10:40 AM 08/17/2020   11:22 AM 05/25/2020    1:45 PM 09/24/2019    9:23 AM  [place] in the past year? 0 0 0 0 0  Number falls in past yr: 0 0     Injury with Fall? 0 0     Risk for fall due to : No Fall Risks No Fall Risks No Fall Risks No Fall Risks   Follow up Falls evaluation completed Falls evaluation completed       FALL RISK PREVENTION PERTAINING TO THE HOME:  Any stairs in or around the home? No  If so, are there any without handrails? No  Home free of loose throw rugs in walkways, pet beds, electrical cords, etc? No  Adequate lighting in your home to reduce risk of falls? Yes   ASSISTIVE DEVICES UTILIZED TO PREVENT FALLS:  Life alert? No  Use of a cane, walker or w/c? No  Grab bars in the bathroom? No  Shower chair or bench in shower? No  Elevated toilet seat or a handicapped toilet? No   TIMED UP AND GO:  Was the test performed? No .  Length of time  to ambulate 10 feet: 0 sec.   Gait steady and fast without use of assistive device  Cognitive Function:        03/15/2022   10:40 AM 08/17/2020   11:25 AM  6CIT Screen  What Year? 0 points 0 points  What month? 0 points 0 points  What time? 0 points 0 points  Count back from 20 0 points 0 points  Months in reverse 2 points 0 points  Repeat phrase 0 points 0 points  Total Score 2 points 0 points    Immunizations Immunization History  Administered Date(s) Administered   Hepatitis A 03/16/2005, 09/15/2005   Influenza Split  10/21/2010, 11/09/2011   Influenza Whole 11/03/2005, 12/20/2007, 11/13/2008, 10/28/2009   Influenza,inj,Quad PF,6+ Mos 10/17/2012, 03/11/2014, 11/06/2015, 10/23/2017, 11/27/2018, 03/26/2020, 11/17/2021   Influenza-Unspecified 11/12/2014, 01/11/2017   Janssen (J&J) SARS-COV-2 Vaccination 04/26/2019, 01/17/2020   Pfizer Covid-19 Vaccine Bivalent Booster 63yr & up 04/01/2021   Pneumococcal Conjugate-13 10/23/2017   Pneumococcal Polysaccharide-23 07/10/2003, 02/16/2010   Tdap 03/15/2022   Vaccinia,smallpox Monkeypox Vaccine Live,pf 10/16/2020    TDAP status: Completed at today's visit  Flu Vaccine status: Up to date  Pneumococcal vaccine status: Up to date  Covid-19 vaccine status: Completed vaccines  Qualifies for Shingles Vaccine? Yes   Zostavax completed No   Shingrix Completed?: No.    Education has been provided regarding the importance of this vaccine. Patient has been advised to call insurance company to determine out of pocket expense if they have not yet received this vaccine. Advised may also receive vaccine at local pharmacy or Health Dept. Verbalized acceptance and understanding.  Screening Tests Health Maintenance  Topic Date Due   DTaP/Tdap/Td (1 - Tdap) Never done   Zoster Vaccines- Shingrix (1 of 2) Never done   COVID-19 Vaccine (4 - 2023-24 season) 10/08/2021   Medicare Annual Wellness (AWV)  03/16/2023   COLONOSCOPY (Pts 45-437yrInsurance coverage will need to be confirmed)  12/01/2030   INFLUENZA VACCINE  Completed   Hepatitis C Screening  Completed   HIV Screening  Completed   HPV VACCINES  Aged Out    Health Maintenance  Health Maintenance Due  Topic Date Due   DTaP/Tdap/Td (1 - Tdap) Never done   Zoster Vaccines- Shingrix (1 of 2) Never done   COVID-19 Vaccine (4 - 2023-24 season) 10/08/2021    Colorectal cancer screening: Type of screening: Colonoscopy. Completed 11/30/2020. Repeat every 10 years  Lung Cancer Screening: (Low Dose CT Chest  recommended if Age 60-80ears, 30 pack-year currently smoking OR have quit w/in 15years.) does qualify.   Lung Cancer Screening Referral: defer to pcp Additional Screening:  Hepatitis C Screening: does not qualify; Completed 03/15/2022  Vision Screening: Recommended annual ophthalmology exams for early detection of glaucoma and other disorders of the eye. Is the patient up to date with their annual eye exam?  No  Who is the provider or what is the name of the office in which the patient attends annual eye exams? unknown If pt is not established with a provider, would they like to be referred to a provider to establish care?  Pt does not want referral at this time .   Dental Screening: Recommended annual dental exams for proper oral hygiene  Community Resource Referral / Chronic Care Management: CRR required this visit?  No   CCM required this visit?  No      Plan:     I have personally reviewed and noted the following in the patient's chart:  Medical and social history Use of alcohol, tobacco or illicit drugs  Current medications and supplements including opioid prescriptions. Patient is not currently taking opioid prescriptions. Functional ability and status Nutritional status Physical activity Advanced directives List of other physicians Hospitalizations, surgeries, and ER visits in previous 12 months Vitals Screenings to include cognitive, depression, and falls Referrals and appointments  In addition, I have reviewed and discussed with patient certain preventive protocols, quality metrics, and best practice recommendations. A written personalized care plan for preventive services as well as general preventive health recommendations were provided to patient.     Nicoletta Dress, [place]   03/15/2022   Nurse Notes: face to face   Mr. Stubblefield , Thank you for taking time to come for your Medicare Wellness Visit. I appreciate your ongoing commitment to your health  goals. Please review the following plan we discussed and let me know if I can assist you in the future.   These are the goals we discussed:  Goals      Exercise daily (30 min per time)     "I try to walk every single day and play basketball 2x/week."        This is a list of the screening recommended for you and due dates:  Health Maintenance  Topic Date Due   DTaP/Tdap/Td vaccine (1 - Tdap) Never done   Zoster (Shingles) Vaccine (1 of 2) Never done   COVID-19 Vaccine (4 - 2023-24 season) 10/08/2021   Medicare Annual Wellness Visit  03/16/2023   Colon Cancer Screening  12/01/2030   Flu Shot  Completed   Hepatitis C Screening: USPSTF Recommendation to screen - Ages 18-79 yo.  Completed   HIV Screening  Completed   HPV Vaccine  Aged Out

## 2022-03-15 NOTE — Progress Notes (Signed)
I reviewed the AWV findings with the provider who conducted the visit. I was present in the office suite and immediately available to provide assistance and direction throughout the time the service was provided.  

## 2022-03-15 NOTE — Addendum Note (Signed)
Addended by: Sander Nephew F on: 03/15/2022 10:57 AM   Modules accepted: Orders

## 2022-03-15 NOTE — Patient Instructions (Signed)
Health Maintenance, Male Adopting a healthy lifestyle and getting preventive care are important in promoting health and wellness. Ask your health care provider about: The right schedule for you to have regular tests and exams. Things you can do on your own to prevent diseases and keep yourself healthy. What should I know about diet, weight, and exercise? Eat a healthy diet  Eat a diet that includes plenty of vegetables, fruits, low-fat dairy products, and lean protein. Do not eat a lot of foods that are high in solid fats, added sugars, or sodium. Maintain a healthy weight Body mass index (BMI) is a measurement that can be used to identify possible weight problems. It estimates body fat based on height and weight. Your health care provider can help determine your BMI and help you achieve or maintain a healthy weight. Get regular exercise Get regular exercise. This is one of the most important things you can do for your health. Most adults should: Exercise for at least 150 minutes each week. The exercise should increase your heart rate and make you sweat (moderate-intensity exercise). Do strengthening exercises at least twice a week. This is in addition to the moderate-intensity exercise. Spend less time sitting. Even light physical activity can be beneficial. Watch cholesterol and blood lipids Have your blood tested for lipids and cholesterol at 60 years of age, then have this test every 5 years. You may need to have your cholesterol levels checked more often if: Your lipid or cholesterol levels are high. You are older than 60 years of age. You are at high risk for heart disease. What should I know about cancer screening? Many types of cancers can be detected early and may often be prevented. Depending on your health history and family history, you may need to have cancer screening at various ages. This may include screening for: Colorectal cancer. Prostate cancer. Skin cancer. Lung  cancer. What should I know about heart disease, diabetes, and high blood pressure? Blood pressure and heart disease High blood pressure causes heart disease and increases the risk of stroke. This is more likely to develop in people who have high blood pressure readings or are overweight. Talk with your health care provider about your target blood pressure readings. Have your blood pressure checked: Every 3-5 years if you are 18-39 years of age. Every year if you are 40 years old or older. If you are between the ages of 65 and 75 and are a current or former smoker, ask your health care provider if you should have a one-time screening for abdominal aortic aneurysm (AAA). Diabetes Have regular diabetes screenings. This checks your fasting blood sugar level. Have the screening done: Once every three years after age 45 if you are at a normal weight and have a low risk for diabetes. More often and at a younger age if you are overweight or have a high risk for diabetes. What should I know about preventing infection? Hepatitis B If you have a higher risk for hepatitis B, you should be screened for this virus. Talk with your health care provider to find out if you are at risk for hepatitis B infection. Hepatitis C Blood testing is recommended for: Everyone born from 1945 through 1965. Anyone with known risk factors for hepatitis C. Sexually transmitted infections (STIs) You should be screened each year for STIs, including gonorrhea and chlamydia, if: You are sexually active and are younger than 60 years of age. You are older than 60 years of age and your   health care provider tells you that you are at risk for this type of infection. Your sexual activity has changed since you were last screened, and you are at increased risk for chlamydia or gonorrhea. Ask your health care provider if you are at risk. Ask your health care provider about whether you are at high risk for HIV. Your health care provider  may recommend a prescription medicine to help prevent HIV infection. If you choose to take medicine to prevent HIV, you should first get tested for HIV. You should then be tested every 3 months for as long as you are taking the medicine. Follow these instructions at home: Alcohol use Do not drink alcohol if your health care provider tells you not to drink. If you drink alcohol: Limit how much you have to 0-2 drinks a day. Know how much alcohol is in your drink. In the [place] one drink equals one 12 oz bottle of beer (355 mL), one 5 oz glass of wine (148 mL), or one 1 oz glass of hard liquor (44 mL). Lifestyle Do not use any products that contain nicotine or tobacco. These products include cigarettes, chewing tobacco, and vaping devices, such as e-cigarettes. If you need help quitting, ask your health care provider. Do not use street drugs. Do not share needles. Ask your health care provider for help if you need support or information about quitting drugs. General instructions Schedule regular health, dental, and eye exams. Stay current with your vaccines. Tell your health care provider if: You often feel depressed. You have ever been abused or do not feel safe at home. Summary Adopting a healthy lifestyle and getting preventive care are important in promoting health and wellness. Follow your health care provider's instructions about healthy diet, exercising, and getting tested or screened for diseases. Follow your health care provider's instructions on monitoring your cholesterol and blood pressure. This information is not intended to replace advice given to you by your health care provider. Make sure you discuss any questions you have with your health care provider. Document Revised: 06/15/2020 Document Reviewed: 06/15/2020 Elsevier Patient Education  2023 Elsevier Inc.  

## 2022-03-15 NOTE — Progress Notes (Signed)
   CC: medication refills  HPI:  William [name] is a 62 y.o. male with history listed below presenting to the [facility] for medication refills. Please see individualized problem based charting for full HPI.  Past Medical History:  Diagnosis Date   Allergy    Chronic hepatitis C without hepatic coma ([place]) 07/10/2006   His hepatitis C was treated and cured.      Folliculitis 12/01/8525   Grief 03/27/2019   HIV (human immunodeficiency virus infection) ([place])    Hypertension    PNEUMOCYSTIS PNEUMONIA 07/10/2006   Qualifier: Diagnosis of  By: Tomma Lightning MD, [place]     Pneumothorax    Spontaneous pneumothorax 04/24/2013    Review of Systems:  Negative aside from that listed in individualized problem based charting.  Physical Exam:  Vitals:   03/15/22 0841  BP: (!) 138/95  Pulse: (!) 58  SpO2: 100%  Weight: 192 lb 11.2 oz (87.4 kg)  Height: '5\' 11"'$  (1.803 m)   Physical Exam Constitutional:      Appearance: Normal appearance. He is normal weight. He is not ill-appearing.  HENT:     Mouth/Throat:     Mouth: Mucous membranes are moist.     Pharynx: Oropharynx is clear.  Eyes:     General: No scleral icterus.    Extraocular Movements: Extraocular movements intact.     Conjunctiva/sclera: Conjunctivae normal.     Pupils: Pupils are equal, round, and reactive to light.  Cardiovascular:     Rate and Rhythm: Normal rate and regular rhythm.     Heart sounds: Normal heart sounds. No murmur heard.    No gallop.  Pulmonary:     Effort: Pulmonary effort is normal.     Breath sounds: Normal breath sounds. No wheezing, rhonchi or rales.  Abdominal:     General: Bowel sounds are normal. There is no distension.     Palpations: Abdomen is soft.     Tenderness: There is no abdominal tenderness.  Musculoskeletal:        General: No swelling. Normal range of motion.  Skin:    General: Skin is warm and dry.  Neurological:     General: No focal deficit present.     Mental Status: He is  alert and oriented to person, place, and time.  Psychiatric:        Mood and Affect: Mood normal.        Behavior: Behavior normal.      Assessment & Plan:   See Encounters Tab for problem based charting.  Patient discussed with Dr. Heber Laramie

## 2022-03-15 NOTE — Patient Instructions (Addendum)
Mr. William [name],  It was a pleasure seeing you in the clinic today.   I have refilled your medications as requested.  I am glad to hear that you are doing fine. Please reach out to let us know if we can help with anything moving forward. I have scheduled your next visit with [place] in 2 months.  Please call our clinic at [phone] if you have any questions or concerns. The best time to call is Monday-Friday from 9am-4pm, but there is someone available 24/7 at the same number. If you need medication refills, please notify your pharmacy one week in advance and they will send [place] a request.   Thank you for letting us take part in your care. We look forward to seeing you next time!

## 2022-03-15 NOTE — Assessment & Plan Note (Signed)
Patient living with HTN, currently on HCTZ 12.'5mg'$  daily. BP mildly elevated to 138/95 today. He was recently incarcerated and had ran out of his medications for over a month. Will refill his HCTZ and reassess BP at next visit in 2 months.  Plan: -refilled hctz -next visit in 2 months

## 2022-03-15 NOTE — Assessment & Plan Note (Signed)
Still experiencing reflux symptoms particularly at night.  -refilled protonix

## 2022-03-21 NOTE — Progress Notes (Signed)
Internal Medicine Clinic Attending  Case discussed with the resident at the time of the visit.  We reviewed the resident's history and exam and pertinent patient test results.  I agree with the assessment, diagnosis, and plan of care documented in the resident's note.  

## 2022-03-23 ENCOUNTER — Other Ambulatory Visit (HOSPITAL_COMMUNITY): Payer: Self-pay

## 2022-03-25 ENCOUNTER — Other Ambulatory Visit (HOSPITAL_COMMUNITY): Payer: Self-pay

## 2022-03-28 ENCOUNTER — Other Ambulatory Visit (HOSPITAL_COMMUNITY): Payer: Self-pay

## 2022-03-28 ENCOUNTER — Other Ambulatory Visit: Payer: Self-pay

## 2022-04-12 ENCOUNTER — Encounter: Payer: Self-pay | Admitting: Podiatry

## 2022-04-12 ENCOUNTER — Ambulatory Visit (INDEPENDENT_AMBULATORY_CARE_PROVIDER_SITE_OTHER): Payer: 59 | Admitting: Podiatry

## 2022-04-12 DIAGNOSIS — M79674 Pain in right toe(s): Secondary | ICD-10-CM

## 2022-04-12 DIAGNOSIS — B20 Human immunodeficiency virus [HIV] disease: Secondary | ICD-10-CM

## 2022-04-12 DIAGNOSIS — M79675 Pain in left toe(s): Secondary | ICD-10-CM

## 2022-04-12 DIAGNOSIS — B351 Tinea unguium: Secondary | ICD-10-CM | POA: Diagnosis not present

## 2022-04-12 DIAGNOSIS — R21 Rash and other nonspecific skin eruption: Secondary | ICD-10-CM

## 2022-04-12 NOTE — Progress Notes (Signed)
This patient presents to the office with chief complaint of long thick painful nails.  Patient says the nails are painful walking and wearing shoes.  This patient is unable to self treat.  This patient is unable to trim his nails since she is unable to reach his nails.  She presents to the office for preventative foot care services.  General Appearance  Alert, conversant and in no acute stress.  Vascular  Dorsalis pedis and posterior tibial  pulses are palpable  bilaterally.  Capillary return is within normal limits  bilaterally. Temperature is within normal limits  bilaterally.  Neurologic  Senn-Weinstein monofilament wire test within normal limits  bilaterally. Muscle power within normal limits bilaterally.  Nails Thick disfigured discolored nails with subungual debris  from hallux to fifth toes bilaterally. No evidence of bacterial infection or drainage bilaterally.  Orthopedic  No limitations of motion  feet .  No crepitus or effusions noted.  No bony pathology or digital deformities noted.  Skin  normotropic skin with no porokeratosis noted bilaterally.  No signs of infections or ulcers noted.     Onychomycosis  Nails  B/L.  Pain in right toes  Pain in left toes  Debridement of nails both feet followed trimming the nails with dremel tool.    RTC 10 weeks    Gardiner Barefoot DPM

## 2022-04-14 ENCOUNTER — Telehealth: Payer: Self-pay

## 2022-04-14 NOTE — Telephone Encounter (Signed)
Patient attempted to be outreached by Estelle June, PharmD Candidate on 04/14/22 to discuss hypertension. Left voicemail for patient to return our call at their convenience at [phone].    [place] [facility] PharmD Candidate [phone].   Maryan Puls, PharmD PGY-1 [facility] Resident

## 2022-04-19 ENCOUNTER — Other Ambulatory Visit (HOSPITAL_COMMUNITY): Payer: Self-pay

## 2022-04-22 ENCOUNTER — Other Ambulatory Visit (HOSPITAL_COMMUNITY): Payer: Self-pay

## 2022-04-25 ENCOUNTER — Other Ambulatory Visit (HOSPITAL_COMMUNITY): Payer: Self-pay

## 2022-04-27 ENCOUNTER — Other Ambulatory Visit (HOSPITAL_COMMUNITY): Payer: Self-pay

## 2022-05-11 ENCOUNTER — Other Ambulatory Visit (HOSPITAL_COMMUNITY): Payer: Self-pay

## 2022-05-11 ENCOUNTER — Other Ambulatory Visit: Payer: Self-pay

## 2022-05-11 ENCOUNTER — Ambulatory Visit (INDEPENDENT_AMBULATORY_CARE_PROVIDER_SITE_OTHER): Payer: 59 | Admitting: Internal Medicine

## 2022-05-11 ENCOUNTER — Encounter: Payer: Self-pay | Admitting: Internal Medicine

## 2022-05-11 VITALS — BP 128/87 | HR 70 | Temp 97.0°F | Ht 71.0 in | Wt 191.0 lb

## 2022-05-11 DIAGNOSIS — B20 Human immunodeficiency virus [HIV] disease: Secondary | ICD-10-CM | POA: Diagnosis not present

## 2022-05-11 DIAGNOSIS — Z23 Encounter for immunization: Secondary | ICD-10-CM

## 2022-05-11 MED ORDER — BIKTARVY 50-200-25 MG PO TABS
1.0000 | ORAL_TABLET | Freq: Every day | ORAL | 11 refills | Status: DC
Start: 2022-05-11 — End: 2023-05-30
  Filled 2022-05-11 – 2022-06-01 (×2): qty 30, 30d supply, fill #0
  Filled 2022-06-30: qty 30, 30d supply, fill #1
  Filled 2022-07-26: qty 30, 30d supply, fill #2
  Filled 2022-08-24: qty 30, 30d supply, fill #3
  Filled 2022-09-16: qty 30, 30d supply, fill #4
  Filled 2022-10-14: qty 30, 30d supply, fill #5
  Filled 2022-11-21: qty 30, 30d supply, fill #6
  Filled 2022-12-08 – 2022-12-14 (×2): qty 30, 30d supply, fill #7
  Filled 2023-01-09: qty 30, 30d supply, fill #8
  Filled 2023-03-02: qty 30, 30d supply, fill #9
  Filled 2023-04-12: qty 30, 30d supply, fill #10

## 2022-05-11 NOTE — Progress Notes (Signed)
Patient Active Problem List   Diagnosis Date Noted   Human immunodeficiency virus (HIV) disease 07/10/2006    Priority: High   Dysphagia 01/12/2021   Gastritis 01/12/2021   Screening for colon cancer 05/25/2020   Hypertension 03/27/2019   GERD with esophagitis 09/01/2016   Cigarette smoker 12/23/2014   Seasonal allergies 01/22/2013   Alcohol use disorder, moderate, dependence 07/10/2006    Patient's Medications  New Prescriptions   No medications on file  Previous Medications   HYDROCHLOROTHIAZIDE (MICROZIDE) 12.5 MG CAPSULE    Take 1 capsule (12.5 mg total) by mouth daily.   LORATADINE (CLARITIN) 10 MG TABLET    Take 1 tablet (10 mg total) by mouth daily.   PANTOPRAZOLE (PROTONIX) 40 MG TABLET    Take 1 tablet (40 mg total) by mouth daily.  Modified Medications   Modified Medication Previous Medication   BICTEGRAVIR-EMTRICITABINE-TENOFOVIR AF (BIKTARVY) 50-200-25 MG TABS TABLET bictegravir-emtricitabine-tenofovir AF (BIKTARVY) 50-200-25 MG TABS tablet      Take 1 tablet by mouth daily.    Take 1 tablet by mouth daily.  Discontinued Medications   No medications on file    Subjective: William [name] is in for his routine HIV follow-up visit.  He denies any problems obtaining, taking or tolerating his Biktarvy.  He takes it each morning and does not recall missing doses.  He is feeling well.  Review of Systems: Review of Systems  Constitutional:  Negative for fever and weight loss.    Past Medical History:  Diagnosis Date   Allergy    Chronic hepatitis C without hepatic coma 07/10/2006   His hepatitis C was treated and cured.      Folliculitis [id number]   Grief 03/27/2019   HIV (human immunodeficiency virus infection)    Hypertension    PNEUMOCYSTIS PNEUMONIA 07/10/2006   Qualifier: Diagnosis of  By: Tomma Lightning MD, Claiborne Billings     Pneumothorax    Spontaneous pneumothorax 04/24/2013    Social History   Tobacco Use   Smoking status: Former    Packs/day: 0.25     Years: 0.00    Additional pack years: 0.00    Total pack years: 0.00    Types: Cigarettes    Quit date: 04/09/2018    Years since quitting: 4.0   Smokeless tobacco: Never   Tobacco comments:    Using patches.  Vaping Use   Vaping Use: Never used  Substance Use Topics   Alcohol use: Yes    Alcohol/week: 0.0 standard drinks of alcohol    Comment: daily   Drug use: Not Currently    Types: Cocaine    Family History  Adopted: Yes  Problem Relation Age of Onset   Heart attack Sister    Hypertension Maternal Aunt    Hypertension Other     No Known Allergies  Health Maintenance  Topic Date Due   Zoster Vaccines- Shingrix (1 of 2) Never done   COVID-19 Vaccine (4 - 2023-24 season) 10/08/2021   INFLUENZA VACCINE  09/08/2022   Medicare Annual Wellness (AWV)  03/16/2023   COLONOSCOPY (Pts 45-56yrs Insurance coverage will need to be confirmed)  03/10/2032   DTaP/Tdap/Td (2 - Td or Tdap) 03/15/2032   Hepatitis C Screening  Completed   HIV Screening  Completed   HPV VACCINES  Aged Out    Objective:  Vitals:   05/11/22 0918  BP: 128/87  Pulse: 70  Temp: (!) 97 F (36.1 C)  TempSrc: Temporal  SpO2:  96%  Weight: 191 lb (86.6 kg)  Height: 5\' 11"  (1.803 m)   Body mass index is 26.64 kg/m.  Physical Exam Constitutional:      Comments: The spirits are good as usual.  Cardiovascular:     Rate and Rhythm: Normal rate and regular rhythm.     Heart sounds: No murmur heard. Pulmonary:     Effort: Pulmonary effort is normal.     Breath sounds: Normal breath sounds.  Psychiatric:        Mood and Affect: Mood normal.     Lab Results Lab Results  Component Value Date   WBC 5.3 04/01/2021   HGB 14.6 04/01/2021   HCT 43.8 04/01/2021   MCV 89.0 04/01/2021   PLT 295 04/01/2021    Lab Results  Component Value Date   CREATININE 1.04 04/01/2021   BUN 12 04/01/2021   NA 138 04/01/2021   K 4.1 04/01/2021   CL 103 04/01/2021   CO2 29 04/01/2021    Lab Results   Component Value Date   ALT 29 04/01/2021   AST 39 (H) 04/01/2021   ALKPHOS 69 11/04/2016   BILITOT 0.5 04/01/2021    Lab Results  Component Value Date   CHOL 154 02/27/2019   HDL 62 02/27/2019   LDLCALC 73 02/27/2019   TRIG 111 02/27/2019   CHOLHDL 2.5 02/27/2019   Lab Results  Component Value Date   LABRPR NON-REACTIVE 04/01/2021   RPRTITER 1:1 (H) 04/04/2017   HIV 1 RNA Quant (Copies/mL)  Date Value  11/17/2021 <20 (H)  04/01/2021 <20 (H)  09/10/2020 <20 (H)   CD4 T Cell Abs (/uL)  Date Value  11/17/2021 290 (L)  09/10/2020 218 (L)  03/12/2020 282 (L)     Problem List Items Addressed This Visit       High   Human immunodeficiency virus (HIV) disease    His infection has been under excellent, long-term control.  He will update his blood work today, continue [name] and follow-up in 6 months.      Relevant Medications   bictegravir-emtricitabine-tenofovir AF (BIKTARVY) 50-200-25 MG TABS tablet   Other Relevant Orders   T-helper cells (CD4) count (not at [facility])   HIV-1 RNA quant-no reflex-bld   CBC   Comprehensive metabolic panel   RPR   Lipid panel   Other Visit Diagnoses     Need for pneumococcal 20-valent conjugate vaccination    -  Primary   Relevant Orders   Pneumococcal conjugate vaccine 20-valent (Prevnar-20)         Michel Bickers, MD [facility] for [facility] [phone] pager   [phone] cell 05/11/2022, 9:52 AM

## 2022-05-11 NOTE — Assessment & Plan Note (Signed)
His infection has been under excellent, long-term control.  He will update his blood work today, continue [name] and follow-up in 6 months.

## 2022-05-12 LAB — T-HELPER CELLS (CD4) COUNT (NOT AT ARMC)
CD4 % Helper T Cell: 20 % — ABNORMAL LOW (ref 33–65)
CD4 T Cell Abs: 312 /uL — ABNORMAL LOW (ref 400–1790)

## 2022-05-13 LAB — CBC
HCT: 41.8 % (ref 38.5–50.0)
Hemoglobin: 13.7 g/dL (ref 13.2–17.1)
MCH: 27.8 pg (ref 27.0–33.0)
MCHC: 32.8 g/dL (ref 32.0–36.0)
MCV: 84.8 fL (ref 80.0–100.0)
MPV: 9.7 fL (ref 7.5–12.5)
Platelets: 293 10*3/uL (ref 140–400)
RBC: 4.93 Million/uL (ref 4.20–5.80)
RDW: 13.2 % (ref 11.0–15.0)
WBC: 5.5 10*3/uL (ref 3.8–10.8)

## 2022-05-13 LAB — COMPREHENSIVE METABOLIC PANEL WITH GFR
AG Ratio: 1.4 (calc) (ref 1.0–2.5)
ALT: 29 U/L (ref 9–46)
AST: 25 U/L (ref 10–35)
Albumin: 4.6 g/dL (ref 3.6–5.1)
Alkaline phosphatase (APISO): 73 U/L (ref 35–144)
BUN: 13 mg/dL (ref 7–25)
CO2: 25 mmol/L (ref 20–32)
Calcium: 9.5 mg/dL (ref 8.6–10.3)
Chloride: 106 mmol/L (ref 98–110)
Creat: 0.83 mg/dL (ref 0.70–1.30)
Globulin: 3.2 g/dL (ref 1.9–3.7)
Glucose, Bld: 102 mg/dL — ABNORMAL HIGH (ref 65–99)
Potassium: 4 mmol/L (ref 3.5–5.3)
Sodium: 141 mmol/L (ref 135–146)
Total Bilirubin: 0.4 mg/dL (ref 0.2–1.2)
Total Protein: 7.8 g/dL (ref 6.1–8.1)

## 2022-05-13 LAB — LIPID PANEL
Cholesterol: 208 mg/dL — ABNORMAL HIGH (ref <200)
HDL: 58 mg/dL (ref >=40)
LDL Cholesterol (Calc): 128 mg/dL (calc) — ABNORMAL HIGH
Non-HDL Cholesterol (Calc): 150 mg/dL (calc) — ABNORMAL HIGH (ref <130)
Total CHOL/HDL Ratio: 3.6 (calc) (ref <5.0)
Triglycerides: 114 mg/dL (ref <150)

## 2022-05-13 LAB — HIV-1 RNA QUANT-NO REFLEX-BLD
HIV 1 RNA Quant: <20 Copies/mL — ABNORMAL HIGH
HIV-1 RNA Quant, Log: <1.3 Log cps/mL — ABNORMAL HIGH

## 2022-05-13 LAB — RPR: RPR Ser Ql: NONREACTIVE

## 2022-05-17 ENCOUNTER — Encounter: Payer: Medicaid Other | Admitting: Student

## 2022-05-30 ENCOUNTER — Other Ambulatory Visit (HOSPITAL_COMMUNITY): Payer: Self-pay

## 2022-05-31 ENCOUNTER — Other Ambulatory Visit (HOSPITAL_COMMUNITY): Payer: Self-pay

## 2022-06-01 ENCOUNTER — Other Ambulatory Visit (HOSPITAL_COMMUNITY): Payer: Self-pay

## 2022-06-01 ENCOUNTER — Other Ambulatory Visit: Payer: Self-pay

## 2022-06-21 ENCOUNTER — Ambulatory Visit (INDEPENDENT_AMBULATORY_CARE_PROVIDER_SITE_OTHER): Payer: 59 | Admitting: Podiatry

## 2022-06-21 ENCOUNTER — Other Ambulatory Visit (HOSPITAL_COMMUNITY): Payer: Self-pay

## 2022-06-21 ENCOUNTER — Encounter: Payer: Self-pay | Admitting: Podiatry

## 2022-06-21 DIAGNOSIS — B351 Tinea unguium: Secondary | ICD-10-CM | POA: Diagnosis not present

## 2022-06-21 DIAGNOSIS — M79674 Pain in right toe(s): Secondary | ICD-10-CM

## 2022-06-21 DIAGNOSIS — M79675 Pain in left toe(s): Secondary | ICD-10-CM | POA: Diagnosis not present

## 2022-06-21 NOTE — Progress Notes (Signed)
This patient presents to the office with chief complaint of long thick painful nails.  Patient says the nails are painful walking and wearing shoes.  This patient is unable to self treat.  This patient is unable to trim his nails since she is unable to reach his nails.  She presents to the office for preventative foot care services.  General Appearance  Alert, conversant and in no acute stress.  Vascular  Dorsalis pedis and posterior tibial  pulses are palpable  bilaterally.  Capillary return is within normal limits  bilaterally. Temperature is within normal limits  bilaterally.  Neurologic  Senn-Weinstein monofilament wire test within normal limits  bilaterally. Muscle power within normal limits bilaterally.  Nails Thick disfigured discolored nails with subungual debris  from hallux to fifth toes bilaterally. No evidence of bacterial infection or drainage bilaterally.  Orthopedic  No limitations of motion  feet .  No crepitus or effusions noted.  No bony pathology or digital deformities noted.  Skin  normotropic skin with no porokeratosis noted bilaterally.  No signs of infections or ulcers noted.     Onychomycosis  Nails  B/L.  Pain in right toes  Pain in left toes  Debridement of nails both feet followed trimming the nails with dremel tool.    RTC 10 weeks    Rio Kidane DPM   

## 2022-06-23 ENCOUNTER — Other Ambulatory Visit (HOSPITAL_COMMUNITY): Payer: Self-pay

## 2022-06-27 ENCOUNTER — Other Ambulatory Visit (HOSPITAL_COMMUNITY): Payer: Self-pay

## 2022-06-30 ENCOUNTER — Other Ambulatory Visit (HOSPITAL_COMMUNITY): Payer: Self-pay

## 2022-07-12 ENCOUNTER — Encounter: Payer: Self-pay | Admitting: *Deleted

## 2022-07-21 ENCOUNTER — Other Ambulatory Visit (HOSPITAL_COMMUNITY): Payer: Self-pay

## 2022-07-25 ENCOUNTER — Other Ambulatory Visit (HOSPITAL_COMMUNITY): Payer: Self-pay

## 2022-07-26 ENCOUNTER — Other Ambulatory Visit (HOSPITAL_COMMUNITY): Payer: Self-pay

## 2022-08-22 ENCOUNTER — Other Ambulatory Visit (HOSPITAL_COMMUNITY): Payer: Self-pay

## 2022-08-24 ENCOUNTER — Other Ambulatory Visit (HOSPITAL_COMMUNITY): Payer: Self-pay

## 2022-08-24 ENCOUNTER — Encounter (HOSPITAL_COMMUNITY): Payer: Self-pay

## 2022-08-30 ENCOUNTER — Ambulatory Visit: Payer: 59 | Admitting: Podiatry

## 2022-09-01 ENCOUNTER — Ambulatory Visit (INDEPENDENT_AMBULATORY_CARE_PROVIDER_SITE_OTHER): Payer: 59 | Admitting: Student

## 2022-09-01 ENCOUNTER — Other Ambulatory Visit (HOSPITAL_COMMUNITY): Payer: Self-pay

## 2022-09-01 ENCOUNTER — Encounter: Payer: Self-pay | Admitting: Student

## 2022-09-01 ENCOUNTER — Other Ambulatory Visit: Payer: Self-pay

## 2022-09-01 VITALS — BP 128/94 | HR 76 | Wt 192.5 lb

## 2022-09-01 DIAGNOSIS — F17210 Nicotine dependence, cigarettes, uncomplicated: Secondary | ICD-10-CM

## 2022-09-01 DIAGNOSIS — I10 Essential (primary) hypertension: Secondary | ICD-10-CM | POA: Diagnosis not present

## 2022-09-01 DIAGNOSIS — F1020 Alcohol dependence, uncomplicated: Secondary | ICD-10-CM

## 2022-09-01 DIAGNOSIS — F1090 Alcohol use, unspecified, uncomplicated: Secondary | ICD-10-CM

## 2022-09-01 DIAGNOSIS — J302 Other seasonal allergic rhinitis: Secondary | ICD-10-CM | POA: Diagnosis not present

## 2022-09-01 DIAGNOSIS — E7800 Pure hypercholesterolemia, unspecified: Secondary | ICD-10-CM | POA: Insufficient documentation

## 2022-09-01 DIAGNOSIS — E785 Hyperlipidemia, unspecified: Secondary | ICD-10-CM

## 2022-09-01 DIAGNOSIS — K2100 Gastro-esophageal reflux disease with esophagitis, without bleeding: Secondary | ICD-10-CM

## 2022-09-01 DIAGNOSIS — Z720 Tobacco use: Secondary | ICD-10-CM

## 2022-09-01 MED ORDER — NICOTINE POLACRILEX 2 MG MT GUM
2.0000 mg | CHEWING_GUM | OROMUCOSAL | 0 refills | Status: DC | PRN
Start: 2022-09-01 — End: 2023-02-10
  Filled 2022-09-01 (×2): qty 110, 30d supply, fill #0

## 2022-09-01 MED ORDER — LORATADINE 10 MG PO TABS
10.0000 mg | ORAL_TABLET | Freq: Every day | ORAL | 6 refills | Status: DC
Start: 2022-09-01 — End: 2023-02-10
  Filled 2022-09-01: qty 30, 30d supply, fill #0
  Filled 2022-10-14: qty 30, 30d supply, fill #1
  Filled 2022-11-21: qty 30, 30d supply, fill #2
  Filled 2022-12-08 – 2022-12-14 (×2): qty 30, 30d supply, fill #3

## 2022-09-01 MED ORDER — HYDROCHLOROTHIAZIDE 12.5 MG PO CAPS
12.5000 mg | ORAL_CAPSULE | Freq: Every day | ORAL | 3 refills | Status: DC
Start: 2022-09-01 — End: 2022-09-01
  Filled 2022-09-01: qty 90, 90d supply, fill #0

## 2022-09-01 MED ORDER — ROSUVASTATIN CALCIUM 10 MG PO TABS
10.0000 mg | ORAL_TABLET | Freq: Every day | ORAL | 4 refills | Status: DC
Start: 2022-09-01 — End: 2023-07-14
  Filled 2022-09-01 (×2): qty 90, 90d supply, fill #0
  Filled 2022-11-21: qty 90, 90d supply, fill #1
  Filled 2023-02-21: qty 90, 90d supply, fill #2

## 2022-09-01 MED ORDER — HYDROCHLOROTHIAZIDE 25 MG PO TABS
25.0000 mg | ORAL_TABLET | Freq: Every day | ORAL | 3 refills | Status: DC
Start: 2022-09-01 — End: 2023-07-14
  Filled 2022-09-01 (×2): qty 90, 90d supply, fill #0
  Filled 2022-11-21: qty 90, 90d supply, fill #1
  Filled 2023-02-21: qty 90, 90d supply, fill #2

## 2022-09-01 MED ORDER — NALTREXONE HCL 50 MG PO TABS
25.0000 mg | ORAL_TABLET | Freq: Every day | ORAL | 3 refills | Status: DC
Start: 2022-09-01 — End: 2023-02-10
  Filled 2022-09-01 (×2): qty 45, 90d supply, fill #0
  Filled 2022-11-21: qty 45, 90d supply, fill #1

## 2022-09-01 NOTE — Patient Instructions (Signed)
William [name],  Thank you for coming in today. This after visit summary is an important review of tests, referrals, and medication changes that were discussed during your visit. If you have questions or concerns, call the clinic at [phone] between 9am - 4pm. Outside of clinic business hours, call the main hospital at [phone] and ask the operator for the on-call internal medicine resident.   How to use the gum: chew until you feel a "tingle" which is the nicotine releasing. Then stop chewing, pocket the gum. Then chew again when the tingle goes away.  Best, Dr. Katheran James

## 2022-09-01 NOTE — Assessment & Plan Note (Signed)
Pt currently smokes 2-3 cigarettes per day but he is interested in quitting and would like help. PLAN: Nicotine gum, instructions for use provided

## 2022-09-01 NOTE — Assessment & Plan Note (Signed)
History of ethanol abuse, he currently only drinks beer on weekends. PLAN: refill naltrexone 25 qd

## 2022-09-01 NOTE — Assessment & Plan Note (Signed)
Intermittent allergic symptoms but well controlled with current regimen PLAN: refill loratadine 10

## 2022-09-01 NOTE — Assessment & Plan Note (Signed)
BP uncontrolled over time. Often over 130/90. Asymptommatic without headaches, vision changes, lightheadedness. He did report feeling dizzy to his nurse today but orthostatics WNL and he stated it was very brief and has never happened before. Will increased hydrochlorothiazide from 12.5 to 25 today, return for BMP in 1 month. PLAN: hydrochlorothiazide 25 qd

## 2022-09-01 NOTE — Progress Notes (Addendum)
CC: Follow up  HPI:  William [name] is a 62 y.o. male living with a history stated below and presents today for follow up. Please see problem based assessment and plan for additional details.  Past Medical History:  Diagnosis Date   Allergy    Chronic hepatitis C without hepatic coma (HCC) 07/10/2006   His hepatitis C was treated and cured.      Folliculitis 06/02/2015   Grief 03/27/2019   HIV (human immunodeficiency virus infection) (HCC)    Hypertension    PNEUMOCYSTIS PNEUMONIA 07/10/2006   Qualifier: Diagnosis of  By: Philipp Deputy MD, Tresa Endo     Pneumothorax    Spontaneous pneumothorax 04/24/2013    Current Outpatient Medications on File Prior to Visit  Medication Sig Dispense Refill   bictegravir-emtricitabine-tenofovir AF (BIKTARVY) 50-200-25 MG TABS tablet Take 1 tablet by mouth daily. 30 tablet 11   pantoprazole (PROTONIX) 40 MG tablet Take 1 tablet (40 mg total) by mouth daily. 90 tablet 1   No current facility-administered medications on file prior to visit.    Family History  Adopted: Yes  Problem Relation Age of Onset   Heart attack Sister    Hypertension Maternal Aunt    Hypertension Other     Social History   Socioeconomic History   Marital status: Single    Spouse name: Not on file   Number of children: 0   Years of education: Not on file   Highest education level: 12th grade  Occupational History   Not on file  Tobacco Use   Smoking status: Every Day    Current packs/day: 0.10    Average packs/day: 0.1 packs/day for 0.1 years    Types: Cigarettes    Start date: 04/08/2018    Last attempt to quit: 04/09/2018   Smokeless tobacco: Never   Tobacco comments:    Using patches.  Vaping Use   Vaping status: Never Used  Substance and Sexual Activity   Alcohol use: Yes    Alcohol/week: 0.0 standard drinks of alcohol    Comment: daily   Drug use: Not Currently    Types: Cocaine   Sexual activity: Yes    Partners: Female    Comment: declined  condoms  Other Topics Concern   Not on file  Social History Narrative   Current Social History 08/17/2020        Patient lives with three roommates in an home which is 1 story There are 4 steps up to the entrance the patient uses. There is a handrail on each side      Patient's method of transportation is city bus.      The highest level of education was high school diploma.      The patient currently disabled.      Identified important Relationships are: mother       Pets : none       Interests / Fun: "go to the park and play basketball. Socialize"       Current Stressors: "I don't really have too much stress in my life"       Religious / Personal Beliefs: Baptist       Other: "I am just an outgoing person."       Social Determinants of Health   Financial Resource Strain: Low Risk  (03/15/2022)   Overall Financial Resource Strain (CARDIA)    Difficulty of Paying Living Expenses: Not very hard  Food Insecurity: No Food Insecurity (03/15/2022)   Hunger Vital Sign  Worried About Programme researcher, broadcasting/film/video in the Last Year: Never true    Ran Out of Food in the Last Year: Never true  Transportation Needs: No Transportation Needs (03/15/2022)   PRAPARE - Administrator, Civil Service (Medical): No    Lack of Transportation (Non-Medical): No  Physical Activity: Insufficiently Active (03/15/2022)   Exercise Vital Sign    Days of Exercise per Week: 2 days    Minutes of Exercise per Session: 30 min  Stress: No Stress Concern Present (08/17/2020)   [name]    Feeling of Stress : Not at all  Social Connections: Socially Isolated (03/15/2022)   Social Connection and Isolation Panel [NHANES]    Frequency of Communication with Friends and Family: Twice a week    Frequency of Social Gatherings with Friends and Family: Never    Attends Religious Services: Never    Database administrator or Organizations: No    Attends  Banker Meetings: Never    Marital Status: Widowed  Intimate Partner Violence: Not At Risk (03/15/2022)   Humiliation, Afraid, Rape, and Kick questionnaire    Fear of Current or Ex-Partner: No    Emotionally Abused: No    Physically Abused: No    Sexually Abused: No    Review of Systems: ROS negative except for what is noted on the assessment and plan.  Vitals:   09/01/22 0926  BP: (!) 128/94  Pulse: 76  SpO2: 97%  Weight: 192 lb 8 oz (87.3 kg)    Physical Exam: Constitutional: well-appearing man sitting in chair, in no acute distress HENT: normocephalic atraumatic, mucous membranes moist Eyes: conjunctiva non-erythematous Cardiovascular: regular rate and rhythm, no m/r/g Pulmonary/Chest: normal work of breathing on room air, lungs clear to auscultation bilaterally Abdominal: soft, non-tender, non-distended MSK: normal bulk and tone Neurological: alert & oriented x 3, no focal deficit Skin: warm and dry Psych: normal mood and behavior  Assessment & Plan:   Patient seen with Dr. Lafonda Mosses  Hypertension BP uncontrolled over time. Often over 130/90. Asymptommatic without headaches, vision changes, lightheadedness. He did report feeling dizzy to his nurse today but orthostatics WNL and he stated it was very brief and has never happened before. Will increased hydrochlorothiazide from 12.5 to 25 today, return for BMP in 1 month. PLAN: hydrochlorothiazide 25 qd  GERD with esophagitis Occasional reflux symptoms. PLAN: refill protonix 40  Seasonal allergies Intermittent allergic symptoms but well controlled with current regimen PLAN: refill loratadine 10  Cigarette smoker Pt currently smokes 2-3 cigarettes per day but he is interested in quitting and would like help. PLAN: Nicotine gum, instructions for use provided  Alcohol use disorder, moderate, dependence (HCC) History of ethanol abuse, he currently only drinks beer on weekends. PLAN: refill naltrexone 25  qd  Hyperlipidemia Cholesterol/LDL elevated per recent labs April 2024. We discussed diet and lifestyle modifcation today, but given ASCVD risk, will initiate statin for primary prevention. The 10-year ASCVD risk score (Arnett DK, et al., 2019) is: 21.4%   Values used to calculate the score:     Age: 45 years     Sex: Male     Is Non-Hispanic African American: Yes     Diabetic: No     Tobacco smoker: Yes     Systolic Blood Pressure: 128 mmHg     Is BP treated: Yes     HDL Cholesterol: 58 mg/dL     Total Cholesterol: 208 mg/dL  PLAN: Crestor 10, RTC 1 month BMP  Katheran James, D.O. [facility] Internal Medicine, PGY-1 Phone: [phone] Date 09/02/2022 Time 8:28 AM

## 2022-09-01 NOTE — Assessment & Plan Note (Signed)
Occasional reflux symptoms. PLAN: refill protonix 40

## 2022-09-02 ENCOUNTER — Encounter: Payer: Self-pay | Admitting: Student

## 2022-09-02 NOTE — Assessment & Plan Note (Signed)
Cholesterol/LDL elevated per recent labs April 2024. We discussed diet and lifestyle modifcation today, but given ASCVD risk, will initiate statin for primary prevention. The 10-year ASCVD risk score (Arnett DK, et al., 2019) is: 21.4%   Values used to calculate the score:     Age: 62 years     Sex: Male     Is Non-Hispanic African American: Yes     Diabetic: No     Tobacco smoker: Yes     Systolic Blood Pressure: 128 mmHg     Is BP treated: Yes     HDL Cholesterol: 58 mg/dL     Total Cholesterol: 208 mg/dL PLAN: Crestor 10, RTC 1 month BMP

## 2022-09-06 NOTE — Progress Notes (Signed)
Internal Medicine Clinic Attending  I was physically present during the key portions of the resident provided service and participated in the medical decision making of patient's management care. I reviewed pertinent patient test results.  The assessment, diagnosis, and plan were formulated together and I agree with the documentation in the resident's note.  Mercie Eon, MD

## 2022-09-07 ENCOUNTER — Ambulatory Visit: Payer: 59 | Admitting: Podiatry

## 2022-09-16 ENCOUNTER — Other Ambulatory Visit: Payer: Self-pay

## 2022-09-19 ENCOUNTER — Other Ambulatory Visit (HOSPITAL_COMMUNITY): Payer: Self-pay

## 2022-10-03 ENCOUNTER — Encounter: Payer: 59 | Admitting: Student

## 2022-10-11 ENCOUNTER — Other Ambulatory Visit (HOSPITAL_COMMUNITY): Payer: Self-pay

## 2022-10-14 ENCOUNTER — Other Ambulatory Visit (HOSPITAL_COMMUNITY): Payer: Self-pay

## 2022-10-14 ENCOUNTER — Other Ambulatory Visit: Payer: Self-pay | Admitting: Student

## 2022-10-14 DIAGNOSIS — K2100 Gastro-esophageal reflux disease with esophagitis, without bleeding: Secondary | ICD-10-CM

## 2022-10-14 MED ORDER — PANTOPRAZOLE SODIUM 40 MG PO TBEC
40.0000 mg | DELAYED_RELEASE_TABLET | Freq: Every day | ORAL | 1 refills | Status: DC
Start: 2022-10-14 — End: 2023-07-14
  Filled 2022-10-14: qty 90, 90d supply, fill #0
  Filled 2023-02-21: qty 90, 90d supply, fill #1

## 2022-10-17 ENCOUNTER — Other Ambulatory Visit (HOSPITAL_COMMUNITY): Payer: Self-pay

## 2022-10-24 ENCOUNTER — Other Ambulatory Visit: Payer: Self-pay

## 2022-10-24 DIAGNOSIS — B20 Human immunodeficiency virus [HIV] disease: Secondary | ICD-10-CM

## 2022-10-24 DIAGNOSIS — Z79899 Other long term (current) drug therapy: Secondary | ICD-10-CM

## 2022-10-24 DIAGNOSIS — Z113 Encounter for screening for infections with a predominantly sexual mode of transmission: Secondary | ICD-10-CM

## 2022-10-26 ENCOUNTER — Encounter: Payer: Self-pay | Admitting: Student

## 2022-10-26 ENCOUNTER — Encounter: Payer: 59 | Admitting: Internal Medicine

## 2022-10-26 ENCOUNTER — Other Ambulatory Visit: Payer: Self-pay

## 2022-10-26 ENCOUNTER — Ambulatory Visit (INDEPENDENT_AMBULATORY_CARE_PROVIDER_SITE_OTHER): Payer: 59 | Admitting: Student

## 2022-10-26 VITALS — BP 112/75 | HR 85 | Temp 97.7°F | Ht 71.0 in | Wt 186.7 lb

## 2022-10-26 DIAGNOSIS — Z87891 Personal history of nicotine dependence: Secondary | ICD-10-CM | POA: Diagnosis not present

## 2022-10-26 DIAGNOSIS — I10 Essential (primary) hypertension: Secondary | ICD-10-CM

## 2022-10-26 DIAGNOSIS — Z23 Encounter for immunization: Secondary | ICD-10-CM

## 2022-10-26 NOTE — Patient Instructions (Signed)
Thank you so much for coming to the clinic today!   Please do not take your blood pressure medication today or tomorrow.  I will give you a call tomorrow with the lab results, just to make sure that your kidneys are okay.  We will more than likely end up switching it to a different blood pressure medication which I think will help you out a lot more.  If you have any questions please feel free to the call the clinic at anytime at [phone]. It was a pleasure seeing you!  Best, Dr. Thomasene Ripple

## 2022-10-27 ENCOUNTER — Other Ambulatory Visit (HOSPITAL_COMMUNITY)
Admission: RE | Admit: 2022-10-27 | Discharge: 2022-10-27 | Disposition: A | Discharge status: Home / Self Care | Payer: 59 | Source: Ambulatory Visit | Attending: Internal Medicine | Admitting: Internal Medicine

## 2022-10-27 ENCOUNTER — Other Ambulatory Visit: Payer: 59

## 2022-10-27 ENCOUNTER — Other Ambulatory Visit: Payer: Self-pay

## 2022-10-27 DIAGNOSIS — B20 Human immunodeficiency virus [HIV] disease: Secondary | ICD-10-CM | POA: Insufficient documentation

## 2022-10-27 DIAGNOSIS — Z113 Encounter for screening for infections with a predominantly sexual mode of transmission: Secondary | ICD-10-CM

## 2022-10-27 LAB — BMP8+ANION GAP
Anion Gap: 18 mmol/L (ref 10.0–18.0)
BUN/Creatinine Ratio: 18 (ref 10–24)
BUN: 17 mg/dL (ref 8–27)
CO2: 23 mmol/L (ref 20–29)
Calcium: 9.3 mg/dL (ref 8.6–10.2)
Chloride: 102 mmol/L (ref 96–106)
Creatinine, Ser: 0.97 mg/dL (ref 0.76–1.27)
Glucose: 90 mg/dL (ref 70–99)
Potassium: 4.1 mmol/L (ref 3.5–5.2)
Sodium: 143 mmol/L (ref 134–144)
eGFR: 89 mL/min/{1.73_m2} (ref >59)

## 2022-10-27 NOTE — Assessment & Plan Note (Addendum)
Patient presents for follow-up for his blood pressure.  Blood pressure in the clinic initially 93/64, patient was then given 3 bottles of water and his blood pressure increased to 112/75.  He does endorse some intermittent dizziness, but he does work outside and states dehydrated as he does not drink enough water.  Informed patient to hold his hydrochlorothiazide for now, so we can check BMP to look for an AKI.  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  Attempted to call patient twice, however no pickup. He is aware to not take his blood pressure medication for the next couple days.  He also will obtain a blood pressure cuff.  Plan: - Plan to stop hydrochlorothiazide and switch to losartan

## 2022-10-27 NOTE — Progress Notes (Signed)
CC: Hypertension follow-up  HPI:  [name] is a 62 y.o. male living with a history stated below and presents today for hypertension follow-up. Please see problem based assessment and plan for additional details.  Past Medical History:  Diagnosis Date   Allergy    Chronic hepatitis C without hepatic coma (HCC) 07/10/2006   His hepatitis C was treated and cured.      Folliculitis 06/02/2015   Grief 03/27/2019   HIV (human immunodeficiency virus infection) (HCC)    Hypertension    PNEUMOCYSTIS PNEUMONIA 07/10/2006   Qualifier: Diagnosis of  By: Philipp Deputy MD, Tresa Endo     Pneumothorax    Spontaneous pneumothorax 04/24/2013    Current Outpatient Medications on File Prior to Visit  Medication Sig Dispense Refill   bictegravir-emtricitabine-tenofovir AF (BIKTARVY) 50-200-25 MG TABS tablet Take 1 tablet by mouth daily. 30 tablet 11   hydrochlorothiazide (HYDRODIURIL) 25 MG tablet Take 1 tablet (25 mg total) by mouth daily. 90 tablet 3   loratadine (CLARITIN) 10 MG tablet Take 1 tablet (10 mg total) by mouth daily. 30 tablet 6   naltrexone (DEPADE) 50 MG tablet Take 0.5 tablets (25 mg total) by mouth daily. 45 tablet 3   nicotine polacrilex (NICORETTE) 2 MG gum Take 1 each (2 mg total) by mouth as needed for smoking cessation. 110 tablet 0   pantoprazole (PROTONIX) 40 MG tablet Take 1 tablet (40 mg total) by mouth daily. 90 tablet 1   rosuvastatin (CRESTOR) 10 MG tablet Take 1 tablet (10 mg total) by mouth daily. 90 tablet 4   No current facility-administered medications on file prior to visit.    Family History  Adopted: Yes  Problem Relation Age of Onset   Heart attack Sister    Hypertension Maternal Aunt    Hypertension Other     Social History   Socioeconomic History   Marital status: Single    Spouse name: Not on file   Number of children: 0   Years of education: Not on file   Highest education level: 12th grade  Occupational History   Not on file  Tobacco  Use   Smoking status: Former    Current packs/day: 0.10    Average packs/day: 0.1 packs/day for 0.2 years    Types: Cigarettes    Start date: 04/08/2018    Quit date: 04/09/2018   Smokeless tobacco: Never   Tobacco comments:    Using patches. STOPPED SMOKING WHEN STARTED PATCHES.  Vaping Use   Vaping status: Never Used  Substance and Sexual Activity   Alcohol use: Yes    Alcohol/week: 0.0 standard drinks of alcohol    Comment: daily   Drug use: Not Currently    Types: Cocaine   Sexual activity: Yes    Partners: Female    Comment: declined condoms  Other Topics Concern   Not on file  Social History Narrative   Current Social History 08/17/2020        Patient lives with three roommates in an home which is 1 story There are 4 steps up to the entrance the patient uses. There is a handrail on each side      Patient's method of transportation is city bus.      The highest level of education was high school diploma.      The patient currently disabled.      Identified important Relationships are: mother       Pets : none  Interests / Fun: "go to the park and play basketball. Socialize"       Current Stressors: "I don't really have too much stress in my life"       Religious / Personal Beliefs: Baptist       Other: "I am just an outgoing person."       Social Determinants of Health   Financial Resource Strain: Low Risk  (03/15/2022)   Overall Financial Resource Strain (CARDIA)    Difficulty of Paying Living Expenses: Not very hard  Food Insecurity: No Food Insecurity (03/15/2022)   Hunger Vital Sign    Worried About Running Out of Food in the Last Year: Never true    Ran Out of Food in the Last Year: Never true  Transportation Needs: No Transportation Needs (03/15/2022)   PRAPARE - Administrator, Civil Service (Medical): No    Lack of Transportation (Non-Medical): No  Physical Activity: Insufficiently Active (03/15/2022)   Exercise Vital Sign    Days of  Exercise per Week: 2 days    Minutes of Exercise per Session: 30 min  Stress: No Stress Concern Present (08/17/2020)   [name]    Feeling of Stress : Not at all  Social Connections: Socially Isolated (03/15/2022)   Social Connection and Isolation Panel [NHANES]    Frequency of Communication with Friends and Family: Twice a week    Frequency of Social Gatherings with Friends and Family: Never    Attends Religious Services: Never    Database administrator or Organizations: No    Attends Banker Meetings: Never    Marital Status: Widowed  Intimate Partner Violence: Not At Risk (03/15/2022)   Humiliation, Afraid, Rape, and Kick questionnaire    Fear of Current or Ex-Partner: No    Emotionally Abused: No    Physically Abused: No    Sexually Abused: No    Review of Systems: ROS negative except for what is noted on the assessment and plan.  Vitals:   10/26/22 1520 10/26/22 1604  BP: 93/64 112/75  Pulse: 97 85  Temp: 97.7 F (36.5 C)   TempSrc: Oral   SpO2: 100%   Weight: 186 lb 11.2 oz (84.7 kg)   Height: 5\' 11"  (1.803 m)     Physical Exam: Constitutional: well-appearing male in no acute distress Cardiovascular: regular rate and rhythm, no m/r/g Pulmonary/Chest: normal work of breathing on room air, lungs clear to auscultation bilaterally Abdominal: soft, non-tender, non-distended   Assessment & Plan:   Hypertension Patient presents for follow-up for his blood pressure.  Blood pressure in the clinic initially 93/64, patient was then given 3 bottles of water and his blood pressure increased to 112/75.  He does endorse some intermittent dizziness, but he does work outside and states dehydrated as he does not drink enough water.  Informed patient to hold his hydrochlorothiazide for now, so we can check BMP to look for an AKI.  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  Attempted to call patient twice, however no pickup. He is aware to not take his blood pressure medication for the next couple days.  He also will obtain a blood pressure cuff.  Plan: - Plan to stop hydrochlorothiazide and switch to losartan  Patient discussed with Dr. Dewain Penning  Suella Cogar, M.D. [facility], PGY-2 Pager: [phone] Date 10/27/2022 Time 2:28 PM

## 2022-10-28 LAB — URINE CYTOLOGY ANCILLARY ONLY
Chlamydia: NEGATIVE
Comment: NEGATIVE
Comment: NORMAL
Neisseria Gonorrhea: NEGATIVE

## 2022-10-28 LAB — T-HELPER CELL (CD4) - (RCID CLINIC ONLY)
CD4 % Helper T Cell: 21 % — ABNORMAL LOW (ref 33–65)
CD4 T Cell Abs: 266 /uL — ABNORMAL LOW (ref 400–1790)

## 2022-10-29 LAB — COMPLETE METABOLIC PANEL WITH GFR
AG Ratio: 1.6 (calc) (ref 1.0–2.5)
ALT: 42 U/L (ref 9–46)
AST: 34 U/L (ref 10–35)
Albumin: 4.7 g/dL (ref 3.6–5.1)
Alkaline phosphatase (APISO): 70 U/L (ref 35–144)
BUN: 17 mg/dL (ref 7–25)
CO2: 25 mmol/L (ref 20–32)
Calcium: 9.4 mg/dL (ref 8.6–10.3)
Chloride: 103 mmol/L (ref 98–110)
Creat: 0.95 mg/dL (ref 0.70–1.35)
Globulin: 3 g/dL (calc) (ref 1.9–3.7)
Glucose, Bld: 112 mg/dL — ABNORMAL HIGH (ref 65–99)
Potassium: 3.9 mmol/L (ref 3.5–5.3)
Sodium: 138 mmol/L (ref 135–146)
Total Bilirubin: 0.5 mg/dL (ref 0.2–1.2)
Total Protein: 7.7 g/dL (ref 6.1–8.1)
eGFR: 92 mL/min/{1.73_m2} (ref >=60)

## 2022-10-29 LAB — CBC WITH DIFFERENTIAL/PLATELET
Absolute Monocytes: 647 cells/uL (ref 200–950)
Basophils Absolute: 73 cells/uL (ref 0–200)
Basophils Relative: 1.1 %
Eosinophils Absolute: 297 cells/uL (ref 15–500)
Eosinophils Relative: 4.5 %
HCT: 43.3 % (ref 38.5–50.0)
Hemoglobin: 13.8 g/dL (ref 13.2–17.1)
Lymphs Abs: 1353 cells/uL (ref 850–3900)
MCH: 28.2 pg (ref 27.0–33.0)
MCHC: 31.9 g/dL — ABNORMAL LOW (ref 32.0–36.0)
MCV: 88.5 fL (ref 80.0–100.0)
MPV: 9.6 fL (ref 7.5–12.5)
Monocytes Relative: 9.8 %
Neutro Abs: 4231 cells/uL (ref 1500–7800)
Neutrophils Relative %: 64.1 %
Platelets: 327 10*3/uL (ref 140–400)
RBC: 4.89 10*6/uL (ref 4.20–5.80)
RDW: 13.9 % (ref 11.0–15.0)
Total Lymphocyte: 20.5 %
WBC: 6.6 10*3/uL (ref 3.8–10.8)

## 2022-10-29 LAB — HIV-1 RNA QUANT-NO REFLEX-BLD
HIV 1 RNA Quant: NOT DETECTED Copies/mL
HIV-1 RNA Quant, Log: NOT DETECTED Log cps/mL

## 2022-10-29 LAB — RPR: RPR Ser Ql: NONREACTIVE

## 2022-11-01 NOTE — Progress Notes (Signed)
Internal Medicine Clinic Attending  Case discussed with the resident at the time of the visit.  We reviewed the resident's history and exam and pertinent patient test results.  I agree with the assessment, diagnosis, and plan of care documented in the resident's note.  

## 2022-11-04 ENCOUNTER — Other Ambulatory Visit: Payer: Self-pay

## 2022-11-09 ENCOUNTER — Other Ambulatory Visit (HOSPITAL_COMMUNITY): Payer: Self-pay

## 2022-11-10 ENCOUNTER — Ambulatory Visit: Payer: 59 | Admitting: Internal Medicine

## 2022-11-10 ENCOUNTER — Other Ambulatory Visit: Payer: Self-pay

## 2022-11-10 ENCOUNTER — Encounter: Payer: Self-pay | Admitting: Internal Medicine

## 2022-11-10 VITALS — BP 114/78 | HR 64 | Resp 16 | Ht 71.0 in | Wt 185.0 lb

## 2022-11-10 DIAGNOSIS — R21 Rash and other nonspecific skin eruption: Secondary | ICD-10-CM | POA: Diagnosis not present

## 2022-11-10 DIAGNOSIS — B20 Human immunodeficiency virus [HIV] disease: Secondary | ICD-10-CM | POA: Diagnosis not present

## 2022-11-10 MED ORDER — TRIAMCINOLONE ACETONIDE 0.5 % EX OINT
1.0000 | TOPICAL_OINTMENT | Freq: Two times a day (BID) | CUTANEOUS | 3 refills | Status: DC
  Filled 2022-11-10: qty 30, 15d supply, fill #0
  Filled 2022-11-21: qty 30, 15d supply, fill #1
  Filled 2022-12-08: qty 30, 15d supply, fill #2

## 2022-11-10 NOTE — Patient Instructions (Signed)
You are doing well   We'll see about transportation assistance and ryan white program    I'll see you in 9 months and we can do labs here after your visit; no need to do labs before hand    For the rash, try kenalog ointment twice a day for 14 days at a time and observe off and use as needed after that  If rash is worse let me or your primary care team know  Follow up with your primary team to get colon cancer screening/prostate cancer sreening

## 2022-11-10 NOTE — Progress Notes (Signed)
Patient Active Problem List   Diagnosis Date Noted   Hyperlipidemia 09/01/2022   Dysphagia 01/12/2021   Gastritis 01/12/2021   Screening for colon cancer 05/25/2020   Hypertension 03/27/2019   GERD with esophagitis 09/01/2016   Cigarette smoker 12/23/2014   Seasonal allergies 01/22/2013   Human immunodeficiency virus (HIV) disease (HCC) 07/10/2006   Alcohol use disorder, moderate, dependence (HCC) 07/10/2006    Patient's Medications  New Prescriptions   No medications on file  Previous Medications   BICTEGRAVIR-EMTRICITABINE-TENOFOVIR AF (BIKTARVY) 50-200-25 MG TABS TABLET    Take 1 tablet by mouth daily.   HYDROCHLOROTHIAZIDE (HYDRODIURIL) 25 MG TABLET    Take 1 tablet (25 mg total) by mouth daily.   LORATADINE (CLARITIN) 10 MG TABLET    Take 1 tablet (10 mg total) by mouth daily.   NALTREXONE (DEPADE) 50 MG TABLET    Take 0.5 tablets (25 mg total) by mouth daily.   NICOTINE POLACRILEX (NICORETTE) 2 MG GUM    Take 1 each (2 mg total) by mouth as needed for smoking cessation.   PANTOPRAZOLE (PROTONIX) 40 MG TABLET    Take 1 tablet (40 mg total) by mouth daily.   ROSUVASTATIN (CRESTOR) 10 MG TABLET    Take 1 tablet (10 mg total) by mouth daily.  Modified Medications   No medications on file  Discontinued Medications   No medications on file    Subjective: William [name] is in for his routine HIV follow-up visit.  He denies any problems obtaining, taking or tolerating his Biktarvy.  He takes it each morning and does not recall missing doses.  He is feeling well.  11/10/22 id clinic f/u Patient previously saw dr Orvan Falconer. First visit with me today See a&p for detail     Review of Systems: Review of Systems  Constitutional:  Negative for fever and weight loss.    Past Medical History:  Diagnosis Date   Allergy    Chronic hepatitis C without hepatic coma (HCC) 07/10/2006   His hepatitis C was treated and cured.      Folliculitis 06/02/2015   Grief 03/27/2019    HIV (human immunodeficiency virus infection) (HCC)    Hypertension    PNEUMOCYSTIS PNEUMONIA 07/10/2006   Qualifier: Diagnosis of  By: Philipp Deputy MD, Tresa Endo     Pneumothorax    Spontaneous pneumothorax 04/24/2013    Social History   Tobacco Use   Smoking status: Former    Current packs/day: 0.00    Average packs/day: 0.3 packs/day    Types: Cigarettes    Start date: 04/08/2018    Quit date: 04/09/2018    Years since quitting: 4.5    Passive exposure: Past   Smokeless tobacco: Never   Tobacco comments:    Using patches. STOPPED SMOKING WHEN STARTED PATCHES. Still chews Nicotene Gum. 11/10/2022  Vaping Use   Vaping status: Never Used  Substance Use Topics   Alcohol use: Yes    Alcohol/week: 0.0 standard drinks of alcohol    Comment: daily   Drug use: Not Currently    Types: Cocaine    Family History  Adopted: Yes  Problem Relation Age of Onset   Heart attack Sister    Hypertension Maternal Aunt    Hypertension Other     No Known Allergies  Health Maintenance  Topic Date Due   Zoster Vaccines- Shingrix (1 of 2) Never done   COVID-19 Vaccine (4 - 2023-24 season) 10/09/2022   Medicare Annual Wellness (  AWV)  03/16/2023   Colonoscopy  03/10/2032   DTaP/Tdap/Td (2 - Td or Tdap) 03/15/2032   INFLUENZA VACCINE  Completed   Hepatitis C Screening  Completed   HIV Screening  Completed   HPV VACCINES  Aged Out    Objective:  Vitals:   11/10/22 1021  BP: 114/78  Pulse: 64  Resp: 16  SpO2: 98%  Weight: 185 lb (83.9 kg)  Height: 5\' 11"  (1.803 m)   Body mass index is 25.8 kg/m.  Physical Exam Constitutional:      Comments: The spirits are good as usual.  Cardiovascular:     Rate and Rhythm: Normal rate and regular rhythm.     Heart sounds: No murmur heard. Pulmonary:     Effort: Pulmonary effort is normal.     Breath sounds: Normal breath sounds.  Psychiatric:        Mood and Affect: Mood normal.     Lab Results Lab Results  Component Value Date   WBC 6.6  10/27/2022   HGB 13.8 10/27/2022   HCT 43.3 10/27/2022   MCV 88.5 10/27/2022   PLT 327 10/27/2022    Lab Results  Component Value Date   CREATININE 0.95 10/27/2022   BUN 17 10/27/2022   NA 138 10/27/2022   K 3.9 10/27/2022   CL 103 10/27/2022   CO2 25 10/27/2022    Lab Results  Component Value Date   ALT 42 10/27/2022   AST 34 10/27/2022   ALKPHOS 69 11/04/2016   BILITOT 0.5 10/27/2022    Lab Results  Component Value Date   CHOL 208 (H) 05/11/2022   HDL 58 05/11/2022   LDLCALC 128 (H) 05/11/2022   TRIG 114 05/11/2022   CHOLHDL 3.6 05/11/2022   Lab Results  Component Value Date   LABRPR NON-REACTIVE 10/27/2022   RPRTITER 1:1 (H) 04/04/2017   HIV 1 RNA Quant (Copies/mL)  Date Value  10/27/2022 Not Detected  05/11/2022 <20 (H)  11/17/2021 <20 (H)   CD4 T Cell Abs (/uL)  Date Value  10/27/2022 266 (L)  05/11/2022 312 (L)  11/17/2021 290 (L)     Problem List Items Addressed This Visit   None Visit Diagnoses     HIV disease (HCC)    -  Primary   Rash          #hiv/aids #cad risk Dx'ed 2004; heterosexual risk Hx pjp pneumonia Therapy: Started treatment right away  2020-c biktarvy 2014-2020 genvoya 2010-2014 Truvada/isentress   Reviewed labs from 10/27/22 -- negative urine gc/chlam, rpr; hiv undetectable; cd4 266 (21% -- stable)   -continue crestor 10 mg daily -discussed u=u -encourage compliance -continue current HIV medication continue biktarvy -labs 9 months -f/u in 9 months    #hx hep c s/p treatment 2016 and resolved    #rash 2 months present Itchy bilateral arms medially (exam symmetric plaque) He hasn't tried anything outside of lotion   Appears to be eczema/dermatitis and not fungal or infectious   -trial of kenalog cream    #social -lives alone -parents and brother still alive (2 sisters had passed away) and live in [place] as well -retired/disability -using nicotine gum for stopping smoking, still does 1 or 2  cigarette a day -no other drug or alcohol -not sexually active/in relationship at this time    #reviewed other pmh Gerd Htn Tobacco use Hlp -- crestor 10 Hx of ?opioid dependence - naltrexone (primary care doctor does that) He does see the internal medicine clinic [facility]  for primary care    #hcm -vaccination Getting it with pcp Utd  -hepatitis Hx hep c 2011 hep b sAg negative 2007 hep b sAb positive -tb screening No epic record for quantiferon testing; will do next visit -cancer screening Defer to pcp for age appropriate cancer screening   Raymondo Band, MD [facility] [phone] pager   [phone] cell 11/10/2022, 10:41 AM

## 2022-11-11 ENCOUNTER — Other Ambulatory Visit (HOSPITAL_COMMUNITY): Payer: Self-pay

## 2022-11-14 ENCOUNTER — Other Ambulatory Visit: Payer: Self-pay

## 2022-11-21 ENCOUNTER — Other Ambulatory Visit (HOSPITAL_COMMUNITY): Payer: Self-pay

## 2022-11-21 ENCOUNTER — Other Ambulatory Visit: Payer: Self-pay

## 2022-11-21 NOTE — Progress Notes (Signed)
Specialty Pharmacy Refill Coordination Note  William [name] is a 62 y.o. male contacted today regarding refills of specialty medication(s) Bictegravir-Emtricitab-Tenofov   Patient requested Delivery   Delivery date: 11/23/22   Verified address: [address] [place] [address]   Medication will be filled on 11/22/22.

## 2022-11-22 ENCOUNTER — Other Ambulatory Visit: Payer: Self-pay

## 2022-12-08 ENCOUNTER — Other Ambulatory Visit: Payer: Self-pay

## 2022-12-08 ENCOUNTER — Other Ambulatory Visit (HOSPITAL_COMMUNITY): Payer: Self-pay

## 2022-12-08 NOTE — Progress Notes (Signed)
Specialty Pharmacy Refill Coordination Note  William [name] is a 62 y.o. male contacted today regarding refills of specialty medication(s) Bictegravir-Emtricitab-Tenofov   Patient requested Delivery   Delivery date: 12/15/22   Verified address: [address] [place] [address]   Medication will be filled on 12/14/22.

## 2022-12-14 ENCOUNTER — Other Ambulatory Visit: Payer: Self-pay

## 2022-12-14 ENCOUNTER — Other Ambulatory Visit (HOSPITAL_COMMUNITY): Payer: Self-pay

## 2022-12-14 NOTE — Progress Notes (Signed)
Insurance will pay for the next refill on 12/15/22. Will fill on 12/15/22 and the patient should receive next supply on 12/16/22. Attempted to call Home & Work number and was unable to reach patient. Could not leave voicemail.

## 2022-12-15 ENCOUNTER — Other Ambulatory Visit: Payer: Self-pay

## 2023-01-02 ENCOUNTER — Other Ambulatory Visit: Payer: Self-pay

## 2023-01-09 ENCOUNTER — Other Ambulatory Visit (HOSPITAL_COMMUNITY): Payer: Self-pay | Admitting: Pharmacy Technician

## 2023-01-09 ENCOUNTER — Other Ambulatory Visit (HOSPITAL_COMMUNITY): Payer: Self-pay

## 2023-01-09 NOTE — Progress Notes (Signed)
Specialty Pharmacy Refill Coordination Note  William [name] is a 61 y.o. male contacted today regarding refills of specialty medication(s) Bictegravir-Emtricitab-Tenofov  Spoke with Aunt  Patient requested Delivery   Delivery date: 01/13/23   Verified address: [address]  [name]   Medication will be filled on 01/12/23.

## 2023-01-12 ENCOUNTER — Other Ambulatory Visit: Payer: Self-pay

## 2023-01-27 ENCOUNTER — Encounter: Payer: Self-pay | Admitting: Podiatry

## 2023-01-27 ENCOUNTER — Ambulatory Visit (INDEPENDENT_AMBULATORY_CARE_PROVIDER_SITE_OTHER): Payer: 59 | Admitting: Podiatry

## 2023-01-27 DIAGNOSIS — B351 Tinea unguium: Secondary | ICD-10-CM | POA: Diagnosis not present

## 2023-01-27 DIAGNOSIS — M79675 Pain in left toe(s): Secondary | ICD-10-CM | POA: Diagnosis not present

## 2023-01-27 DIAGNOSIS — M79674 Pain in right toe(s): Secondary | ICD-10-CM

## 2023-01-27 DIAGNOSIS — B20 Human immunodeficiency virus [HIV] disease: Secondary | ICD-10-CM

## 2023-01-27 DIAGNOSIS — R21 Rash and other nonspecific skin eruption: Secondary | ICD-10-CM

## 2023-01-27 NOTE — Progress Notes (Addendum)
This patient presents to the office with chief complaint of long thick painful nails.  Patient says the nails are painful walking and wearing shoes.  This patient is unable to self treat.  This patient is unable to trim his nails since she is unable to reach his nails.  Patient says his skin lesions are beng treated and has improved since he started applying medicine to his feet.  He is under care of [facility]. He presents to the office for preventative foot care services.  General Appearance  Alert, conversant and in no acute stress.  Vascular  Dorsalis pedis and posterior tibial  pulses are palpable  bilaterally.  Capillary return is within normal limits  bilaterally. Temperature is within normal limits  bilaterally.  Neurologic  Senn-Weinstein monofilament wire test within normal limits  bilaterally. Muscle power within normal limits bilaterally.  Nails Thick disfigured discolored nails with subungual debris  from hallux to fifth toes bilaterally. No evidence of bacterial infection or drainage bilaterally.  Orthopedic  No limitations of motion  feet .  No crepitus or effusions noted.  No bony pathology or digital deformities noted.  Skin  normotropic skin with no porokeratosis noted bilaterally.  No signs of infections or ulcers noted.   Skin eruption legs and feet.   Onychomycosis  Nails  B/L.  Pain in right toes  Pain in left toes  Dermatitis legs and feet.  Debridement of nails both feet followed trimming the nails with dremel tool.   Soaked his foot in soapy sudsy  water.  RTC 10 weeks    Helane Gunther DPM

## 2023-02-05 ENCOUNTER — Emergency Department (HOSPITAL_COMMUNITY): Payer: 59

## 2023-02-05 ENCOUNTER — Inpatient Hospital Stay (HOSPITAL_COMMUNITY)
Admission: EM | Admit: 2023-02-05 | Discharge: 2023-02-10 | DRG: 603 | Disposition: A | Discharge status: Home / Self Care | Payer: 59 | Attending: Internal Medicine | Admitting: Internal Medicine

## 2023-02-05 ENCOUNTER — Encounter (HOSPITAL_COMMUNITY): Payer: Self-pay

## 2023-02-05 ENCOUNTER — Other Ambulatory Visit: Payer: Self-pay

## 2023-02-05 DIAGNOSIS — Z8249 Family history of ischemic heart disease and other diseases of the circulatory system: Secondary | ICD-10-CM | POA: Diagnosis not present

## 2023-02-05 DIAGNOSIS — E785 Hyperlipidemia, unspecified: Secondary | ICD-10-CM | POA: Diagnosis not present

## 2023-02-05 DIAGNOSIS — L97519 Non-pressure chronic ulcer of other part of right foot with unspecified severity: Secondary | ICD-10-CM | POA: Diagnosis present

## 2023-02-05 DIAGNOSIS — L03115 Cellulitis of right lower limb: Principal | ICD-10-CM | POA: Diagnosis present

## 2023-02-05 DIAGNOSIS — M7731 Calcaneal spur, right foot: Secondary | ICD-10-CM | POA: Diagnosis not present

## 2023-02-05 DIAGNOSIS — L309 Dermatitis, unspecified: Secondary | ICD-10-CM | POA: Diagnosis not present

## 2023-02-05 DIAGNOSIS — L97529 Non-pressure chronic ulcer of other part of left foot with unspecified severity: Secondary | ICD-10-CM | POA: Diagnosis not present

## 2023-02-05 DIAGNOSIS — I10 Essential (primary) hypertension: Secondary | ICD-10-CM | POA: Diagnosis present

## 2023-02-05 DIAGNOSIS — E876 Hypokalemia: Secondary | ICD-10-CM | POA: Diagnosis present

## 2023-02-05 DIAGNOSIS — B20 Human immunodeficiency virus [HIV] disease: Secondary | ICD-10-CM | POA: Diagnosis present

## 2023-02-05 DIAGNOSIS — F17210 Nicotine dependence, cigarettes, uncomplicated: Secondary | ICD-10-CM | POA: Diagnosis present

## 2023-02-05 DIAGNOSIS — M2011 Hallux valgus (acquired), right foot: Secondary | ICD-10-CM | POA: Diagnosis not present

## 2023-02-05 DIAGNOSIS — L089 Local infection of the skin and subcutaneous tissue, unspecified: Secondary | ICD-10-CM

## 2023-02-05 HISTORY — DX: Non-pressure chronic ulcer of other part of right foot with unspecified severity: L97.519

## 2023-02-05 LAB — CBC WITH DIFFERENTIAL/PLATELET
Abs Immature Granulocytes: 0.04 10*3/uL (ref 0.00–0.07)
Basophils Absolute: 0.1 10*3/uL (ref 0.0–0.1)
Basophils Relative: 1 %
Eosinophils Absolute: 0.1 10*3/uL (ref 0.0–0.5)
Eosinophils Relative: 1 %
HCT: 44.4 % (ref 39.0–52.0)
Hemoglobin: 14.4 g/dL (ref 13.0–17.0)
Immature Granulocytes: 1 %
Lymphocytes Relative: 17 %
Lymphs Abs: 1.5 10*3/uL (ref 0.7–4.0)
MCH: 28.3 pg (ref 26.0–34.0)
MCHC: 32.4 g/dL (ref 30.0–36.0)
MCV: 87.4 fL (ref 80.0–100.0)
Monocytes Absolute: 0.9 10*3/uL (ref 0.1–1.0)
Monocytes Relative: 11 %
Neutro Abs: 6.2 10*3/uL (ref 1.7–7.7)
Neutrophils Relative %: 69 %
Platelets: 322 10*3/uL (ref 150–400)
RBC: 5.08 MIL/uL (ref 4.22–5.81)
RDW: 13.8 % (ref 11.5–15.5)
WBC: 8.8 10*3/uL (ref 4.0–10.5)
nRBC: 0 % (ref 0.0–0.2)

## 2023-02-05 LAB — I-STAT CG4 LACTIC ACID, ED
Lactic Acid, Venous: 1 mmol/L (ref 0.5–1.9)
Lactic Acid, Venous: 1.1 mmol/L (ref 0.5–1.9)

## 2023-02-05 LAB — BASIC METABOLIC PANEL
Anion gap: 11 (ref 5–15)
BUN: 16 mg/dL (ref 6–20)
CO2: 23 mmol/L (ref 22–32)
Calcium: 8.8 mg/dL — ABNORMAL LOW (ref 8.9–10.3)
Chloride: 104 mmol/L (ref 98–111)
Creatinine, Ser: 1.04 mg/dL (ref 0.61–1.24)
GFR, Estimated: >60 mL/min (ref >60)
Glucose, Bld: 92 mg/dL (ref 70–99)
Potassium: 3.3 mmol/L — ABNORMAL LOW (ref 3.5–5.1)
Sodium: 138 mmol/L (ref 135–145)

## 2023-02-05 LAB — LACTIC ACID, PLASMA: Lactic Acid, Venous: 1.4 mmol/L (ref 0.5–1.9)

## 2023-02-05 LAB — MAGNESIUM: Magnesium: 1.8 mg/dL (ref 1.7–2.4)

## 2023-02-05 LAB — BRAIN NATRIURETIC PEPTIDE: B Natriuretic Peptide: 10.5 pg/mL (ref 0.0–100.0)

## 2023-02-05 MED ORDER — ACETAMINOPHEN 325 MG PO TABS: 650.0000 mg | ORAL_TABLET | Freq: Four times a day (QID) | ORAL | Status: DC | PRN

## 2023-02-05 MED ORDER — ACETAMINOPHEN 650 MG RE SUPP: 650.0000 mg | Freq: Four times a day (QID) | RECTAL | Status: DC | PRN

## 2023-02-05 MED ORDER — POTASSIUM CHLORIDE CRYS ER 20 MEQ PO TBCR
40.0000 meq | EXTENDED_RELEASE_TABLET | Freq: Once | ORAL | Status: AC
  Administered 2023-02-05: 40 meq via ORAL
  Filled 2023-02-05: qty 2

## 2023-02-05 MED ORDER — NICOTINE 7 MG/24HR TD PT24
7.0000 mg | MEDICATED_PATCH | Freq: Every day | TRANSDERMAL | Status: DC
Start: 2023-02-05 — End: 2023-02-10
  Administered 2023-02-05 – 2023-02-10 (×6): 7 mg via TRANSDERMAL
  Filled 2023-02-05 (×6): qty 1

## 2023-02-05 MED ORDER — ONDANSETRON HCL 4 MG/2ML IJ SOLN
4.0000 mg | Freq: Once | INTRAMUSCULAR | Status: AC
  Administered 2023-02-05: 4 mg via INTRAVENOUS
  Filled 2023-02-05: qty 2

## 2023-02-05 MED ORDER — BICTEGRAVIR-EMTRICITAB-TENOFOV 50-200-25 MG PO TABS
1.0000 | ORAL_TABLET | Freq: Every day | ORAL | Status: DC
  Administered 2023-02-05 – 2023-02-10 (×6): 1 via ORAL
  Filled 2023-02-05 (×6): qty 1

## 2023-02-05 MED ORDER — MORPHINE SULFATE (PF) 4 MG/ML IV SOLN
4.0000 mg | Freq: Once | INTRAVENOUS | Status: AC
  Administered 2023-02-05: 4 mg via INTRAVENOUS
  Filled 2023-02-05: qty 1

## 2023-02-05 MED ORDER — ROSUVASTATIN CALCIUM 5 MG PO TABS
10.0000 mg | ORAL_TABLET | Freq: Every day | ORAL | Status: DC
  Administered 2023-02-05 – 2023-02-10 (×6): 10 mg via ORAL
  Filled 2023-02-05 (×6): qty 2

## 2023-02-05 MED ORDER — ACETAMINOPHEN 500 MG PO TABS
1000.0000 mg | ORAL_TABLET | Freq: Three times a day (TID) | ORAL | Status: DC
Start: 2023-02-05 — End: 2023-02-10
  Administered 2023-02-05 – 2023-02-10 (×13): 1000 mg via ORAL
  Filled 2023-02-05 (×15): qty 2

## 2023-02-05 MED ORDER — SODIUM CHLORIDE 0.9 % IV BOLUS
500.0000 mL | Freq: Once | INTRAVENOUS | Status: AC
  Administered 2023-02-05: 500 mL via INTRAVENOUS

## 2023-02-05 MED ORDER — OXYCODONE HCL 5 MG PO TABS
5.0000 mg | ORAL_TABLET | ORAL | Status: DC | PRN
  Administered 2023-02-06: 5 mg via ORAL
  Filled 2023-02-05: qty 1

## 2023-02-05 MED ORDER — VANCOMYCIN HCL IN DEXTROSE 1-5 GM/200ML-% IV SOLN
1000.0000 mg | Freq: Two times a day (BID) | INTRAVENOUS | Status: DC
  Administered 2023-02-06 – 2023-02-09 (×7): 1000 mg via INTRAVENOUS
  Filled 2023-02-05 (×7): qty 200

## 2023-02-05 MED ORDER — SENNOSIDES-DOCUSATE SODIUM 8.6-50 MG PO TABS: 1.0000 | ORAL_TABLET | Freq: Every evening | ORAL | Status: DC | PRN

## 2023-02-05 MED ORDER — VANCOMYCIN HCL 1750 MG/350ML IV SOLN
1750.0000 mg | Freq: Once | INTRAVENOUS | Status: AC
  Administered 2023-02-05: 1750 mg via INTRAVENOUS
  Filled 2023-02-05: qty 350

## 2023-02-05 MED ORDER — ENOXAPARIN SODIUM 40 MG/0.4ML IJ SOSY
40.0000 mg | PREFILLED_SYRINGE | INTRAMUSCULAR | Status: DC
  Administered 2023-02-05 – 2023-02-09 (×5): 40 mg via SUBCUTANEOUS
  Filled 2023-02-05 (×5): qty 0.4

## 2023-02-05 NOTE — ED Triage Notes (Signed)
Pt. Stated, I started out with a little rash and scratching and its getting worse and worse. With pain.  Pt's rt. Foot , ankle and lower leg is red, skin peeling.

## 2023-02-05 NOTE — ED Notes (Signed)
ED TO INPATIENT HANDOFF REPORT  ED Nurse Name and Phone #: [phone]  S Name/Age/Gender William [name] 62 y.o. male Room/Bed: 002C/002C  Code Status   Code Status: Full Code  Home/SNF/Other Home Patient oriented to: self, place, time, and situation Is this baseline? Yes   Triage Complete: Triage complete  Chief Complaint Right foot infection [L08.9]  Triage Note Pt. Stated, I started out with a little rash and scratching and its getting worse and worse. With pain.  Pt's rt. Foot , ankle and lower leg is red, skin peeling.    Allergies No Known Allergies  Level of Care/Admitting Diagnosis ED Disposition     ED Disposition  Admit   Condition  --   Comment  Hospital Area: [facility] [[id number]]  Level of Care: Med-Surg [16]  May place patient in observation at [facility] or [facility] if equivalent level of care is available:: No  Covid Evaluation: Asymptomatic - no recent exposure (last 10 days) testing not required  Diagnosis: Right foot infection [147829]  Admitting Physician: Miguel Aschoff [[id number]]  Attending Physician: Miguel Aschoff [[id number]]          B Medical/Surgery History Past Medical History:  Diagnosis Date   Allergy    Chronic hepatitis C without hepatic coma (HCC) 07/10/2006   His hepatitis C was treated and cured.      Folliculitis 06/02/2015   Grief 03/27/2019   HIV (human immunodeficiency virus infection) (HCC)    Hypertension    PNEUMOCYSTIS PNEUMONIA 07/10/2006   Qualifier: Diagnosis of  By: Philipp Deputy MD, Tresa Endo     Pneumothorax    Spontaneous pneumothorax 04/24/2013   Past Surgical History:  Procedure Laterality Date   CHEST TUBE INSERTION     ESOPHAGOGASTRODUODENOSCOPY (EGD) WITH PROPOFOL N/A 08/29/2016   Procedure: ESOPHAGOGASTRODUODENOSCOPY (EGD) WITH PROPOFOL;  Surgeon: Kathi Der, MD;  Location: [facility];  Service: Gastroenterology;  Laterality: N/A;   MULTIPLE EXTRACTIONS WITH  ALVEOLOPLASTY N/A 05/23/2014   Procedure: EXTRACTIONS OF ALL REMAINING TEETH WITH ALVEOLOPLASTY, REMOVAL OF LEFT OSSEOUS EXOTOSIS, REMOVAL MAXILLARY OSSEOUS TUBEROSITY;  Surgeon: Ocie Doyne, DDS;  Location: [facility] OR;  Service: Oral Surgery;  Laterality: N/A;   PLEURADESIS Left 04/26/2013   Procedure: PLEURADESIS;  Surgeon: Alleen Borne, MD;  Location: [facility] OR;  Service: Thoracic;  Laterality: Left;  Mechanical   STAPLING OF BLEBS Left 04/26/2013   Procedure: STAPLING OF BLEBS;  Surgeon: Alleen Borne, MD;  Location: [facility] OR;  Service: Thoracic;  Laterality: Left;   VIDEO ASSISTED THORACOSCOPY Left 04/26/2013   Procedure: VIDEO ASSISTED THORACOSCOPY;  Surgeon: Alleen Borne, MD;  Location: [facility] OR;  Service: Thoracic;  Laterality: Left;     A IV Location/Drains/Wounds Patient Lines/Drains/Airways Status     Active Line/Drains/Airways     Name Placement date Placement time Site Days   Peripheral IV 02/05/23 20 G 1" Anterior;Right Forearm 02/05/23  1232  Forearm  less than 1            Intake/Output Last 24 hours No intake or output data in the 24 hours ending 02/05/23 1710  Labs/Imaging Results for orders placed or performed during the hospital encounter of 02/05/23 (from the past 48 hours)  CBC with Differential     Status: None   Collection Time: 02/05/23  9:57 AM  Result Value Ref Range   WBC 8.8 4.0 - 10.5 K/uL   RBC 5.08 4.22 - 5.81 MIL/uL   Hemoglobin 14.4 13.0 - 17.0 g/dL  HCT 44.4 39.0 - 52.0 %   MCV 87.4 80.0 - 100.0 fL   MCH 28.3 26.0 - 34.0 pg   MCHC 32.4 30.0 - 36.0 g/dL   RDW 96.0 45.4 - 09.8 %   Platelets 322 150 - 400 K/uL   nRBC 0.0 0.0 - 0.2 %   Neutrophils Relative % 69 %   Neutro Abs 6.2 1.7 - 7.7 K/uL   Lymphocytes Relative 17 %   Lymphs Abs 1.5 0.7 - 4.0 K/uL   Monocytes Relative 11 %   Monocytes Absolute 0.9 0.1 - 1.0 K/uL   Eosinophils Relative 1 %   Eosinophils Absolute 0.1 0.0 - 0.5 K/uL   Basophils Relative 1 %   Basophils Absolute 0.1 0.0 - 0.1  K/uL   Immature Granulocytes 1 %   Abs Immature Granulocytes 0.04 0.00 - 0.07 K/uL    Comment: Performed at [facility], [address] [place], [place] [address]  Brain natriuretic peptide     Status: None   Collection Time: 02/05/23  9:57 AM  Result Value Ref Range   B Natriuretic Peptide 10.5 0.0 - 100.0 pg/mL    Comment: Performed at [facility], [address] [place], [place] [address]  Lactic acid, plasma     Status: None   Collection Time: 02/05/23  9:57 AM  Result Value Ref Range   Lactic Acid, Venous 1.4 0.5 - 1.9 mmol/L    Comment: Performed at [facility], [address] [place], [place] [address]  I-Stat Lactic Acid     Status: None   Collection Time: 02/05/23 12:35 PM  Result Value Ref Range   Lactic Acid, Venous 1.0 0.5 - 1.9 mmol/L  Basic metabolic panel     Status: Abnormal   Collection Time: 02/05/23  2:04 PM  Result Value Ref Range   Sodium 138 135 - 145 mmol/L   Potassium 3.3 (L) 3.5 - 5.1 mmol/L   Chloride 104 98 - 111 mmol/L   CO2 23 22 - 32 mmol/L   Glucose, Bld 92 70 - 99 mg/dL    Comment: Glucose reference range applies only to samples taken after fasting for at least 8 hours.   BUN 16 6 - 20 mg/dL   Creatinine, Ser 8.65 0.61 - 1.24 mg/dL   Calcium 8.8 (L) 8.9 - 10.3 mg/dL   GFR, Estimated >78 >46 mL/min    Comment: (NOTE) Calculated using the CKD-EPI Creatinine Equation (2021)    Anion gap 11 5 - 15    Comment: Performed at [facility], [address] [place], [place] [address]  Magnesium     Status: None   Collection Time: 02/05/23  2:04 PM  Result Value Ref Range   Magnesium 1.8 1.7 - 2.4 mg/dL    Comment: Performed at [facility], [address] [place], [place] [address]  I-Stat Lactic Acid     Status: None   Collection Time: 02/05/23  2:12 PM  Result Value Ref Range   Lactic Acid, Venous 1.1 0.5 - 1.9 mmol/L   DG Foot Complete Right Result Date: 02/05/2023 CLINICAL DATA:  Wound EXAM: RIGHT FOOT  COMPLETE - 3 VIEW COMPARISON:  None Available. FINDINGS: There is no evidence of fracture or dislocation. There is hallux valgus. Plantar calcaneal spur. No osteolytic or osteoblastic changes. No soft tissue abnormalities. IMPRESSION: No acute osseous abnormalities. Electronically Signed   By: Layla Maw M.D.   On: 02/05/2023 13:17  Pending Labs Unresulted Labs (From admission, onward)     Start     Ordered   02/06/23 0500  Basic metabolic panel  Tomorrow morning,   R        02/05/23 1520   02/06/23 0500  CBC  Tomorrow morning,   R        02/05/23 1604   02/05/23 1216  Culture, blood (routine x 2)  BLOOD CULTURE X 2,   R (with STAT occurrences)      02/05/23 1215            Vitals/Pain Today's Vitals   02/05/23 1249 02/05/23 1347 02/05/23 1403 02/05/23 1407  BP: (!) 153/118  (!) 140/98   Pulse: (!) 114  (!) 104   Resp: (!) 30  20   Temp:    98.9 F (37.2 C)  TempSrc:    Oral  SpO2: 93%  91%   PainSc:  8       Isolation Precautions No active isolations  Medications Medications  vancomycin (VANCOCIN) IVPB 1000 mg/200 mL premix (has no administration in time range)  enoxaparin (LOVENOX) injection 40 mg (has no administration in time range)  acetaminophen (TYLENOL) tablet 650 mg (has no administration in time range)    Or  acetaminophen (TYLENOL) suppository 650 mg (has no administration in time range)  senna-docusate (Senokot-S) tablet 1 tablet (has no administration in time range)  bictegravir-emtricitabine-tenofovir AF (BIKTARVY) 50-200-25 MG per tablet 1 tablet (has no administration in time range)  rosuvastatin (CRESTOR) tablet 10 mg (has no administration in time range)  nicotine (NICODERM CQ - dosed in mg/24 hr) patch 7 mg (has no administration in time range)  sodium chloride 0.9 % bolus 500 mL (0 mLs Intravenous Stopped 02/05/23 1347)  morphine (PF) 4 MG/ML injection 4 mg (4 mg Intravenous Given 02/05/23 1250)  ondansetron (ZOFRAN) injection 4 mg (4 mg  Intravenous Given 02/05/23 1250)  vancomycin (VANCOREADY) IVPB 1750 mg/350 mL (0 mg Intravenous Stopped 02/05/23 1641)    Mobility walks     Focused Assessments    R Recommendations: See Admitting Provider Note  Report given to:   Additional Notes:

## 2023-02-05 NOTE — ED Provider Notes (Signed)
Emergency Department Provider Note   I have reviewed the triage vital signs and the nursing notes.   HISTORY  Chief Complaint Recurrent Skin Infections, leg infection, and ankle infection   HPI William [name] is a 62 y.o. male with HIV presents to the emergency department with right foot and ankle wounds.  He states that he has had issues in this area in the past but things seem to be getting better.  He states in the past 24 to 48 hours he is developed open, oozing/bleeding wounds.  He put a sock on this morning and it has now dried and crusted to his foot causing pain with trying to remove it.  He reports subjective fevers and chills at home.  No injury.   Past Medical History:  Diagnosis Date   Allergy    Chronic hepatitis C without hepatic coma (HCC) 07/10/2006   His hepatitis C was treated and cured.      Folliculitis 06/02/2015   Grief 03/27/2019   HIV (human immunodeficiency virus infection) (HCC)    Hypertension    PNEUMOCYSTIS PNEUMONIA 07/10/2006   Qualifier: Diagnosis of  By: Philipp Deputy MD, Tresa Endo     Pneumothorax    Spontaneous pneumothorax 04/24/2013    Review of Systems  Constitutional: Positive fever/chills Cardiovascular: Denies chest pain. Respiratory: Denies shortness of breath. Gastrointestinal: No abdominal pain.  No nausea, no vomiting.  No diarrhea.   Musculoskeletal: Negative for back pain. Skin: Positive foot wound.  Neurological: Negative for headaches, focal weakness or numbness.  ____________________________________________   PHYSICAL EXAM:  VITAL SIGNS: ED Triage Vitals  Encounter Vitals Group     BP 02/05/23 0936 112/84     Pulse Rate 02/05/23 0936 (!) 110     Resp 02/05/23 0936 18     Temp 02/05/23 0936 97.8 F (36.6 C)     Temp src --      SpO2 02/05/23 0936 97 %   Constitutional: Alert and oriented. Well appearing and in no acute distress. Eyes: Conjunctivae are normal.  Head: Atraumatic. Nose: No  congestion/rhinnorhea. Mouth/Throat: Mucous membranes are moist.  Neck: No stridor.  Cardiovascular: Normal rate, regular rhythm. Good peripheral circulation. Grossly normal heart sounds.   Respiratory: Normal respiratory effort.  No retractions. Lungs CTAB. Gastrointestinal: Soft and nontender. No distention.  Musculoskeletal: No gross deformities of extremities. Neurologic:  Normal speech and language. No gross focal neurologic deficits are appreciated.  Skin:  Right foot pictured below. Multiple oozing lesions to the foot and ankle.    ____________________________________________   LABS (all labs ordered are listed, but only abnormal results are displayed)  Labs Reviewed  BASIC METABOLIC PANEL - Abnormal; Notable for the following components:      Result Value   Potassium 3.3 (*)    Calcium 8.8 (*)    All other components within normal limits  CULTURE, BLOOD (ROUTINE X 2)  CULTURE, BLOOD (ROUTINE X 2)  CBC WITH DIFFERENTIAL/PLATELET  BRAIN NATRIURETIC PEPTIDE  LACTIC ACID, PLASMA  I-STAT CG4 LACTIC ACID, ED  I-STAT CG4 LACTIC ACID, ED   ____________________________________________  RADIOLOGY  DG Foot Complete Right Result Date: 02/05/2023 CLINICAL DATA:  Wound EXAM: RIGHT FOOT COMPLETE - 3 VIEW COMPARISON:  None Available. FINDINGS: There is no evidence of fracture or dislocation. There is hallux valgus. Plantar calcaneal spur. No osteolytic or osteoblastic changes. No soft tissue abnormalities. IMPRESSION: No acute osseous abnormalities. Electronically Signed   By: William [name] M.D.   On: 02/05/2023 13:17  ____________________________________________   PROCEDURES  Procedure(s) performed:   Procedures  None  ____________________________________________   INITIAL IMPRESSION / ASSESSMENT AND PLAN / ED COURSE  Pertinent labs & imaging results that were available during my care of the patient were reviewed by me and considered in my medical decision making  (see chart for details).   This patient is Presenting for Evaluation of foot wound, which does require a range of treatment options, and is a complaint that involves a high risk of morbidity and mortality.  The Differential Diagnoses include this, osteomyelitis, fasciitis, sepsis, limb ischemia, etc.  Critical Interventions-    Medications  vancomycin (VANCOREADY) IVPB 1750 mg/350 mL (1,750 mg Intravenous New Bag/Given 02/05/23 1406)  sodium chloride 0.9 % bolus 500 mL (0 mLs Intravenous Stopped 02/05/23 1347)  morphine (PF) 4 MG/ML injection 4 mg (4 mg Intravenous Given 02/05/23 1250)  ondansetron (ZOFRAN) injection 4 mg (4 mg Intravenous Given 02/05/23 1250)    Reassessment after intervention: pain symptoms improved.   I decided to review pertinent External Data, and in summary CD4 count from 10/2022 in the 260s.   Clinical Laboratory Tests Ordered, included CBC without leukocytosis.  No acute kidney injury.  Lactic acid normal x 2.  Radiologic Tests Ordered, included foot XR. I independently interpreted the images and agree with radiology interpretation.   Cardiac Monitor Tracing which shows sinus tachycardia.   Consult complete with IM teaching service. They will consult for admission.   Medical Decision Making: Summary:  Patient presents to the emergency department with a foot wound.  Patient with HIV and in the past has had poor compliance with medications.  Reported fevers at home and SIRS vitals here.  Have ordered antibiotics, fluids, cultures.  Doubt sepsis with improved heart rate with minimal fluid bolus. Will need abx and admit for wound care.    Patient's presentation is most consistent with acute presentation with potential threat to life or bodily function.   Disposition: admit  ____________________________________________  FINAL CLINICAL IMPRESSION(S) / ED DIAGNOSES  Final diagnoses:  Cellulitis of right lower extremity    Note:  This document was prepared  using Dragon voice recognition software and may include unintentional dictation errors.  Alona Bene, MD, [facility] Emergency Medicine    Vraj Denardo, Arlyss Repress, MD 02/05/23 [phone]

## 2023-02-05 NOTE — Hospital Course (Signed)
-  HIV -Right LE and foot infection -no sepsis -no WBC, lactic normal  -tachycardic -got IVF, abx and pain meds  In September  -CD4 266 -HIV viral load not detected

## 2023-02-05 NOTE — Consult Note (Signed)
WOC Nurse Consult Note: Reason for Consult: foot and ankle wounds History of HIV and skin lesions  Wound type: partial thickness ulcerations over the right dorsal foot and malleolar area  Pressure Injury POA: NA Measurement: see nursing flow sheets Wound bed: pink Drainage (amount, consistency, odor) bloody Periwound: intact  Dressing procedure/placement/frequency: Cleanse foot wound/ankle wounds with Vashe [place] @ [phone]), pat dry. Apply single layer of xeroform gauze, top with dry dressing, secure with kerlix    Re consult if needed, will not follow at this time. Thanks  Kendale [employer]bold [employer], RN,CWOCN, CNS, CWON-AP [phone])

## 2023-02-05 NOTE — Progress Notes (Signed)
Pharmacy Antibiotic Note  William [name] is a 62 y.o. male admitted on 02/05/2023 with cellulitis.  Pharmacy has been consulted for vancomycin dosing.  84.7kg from 11/10/22 Scr 1.04   Plan: Vancomycin 1750mg  IV x 1 followed by vancomycin 1000 q12h -vanc levels PRN per protocol -goal trough 15-25mcg/ml, Auc 400-600 F/u cultlures, LOT, renal func, levels    Temp (24hrs), Avg:97.8 F (36.6 C), Min:97.8 F (36.6 C), Max:97.8 F (36.6 C)  Recent Labs  Lab 02/05/23 0957 02/05/23 1235  WBC 8.8  --   LATICACIDVEN 1.4 1.0    CrCl cannot be calculated (Patient's most recent lab result is older than the maximum 21 days allowed.).    No Known Allergies  Antimicrobials this admission: Vanc 12/29>   Dose adjustments this admission:  Microbiology results: 12/29 [facility]   Thank you for allowing pharmacy to be a part of this patients care.  Calton Dach, PharmD, BCCCP Clinical Pharmacist 02/05/2023 1:46 PM

## 2023-02-05 NOTE — H&P (Signed)
Date: 02/05/2023               Patient Name:  William [name] MRN: [id number]  DOB: June 03, 1960 Age / Sex: 62 y.o., male   PCP: Katheran James, DO         Medical Service: Internal Medicine Teaching Service         Attending Physician: Dr. Mayford Knife, Dorene Ar, MD      First Contact: Dr. Katheran James, DO Pager [phone]    Second Contact: Dr. Rana Snare, DO Pager [phone]         After Hours (After 5p/  First Contact Pager: [phone]  weekends / holidays): Second Contact Pager: [phone]   SUBJECTIVE   Chief Complaint: R foot wound  History of Present Illness:  Mr. William [name] is [age] year old man with a past medical history of HIV, HLD, HTN who presents with a right foot wound.  Patient presents for acute right lower leg pain and wound that he says started about 2 days ago.  He notes a 1 month history of an itchy rash at the same site he would frequently and aggressively scratch the rash.  Although the itchiness is gone away, he notes that about 3 days ago he developed severe pain.  Pain was so severe that he has remained in bed for the last 3 days, as walking is too difficult.  He is not entirely sure when, but at some point in that time, he has developed open wounds on the right foot.  Pain became so severe that he walked and took the bus to the ER today.  He denies any trauma.  He is no longer itching as the itchiness is gone away.  He denies fevers and chills.  He denies any pain in the left foot, although there is a rash without skin breakdown on that foot as well.  Not self treated with any medicines.  He denies any known insect or animal bites.  Denies a history of diabetes  Surrogate decision maker: mother [place]   ED Course: Initial vitals revealed tachycardia 110, otherwise stable vitals, afebrile.  Initial examination revealed multiple oozing lesions on the right foot and ankle.  Potassium is low at 3.3 but other labs are within normal limits, there  was no leukocytosis or lactic acidosis.  Blood cultures drawn in setting of wound and tachycardia.  Foot x-ray did not suggest osteomyelitis.  Pain was treated with morphine.  Vancomycin was initiated.  [facility] was paged for admission for cellulitis and wound care.  Past Medical History Past Medical History:  Diagnosis Date   Allergy    Chronic hepatitis C without hepatic coma (HCC) 07/10/2006   His hepatitis C was treated and cured.      Folliculitis 06/02/2015   Grief 03/27/2019   HIV (human immunodeficiency virus infection) (HCC)    Hypertension    PNEUMOCYSTIS PNEUMONIA 07/10/2006   Qualifier: Diagnosis of  By: Philipp Deputy MD, Tresa Endo     Pneumothorax    Spontaneous pneumothorax 04/24/2013     Meds:  -Biktarvy daily  -hydrochlorothiazide 25 mg daily -Naltrexone 25 mg daily -Protonix 40 mg daily (no longer taking) -Crestor 10 mg daily (no longer taking)  Past Surgical History Past Surgical History:  Procedure Laterality Date   CHEST TUBE INSERTION     ESOPHAGOGASTRODUODENOSCOPY (EGD) WITH PROPOFOL N/A 08/29/2016   Procedure: ESOPHAGOGASTRODUODENOSCOPY (EGD) WITH PROPOFOL;  Surgeon: Kathi Der, MD;  Location: [facility];  Service: Gastroenterology;  Laterality: N/A;   MULTIPLE  EXTRACTIONS WITH ALVEOLOPLASTY N/A 05/23/2014   Procedure: EXTRACTIONS OF ALL REMAINING TEETH WITH ALVEOLOPLASTY, REMOVAL OF LEFT OSSEOUS EXOTOSIS, REMOVAL MAXILLARY OSSEOUS TUBEROSITY;  Surgeon: Ocie Doyne, DDS;  Location: [facility] OR;  Service: Oral Surgery;  Laterality: N/A;   PLEURADESIS Left 04/26/2013   Procedure: PLEURADESIS;  Surgeon: Alleen Borne, MD;  Location: [facility] OR;  Service: Thoracic;  Laterality: Left;  Mechanical   STAPLING OF BLEBS Left 04/26/2013   Procedure: STAPLING OF BLEBS;  Surgeon: Alleen Borne, MD;  Location: [facility] OR;  Service: Thoracic;  Laterality: Left;   VIDEO ASSISTED THORACOSCOPY Left 04/26/2013   Procedure: VIDEO ASSISTED THORACOSCOPY;  Surgeon: Alleen Borne, MD;  Location: [facility]  OR;  Service: Thoracic;  Laterality: Left;    Social:  Lives with 3 roommates Occupation: Custodian Support: family in area Level of Function: independent with ADL and IADL  PCP: Katheran James, DO Substances: -Tobacco: 2-3 cigarettes daily (up to 1ppd before) x 40-50 years -Alcohol: 6 pack beer, wine (last drink was Christmas 12/25) -Recreational Drug: denies   Family History:  Family History  Adopted: Yes  Problem Relation Age of Onset   Heart attack Sister    Hypertension Maternal Aunt    Hypertension Other     Allergies: Allergies as of 02/05/2023   (No Known Allergies)    Review of Systems: A complete ROS was negative except as per HPI.   OBJECTIVE:   Physical Exam: Blood pressure (!) 140/98, pulse (!) 104, temperature 98.9 F (37.2 C), temperature source Oral, resp. rate 20, SpO2 91%.  Constitutional: Chronically ill-appearing. In no acute distress. HENT: Normocephalic, atraumatic,  Eyes: Sclera non-icteric, PERRL, EOM intact Neck:normal atraumatic, no neck masses, normal thyroid, no jvd Cardio:Regular rate and rhythm. No murmurs, rubs, or gallops. 2+ bilateral radial and dorsalis pedis  pulses. Pulm:Clear to auscultation bilaterally. Normal work of breathing on room air. Abdomen: Soft, non-tender, non-distended, positive bowel sounds. MSK: There is diffuse unification of neurodermatitis on the right foot with multiple shallow open wounds on the R foot, see picture. There is a smaller area of similar rash without wonds on the dorsum of the L foot. Skin:Warm and dry. Neuro:Alert and oriented x3. No focal deficit noted. Psych:Pleasant mood and affect.   Labs: CBC    Component Value Date/Time   WBC 8.8 02/05/2023 0957   RBC 5.08 02/05/2023 0957   HGB 14.4 02/05/2023 0957   HCT 44.4 02/05/2023 0957   PLT 322 02/05/2023 0957   MCV 87.4 02/05/2023 0957   MCH 28.3 02/05/2023 0957   MCHC 32.4 02/05/2023 0957   RDW 13.8 02/05/2023 0957   LYMPHSABS 1.5  02/05/2023 0957   MONOABS 0.9 02/05/2023 0957   EOSABS 0.1 02/05/2023 0957   BASOSABS 0.1 02/05/2023 0957     CMP     Component Value Date/Time   NA 138 02/05/2023 1404   NA 143 10/26/2022 1605   K 3.3 (L) 02/05/2023 1404   CL 104 02/05/2023 1404   CO2 23 02/05/2023 1404   GLUCOSE 92 02/05/2023 1404   BUN 16 02/05/2023 1404   BUN 17 10/26/2022 1605   CREATININE 1.04 02/05/2023 1404   CREATININE 0.95 10/27/2022 0908   CALCIUM 8.8 (L) 02/05/2023 1404   PROT 7.7 10/27/2022 0908   ALBUMIN 3.9 11/04/2016 1937   AST 34 10/27/2022 0908   ALT 42 10/27/2022 0908   ALKPHOS 69 11/04/2016 1937   BILITOT 0.5 10/27/2022 0908   GFRNONAA >60 02/05/2023 1404   GFRNONAA 81 08/21/2012 0924  GFRAA >60 11/04/2016 1937   GFRAA >89 08/21/2012 0924    Imaging: DG Foot Complete Right Result Date: 02/05/2023 CLINICAL DATA:  Wound EXAM: RIGHT FOOT COMPLETE - 3 VIEW COMPARISON:  None Available. FINDINGS: There is no evidence of fracture or dislocation. There is hallux valgus. Plantar calcaneal spur. No osteolytic or osteoblastic changes. No soft tissue abnormalities. IMPRESSION: No acute osseous abnormalities. Electronically Signed   By: Layla Maw M.D.   On: 02/05/2023 13:17    ASSESSMENT & PLAN:   Assessment & Plan by Problem: Principal Problem:   Right foot infection   William [name] is a 62 y.o. male with pertinent PMH of HIV who presented with right foot wound and is admitted for right foot cellulitis.  Cellulitis of right foot Patient presenting with acute worsening of the right foot rash/wound that has been present for 1 month.  He has had severe itchiness of the right foot for 1 month that led him to scratch excessively.  3 days ago he developed pain which made him bedridden, and over the last 24 hours he has noticed the opening of multiple wounds on his right foot.  With multiple areas of breakdown and purulence that does appear to be infected.  Most likely the cause was  excessive scratching leading to skin breakdown and subsequent infection.  The lesions are painful for him.  Fortunately there are no signs of a systemic infection, x-rays negative, there is no leukocytosis or fever.  Will treat with IV antibiotics and pursue wound care. -Vancomycin per pharmacy -Follow blood cultures -Wound consult -Consider podiatry  HIV As of September, T cells 266, HIV load undetectable.  Continue Biktarvy.  HLD Continue rosuvastatin 10 daily.  Hypokalemia 3.3 on admission.  Replaced.  Check magnesium.  Smoker 2 cigarettes/day times many years, will offer nicotine patch.  Diet: Normal VTE: Enoxaparin IVF: None,None Code: Full  Dispo: Admit patient to Inpatient with expected length of stay greater than 2 midnights.  Signed: Katheran James, DO Internal Medicine Resident PGY-1  02/05/2023, 6:53 PM   Dr. Katheran James, DO Pager [phone]

## 2023-02-06 DIAGNOSIS — L309 Dermatitis, unspecified: Secondary | ICD-10-CM | POA: Diagnosis present

## 2023-02-06 DIAGNOSIS — Z8249 Family history of ischemic heart disease and other diseases of the circulatory system: Secondary | ICD-10-CM | POA: Diagnosis not present

## 2023-02-06 DIAGNOSIS — M79673 Pain in unspecified foot: Secondary | ICD-10-CM | POA: Diagnosis not present

## 2023-02-06 DIAGNOSIS — E876 Hypokalemia: Secondary | ICD-10-CM | POA: Diagnosis present

## 2023-02-06 DIAGNOSIS — L03115 Cellulitis of right lower limb: Secondary | ICD-10-CM | POA: Diagnosis not present

## 2023-02-06 DIAGNOSIS — B20 Human immunodeficiency virus [HIV] disease: Secondary | ICD-10-CM

## 2023-02-06 DIAGNOSIS — I10 Essential (primary) hypertension: Secondary | ICD-10-CM | POA: Diagnosis present

## 2023-02-06 DIAGNOSIS — L97519 Non-pressure chronic ulcer of other part of right foot with unspecified severity: Secondary | ICD-10-CM | POA: Diagnosis not present

## 2023-02-06 DIAGNOSIS — F17210 Nicotine dependence, cigarettes, uncomplicated: Secondary | ICD-10-CM | POA: Diagnosis present

## 2023-02-06 DIAGNOSIS — L97529 Non-pressure chronic ulcer of other part of left foot with unspecified severity: Secondary | ICD-10-CM | POA: Diagnosis not present

## 2023-02-06 DIAGNOSIS — E785 Hyperlipidemia, unspecified: Secondary | ICD-10-CM | POA: Diagnosis present

## 2023-02-06 LAB — BASIC METABOLIC PANEL
Anion gap: 10 (ref 5–15)
BUN: 12 mg/dL (ref 6–20)
CO2: 23 mmol/L (ref 22–32)
Calcium: 8.7 mg/dL — ABNORMAL LOW (ref 8.9–10.3)
Chloride: 103 mmol/L (ref 98–111)
Creatinine, Ser: 0.84 mg/dL (ref 0.61–1.24)
GFR, Estimated: >60 mL/min (ref >60)
Glucose, Bld: 107 mg/dL — ABNORMAL HIGH (ref 70–99)
Potassium: 3.2 mmol/L — ABNORMAL LOW (ref 3.5–5.1)
Sodium: 136 mmol/L (ref 135–145)

## 2023-02-06 LAB — CBC
HCT: 36.6 % — ABNORMAL LOW (ref 39.0–52.0)
Hemoglobin: 12.2 g/dL — ABNORMAL LOW (ref 13.0–17.0)
MCH: 28.4 pg (ref 26.0–34.0)
MCHC: 33.3 g/dL (ref 30.0–36.0)
MCV: 85.3 fL (ref 80.0–100.0)
Platelets: 263 10*3/uL (ref 150–400)
RBC: 4.29 MIL/uL (ref 4.22–5.81)
RDW: 13.7 % (ref 11.5–15.5)
WBC: 6.3 10*3/uL (ref 4.0–10.5)
nRBC: 0 % (ref 0.0–0.2)

## 2023-02-06 LAB — GLUCOSE, CAPILLARY
Glucose-Capillary: 102 mg/dL — ABNORMAL HIGH (ref 70–99)
Glucose-Capillary: 99 mg/dL (ref 70–99)
Glucose-Capillary: 133 mg/dL — ABNORMAL HIGH (ref 70–99)
Glucose-Capillary: 180 mg/dL — ABNORMAL HIGH (ref 70–99)

## 2023-02-06 LAB — MAGNESIUM: Magnesium: 1.9 mg/dL (ref 1.7–2.4)

## 2023-02-06 MED ORDER — CEFTRIAXONE SODIUM 2 G IJ SOLR
2.0000 g | INTRAMUSCULAR | Status: DC
  Administered 2023-02-06 – 2023-02-09 (×4): 2 g via INTRAVENOUS
  Filled 2023-02-06 (×4): qty 20

## 2023-02-06 MED ORDER — POTASSIUM CHLORIDE CRYS ER 20 MEQ PO TBCR
40.0000 meq | EXTENDED_RELEASE_TABLET | Freq: Two times a day (BID) | ORAL | Status: DC
  Administered 2023-02-06 – 2023-02-08 (×6): 40 meq via ORAL
  Filled 2023-02-06 (×7): qty 2

## 2023-02-06 MED ORDER — OXYCODONE HCL 5 MG PO TABS
5.0000 mg | ORAL_TABLET | ORAL | Status: DC | PRN
  Administered 2023-02-06 – 2023-02-09 (×7): 10 mg via ORAL
  Administered 2023-02-10: 5 mg via ORAL
  Filled 2023-02-06 (×6): qty 2
  Filled 2023-02-06: qty 1
  Filled 2023-02-06 (×2): qty 2

## 2023-02-06 NOTE — Care Management Obs Status (Signed)
MEDICARE OBSERVATION STATUS NOTIFICATION   Patient Details  Name: William [name] MRN: [id number] Date of Birth: 04/27/1960   Medicare Observation Status Notification Given:  Yes    Harriet Masson, RN 02/06/2023, 10:16 AM

## 2023-02-06 NOTE — Evaluation (Signed)
Physical Therapy Evaluation Patient Details Name: William [name] MRN: [id number] DOB: 17-Jan-1961 Today's Date: 02/06/2023  History of Present Illness  The pt is a 62 yo male presenting 12/29 with R foot wound, admitted for management of cellulitis. PMH includes: HIV, HTN, HLD, tobacco use, and pneumothorax.   Clinical Impression  Pt in bed upon arrival of PT, agreeable to evaluation at this time. Prior to admission the pt was ambulating without DME, reports recent limitation by pain, but otherwise independent with all mobility, still working full time. The pt was able to complete all bed mobility and transfers with good stability, but prefers use of DME for UE support to reduce wt through RLE with walking. He tried use of RW and hallway rail to mimic cane, is stable with both but prefers cane as it is easier to manage with stairs. Pt is safe for return home with intermittent support for IADLs, anticipate his mobility will return to baseline when pain is under control.     If plan is discharge home, recommend the following: Help with stairs or ramp for entrance;Assist for transportation;Assistance with cooking/housework   Can travel by private Interior and spatial designer  Recommendations for Other Services       Functional Status Assessment Patient has had a recent decline in their functional status and demonstrates the ability to make significant improvements in function in a reasonable and predictable amount of time.     Precautions / Restrictions Precautions Precautions: Fall Restrictions Weight Bearing Restrictions Per Provider Order: No      Mobility  Bed Mobility Overal bed mobility: Independent                  Transfers Overall transfer level: Independent Equipment used: None               General transfer comment: completed without DME, taught how to manage RW for pain control.    Ambulation/Gait Ambulation/Gait assistance:  Supervision Gait Distance (Feet): 75 Feet Assistive device: Rolling walker (2 wheels) (hallway rail) Gait Pattern/deviations: Step-through pattern, Decreased stride length, Decreased weight shift to right Gait velocity: decreased     General Gait Details: small steps with limited wt bearing on RLE. tried with BUE support on RW and single UE support on hallway rail to mimic cane. good stability either way, pt prefers rail for ease      Balance Overall balance assessment: Mild deficits observed, not formally tested                                           Pertinent Vitals/Pain Pain Assessment Pain Assessment: 0-10 Pain Score: 8  Pain Location: R foot Pain Descriptors / Indicators: Discomfort, Dull, Grimacing, Sore Pain Intervention(s): Limited activity within patient's tolerance, Monitored during session, Repositioned, Patient requesting pain meds-RN notified    Home Living Family/patient expects to be discharged to:: Group home Living Arrangements: Other (Comment) (at a rooming house) Available Help at Discharge: Family;Friend(s) Type of Home: Group Home Home Access: Stairs to enter Entrance Stairs-Rails: Lawyer of Steps: 3-4   Home Layout: One level Home Equipment: None      Prior Function Prior Level of Function : Driving             Mobility Comments: no use of DME ADLs Comments: independent, working 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     Extremity/Trunk Assessment   Upper Extremity Assessment Upper Extremity Assessment: Overall WFL for tasks assessed    Lower Extremity Assessment Lower Extremity Assessment: RLE deficits/detail;LLE deficits/detail RLE Deficits / Details: limited by pain in foot/ankle, but denies difference in sensation and good power/strength at hip and knee RLE Sensation: WNL RLE Coordination: WNL LLE Deficits / Details: limited by pain in foot/ankle, but denies difference  in sensation and good power/strength at hip and knee LLE Sensation: WNL LLE Coordination: WNL    Cervical / Trunk Assessment Cervical / Trunk Assessment: Normal  Communication   Communication Communication: No apparent difficulties Cueing Techniques: Verbal cues  Cognition Arousal: Alert Behavior During Therapy: WFL for tasks assessed/performed Overall Cognitive Status: Within Functional Limits for tasks assessed                                          General Comments General comments (skin integrity, edema, etc.): VSS on RA        Assessment/Plan    PT Assessment Patient needs continued PT services  PT Problem List Decreased activity tolerance;Decreased balance;Decreased mobility;Pain       PT Treatment Interventions DME instruction;Gait training;Stair training;Functional mobility training;Therapeutic activities;Therapeutic exercise;Balance training    PT Goals (Current goals can be found in the Care Plan section)  Acute Rehab PT Goals Patient Stated Goal: return home, reduce pain PT Goal Formulation: With patient Time For Goal Achievement: 02/20/23 Potential to Achieve Goals: Good    Frequency Min 1X/week        AM-PAC PT "6 Clicks" Mobility  Outcome Measure Help needed turning from your back to your side while in a flat bed without using bedrails?: None Help needed moving from lying on your back to sitting on the side of a flat bed without using bedrails?: None Help needed moving to and from a bed to a chair (including a wheelchair)?: A Little Help needed standing up from a chair using your arms (e.g., wheelchair or bedside chair)?: A Little Help needed to walk in hospital room?: A Little Help needed climbing 3-5 steps with a railing? : A Little 6 Click Score: 20    End of Session Equipment Utilized During Treatment: Gait belt Activity Tolerance: Patient tolerated treatment well Patient left: in chair;with call bell/phone within  reach Nurse Communication: Mobility status;Patient requests pain meds PT Visit Diagnosis: Unsteadiness on feet (R26.81);Pain Pain - Right/Left: Right Pain - part of body: Ankle and joints of foot    Time: 8295-6213 PT Time Calculation (min) (ACUTE ONLY): 23 min   Charges:   PT Evaluation $PT Eval Low Complexity: 1 Low PT Treatments $Gait Training: 8-22 mins PT General Charges $$ ACUTE PT VISIT: 1 Visit         Vickki Muff, PT, DPT   Acute Rehabilitation Department Office [phone] Secure Chat Communication Preferred  Ronnie Derby 02/06/2023, 12:51 PM

## 2023-02-06 NOTE — TOC Initial Note (Signed)
Transition of Care [facility]) - Initial/Assessment Note    Patient Details  Name: William [name] MRN: [id number] Date of Birth: 07/30/1960  [facility] CM/SW Contact:    Harriet Masson, RN Phone Number: 02/06/2023, 3:59 PM  Clinical Narrative:                 Spoke to patient regarding transition needs. Patient states he will stay with his mother and his mother use to be a nurse and can help with dressing changes.  This RNCM offered patient choice for home health and patient deferred to this RNCM to find highly rated agency. Cindie with [facility] accepted referral. Patient and bedside RN is aware patient's mother needs to learn how to do the dressing change prior to discharge.  Patient will need cane ordered prior to discharge.   TOC following.  Expected Discharge Plan: Home w Home Health Services Barriers to Discharge: Continued Medical Work up   Patient Goals and CMS Choice Patient states their goals for this hospitalization and ongoing recovery are:: return home CMS Medicare.gov Compare Post Acute Care list provided to:: Patient Choice offered to / list presented to : Patient      Expected Discharge Plan and Services   Discharge Planning Services: CM Consult Post Acute Care Choice: Home Health, Durable Medical Equipment Living arrangements for the past 2 months: Apartment                           HH Arranged: RN HH Agency: [facility] Date [facility] Agency Contacted: 02/06/23 Time HH Agency Contacted: [phone] Representative spoke with at [facility] Agency: Cindie  Prior Living Arrangements/Services Living arrangements for the past 2 months: Apartment Lives with:: Parents (Mother) Patient language and need for interpreter reviewed:: Yes Do you feel safe going back to the place where you live?: Yes      Need for Family Participation in Patient Care: Yes (Comment) Care giver support system in place?: Yes (comment)   Criminal Activity/Legal Involvement  Pertinent to Current Situation/Hospitalization: No - Comment as needed  Activities of Daily Living   ADL Screening (condition at time of admission) Independently performs ADLs?: Yes (appropriate for developmental age) Is the patient deaf or have difficulty hearing?: No Does the patient have difficulty seeing, even when wearing glasses/contacts?: No Does the patient have difficulty concentrating, remembering, or making decisions?: No  Permission Sought/Granted Permission sought to share information with : Photographer granted to share info w AGENCY: HH        Emotional Assessment Appearance:: Appears stated age Attitude/Demeanor/Rapport: Engaged Affect (typically observed): Accepting Orientation: : Oriented to Situation, Oriented to  Time, Oriented to Place, Oriented to Self Alcohol / Substance Use: Not Applicable Psych Involvement: No (comment)  Admission diagnosis:  Cellulitis of right lower extremity [L03.115] Right foot infection [L08.9] Patient Active Problem List   Diagnosis Date Noted   Right foot infection 02/05/2023   Hyperlipidemia 09/01/2022   Dysphagia 01/12/2021   Gastritis 01/12/2021   Screening for colon cancer 05/25/2020   Hypertension 03/27/2019   GERD with esophagitis 09/01/2016   Cigarette smoker 12/23/2014   Seasonal allergies 01/22/2013   Human immunodeficiency virus (HIV) disease (HCC) 07/10/2006   Alcohol use disorder, moderate, dependence (HCC) 07/10/2006   PCP:  Katheran James, DO Pharmacy:   [facility] [address] [place] [address] Phone: [phone] Fax: [phone]  Social Drivers of Health (SDOH) Social History: SDOH Screenings   Food Insecurity: No Food Insecurity (02/05/2023)  Housing: Low Risk  (02/05/2023)  Transportation Needs: No Transportation Needs (02/05/2023)  Utilities: Not At Risk (02/05/2023)  Alcohol Screen: Low Risk  (03/15/2022)   Depression (PHQ2-9): Low Risk  (11/10/2022)  Financial Resource Strain: Low Risk  (03/15/2022)  Physical Activity: Insufficiently Active (03/15/2022)  Social Connections: Socially Isolated (03/15/2022)  Stress: No Stress Concern Present (08/17/2020)  Tobacco Use: Medium Risk (02/05/2023)   SDOH Interventions:     Readmission Risk Interventions     No data to display

## 2023-02-06 NOTE — Plan of Care (Signed)
  Problem: Education: Goal: Knowledge of General Education information will improve Description: Including pain rating scale, medication(s)/side effects and non-pharmacologic comfort measures Outcome: Not Progressing   Problem: Health Behavior/Discharge Planning: Goal: Ability to manage health-related needs will improve Outcome: Not Progressing   

## 2023-02-07 ENCOUNTER — Other Ambulatory Visit: Payer: Self-pay

## 2023-02-07 DIAGNOSIS — L03115 Cellulitis of right lower limb: Secondary | ICD-10-CM | POA: Diagnosis not present

## 2023-02-07 DIAGNOSIS — B20 Human immunodeficiency virus [HIV] disease: Secondary | ICD-10-CM | POA: Diagnosis not present

## 2023-02-07 DIAGNOSIS — L309 Dermatitis, unspecified: Secondary | ICD-10-CM | POA: Diagnosis not present

## 2023-02-07 LAB — BASIC METABOLIC PANEL
Anion gap: 8 (ref 5–15)
BUN: 8 mg/dL (ref 6–20)
CO2: 24 mmol/L (ref 22–32)
Calcium: 8.5 mg/dL — ABNORMAL LOW (ref 8.9–10.3)
Chloride: 105 mmol/L (ref 98–111)
Creatinine, Ser: 0.74 mg/dL (ref 0.61–1.24)
GFR, Estimated: >60 mL/min (ref >60)
Glucose, Bld: 131 mg/dL — ABNORMAL HIGH (ref 70–99)
Potassium: 3.6 mmol/L (ref 3.5–5.1)
Sodium: 137 mmol/L (ref 135–145)

## 2023-02-07 MED ORDER — OXYCODONE HCL 5 MG PO TABS
10.0000 mg | ORAL_TABLET | Freq: Once | ORAL | Status: AC
  Administered 2023-02-07: 10 mg via ORAL

## 2023-02-07 MED ORDER — SENNOSIDES-DOCUSATE SODIUM 8.6-50 MG PO TABS
2.0000 | ORAL_TABLET | Freq: Every day | ORAL | Status: DC
  Administered 2023-02-07 – 2023-02-10 (×4): 2 via ORAL
  Filled 2023-02-07 (×4): qty 2

## 2023-02-07 MED ORDER — POLYETHYLENE GLYCOL 3350 17 G PO PACK
17.0000 g | PACK | Freq: Every day | ORAL | Status: DC
  Administered 2023-02-07 – 2023-02-10 (×4): 17 g via ORAL
  Filled 2023-02-07 (×4): qty 1

## 2023-02-07 NOTE — Plan of Care (Signed)
 Patient alert/oriented X4. Patient compliant with medication administration and tolerated IV abx. Patient was up in chair majority of shift and oxycodone  administered as needed for pain in R foot. New dressing placed on R foot. VSS, no complaints at this time.   Problem: Education: Goal: Knowledge of General Education information will improve Description: Including pain rating scale, medication(s)/side effects and non-pharmacologic comfort measures Outcome: Progressing   Problem: Health Behavior/Discharge Planning: Goal: Ability to manage health-related needs will improve Outcome: Progressing   Problem: Clinical Measurements: Goal: Ability to maintain clinical measurements within normal limits will improve Outcome: Progressing   Problem: Clinical Measurements: Goal: Will remain free from infection Outcome: Progressing   Problem: Clinical Measurements: Goal: Diagnostic test results will improve Outcome: Progressing   Problem: Clinical Measurements: Goal: Respiratory complications will improve Outcome: Progressing   Problem: Clinical Measurements: Goal: Cardiovascular complication will be avoided Outcome: Progressing   Problem: Activity: Goal: Risk for activity intolerance will decrease Outcome: Progressing   Problem: Nutrition: Goal: Adequate nutrition will be maintained Outcome: Progressing   Problem: Coping: Goal: Level of anxiety will decrease Outcome: Progressing   Problem: Elimination: Goal: Will not experience complications related to bowel motility Outcome: Progressing   Problem: Elimination: Goal: Will not experience complications related to urinary retention Outcome: Progressing   Problem: Pain Management: Goal: General experience of comfort will improve Outcome: Progressing   Problem: Safety: Goal: Ability to remain free from injury will improve Outcome: Progressing   Problem: Skin Integrity: Goal: Risk for impaired skin integrity will  decrease Outcome: Progressing

## 2023-02-07 NOTE — TOC Progression Note (Signed)
 Transition of Care [facility]) - Progression Note    Patient Details  Name: William [name] MRN: [id number] Date of Birth: 1960/05/26  [facility]) CM/SW Contact  Marval Gell, RN Phone Number: 02/07/2023, 8:59 AM  Clinical Narrative:     Ordered cane to be delivered to the room through [facility]   Expected Discharge Plan: Home w Home Health Services Barriers to Discharge: Continued Medical Work up  Expected Discharge Plan and Services   Discharge Planning Services: CM Consult Post Acute Care Choice: Home Health, Durable Medical Equipment Living arrangements for the past 2 months: Apartment                           HH Arranged: RN HH Agency: [facility] Date [facility] Agency Contacted: 02/06/23 Time HH Agency Contacted: 1559 Representative spoke with at [facility] Agency: Cindie   Social Determinants of Health (SDOH) Interventions SDOH Screenings   Food Insecurity: No Food Insecurity (02/05/2023)  Housing: Low Risk  (02/05/2023)  Transportation Needs: No Transportation Needs (02/05/2023)  Utilities: Not At Risk (02/05/2023)  Alcohol Screen: Low Risk  (03/15/2022)  Depression (PHQ2-9): Low Risk  (11/10/2022)  Financial Resource Strain: Low Risk  (03/15/2022)  Physical Activity: Insufficiently Active (03/15/2022)  Social Connections: Socially Isolated (03/15/2022)  Stress: No Stress Concern Present (08/17/2020)  Tobacco Use: Medium Risk (02/05/2023)    Readmission Risk Interventions     No data to display

## 2023-02-07 NOTE — Progress Notes (Addendum)
 HD#1 SUBJECTIVE:  Patient Summary: William [name] is a 62 y.o. male with pertinent PMH of HIV who presented with right foot wound and is admitted for right foot cellulitis.    Overnight Events: None   Interim History: Pain is stable, present with movement and dressing changes.  Pain is not significant in between.  William [name] denies fevers and chills.  This injury is impacting his ability to sleep.  OBJECTIVE:  Vital Signs: Vitals:   02/06/23 1606 02/06/23 1933 02/07/23 0441 02/07/23 0715  BP: 101/75 111/69 108/83 117/85  Pulse: 84 93 73 69  Resp: 18 18 18 16   Temp: 98.6 F (37 C) 97.9 F (36.6 C) 98.3 F (36.8 C) 98.1 F (36.7 C)  TempSrc: Oral Oral Oral Oral  SpO2: 95% 93% 96% 94%  Weight:      Height:       Supplemental O2: Room Air SpO2: 94 %  Filed Weights   02/05/23 2004  Weight: 83.7 kg     Intake/Output Summary (Last 24 hours) at 02/07/2023 1419 Last data filed at 02/07/2023 0900 Gross per 24 hour  Intake 480 ml  Output 1800 ml  Net -1320 ml   Net IO Since Admission: -2,065 mL [02/07/23 1419]  Physical Exam: Physical Exam Constitutional:      General: William [name] is not in acute distress.    Appearance: Normal appearance. William [name] is not ill-appearing.  Cardiovascular:     Rate and Rhythm: Normal rate.     Pulses: Normal pulses.  Pulmonary:     Effort: Pulmonary effort is normal.  Musculoskeletal:        General: Signs of injury present.     Right lower leg: Edema present.  Skin:    General: Skin is warm and dry.     Comments: There is diffuse eczematous dermatitis on the right foot with multiple shallow open wounds. Possible venous stasis changes as well. There is a smaller area of similar rash without wonds on the dorsum of the L foot. Eczema on interior bilateral arms. Continued exudate since yesterday. No major progression of ulceration. Some evidence of granulation tissue. Neurological:     Mental Status: William [name] is alert.  Psychiatric:        Mood and Affect:  Mood normal.        Behavior: Behavior normal.   Patient Lines/Drains/Airways Status     Active Line/Drains/Airways     Name Placement date Placement time Site Days   Peripheral IV 02/06/23 20 G Anterior;Left Forearm 02/06/23  2130  Forearm  1   Wound / Incision (Open or Dehisced) 02/06/23 Venous stasis ulcer Pretibial Left open areas r/t vascular disease 02/06/23  0013  Pretibial  1   Wound / Incision (Open or Dehisced) 02/05/23 Venous stasis ulcer Foot Anterior;Right dry flaky w/ one area thats pink w/ scab on top 02/05/23  2000  Foot  2             ASSESSMENT/PLAN:  Assessment: Principal Problem:   Right foot infection Active Problems:   Eczematous dermatitis  LEEAM CEDRONE is a 63 y.o. male with pertinent PMH of HIV who presented with right foot wound and is admitted for right foot cellulitis.    Plan: Cellulitis of right foot with ulceration Eczema Multiple open lesions on R foot secondary to excoriation and subsequent infection. Roughly stable from yesterday, there is exudate, no more skin breakdown, and there are signs of granulation in the wounds. Likely underliying eczema and venous stasis  changes induced vigorous scratching with subsequent infection. Very painful lesions.  Fortunately there are no signs of a systemic infection, x-rays negative, there is no leukocytosis or fever.  Will treat with IV antibiotics and pursue wound care. Monitor for need of debridement, would call ortho. Consider steroid cream for other eczema. -Vancomycin  per pharmacy and ceftriaxone . Today is day 3 of abx. -For pain, oxycodone  5-10 q4 prn, senokot prn for bowel regimen -Follow blood cultures - no growth at 2 days -Wound care, gentle cleansing -Outpatient wound clinic   HIV As of September, T cells 266, HIV load undetectable.  Continue Biktarvy .   HLD Continue rosuvastatin  10 daily.   Hypokalemia (resolved) 3.3 on admission.  Replaced.  Magnesium  WNL.   Smoker 2  cigarettes/day times many years, will offer nicotine  patch.  Best Practice: Diet: Regular diet IVF: None VTE: enoxaparin  (LOVENOX ) injection 40 mg Start: 02/05/23 1700 Code: Full AB: Vancomycin  and Ceftriaxone  DISPO: Anticipated discharge in 3-4 days to Home pending  wound improvement .  Signature: Lonni Africa, D.O.  Internal Medicine Resident, PGY-1 [facility]  Pager: [phone] 2:19 PM, 02/07/2023   Please contact the on call pager after 5 pm and on weekends at [phone].

## 2023-02-07 NOTE — Plan of Care (Signed)
  Problem: Education: Goal: Knowledge of General Education information will improve Description: Including pain rating scale, medication(s)/side effects and non-pharmacologic comfort measures Outcome: Progressing   Problem: Clinical Measurements: Goal: Diagnostic test results will improve Outcome: Progressing Goal: Respiratory complications will improve Outcome: Progressing Goal: Cardiovascular complication will be avoided Outcome: Progressing   Problem: Activity: Goal: Risk for activity intolerance will decrease Outcome: Progressing   Problem: Nutrition: Goal: Adequate nutrition will be maintained Outcome: Progressing   Problem: Elimination: Goal: Will not experience complications related to bowel motility Outcome: Progressing Goal: Will not experience complications related to urinary retention Outcome: Progressing   Problem: Pain Management: Goal: General experience of comfort will improve Outcome: Progressing   Problem: Safety: Goal: Ability to remain free from injury will improve Outcome: Progressing   Problem: Skin Integrity: Goal: Risk for impaired skin integrity will decrease Outcome: Progressing

## 2023-02-07 NOTE — Plan of Care (Signed)
   Problem: Education: Goal: Knowledge of General Education information will improve Description Including pain rating scale, medication(s)/side effects and non-pharmacologic comfort measures Outcome: Progressing

## 2023-02-08 ENCOUNTER — Encounter (HOSPITAL_COMMUNITY): Payer: Self-pay | Admitting: Internal Medicine

## 2023-02-08 DIAGNOSIS — L309 Dermatitis, unspecified: Secondary | ICD-10-CM | POA: Diagnosis not present

## 2023-02-08 DIAGNOSIS — B20 Human immunodeficiency virus [HIV] disease: Secondary | ICD-10-CM | POA: Diagnosis not present

## 2023-02-08 DIAGNOSIS — L03115 Cellulitis of right lower limb: Secondary | ICD-10-CM | POA: Diagnosis present

## 2023-02-08 LAB — BASIC METABOLIC PANEL
Anion gap: 6 (ref 5–15)
BUN: 5 mg/dL — ABNORMAL LOW (ref 6–20)
CO2: 27 mmol/L (ref 22–32)
Calcium: 8.9 mg/dL (ref 8.9–10.3)
Chloride: 106 mmol/L (ref 98–111)
Creatinine, Ser: 0.8 mg/dL (ref 0.61–1.24)
GFR, Estimated: >60 mL/min (ref >60)
Glucose, Bld: 98 mg/dL (ref 70–99)
Potassium: 4.2 mmol/L (ref 3.5–5.1)
Sodium: 139 mmol/L (ref 135–145)

## 2023-02-08 MED ORDER — MELATONIN 3 MG PO TABS
3.0000 mg | ORAL_TABLET | Freq: Every day | ORAL | Status: DC
  Administered 2023-02-08 – 2023-02-09 (×2): 3 mg via ORAL
  Filled 2023-02-08 (×2): qty 1

## 2023-02-08 NOTE — Plan of Care (Addendum)
 Patient alert/oriented X4. Patient compliant with medication administration and administered oxycodone  as needed for R foot/leg pain. Patient was up in chair majority of the shift and tolerated IV abx. Dressing changed on R foot. Pending IV team placement of new PIV due to old one being occluded. VSS, will continue to monitor.   Problem: Education: Goal: Knowledge of General Education information will improve Description: Including pain rating scale, medication(s)/side effects and non-pharmacologic comfort measures Outcome: Progressing   Problem: Health Behavior/Discharge Planning: Goal: Ability to manage health-related needs will improve Outcome: Progressing   Problem: Clinical Measurements: Goal: Ability to maintain clinical measurements within normal limits will improve Outcome: Progressing   Problem: Clinical Measurements: Goal: Will remain free from infection Outcome: Progressing   Problem: Clinical Measurements: Goal: Diagnostic test results will improve Outcome: Progressing   Problem: Clinical Measurements: Goal: Respiratory complications will improve Outcome: Progressing   Problem: Clinical Measurements: Goal: Cardiovascular complication will be avoided Outcome: Progressing   Problem: Activity: Goal: Risk for activity intolerance will decrease Outcome: Progressing   Problem: Nutrition: Goal: Adequate nutrition will be maintained Outcome: Progressing   Problem: Coping: Goal: Level of anxiety will decrease Outcome: Progressing   Problem: Elimination: Goal: Will not experience complications related to bowel motility Outcome: Progressing   Problem: Elimination: Goal: Will not experience complications related to urinary retention Outcome: Progressing   Problem: Pain Management: Goal: General experience of comfort will improve Outcome: Progressing   Problem: Safety: Goal: Ability to remain free from injury will improve Outcome: Progressing   Problem: Skin  Integrity: Goal: Risk for impaired skin integrity will decrease Outcome: Progressing

## 2023-02-08 NOTE — Progress Notes (Addendum)
 HD#2 SUBJECTIVE:  Patient Summary: William [name] is a 63 y.o. male with pertinent PMH of HIV who was admitted for right foot cellulitis and multiple painful ulcerations.  Fewer, smaller ulcerations are noted on distal L foot.   Overnight Events: None    Interim History: Pain is stable, worse with movement and dressing changes.  Pain is not significant in between.  He denies fevers and chills.  This injury is impacting his ability to sleep.  OBJECTIVE:  Vital Signs: Vitals:   02/07/23 2018 02/08/23 0522 02/08/23 0726 02/08/23 1551  BP: (!) 127/91 114/84 (!) 153/106 117/88  Pulse: 81 71 79 82  Resp: 18 18 18 18   Temp: 98 F (36.7 C) 97.9 F (36.6 C) (!) 97.5 F (36.4 C) 97.8 F (36.6 C)  TempSrc: Oral Oral Oral Oral  SpO2: 97% 98% 97% 98%  Weight:      Height:       Supplemental O2: Room Air SpO2: 98 %  Filed Weights   02/05/23 2004  Weight: 83.7 kg     Intake/Output Summary (Last 24 hours) at 02/08/2023 1811 Last data filed at 02/08/2023 1600 Gross per 24 hour  Intake 668.93 ml  Output 4800 ml  Net -4131.07 ml   Net IO Since Admission: -6,996.07 mL [02/08/23 1811]  Physical Exam: Physical Exam Constitutional:      General: He is not in acute distress.    Appearance: Normal appearance. He is not ill-appearing.  Cardiovascular:     Rate and Rhythm: Normal rate.     Pulses: Normal pulses.  Pulmonary:     Effort: Pulmonary effort is normal.  Musculoskeletal:        General: Signs of injury present.     Right lower leg: Edema present.  Skin:    General: Skin is warm and dry.     Comments: There was diffuse eczematous dermatitis changes on the right foot seen initially with multiple shallow open wounds.  There are is a smaller area  localized on the dorsum of the distal L foot of thickened (eczematous appearance) with very small ulcerations. Eczema on interior bilateral arms. Exudate reduced since yesterday. No progression of ulceration. Some evidence of  granulation tissue in ulcer beds.  Dressing changes are very painful.  Neurological:     Mental Status: He is alert.  Psychiatric:        Mood and Affect: Mood normal.        Behavior: Behavior normal.   ASSESSMENT/PLAN:  Assessment: Principal Problem:   Skin ulcers of foot, bilateral (HCC), R> L, painful Active Problems:   Eczematous dermatitis   Cellulitis of foot, right  William [name] is a 63 y.o. male with pertinent PMH of HIV who presented with right foot wound and is admitted for right foot cellulitis.    Plan: Cellulitis of right foot with multiple ulcerations Continue with wound care and antibiotics. Open lesions on R foot presumed to be secondary to excoriation and subsequent infection, though the appearance is atypical. Improving. Exudate reduced. Granulation tissue in some ulcer beds.  Monitor for need of surgical debridement (none anticipated), would call ortho. -Vancomycin  per pharmacy and ceftriaxone . Today is day 4 of abx. -For pain, oxycodone  5-10 q4 prn, senokot prn for bowel regimen -Follow blood cultures - no growth at 3 days -Wound care, gentle cleansing -Outpatient wound clinic -Wound care: Rinse gently with saline. Cover raw ulcer beds with petrolatum gauze and non-stick telfa, wrapped in Kerlix.    HIV  As of September, T cells 266, HIV load undetectable.  Continue Biktarvy .   HLD Continue rosuvastatin  10 daily.   Hypokalemia (resolved) 3.3 on admission.  Replaced.  Magnesium  WNL.   Smoker 2 cigarettes/day times many years, will offer nicotine  patch.   Best Practice: Diet: Regular diet IVF: None VTE: enoxaparin  (LOVENOX ) injection 40 mg Start: 02/05/23 1700 Code: Full AB: Vancomycin  and Ceftriaxone  DISPO: Anticipated discharge in 3-4 days to Home pending  wound improvement .  Signature: Lonni Africa, D.O.  Internal Medicine Resident, PGY-1 [facility]  Pager: [phone] 6:11 PM, 02/08/2023   Please  contact the on call pager after 5 pm and on weekends at [phone].

## 2023-02-09 DIAGNOSIS — L089 Local infection of the skin and subcutaneous tissue, unspecified: Secondary | ICD-10-CM

## 2023-02-09 DIAGNOSIS — L97519 Non-pressure chronic ulcer of other part of right foot with unspecified severity: Secondary | ICD-10-CM

## 2023-02-09 DIAGNOSIS — L97529 Non-pressure chronic ulcer of other part of left foot with unspecified severity: Secondary | ICD-10-CM

## 2023-02-09 MED ORDER — DOXYCYCLINE HYCLATE 100 MG PO TABS
100.0000 mg | ORAL_TABLET | Freq: Two times a day (BID) | ORAL | Status: DC
  Administered 2023-02-09 – 2023-02-10 (×2): 100 mg via ORAL
  Filled 2023-02-09 (×2): qty 1

## 2023-02-09 MED ORDER — CEFADROXIL 500 MG PO CAPS
500.0000 mg | ORAL_CAPSULE | Freq: Two times a day (BID) | ORAL | Status: DC
  Administered 2023-02-09 – 2023-02-10 (×2): 500 mg via ORAL
  Filled 2023-02-09 (×2): qty 1

## 2023-02-09 NOTE — Consult Note (Addendum)
 WOC Nurse consult was performed remotely today after review of progress notes.  WOC consult previously performed 12/29 for foot and ankle wounds.   Please refer to previous consult. Primary team stated dressings were sticking and painful and requested to be changed as follows for bedside nurses to perform: Rinse gently with saline. Cover raw ulcer beds with petrolatum gauze and non-stick telfa, wrapped in Kerlix.  Order clarification provided in the EMR.  Please re-consult if further assistance is needed.  Thank-you,  Stephane Fought MSN, RN, CWOCN, [place], CNS [phone]

## 2023-02-09 NOTE — Progress Notes (Addendum)
 HD#3 SUBJECTIVE:  Patient Summary: William [name] is a 63 y.o. male with pertinent PMH of HIV who was admitted for right foot cellulitis and multiple painful ulcerations.  Fewer, smaller ulcerations are noted on distal L foot.   Overnight Events: None  Interim History: Status unchanged since yesterday.  He denies any worsening symptoms.  Denies fevers and chills.  States his pain is manageable.  It is worse when he moves and walks on it.  He has not had a recent bowel movement.  He denies abdominal pain nausea or vomiting.  OBJECTIVE:  Vital Signs: Vitals:   02/08/23 0726 02/08/23 1551 02/08/23 1958 02/09/23 0422  BP: (!) 153/106 117/88 131/86 113/86  Pulse: 79 82 79 72  Resp: 18 18 18 18   Temp: (!) 97.5 F (36.4 C) 97.8 F (36.6 C) 98.1 F (36.7 C) 98 F (36.7 C)  TempSrc: Oral Oral Oral Oral  SpO2: 97% 98% 98% 95%  Weight:      Height:       Supplemental O2: Room Air SpO2: 95 %  Filed Weights   02/05/23 2004  Weight: 83.7 kg     Intake/Output Summary (Last 24 hours) at 02/09/2023 9346 Last data filed at 02/09/2023 9661 Gross per 24 hour  Intake --  Output 3100 ml  Net -3100 ml   Net IO Since Admission: -8,096.07 mL [02/09/23 0653]  Physical Exam: Physical Exam Constitutional:      General: He is not in acute distress.    Appearance: Normal appearance. He is not ill-appearing.  Cardiovascular:     Rate and Rhythm: Normal rate.     Pulses: Normal pulses.  Pulmonary:     Effort: Pulmonary effort is normal.  Musculoskeletal:        General: Signs of injury present.     Right lower leg: Edema present.  Skin:    General: Skin is warm and dry.     Comments: There was diffuse eczematous dermatitis changes on the right foot seen initially with multiple shallow open wounds.  There are is a smaller area  localized on the dorsum of the distal L foot of thickened (eczematous appearance) with very small ulcerations. Eczema on interior bilateral arms. Exudate reduced  since yesterday. No progression of ulceration. Some evidence of granulation tissue in ulcer beds.  Dressing changes are very painful but improved from prior days.  Neurological:     Mental Status: He is alert.  Psychiatric:        Mood and Affect: Mood normal.        Behavior: Behavior normal.   Patient Lines/Drains/Airways Status     Active Line/Drains/Airways     Name Placement date Placement time Site Days   Peripheral IV 02/08/23 20 G 1.75 Right;Anterior Forearm 02/08/23  1642  Forearm  1   Wound / Incision (Open or Dehisced) 02/06/23 Venous stasis ulcer Pretibial Left open areas r/t vascular disease 02/06/23  0013  Pretibial  3   Wound / Incision (Open or Dehisced) 02/05/23 Venous stasis ulcer Foot Anterior;Right dry flaky w/ one area thats pink w/ scab on top 02/05/23  2000  Foot  4             ASSESSMENT/PLAN:  Assessment: Principal Problem:   Skin ulcers of foot, bilateral (HCC), R> L, painful Active Problems:   Eczematous dermatitis   Cellulitis of foot, right  William [name] is a 63 y.o. male with pertinent PMH of HIV who presented with right foot  wound and is admitted for right foot cellulitis.    Plan: Cellulitis of right foot with multiple ulcerations Continue with wound care and antibiotics. Today will change to oral medicines. Open lesions on R foot presumed to be secondary to excoriation and subsequent infection, though the appearance is atypical. Improving. Exudate reduced. Granulation tissue in some ulcer beds.  Monitor for need of surgical debridement (none anticipated), would call ortho. -Vancomycin  per pharmacy and ceftriaxone  -> doxycycline  and cefadroxil . Today is day 5 of abx, anticipate 10 days total. -For pain, oxycodone  5-10 q4 prn, senokot BID and miralax  qd for bowel regimen -Follow blood cultures - no growth at 4 days -Outpatient wound clinic -Wound care: Rinse gently with saline. Cover raw ulcer beds with petrolatum gauze and non-stick telfa,  wrapped in Kerlix.    HIV As of September, T cells 266, HIV load undetectable.  Continue Biktarvy .   HLD Continue rosuvastatin  10 daily.   Hypokalemia (resolved) 3.3 on admission.  Replaced.  Magnesium  WNL.   Smoker 2 cigarettes/day times many years, will offer nicotine  patch.   Best Practice: Diet: Regular diet IVF: None VTE: enoxaparin  (LOVENOX ) injection 40 mg Start: 02/05/23 1700 Code: Full AB: Vancomycin  and Ceftriaxone  DISPO: Anticipated discharge in 3-4 days to Home pending  wound improvement .  Signature: Lonni Africa, D.O.  Internal Medicine Resident, PGY-1 [facility]  Pager: [phone] 6:53 AM, 02/09/2023   Please contact the on call pager after 5 pm and on weekends at [phone].

## 2023-02-09 NOTE — Progress Notes (Signed)
 Physical Therapy Treatment Patient Details Name: William [name] MRN: [id number] DOB: Jul 09, 1960 Today's Date: 02/09/2023   History of Present Illness The pt is a 63 yo male presenting 12/29 with R foot wound, admitted for management of cellulitis. PMH includes: HIV, HTN, HLD, tobacco use, and pneumothorax.    PT Comments  The pt was agreeable to session despite reports of 8/10 pain despite premedication. The pt was trained on use of cane to reduce wt bearing through RLE, but continues to be limited in distance due to pain. He is able to complete transfers and hallway ambulation with supervision and use of cane, did need repeated cues for gait pattern and use of cane for improved technique. Pt states he now plans to d/c to his mothers house where there are no stairs and family can assist with IADLs. Will continue to follow acutely to progress distance and maintain strength, but continue to feel he will be able to return home without follow up therapy when pain improves.   Messaged MD regarding possible shoe for RLE due to location of wounds, pain with walking and difficulty tolerating even pressure of sock.    If plan is discharge home, recommend the following: Help with stairs or ramp for entrance;Assist for transportation;Assistance with cooking/housework   Can travel by private [employer]    Recommendations for Other Services       Precautions / Restrictions Precautions Precautions: Fall Restrictions Weight Bearing Restrictions Per Provider Order: No     Mobility  Bed Mobility Overal bed mobility: Independent                  Transfers Overall transfer level: Independent Equipment used: None, Straight cane               General transfer comment: completed without DME, taught how to manage cane    Ambulation/Gait Ambulation/Gait assistance: Supervision Gait Distance (Feet): 100 Feet Assistive device: Straight cane Gait  Pattern/deviations: Step-through pattern, Decreased stride length, Decreased weight shift to right Gait velocity: decreased Gait velocity interpretation: <1.31 ft/sec, indicative of household ambulator   General Gait Details: small steps with limited wt bearing on RLE due to pain. continued cues for moving cane with RLE to decrease wt bearing on RLE. messaged MD about possible shoe for RLE. no overt LOB   Stairs             Wheelchair Mobility     Tilt Bed    Modified Rankin (Stroke Patients Only)       Balance Overall balance assessment: Mild deficits observed, not formally tested                                          Cognition Arousal: Alert Behavior During Therapy: WFL for tasks assessed/performed Overall Cognitive Status: Within Functional Limits for tasks assessed                                          Exercises General Exercises - Lower Extremity Ankle Circles/Pumps: AROM, Both, 10 reps, Supine Quad Sets: AROM, Both, 10 reps, Supine Straight Leg Raises: AROM, Both, 10 reps, Supine    General Comments General comments (skin integrity, edema, etc.): VSS on RA, dressing on R  foot clean and intact      Pertinent Vitals/Pain Pain Assessment Pain Assessment: 0-10 Pain Score: 8  Pain Location: R foot Pain Descriptors / Indicators: Discomfort, Dull, Grimacing, Sore Pain Intervention(s): Limited activity within patient's tolerance, Monitored during session, Repositioned     PT Goals (current goals can now be found in the care plan section) Acute Rehab PT Goals Patient Stated Goal: return home, reduce pain PT Goal Formulation: With patient Time For Goal Achievement: 02/20/23 Potential to Achieve Goals: Good Progress towards PT goals: Progressing toward goals    Frequency    Min 1X/week       AM-PAC PT 6 Clicks Mobility   Outcome Measure  Help needed turning from your back to your side while in a flat bed  without using bedrails?: None Help needed moving from lying on your back to sitting on the side of a flat bed without using bedrails?: None Help needed moving to and from a bed to a chair (including a wheelchair)?: A Little Help needed standing up from a chair using your arms (e.g., wheelchair or bedside chair)?: A Little Help needed to walk in hospital room?: A Little Help needed climbing 3-5 steps with a railing? : A Little 6 Click Score: 20    End of Session Equipment Utilized During Treatment: Gait belt Activity Tolerance: Patient tolerated treatment well Patient left: with call bell/phone within reach;in bed Nurse Communication: Mobility status;Patient requests pain meds PT Visit Diagnosis: Unsteadiness on feet (R26.81);Pain Pain - Right/Left: Right Pain - part of body: Ankle and joints of foot     Time: 9085-9071 PT Time Calculation (min) (ACUTE ONLY): 14 min  Charges:    $Gait Training: 8-22 mins PT General Charges $$ ACUTE PT VISIT: 1 Visit                     Izetta Call, PT, DPT   Acute Rehabilitation Department Office [phone] Secure Chat Communication Preferred   Izetta JULIANNA Call 02/09/2023, 9:55 AM

## 2023-02-09 NOTE — Progress Notes (Signed)
   02/09/23 1120  Mobility  Activity Ambulated with assistance in hallway  Level of Assistance Standby assist, set-up cues, supervision of patient - no hands on  Assistive Device Cane  Distance Ambulated (ft) 20 ft  Activity Response Tolerated fair  Mobility Referral Yes  Mobility visit 1 Mobility  Mobility Specialist Start Time (ACUTE ONLY) 1110  Mobility Specialist Stop Time (ACUTE ONLY) 1120  Mobility Specialist Time Calculation (min) (ACUTE ONLY) 10 min   Mobility Specialist: Progress Note  Pt agreeable to mobility session - received in bed. Required SB using cane. C/o RLE pain rated 9/10 . Returned to chair with all needs met - call bell within reach.   Virgle Boards, BS Mobility Specialist Please contact via SecureChat or Rehab office at [phone].

## 2023-02-09 NOTE — Care Management Important Message (Signed)
 Important Message  Patient Details  Name: William [name] MRN: [id number] Date of Birth: 1960/10/24   Important Message Given:  Yes - Medicare IM     Dorena Bodo 02/09/2023, 3:30 PM

## 2023-02-09 NOTE — Plan of Care (Signed)
 Patient alert/oriented X4. Patient compliant with medication administration and oxycodone  administered as needed for pain in R foot. Patient was up in chair for the majority of the shift. New dressing intact on R foot, VSS, no complaints at this time.   Problem: Education: Goal: Knowledge of General Education information will improve Description: Including pain rating scale, medication(s)/side effects and non-pharmacologic comfort measures Outcome: Progressing   Problem: Health Behavior/Discharge Planning: Goal: Ability to manage health-related needs will improve Outcome: Progressing   Problem: Clinical Measurements: Goal: Ability to maintain clinical measurements within normal limits will improve Outcome: Progressing   Problem: Clinical Measurements: Goal: Will remain free from infection Outcome: Progressing   Problem: Clinical Measurements: Goal: Diagnostic test results will improve Outcome: Progressing   Problem: Clinical Measurements: Goal: Respiratory complications will improve Outcome: Progressing   Problem: Activity: Goal: Risk for activity intolerance will decrease Outcome: Progressing   Problem: Nutrition: Goal: Adequate nutrition will be maintained Outcome: Progressing   Problem: Coping: Goal: Level of anxiety will decrease Outcome: Progressing   Problem: Elimination: Goal: Will not experience complications related to bowel motility Outcome: Progressing   Problem: Elimination: Goal: Will not experience complications related to urinary retention Outcome: Progressing   Problem: Pain Management: Goal: General experience of comfort will improve Outcome: Progressing   Problem: Safety: Goal: Ability to remain free from injury will improve Outcome: Progressing   Problem: Skin Integrity: Goal: Risk for impaired skin integrity will decrease Outcome: Progressing

## 2023-02-10 ENCOUNTER — Other Ambulatory Visit (HOSPITAL_COMMUNITY): Payer: Self-pay

## 2023-02-10 LAB — CULTURE, BLOOD (ROUTINE X 2)
Culture: NO GROWTH
Culture: NO GROWTH
Special Requests: ADEQUATE
Special Requests: ADEQUATE

## 2023-02-10 LAB — GLUCOSE, CAPILLARY: Glucose-Capillary: 89 mg/dL (ref 70–99)

## 2023-02-10 MED ORDER — SENNOSIDES-DOCUSATE SODIUM 8.6-50 MG PO TABS: 2.0000 | ORAL_TABLET | Freq: Two times a day (BID) | ORAL | Status: DC

## 2023-02-10 MED ORDER — TRIAMCINOLONE ACETONIDE 0.5 % EX OINT
TOPICAL_OINTMENT | Freq: Three times a day (TID) | CUTANEOUS | Status: DC
  Filled 2023-02-10: qty 15

## 2023-02-10 MED ORDER — TRIAMCINOLONE ACETONIDE 0.5 % EX OINT
TOPICAL_OINTMENT | Freq: Three times a day (TID) | CUTANEOUS | 0 refills | Status: DC
  Filled 2023-02-10: qty 30, 30d supply, fill #0

## 2023-02-10 MED ORDER — DOXYCYCLINE HYCLATE 100 MG PO TABS
100.0000 mg | ORAL_TABLET | Freq: Two times a day (BID) | ORAL | 0 refills | Status: AC
  Filled 2023-02-10: qty 9, 5d supply, fill #0

## 2023-02-10 MED ORDER — TRIAMCINOLONE ACETONIDE 0.5 % EX CREA
TOPICAL_CREAM | Freq: Three times a day (TID) | CUTANEOUS | Status: DC
  Filled 2023-02-10: qty 15

## 2023-02-10 MED ORDER — CEFADROXIL 500 MG PO CAPS
500.0000 mg | ORAL_CAPSULE | Freq: Two times a day (BID) | ORAL | 0 refills | Status: AC
  Filled 2023-02-10: qty 9, 5d supply, fill #0

## 2023-02-10 MED ORDER — BISACODYL 10 MG RE SUPP
10.0000 mg | Freq: Once | RECTAL | Status: DC
  Filled 2023-02-10: qty 1

## 2023-02-10 MED ORDER — ACETAMINOPHEN 500 MG PO TABS
1000.0000 mg | ORAL_TABLET | Freq: Three times a day (TID) | ORAL | 0 refills | Status: DC | PRN
  Filled 2023-02-10: qty 30, 5d supply, fill #0

## 2023-02-10 NOTE — Discharge Instructions (Addendum)
 Mr. Hendrik, Donath were hospitalized for severe foot wounds became infected.  While here you were treated with antibiotics and wound care.  On discharge he will be sent home with additional antibiotics as well as materials to change wound dressings.  Please expect a phone call on Monday to set up an appointment with Dr. Trudy on Tuesday.  If you miss the phone call or you do not hear from the clinic by 2 PM, please call the clinic at [phone] to set up your appointment.  At home, plan to change your dressing on Sunday..  Before you replace your dressing, you can shower and or soak your foot with a warm wet towel.  When you replace the dressing do it like this: Place Curad Vaseline gauze over the pink/raw spots on your skin.  Then place Telfa over the Vaseline gauze.  Then use the square-shaped gauze pads to secure all of that in place.  Try to use that to cover the majority of your foot to improve comfort.  Try not to let the non-Vaseline gauze contact the open wounds.  Then used a long gauze to wrap the entire foot starting around the ankle and wrapping back-and-forth to also cover the heel and the foot.  You do not need to cover your toes.  Secure with tape.  Regarding antibiotics, you will be sent home on doxycycline  and cefadroxil .  You will start taking this tonight and take 1 pill every 12 hours until you are out of pills.  If you have any urgent concerns, you can call the clinic to reach a doctor at any time.

## 2023-02-10 NOTE — Progress Notes (Signed)
   02/10/23 1402  Mobility  Activity Ambulated with assistance in hallway  Level of Assistance Modified independent, requires aide device or extra time  Assistive Device Cane  Distance Ambulated (ft) 50 ft  Activity Response Tolerated fair  Mobility Referral Yes  Mobility visit 1 Mobility  Mobility Specialist Start Time (ACUTE ONLY) 1355  Mobility Specialist Stop Time (ACUTE ONLY) 1402  Mobility Specialist Time Calculation (min) (ACUTE ONLY) 7 min   Mobility Specialist: Progress Note  Pt agreeable to mobility session - received in chair. Required ModI using cane with post op boot on. C/o RLE pain rated 6/10 . Returned to chair with all needs met - call bell within reach.   William [name], BS Mobility Specialist Please contact via SecureChat or Rehab office at [phone].

## 2023-02-10 NOTE — Progress Notes (Signed)
 Orthopedic Tech Progress Note Patient Details:  ARMARION GREEK [birth year] [id number]  Post op shoe fitted to patient and removed per patients request.  Ortho Devices Type of Ortho Device: Postop shoe/boot Ortho Device/Splint Location: RLE Ortho Device/Splint Interventions: Ordered, Application, Removal, Adjustment   Post Interventions Patient Tolerated: Well Instructions Provided: Adjustment of device, Care of device  Bernarda JAYSON December 02/10/2023, 5:42 AM

## 2023-02-10 NOTE — Consult Note (Signed)
 Requested by secure chat some supplies for the pt.  Leave supplies on the patient`s room: - 5 units of Kerlix; - 5 units of Petrolatum gauze; - 5 units of Tefla (compress non adherent) - 1 tape.  Pt will be discharge soon, he will do the dressing at-home wound care.  WOC team will not plan to follow further.  Please reconsult if further assistance is needed. Thank-you,  Lela Holm BSN, RN, [employer], WOC  (Pager: [phone])

## 2023-02-10 NOTE — Discharge Summary (Addendum)
 Name: William [name] MRN: [id number] DOB: [birth year] 63 y.o. PCP: Harrie Bruckner, DO  Date of Admission: 02/05/2023  9:21 AM Date of Discharge: 02/10/2023 02/09/22 Attending Physician: Dr. Trudy  DISCHARGE DIAGNOSIS:  Primary Problem: Skin ulcers of foot, bilateral [facility])   Hospital Problems: Principal Problem:   Skin ulcers of foot, bilateral (HCC), R> L, painful Active Problems:   Cellulitis of right lower extremity   Eczematous dermatitis   DISCHARGE MEDICATIONS:   Allergies as of 02/10/2023   No Known Allergies      Medication List     STOP taking these medications    loratadine  10 MG tablet Commonly known as: CLARITIN    naltrexone  50 MG tablet Commonly known as: DEPADE   nicotine  polacrilex 2 MG gum Commonly known as: NICORETTE        TAKE these medications    Acetaminophen  Extra Strength 500 MG Tabs Take 2 tablets (1,000 mg total) by mouth every 8 (eight) hours as needed. Do not take if not in pain.   Biktarvy  50-200-25 MG Tabs tablet Generic drug: bictegravir-emtricitabine -tenofovir  AF Take 1 tablet by mouth daily.   cefadroxil  500 MG capsule Commonly known as: DURICEF Take 1 capsule (500 mg total) by mouth 2 (two) times daily for 9 doses. Start taking evening of 02/10/23.   doxycycline  100 MG tablet Commonly known as: VIBRA -TABS Take 1 tablet (100 mg total) by mouth every 12 (twelve) hours for 9 doses. Start on the evening on 1/3.   hydrochlorothiazide  25 MG tablet Commonly known as: HYDRODIURIL  Take 1 tablet (25 mg total) by mouth daily.   pantoprazole  40 MG tablet Commonly known as: PROTONIX  Take 1 tablet (40 mg total) by mouth daily.   rosuvastatin  10 MG tablet Commonly known as: Crestor  Take 1 tablet (10 mg total) by mouth daily.   triamcinolone  ointment 0.5 % Commonly known as: KENALOG  Apply topically 3 (three) times daily. What changed:  how much to take when to take this               Discharge Care Instructions   (From admission, onward)           Start     Ordered   02/10/23 0000  Discharge wound care:       Comments: See D/C instructions   02/10/23 1516   02/10/23 0000  Discharge wound care:       Comments: At home, plan to change your dressing on Sunday..  Before you replace your dressing, you can shower and or soak your foot with a warm wet towel.  When you replace the dressing do it like this: Place Curad Vaseline gauze over the pink/raw spots on your skin.  Then place Telfa over the Vaseline gauze.  Then use the square-shaped gauze pads to secure all of that in place.  Try to use that to cover the majority of your foot to improve comfort.  Try not to let the non-Vaseline gauze contact the open wounds.  Then used a long gauze to wrap the entire foot starting around the ankle and wrapping back-and-forth to also cover the heel and the foot.  You do not need to cover your toes.  Secure with tape.   02/10/23 1517            DISPOSITION AND FOLLOW-UP:  [name] was discharged from [facility] in stable condition. At the hospital follow up visit please address:  Follow-up Recommendations: Labs:  as indicated Studies: examine wound Medications:  Discharged on doxycycline  and Augmentin  to complete a 10-day course of antibiotics.  Follow-up Appointments: He will follow-up in the [facility] clinic on 1/7 for a wound check.  At that time please evaluate for improvement in wounds.  Please screen for worsening of wounds or rebound infection.  Assess pain control.  Please provide further wound care materials, if appropriate.  HOSPITAL COURSE:  Patient Summary: William [name] is a 63 y.o. male with pertinent PMH of HIV who presented with right foot wound and is admitted for right foot cellulitis.    Plan: Cellulitis of right foot with multiple ulcerations Patient presented with acute worsening of the right foot rash that has been present for 1 month.  He had severe  itchiness of the right foot for 1 month that led him to scratch excessively.  3 days before admission, he developed pain which made him bedridden, and on day before admission noticed the opening of multiple wounds on his right foot.  On exam demonstrated multiple ulcerations with purulence.  Most likely the cause was excessive scratching leading to skin breakdown and subsequent infection.  Fortunately there are no signs of a systemic infection, x-rays negative, there is no leukocytosis or fever.  He was admitted for treatment with IV antibiotics (Vancomycin  and ceftriaxone ) and wound care.  His wounds improved over the course of his 6 days in the hospital.  He was transitioned from IV to oral antibiotics (doxycycline  and cefadroxil ) on day 5 and tolerated this well.  Severe pain with dressing changes improved throughout the week as well.  At 1 point, we considered etiologies such as pyoderma gangrenosum due to the atypical appearance, that he improved with the above treatments and so did not feel that biopsy and steroids were necessary. -Vancomycin  per pharmacy and ceftriaxone  -> doxycycline  and cefadroxil  on day 5.  Discharged with doxycycline  and cefadroxil  to complete a 10-day course of antibiotics. -For pain, treated with oxycodone  5-10 q4 prn, senokot BID and miralax  qd for bowel regimen -Followed blood cultures - no growth at 5 days -Wound care: Rinse gently with saline. Cover raw ulcer beds with petrolatum gauze and non-stick telfa, wrapped in Kerlix.  Discharged with supplies for wound care.  Arrangements made for follow-up for wound assessment in the [facility] clinic on Tuesday 1/7.   HIV As of September, T cells 266, HIV load undetectable.  Continued Biktarvy .   HLD Continued rosuvastatin  10 daily.   Hypokalemia 3.3 on admission.  Replaced.  Magnesium  WNL.   Smoker 2 cigarettes/day times many years, will offer nicotine  patch.   DISCHARGE INSTRUCTIONS:   Discharge Instructions     AMB  referral to wound care center   Complete by: As directed    Call MD for:  severe uncontrolled pain   Complete by: As directed    Call MD for:  temperature >100.4   Complete by: As directed    Diet - low sodium heart healthy   Complete by: As directed    Discharge wound care:   Complete by: As directed    See D/C instructions   Discharge wound care:   Complete by: As directed    At home, plan to change your dressing on Sunday..  Before you replace your dressing, you can shower and or soak your foot with a warm wet towel.  When you replace the dressing do it like this: Place Curad Vaseline gauze over the pink/raw spots on your skin.  Then place Telfa over the Vaseline gauze.  Then  use the square-shaped gauze pads to secure all of that in place.  Try to use that to cover the majority of your foot to improve comfort.  Try not to let the non-Vaseline gauze contact the open wounds.  Then used a long gauze to wrap the entire foot starting around the ankle and wrapping back-and-forth to also cover the heel and the foot.  You do not need to cover your toes.  Secure with tape.   Increase activity slowly   Complete by: As directed    Increase activity slowly   Complete by: As directed        SUBJECTIVE:  Feeling well today.  No complaints or concerns.  Pain has reduced greatly.  Very satisfied with how his wounds are healing.  Agreeable to go home.  Feels that he can take care of his dressing changes on his own and has people at home to help him (mother). Discharge Vitals:   BP (!) 120/90 (BP Location: Right Arm)   Pulse 73   Temp (!) 97.5 F (36.4 C) (Oral)   Resp 18   Ht 5' 11 (1.803 m)   Wt 83.7 kg   SpO2 97%   BMI 25.74 kg/m   OBJECTIVE:  Physical Exam Constitutional:      General: He is not in acute distress.    Appearance: Normal appearance. He is not ill-appearing.  Cardiovascular:     Rate and Rhythm: Normal rate.     Pulses: Normal pulses.  Pulmonary:     Effort: Pulmonary  effort is normal.  Musculoskeletal:        General: Signs of injury present. There is no edema  Skin:    General: Skin is warm and dry.     Comments: There was diffuse eczematous dermatitis changes on the right foot seen initially with multiple shallow open wounds.  There are is a smaller area  localized on the dorsum of the distal L foot of thickened (eczematous appearance) with very small ulcerations. Eczema on interior bilateral arms. Exudate reduced since yesterday. No progression of ulceration. Some evidence of granulation tissue in ulcer beds.  Dressing changes are very painful but improved from prior days.  Neurological:     Mental Status: He is alert.  Psychiatric:        Mood and Affect: Mood normal.        Behavior: Behavior normal.     Pertinent Labs, Studies, and Procedures:     Latest Ref Rng & Units 02/06/2023    4:16 AM 02/05/2023    9:57 AM 10/27/2022    9:08 AM  CBC  WBC 4.0 - 10.5 K/uL 6.3  8.8  6.6   Hemoglobin 13.0 - 17.0 g/dL 87.7  85.5  86.1   Hematocrit 39.0 - 52.0 % 36.6  44.4  43.3   Platelets 150 - 400 K/uL 263  322  327        Latest Ref Rng & Units 02/08/2023    4:55 AM 02/07/2023    5:18 AM 02/06/2023    4:16 AM  CMP  Glucose 70 - 99 mg/dL 98  868  892   BUN 6 - 20 mg/dL 5  8  12    Creatinine 0.61 - 1.24 mg/dL 9.19  9.25  9.15   Sodium 135 - 145 mmol/L 139  137  136   Potassium 3.5 - 5.1 mmol/L 4.2  3.6  3.2   Chloride 98 - 111 mmol/L 106  105  103  CO2 22 - 32 mmol/L 27  24  23    Calcium  8.9 - 10.3 mg/dL 8.9  8.5  8.7     DG Foot Complete Right Result Date: 02/05/2023 CLINICAL DATA:  Wound EXAM: RIGHT FOOT COMPLETE - 3 VIEW COMPARISON:  None Available. FINDINGS: There is no evidence of fracture or dislocation. There is hallux valgus. Plantar calcaneal spur. No osteolytic or osteoblastic changes. No soft tissue abnormalities. IMPRESSION: No acute osseous abnormalities. Electronically Signed   By: Fonda Field M.D.   On: 02/05/2023 13:17      Signed: Lonni Africa, DO Internal Medicine Resident, PGY-1 [facility]  Pager: [phone] 2:56 PM, 02/12/2023

## 2023-02-12 DIAGNOSIS — L03115 Cellulitis of right lower limb: Principal | ICD-10-CM | POA: Diagnosis present

## 2023-02-13 ENCOUNTER — Telehealth: Payer: Self-pay | Admitting: Student

## 2023-02-13 ENCOUNTER — Telehealth: Payer: Self-pay

## 2023-02-13 NOTE — Transitions of Care (Post Inpatient/ED Visit) (Signed)
 02/13/2023  Name: William [name] MRN: [id number] DOB: 1961/01/19  Today's TOC FU Call Status: Today's TOC FU Call Status:: Successful TOC FU Call Completed TOC FU Call Complete Date: 02/13/23 Patient's Name and Date of Birth confirmed.  Transition Care Management Follow-up Telephone Call Date of Discharge: 02/10/23 Discharge Facility: [facility]) Type of Discharge: Inpatient Admission Primary Inpatient Discharge Diagnosis:: Cellulitis Right Lower Extremity and Left Foot Infection How have you been since you were released from the hospital?: Better Any questions or concerns?: No  Items Reviewed: Did you receive and understand the discharge instructions provided?: Yes Medications obtained,verified, and reconciled?: Yes (Medications Reviewed) Any new allergies since your discharge?: No Dietary orders reviewed?: Yes Type of Diet Ordered:: Low sodium Heart Healthy Do you have support at home?: Yes People in Home: parent(s) Name of Support/Comfort Primary Source: William [name] -Mother  Medications Reviewed Today: Medications Reviewed Today     Reviewed by Fabian Sober, RN (Case Manager) on 02/13/23 at 1055  Med List Status: <None>   Medication Order Taking? Sig Documenting Provider Last Dose Status Informant  acetaminophen  (TYLENOL ) 500 MG tablet [id number] Yes Take 2 tablets (1,000 mg total) by mouth every 8 (eight) hours as needed. Do not take if not in pain. Harrie Bruckner, DO Taking Active   bictegravir-emtricitabine -tenofovir  AF (BIKTARVY ) 50-200-25 MG TABS tablet [id number] Yes Take 1 tablet by mouth daily. Elaine Rush, MD Taking Active Self  cefadroxil  (DURICEF) 500 MG capsule [id number] Yes Take 1 capsule (500 mg total) by mouth 2 (two) times daily for 9 doses. Start taking evening of 02/10/23. Harrie Bruckner, DO Taking Active   doxycycline  (VIBRA -TABS) 100 MG tablet [id number] Yes Take 1 tablet (100 mg total) by mouth every 12 (twelve) hours for 9 doses. Start on  the evening on 1/3. Harrie Bruckner, DO Taking Active   hydrochlorothiazide  (HYDRODIURIL ) 25 MG tablet [id number] No Take 1 tablet (25 mg total) by mouth daily.  Patient not taking: Reported on 02/05/2023   Harrie Bruckner, DO Not Taking Active Self  pantoprazole  (PROTONIX ) 40 MG tablet [id number] No Take 1 tablet (40 mg total) by mouth daily.  Patient not taking: Reported on 02/05/2023   Harrie Bruckner, DO Not Taking Active Self  rosuvastatin  (CRESTOR ) 10 MG tablet [id number] No Take 1 tablet (10 mg total) by mouth daily.  Patient not taking: Reported on 02/05/2023   Harrie Bruckner, DO Not Taking Active Self  triamcinolone  ointment (KENALOG ) 0.5 % [id number] Yes Apply topically 3 (three) times daily. Harrie Bruckner, DO Taking Active             Home Care and Equipment/Supplies: Were Home Health Services Ordered?: Yes Name of Home Health Agency:: [facility] Has Agency set up a time to come to your home?: No (Patient has phone number for [facility] if he does not receive call today) Any new equipment or medical supplies ordered?: No Do you have any questions related to the use of the equipment/supplies?: No  Functional Questionnaire: Do you need assistance with bathing/showering or dressing?: No Do you need assistance with meal preparation?: No Do you need assistance with eating?: No Do you have difficulty maintaining continence: No Do you need assistance with getting out of bed/getting out of a chair/moving?: No Do you have difficulty managing or taking your medications?: No  Follow up appointments reviewed: PCP Follow-up appointment confirmed?: Yes Date of PCP follow-up appointment?: 02/14/23 Follow-up Provider: Dr. Trudy Specialist [facility] Follow-up appointment confirmed?: NA Do you need transportation to your follow-up appointment?: No Do you understand  care options if your condition(s) worsen?: Yes-patient verbalized understanding  SDOH Interventions Today     Flowsheet Row Most Recent Value  SDOH Interventions   Food Insecurity Interventions Intervention Not Indicated  Housing Interventions Intervention Not Indicated  Transportation Interventions Intervention Not Indicated  Utilities Interventions Intervention Not Indicated      Barnie Gowda RN, BSN, CCM RN Care Manager  Transitions of Care  [facility]  [phone]

## 2023-02-13 NOTE — Telephone Encounter (Signed)
 Please refer to message below.  Just spoke with patient and made him aware that he is scheduled for an appointment tomorrow 02/14/23 at 10:15 am.

## 2023-02-13 NOTE — Telephone Encounter (Signed)
-----   Message from Mliss Arlean Pouch sent at 02/12/2023 10:54 AM EST ----- Regarding: please schedule hospital f/u appt with me in my geri clinic 1/7 (I have no geri pts scheduled) Happy New Year! Please schedule William [name] in a 30 minute hospital f/u appt on Tuesday 1/7 in my geri clinic (I have no patients scheduled, there weren't other openings).   Reason for urgent appt is wound care. Thank you, JW

## 2023-02-14 ENCOUNTER — Ambulatory Visit: Payer: 59 | Admitting: Internal Medicine

## 2023-02-14 VITALS — BP 130/96 | HR 75 | Temp 97.4°F | Ht 71.0 in | Wt 184.7 lb

## 2023-02-14 DIAGNOSIS — L97519 Non-pressure chronic ulcer of other part of right foot with unspecified severity: Secondary | ICD-10-CM

## 2023-02-14 DIAGNOSIS — I10 Essential (primary) hypertension: Secondary | ICD-10-CM

## 2023-02-14 NOTE — Patient Instructions (Signed)
 Looking great, Mr. William [name], Day taken very good care of your foot and it's healing so well!  From now on, apply vaseline to the new and old skin to keep it moisturized and prevent it from feeling so stiff and tight.  You can use the vaseline gauze and cover with the Kerlix gauze just to keep it protected.  Eventually you will not need gauze any longer, though you'll have to keep your foot protected!  I'll have you check in with one of our doctors next week to see if it's ok to return back to work.  Take care,  Dr. Trudy

## 2023-02-14 NOTE — Progress Notes (Signed)
   63 y.o. William [name] is here for hospital f/u and wound recheck following admission to [facility] on the IM service 02/05/23-02/10/23 for management of severe R foot and ankle cellulitis (no sepsis) complicated by multiple painful ulcerations.  He was treated with IV antibiotics and wound care; opioid pain relief was necessary.  On the day of discharge his ulcers were no longer exudative and were beginning to fill in.   Wound care instructions included gentle cleanse/soak, pat dry, and apply non-medicated vaseline gauze topped with nonstick Tefla overlain with Kerlix to secure.  He was provided with a post op shoe.  Since discharge doing very well with no concerns.  He is pleased.  Completed antibiotics.  Pain has reduced considerably.    Patient Active Problem List   Diagnosis Date Noted   Cellulitis of right lower extremity 02/12/2023   Skin ulcers of foot, bilateral (HCC), R> L, painful 02/05/2023   Eczematous dermatitis 02/06/2023   Hyperlipidemia 09/01/2022   Dysphagia 01/12/2021   Gastritis 01/12/2021   Screening for colon cancer 05/25/2020   Hypertension 03/27/2019   GERD with esophagitis 09/01/2016   Cigarette smoker 12/23/2014   Seasonal allergies 01/22/2013   Human immunodeficiency virus (HIV) disease (HCC) 07/10/2006   Alcohol use disorder, moderate, dependence (HCC) 07/10/2006    Current Outpatient Medications:    acetaminophen  (TYLENOL ) 500 MG tablet, Take 2 tablets (1,000 mg total) by mouth every 8 (eight) hours as needed. Do not take if not in pain., Disp: 30 tablet, Rfl: 0   bictegravir-emtricitabine -tenofovir  AF (BIKTARVY ) 50-200-25 MG TABS tablet, Take 1 tablet by mouth daily., Disp: 30 tablet, Rfl: 11   cefadroxil  (DURICEF) 500 MG capsule, Take 1 capsule (500 mg total) by mouth 2 (two) times daily for 9 doses. Start taking evening of 02/10/23., Disp: 9 capsule, Rfl: 0   doxycycline  (VIBRA -TABS) 100 MG tablet, Take 1 tablet (100 mg total) by mouth every 12 (twelve) hours  for 9 doses. Start on the evening on 1/3., Disp: 9 tablet, Rfl: 0   hydrochlorothiazide  (HYDRODIURIL ) 25 MG tablet, Take 1 tablet (25 mg total) by mouth daily. (Patient not taking: Reported on 02/05/2023), Disp: 90 tablet, Rfl: 3   pantoprazole  (PROTONIX ) 40 MG tablet, Take 1 tablet (40 mg total) by mouth daily. (Patient not taking: Reported on 02/05/2023), Disp: 90 tablet, Rfl: 1   rosuvastatin  (CRESTOR ) 10 MG tablet, Take 1 tablet (10 mg total) by mouth daily. (Patient not taking: Reported on 02/05/2023), Disp: 90 tablet, Rfl: 4   triamcinolone  ointment (KENALOG ) 0.5 %, Apply topically 3 (three) times daily., Disp: 30 g, Rfl: 0     Objective BP (!) 130/96 (BP Location: Right Arm, Patient Position: Sitting, Cuff Size: Small)   Pulse 75   Temp (!) 97.4 F (36.3 C) (Oral)   Ht 5' 11 (1.803 m)   Wt 184 lb 11.2 oz (83.8 kg)   SpO2 98%   BMI 25.76 kg/m   Exam: Good spirits today, walked in without difficulty using post op shoe.  Dressing had been placed appropriately; removed to reveal markedly improved ulcers; completely epithelialized pink wound beds remain.  No further exudate.  Associated edema has completely resolved.  Skin is not tender to touch.  Assessment and Plan: There are no diagnoses linked to this encounter.   Return in about 1 week (around 02/21/2023) for foot ulcer inspection and return to work evaluation; any resident.  SABRA

## 2023-02-14 NOTE — Assessment & Plan Note (Addendum)
 Ulcers have epithelialized.  Will continue to encourage supple hydrated skin by application of Vaseline-today applied Vaseline gauze, going forward he can apply the Vaseline topically.  Antibiotics completed.  Walking okay with foot protected and postop shoe.  Advise he not return to work until a recheck next week in the [facility].  Newly epithelialized skin on dorsum of foot will be very vulnerable for period of time and it may be helpful to apply a cushion dressing under his sock simply for protection once he does return to work.  William [name] is very capable of managing the healing foot/ankle and endorsed understanding of instructions.SABRA

## 2023-02-15 ENCOUNTER — Other Ambulatory Visit: Payer: Self-pay

## 2023-02-15 NOTE — Assessment & Plan Note (Signed)
 130/96 today.  Elevated DBP not addressed today.

## 2023-02-20 ENCOUNTER — Other Ambulatory Visit: Payer: Self-pay

## 2023-02-21 ENCOUNTER — Encounter: Payer: Self-pay | Admitting: Student

## 2023-02-21 ENCOUNTER — Ambulatory Visit: Payer: 59 | Admitting: Student

## 2023-02-21 ENCOUNTER — Other Ambulatory Visit: Payer: Self-pay

## 2023-02-21 ENCOUNTER — Other Ambulatory Visit (HOSPITAL_COMMUNITY): Payer: Self-pay

## 2023-02-21 VITALS — BP 123/75 | HR 72 | Temp 98.0°F | Ht 71.0 in | Wt 187.8 lb

## 2023-02-21 DIAGNOSIS — L97529 Non-pressure chronic ulcer of other part of left foot with unspecified severity: Secondary | ICD-10-CM | POA: Diagnosis not present

## 2023-02-21 DIAGNOSIS — L97519 Non-pressure chronic ulcer of other part of right foot with unspecified severity: Secondary | ICD-10-CM | POA: Diagnosis not present

## 2023-02-21 MED ORDER — TRIAMCINOLONE ACETONIDE 0.5 % EX OINT
TOPICAL_OINTMENT | Freq: Three times a day (TID) | CUTANEOUS | 0 refills | Status: DC
  Filled 2023-02-21: qty 30, fill #0

## 2023-02-21 NOTE — Patient Instructions (Signed)
 Thank you, [name] for allowing us  to provide your care today. Today we discussed your healing foot wounds.    I have ordered the following labs for you:  Lab Orders  No laboratory test(s) ordered today     Tests ordered today:  None  Referrals ordered today:   Referral Orders  No referral(s) requested today     I have ordered the following medication/changed the following medications:   Stop the following medications: There are no discontinued medications.   Start the following medications: No orders of the defined types were placed in this encounter.    Follow up:  As needed     Remember:   - You received a work note today, please have your employer call us  if they have any questions.  - Please call us  to make an appointment if you notice any worsening in symptoms.   Should you have any questions or concerns please call the internal medicine clinic at [phone].     Lars Jeziorski Arellano Zameza, MD PGY-1 Internal Medicine Teaching [facility]

## 2023-02-21 NOTE — Assessment & Plan Note (Signed)
 Patient is here for wound re-check of the right foot and return to work evaluation. Patient has been following wound care instructions and has been taking cefadroxil  (completed) and doxycyline (1-2 doses left) as instructed. No complaints today. R foot showed healing ulcerations with reepithelialization and no concerns for bleeding, new infection, or new wound. R foot ROM normal. Denies any pain, states he is ready to return to work. Patient provided with gauze for wound care/comfort as needed, provided with work note. Plan:  - Finish remaining doxycycline  doses - Continue to follow wound care instructions - Return to clinic as needed

## 2023-02-21 NOTE — Progress Notes (Signed)
 CC: Wound re-check   HPI:  [name] is a 63 y.o. male living with a history stated below and presents today for wound re-check. Please see problem based assessment and plan for additional details.  Past Medical History:  Diagnosis Date   Allergy    Chronic hepatitis C without hepatic coma (HCC) 07/10/2006   His hepatitis C was treated and cured.      Folliculitis 06/02/2015   Grief 03/27/2019   HIV (human immunodeficiency virus infection) (HCC)    Hypertension    PNEUMOCYSTIS PNEUMONIA 07/10/2006   Qualifier: Diagnosis of  By: Janna MD, Burnard     Pneumothorax    Skin ulcers of foot, bilateral (HCC), R> L, painful 02/05/2023   Spontaneous pneumothorax 04/24/2013    Current Outpatient Medications on File Prior to Visit  Medication Sig Dispense Refill   acetaminophen  (TYLENOL ) 500 MG tablet Take 2 tablets (1,000 mg total) by mouth every 8 (eight) hours as needed. Do not take if not in pain. 30 tablet 0   bictegravir-emtricitabine -tenofovir  AF (BIKTARVY ) 50-200-25 MG TABS tablet Take 1 tablet by mouth daily. 30 tablet 11   hydrochlorothiazide  (HYDRODIURIL ) 25 MG tablet Take 1 tablet (25 mg total) by mouth daily. (Patient not taking: Reported on 02/05/2023) 90 tablet 3   pantoprazole  (PROTONIX ) 40 MG tablet Take 1 tablet (40 mg total) by mouth daily. (Patient not taking: Reported on 02/05/2023) 90 tablet 1   rosuvastatin  (CRESTOR ) 10 MG tablet Take 1 tablet (10 mg total) by mouth daily. (Patient not taking: Reported on 02/05/2023) 90 tablet 4   No current facility-administered medications on file prior to visit.    Family History  Adopted: Yes  Problem Relation Age of Onset   Heart attack Sister    Hypertension Maternal Aunt    Hypertension Other     Social History   Socioeconomic History   Marital status: Single    Spouse name: Not on file   Number of children: 0   Years of education: Not on file   Highest education level: 12th grade  Occupational  History   Not on file  Tobacco Use   Smoking status: Former    Current packs/day: 0.00    Average packs/day: 0.3 packs/day    Types: Cigarettes    Start date: 04/08/2018    Quit date: 04/09/2018    Years since quitting: 4.8    Passive exposure: Past   Smokeless tobacco: Never   Tobacco comments:    Using patches. STOPPED SMOKING WHEN STARTED PATCHES. Still chews Nicotene Gum. 11/10/2022  Vaping Use   Vaping status: Never Used  Substance and Sexual Activity   Alcohol use: Yes    Alcohol/week: 0.0 standard drinks of alcohol    Comment: daily   Drug use: Not Currently    Types: Cocaine   Sexual activity: Yes    Partners: Female    Comment: declined condoms  Other Topics Concern   Not on file  Social History Narrative   Current Social History 08/17/2020        Patient lives with three roommates in an home which is 1 story There are 4 steps up to the entrance the patient uses. There is a handrail on each side      Patient's method of transportation is city bus.      The highest level of education was high school diploma.      The patient currently disabled.      Identified important Relationships are: mother  Pets : none       Interests / Fun: go to the park and play basketball. Socialize       Current Stressors: I don't really have too much stress in my life       Religious / Personal Beliefs: Baptist       Other: I am just an outgoing person.       Social Drivers of Corporate Investment Banker Strain: Low Risk  (03/15/2022)   Overall Financial Resource Strain (CARDIA)    Difficulty of Paying Living Expenses: Not very hard  Food Insecurity: No Food Insecurity (02/13/2023)   Hunger Vital Sign    Worried About Running Out of Food in the Last Year: Never true    Ran Out of Food in the Last Year: Never true  Transportation Needs: No Transportation Needs (02/13/2023)   PRAPARE - Administrator, Civil Service (Medical): No    Lack of Transportation  (Non-Medical): No  Physical Activity: Insufficiently Active (03/15/2022)   Exercise Vital Sign    Days of Exercise per Week: 2 days    Minutes of Exercise per Session: 30 min  Stress: No Stress Concern Present (08/17/2020)   [name]    Feeling of Stress : Not at all  Social Connections: Socially Isolated (03/15/2022)   Social Connection and Isolation Panel [NHANES]    Frequency of Communication with Friends and Family: Twice a week    Frequency of Social Gatherings with Friends and Family: Never    Attends Religious Services: Never    Database Administrator or Organizations: No    Attends Banker Meetings: Never    Marital Status: Widowed  Intimate Partner Violence: Not At Risk (02/13/2023)   Humiliation, Afraid, Rape, and Kick questionnaire    Fear of Current or Ex-Partner: No    Emotionally Abused: No    Physically Abused: No    Sexually Abused: No    Review of Systems: ROS negative except for what is noted on the assessment and plan.  Vitals:   02/21/23 0945  BP: 123/75  Pulse: 72  Temp: 98 F (36.7 C)  TempSrc: Oral  SpO2: 97%  Weight: 187 lb 12.8 oz (85.2 kg)  Height: 5' 11 (1.803 m)   Physical Exam: Constitutional: well-appearing, sitting in no acute distress HENT: normocephalic atraumatic, mucous membranes moist Eyes: conjunctiva non-erythematous Pulmonary/Chest: normal work of breathing on room air Neurological: alert & oriented  MSK: R foot normal range of motion  Skin: warm and dry, R foot with healing ulcerations with reepithelialization, no signs of overt bleeding, no tenderness on palpation, no observable erythema or edema Psych: normal mood and behavior  Assessment & Plan:   Patient discussed with Dr. Forest  Skin ulcers of foot, bilateral (HCC), R> L, painful Patient is here for wound re-check of the right foot and return to work evaluation. Patient has been following  wound care instructions and has been taking cefadroxil  (completed) and doxycyline (1-2 doses left) as instructed. No complaints today. R foot showed healing ulcerations with reepithelialization and no concerns for bleeding, new infection, or new wound. R foot ROM normal. Denies any pain, states he is ready to return to work. Patient provided with gauze for wound care/comfort as needed, provided with work note. Plan:  - Finish remaining doxycycline  doses - Continue to follow wound care instructions - Return to clinic as needed   Celsey Asselin Arellano Zameza, MD [facility] Internal Medicine, PGY-1 Phone: [phone] Date 02/21/2023 Time 1:48 PM

## 2023-02-23 ENCOUNTER — Other Ambulatory Visit: Payer: Self-pay

## 2023-02-23 ENCOUNTER — Other Ambulatory Visit: Payer: Self-pay | Admitting: Pharmacist

## 2023-02-23 NOTE — Progress Notes (Signed)
Specialty Pharmacy Ongoing Clinical Assessment Note  William [name] is a 63 y.o. male who is being followed by the specialty pharmacy service for RxSp HIV   Patient's specialty medication(s) reviewed today: Bictegravir-Emtricitab-Tenofov (Biktarvy)   Missed doses in the last 4 weeks: 0   Patient/Caregiver did not have any additional questions or concerns.   Therapeutic benefit summary: Patient is achieving benefit   Adverse events/side effects summary: No adverse events/side effects   Patient's therapy is appropriate to: Continue    Goals Addressed             This Visit's Progress    Achieve Undetectable HIV Viral Load < 20       Patient is on track. Patient will maintain adherence      Comply with lab assessments       Patient is on track. Patient will adhere to provider and/or lab appointments      Increase CD4 count until steady state       Patient is on track. Patient will maintain adherence      Maintain optimal adherence to therapy       Patient is on track. Patient will maintain adherence         Follow up:  6 months  Jennette Kettle Specialty Pharmacist

## 2023-02-24 NOTE — Progress Notes (Signed)
 Internal Medicine Clinic Attending  Case discussed with the resident at the time of the visit.  We reviewed the resident's history and exam and pertinent patient test results.  I agree with the assessment, diagnosis, and plan of care documented in the resident's note.

## 2023-03-02 ENCOUNTER — Other Ambulatory Visit (HOSPITAL_COMMUNITY): Payer: Self-pay

## 2023-03-02 ENCOUNTER — Other Ambulatory Visit: Payer: Self-pay

## 2023-03-02 NOTE — Progress Notes (Signed)
Specialty Pharmacy Refill Coordination Note  William [name] is a 63 y.o. male contacted today regarding refills of specialty medication(s) Bictegravir-Emtricitab-Tenofov [name])   Patient requested Delivery   Delivery date: 03/06/23   Verified address: [address] [place] [address]   Medication will be filled on 03/03/23.

## 2023-03-03 ENCOUNTER — Other Ambulatory Visit (HOSPITAL_COMMUNITY): Payer: Self-pay

## 2023-03-03 ENCOUNTER — Other Ambulatory Visit: Payer: Self-pay

## 2023-03-22 ENCOUNTER — Other Ambulatory Visit: Payer: Self-pay

## 2023-03-27 ENCOUNTER — Other Ambulatory Visit: Payer: Self-pay

## 2023-04-12 ENCOUNTER — Other Ambulatory Visit: Payer: Self-pay

## 2023-04-12 ENCOUNTER — Other Ambulatory Visit (HOSPITAL_COMMUNITY): Payer: Self-pay

## 2023-04-12 NOTE — Progress Notes (Signed)
 Specialty Pharmacy Refill Coordination Note  William [name] is a 63 y.o. male contacted today regarding refills of specialty medication(s) Bictegravir-Emtricitab-Tenofov [name])   Patient requested Delivery   Delivery date: 04/14/23   Verified address: [address] [place] [address]   Medication will be filled on 04/13/23.

## 2023-04-13 ENCOUNTER — Other Ambulatory Visit: Payer: Self-pay

## 2023-05-02 ENCOUNTER — Ambulatory Visit: Payer: 59 | Admitting: Podiatry

## 2023-05-09 ENCOUNTER — Other Ambulatory Visit (HOSPITAL_COMMUNITY): Payer: Self-pay

## 2023-05-11 ENCOUNTER — Other Ambulatory Visit (HOSPITAL_COMMUNITY): Payer: Self-pay

## 2023-05-30 ENCOUNTER — Other Ambulatory Visit (HOSPITAL_COMMUNITY): Payer: Self-pay

## 2023-05-30 ENCOUNTER — Other Ambulatory Visit: Payer: Self-pay

## 2023-05-30 ENCOUNTER — Other Ambulatory Visit: Payer: Self-pay | Admitting: Internal Medicine

## 2023-05-30 DIAGNOSIS — B20 Human immunodeficiency virus [HIV] disease: Secondary | ICD-10-CM

## 2023-05-30 MED ORDER — BIKTARVY 50-200-25 MG PO TABS
1.0000 | ORAL_TABLET | Freq: Every day | ORAL | 2 refills | Status: DC
  Filled 2023-05-30 (×2): qty 30, 30d supply, fill #0

## 2023-05-30 NOTE — Progress Notes (Signed)
 Specialty Pharmacy Refill Coordination Note  [name] is a 63 y.o. male contacted today regarding refills of specialty medication(s) Bictegravir-Emtricitab-Tenofov (Biktarvy )   Patient requested Delivery   Delivery date: 05/31/23   Verified address: [address] [place], [place] [address]   Medication will be filled on 05/30/23.   This fill date is pending response to refill request from provider. Patient is aware and if they have not received fill by intended date, they must follow up with pharmacy.

## 2023-06-01 ENCOUNTER — Other Ambulatory Visit (HOSPITAL_COMMUNITY): Payer: Self-pay

## 2023-06-01 ENCOUNTER — Other Ambulatory Visit: Payer: Self-pay

## 2023-06-01 NOTE — Progress Notes (Signed)
 Patient contacted the pharmacy that the address provided for his delivery was incorrect. Medication was delivered to incorrect address on 05/31/23. Patent was able to recover their package. The correct address is: [address], [place], [place] [address].  Address has been updated in patients WAM profile.

## 2023-06-20 ENCOUNTER — Other Ambulatory Visit: Payer: Self-pay

## 2023-06-21 NOTE — Progress Notes (Signed)
 The 10-year ASCVD risk score (Arnett DK, et al., 2019) is: 12.1%   Values used to calculate the score:     Age: 63 years     Sex: Male     Is Non-Hispanic African American: Yes     Diabetic: No     Tobacco smoker: No     Systolic Blood Pressure: 123 mmHg     Is BP treated: Yes     HDL Cholesterol: 58 mg/dL     Total Cholesterol: 208 mg/dL  Currently prescribed rosuvastatin  10 mg.   Deyci Gesell, BSN, RN

## 2023-06-26 ENCOUNTER — Other Ambulatory Visit: Payer: Self-pay

## 2023-07-14 ENCOUNTER — Other Ambulatory Visit (HOSPITAL_COMMUNITY): Payer: Self-pay

## 2023-07-14 ENCOUNTER — Other Ambulatory Visit: Payer: Self-pay | Admitting: Pharmacy Technician

## 2023-07-14 ENCOUNTER — Encounter: Payer: Self-pay | Admitting: Internal Medicine

## 2023-07-14 ENCOUNTER — Other Ambulatory Visit: Payer: Self-pay

## 2023-07-14 ENCOUNTER — Ambulatory Visit (INDEPENDENT_AMBULATORY_CARE_PROVIDER_SITE_OTHER): Admitting: Internal Medicine

## 2023-07-14 VITALS — BP 118/86 | HR 66 | Temp 97.6°F | Wt 185.6 lb

## 2023-07-14 DIAGNOSIS — E785 Hyperlipidemia, unspecified: Secondary | ICD-10-CM

## 2023-07-14 DIAGNOSIS — K2100 Gastro-esophageal reflux disease with esophagitis, without bleeding: Secondary | ICD-10-CM

## 2023-07-14 DIAGNOSIS — Z0000 Encounter for general adult medical examination without abnormal findings: Secondary | ICD-10-CM | POA: Insufficient documentation

## 2023-07-14 DIAGNOSIS — L309 Dermatitis, unspecified: Secondary | ICD-10-CM

## 2023-07-14 DIAGNOSIS — B20 Human immunodeficiency virus [HIV] disease: Secondary | ICD-10-CM | POA: Diagnosis not present

## 2023-07-14 DIAGNOSIS — I10 Essential (primary) hypertension: Secondary | ICD-10-CM

## 2023-07-14 MED ORDER — TRIAMCINOLONE ACETONIDE 0.5 % EX OINT
TOPICAL_OINTMENT | Freq: Three times a day (TID) | CUTANEOUS | 0 refills | Status: DC
  Filled 2023-07-14: qty 30, 30d supply, fill #0
  Filled 2023-07-14: qty 30, fill #0

## 2023-07-14 MED ORDER — PANTOPRAZOLE SODIUM 40 MG PO TBEC
40.0000 mg | DELAYED_RELEASE_TABLET | Freq: Every day | ORAL | 1 refills | Status: DC
  Filled 2023-07-14 (×2): qty 90, 90d supply, fill #0

## 2023-07-14 MED ORDER — PANTOPRAZOLE SODIUM 40 MG PO TBEC
40.0000 mg | DELAYED_RELEASE_TABLET | Freq: Every day | ORAL | 1 refills | Status: DC
  Filled 2023-07-14: qty 90, 90d supply, fill #0

## 2023-07-14 MED ORDER — TRIAMCINOLONE ACETONIDE 0.5 % EX OINT
TOPICAL_OINTMENT | Freq: Three times a day (TID) | CUTANEOUS | 0 refills | Status: DC
  Filled 2023-07-14: qty 30, fill #0

## 2023-07-14 MED ORDER — BIKTARVY 50-200-25 MG PO TABS
1.0000 | ORAL_TABLET | Freq: Every day | ORAL | 2 refills | Status: DC
  Filled 2023-07-14 – 2023-08-02 (×3): qty 30, 30d supply, fill #0

## 2023-07-14 MED ORDER — BIKTARVY 50-200-25 MG PO TABS: 1.0000 | ORAL_TABLET | Freq: Every day | ORAL | 2 refills | Status: DC

## 2023-07-14 MED ORDER — ROSUVASTATIN CALCIUM 10 MG PO TABS
10.0000 mg | ORAL_TABLET | Freq: Every day | ORAL | 4 refills | Status: AC
  Filled 2023-07-14 (×2): qty 90, 90d supply, fill #0

## 2023-07-14 MED ORDER — ROSUVASTATIN CALCIUM 10 MG PO TABS
10.0000 mg | ORAL_TABLET | Freq: Every day | ORAL | 4 refills | Status: DC
Start: 2023-07-14 — End: 2023-07-14
  Filled 2023-07-14: qty 90, 90d supply, fill #0

## 2023-07-14 MED ORDER — HYDROCHLOROTHIAZIDE 25 MG PO TABS
25.0000 mg | ORAL_TABLET | Freq: Every day | ORAL | 3 refills | Status: DC
  Filled 2023-07-14 (×2): qty 90, 90d supply, fill #0

## 2023-07-14 MED ORDER — HYDROCHLOROTHIAZIDE 25 MG PO TABS
25.0000 mg | ORAL_TABLET | Freq: Every day | ORAL | 3 refills | Status: DC
  Filled 2023-07-14: qty 90, 90d supply, fill #0

## 2023-07-14 NOTE — Assessment & Plan Note (Addendum)
 Patient came with a form needing to be signed by a physician stating he can manage his own social security benefits. His mother passed away earlier this year and he hasn't been able to receive his own benefits since then. I completed the form stating I believe the patient is capable of handling all necessary finances.

## 2023-07-14 NOTE — Assessment & Plan Note (Signed)
 He has been adherent to his daily Biktarvy . Refill sent

## 2023-07-14 NOTE — Patient Instructions (Addendum)
 Mr William [name],   It was good to meet you today. I hope everything goes well with your social security benefits.  Your refills should be available at the [facility] pharmacy today.   Thanks,  Dr Esaw Heckler

## 2023-07-14 NOTE — Progress Notes (Signed)
 Specialty Pharmacy Refill Coordination Note  William [name] is a 63 y.o. male contacted today regarding refills of specialty medication(s) Bictegravir-Emtricitab-Tenofov (Biktarvy )   Patient requested [place] [facility] at [facility] [place] date: 07/14/23   Medication will be filled on 07/14/23.

## 2023-07-14 NOTE — Assessment & Plan Note (Signed)
 BP is 129/103, at goal. Refill for hydrochlorothiazide  sent

## 2023-07-14 NOTE — Assessment & Plan Note (Signed)
 Kenalog  cream refill sent

## 2023-07-14 NOTE — Progress Notes (Signed)
    Subjective:  CC: Medicaid form  HPI:  [name] is a 63 y.o. male with a past medical history stated below and presents today for above. Please see problem based assessment and plan for additional details.  Past Medical History:  Diagnosis Date   Allergy    Chronic hepatitis C without hepatic coma (HCC) 07/10/2006   His hepatitis C was treated and cured.      Folliculitis 06/02/2015   Grief 03/27/2019   HIV (human immunodeficiency virus infection) (HCC)    Hypertension    PNEUMOCYSTIS PNEUMONIA 07/10/2006   Qualifier: Diagnosis of  By: Ferrell Hu MD, Loetta Ringer     Pneumothorax    Skin ulcers of foot, bilateral (HCC), R> L, painful 02/05/2023   Spontaneous pneumothorax 04/24/2013    Current Outpatient Medications on File Prior to Visit  Medication Sig Dispense Refill   acetaminophen  (TYLENOL ) 500 MG tablet Take 2 tablets (1,000 mg total) by mouth every 8 (eight) hours as needed. Do not take if not in pain. 30 tablet 0   No current facility-administered medications on file prior to visit.   Review of Systems: ROS negative except for as is noted on the assessment and plan.  Objective:   Vitals:   07/14/23 0848  BP: (!) 129/103  Pulse: 66  Temp: 97.6 F (36.4 C)  TempSrc: Oral  SpO2: 98%  Weight: 185 lb 9.6 oz (84.2 kg)   Physical Exam: Constitutional: well-appearing, in no acute distress Cardiovascular: regular rate and rhythm Pulmonary/Chest: normal work of breathing on room air MSK: normal bulk and tone  Assessment & Plan:   Healthcare maintenance Patient came with a form needing to be signed by a physician stating he can manage his own social security benefits. His mother passed away earlier this year and he hasn't been able to receive his own benefits since then. I completed the form stating I believe the patient is capable of handling all necessary finances.   Eczematous dermatitis Kenalog  cream refill sent  Human immunodeficiency virus (HIV)  disease He has been adherent to his daily Biktarvy . Refill sent  Hypertension BP is 129/103, at goal. Refill for hydrochlorothiazide  sent    Patient discussed with Dr. Gaylon Kea MD [facility] Internal Medicine  PGY-1 Pager: [phone] Date 07/14/2023  Time 9:17 AM

## 2023-07-17 ENCOUNTER — Other Ambulatory Visit: Payer: Self-pay

## 2023-07-17 ENCOUNTER — Other Ambulatory Visit (HOSPITAL_COMMUNITY): Payer: Self-pay

## 2023-07-19 ENCOUNTER — Ambulatory Visit: Admitting: Internal Medicine

## 2023-07-19 ENCOUNTER — Other Ambulatory Visit (HOSPITAL_COMMUNITY): Payer: Self-pay

## 2023-07-19 ENCOUNTER — Other Ambulatory Visit: Payer: Self-pay

## 2023-07-19 NOTE — Addendum Note (Signed)
 Addended by: Bevelyn Bryant on: 07/19/2023 08:14 AM   Modules accepted: Level of Service

## 2023-07-19 NOTE — Progress Notes (Signed)
 Internal Medicine Clinic Attending  Case discussed with the resident at the time of the visit.  We reviewed the resident's history and exam and pertinent patient test results.  I agree with the assessment, diagnosis, and plan of care documented in the resident's note.

## 2023-07-21 ENCOUNTER — Other Ambulatory Visit (HOSPITAL_COMMUNITY): Payer: Self-pay

## 2023-07-24 ENCOUNTER — Other Ambulatory Visit (HOSPITAL_COMMUNITY): Payer: Self-pay

## 2023-07-24 NOTE — Progress Notes (Signed)
 LVM: RTS - Never Picked Up.

## 2023-07-28 ENCOUNTER — Other Ambulatory Visit (HOSPITAL_COMMUNITY): Payer: Self-pay

## 2023-07-31 ENCOUNTER — Other Ambulatory Visit: Payer: Self-pay

## 2023-08-02 ENCOUNTER — Other Ambulatory Visit: Payer: Self-pay

## 2023-08-02 NOTE — Progress Notes (Signed)
 Specialty Pharmacy Refill Coordination Note  William [name] is a 63 y.o. male contacted today regarding refills of specialty medication(s) Bictegravir-Emtricitab-Tenofov (Biktarvy )   Patient requested [place] at [facility] at [place] date: 08/04/23   Medication will be filled on 08/03/23.

## 2023-08-04 ENCOUNTER — Other Ambulatory Visit (HOSPITAL_COMMUNITY): Payer: Self-pay

## 2023-08-10 ENCOUNTER — Ambulatory Visit: Payer: Self-pay

## 2023-08-10 ENCOUNTER — Other Ambulatory Visit: Payer: Self-pay

## 2023-08-10 ENCOUNTER — Encounter: Payer: Self-pay | Admitting: Internal Medicine

## 2023-08-10 ENCOUNTER — Ambulatory Visit: Payer: 59 | Admitting: Internal Medicine

## 2023-08-10 VITALS — BP 121/81 | HR 87 | Temp 97.5°F | Wt 181.8 lb

## 2023-08-10 DIAGNOSIS — B20 Human immunodeficiency virus [HIV] disease: Secondary | ICD-10-CM | POA: Diagnosis not present

## 2023-08-10 DIAGNOSIS — L309 Dermatitis, unspecified: Secondary | ICD-10-CM

## 2023-08-10 MED ORDER — CLOBETASOL PROPIONATE 0.05 % EX OINT
1.0000 | TOPICAL_OINTMENT | Freq: Two times a day (BID) | CUTANEOUS | 5 refills | Status: DC
  Filled 2023-08-10: qty 30, 15d supply, fill #0

## 2023-08-10 MED ORDER — BICTEGRAVIR-EMTRICITAB-TENOFOV 50-200-25 MG PO TABS
1.0000 | ORAL_TABLET | Freq: Every day | ORAL | 11 refills | Status: DC
  Filled 2023-08-10 – 2023-10-24 (×5): qty 30, 30d supply, fill #0
  Filled 2023-11-15 – 2023-11-23 (×3): qty 30, 30d supply, fill #1
  Filled 2023-12-14 – 2023-12-21 (×3): qty 30, 30d supply, fill #2
  Filled 2024-01-16: qty 30, 30d supply, fill #3

## 2023-08-10 NOTE — Progress Notes (Signed)
 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   Skin ulcers of foot, bilateral (HCC), R> L, painful 02/05/2023   Hyperlipidemia 09/01/2022   Dysphagia 01/12/2021   Gastritis 01/12/2021   Screening for colon cancer 05/25/2020   Hypertension 03/27/2019   GERD with esophagitis 09/01/2016   Cigarette smoker 12/23/2014   Seasonal allergies 01/22/2013   Human immunodeficiency virus (HIV) disease (HCC) 07/10/2006   Alcohol use disorder, moderate, dependence (HCC) 07/10/2006    Patient's Medications  New Prescriptions   No medications on file  Previous Medications   ACETAMINOPHEN  (TYLENOL ) 500 MG TABLET    Take 2 tablets (1,000 mg total) by mouth every 8 (eight) hours as needed. Do not take if not in pain.   BICTEGRAVIR-EMTRICITABINE -TENOFOVIR  AF (BIKTARVY ) 50-200-25 MG TABS TABLET    Take 1 tablet by mouth daily.   HYDROCHLOROTHIAZIDE  (HYDRODIURIL ) 25 MG TABLET    Take 1 tablet (25 mg total) by mouth daily.   PANTOPRAZOLE  (PROTONIX ) 40 MG TABLET    Take 1 tablet (40 mg total) by mouth daily.Call office to schedule office visit for further refills   ROSUVASTATIN  (CRESTOR ) 10 MG TABLET    Take 1 tablet (10 mg total) by mouth daily.   TRIAMCINOLONE  OINTMENT (KENALOG ) 0.5 %    Apply topically 3 (three) times daily.  Modified Medications   No medications on file  Discontinued Medications   No medications on file    Subjective: William [name] is in for his routine HIV follow-up visit.  He denies any problems obtaining, taking or tolerating his Biktarvy .  He takes it each morning and does not recall missing doses.  He is feeling well.  11/10/22 id clinic f/u Patient previously saw dr Elaine. First visit with me today   08/10/23 id clinic f/u See a&p for detail     Review of Systems: ROS All other ros negative  Past Medical History:  Diagnosis Date    Allergy    Chronic hepatitis C without hepatic coma (HCC) 07/10/2006   His hepatitis C was treated and cured.      Folliculitis 06/02/2015   Grief 03/27/2019   HIV (human immunodeficiency virus infection) (HCC)    Hypertension    PNEUMOCYSTIS PNEUMONIA 07/10/2006   Qualifier: Diagnosis of  By: Janna MD, Burnard     Pneumothorax    Skin ulcers of foot, bilateral (HCC), R> L, painful 02/05/2023   Spontaneous pneumothorax 04/24/2013    Social History   Tobacco Use   Smoking status: Former    Current packs/day: 0.00    Average packs/day: 0.3 packs/day    Types: Cigarettes    Start date: 04/08/2018    Quit date: 04/09/2018    Years since quitting: 5.3    Passive exposure: Past   Smokeless tobacco: Never   Tobacco comments:    Using patches. STOPPED SMOKING WHEN STARTED PATCHES. Still chews Nicotene Gum. 11/10/2022  Vaping Use   Vaping status: Never Used  Substance Use Topics   Alcohol use: Yes    Alcohol/week: 0.0 standard drinks of alcohol    Comment: daily   Drug use: Not Currently    Types: Cocaine    Family History  Adopted: Yes  Problem Relation Age of Onset   Heart attack Sister    Hypertension Maternal Aunt    Hypertension Other     No Known  Allergies  Health Maintenance  Topic Date Due   Zoster Vaccines- Shingrix (1 of 2) Never done   Hepatitis B Vaccines (1 of 3 - Risk 3-dose series) Never done   COVID-19 Vaccine (4 - 2024-25 season) 10/09/2022   Pneumococcal Vaccine 64-68 Years old (4 of 4 - PCV20 or PCV21) 10/24/2022   Medicare Annual Wellness (AWV)  03/16/2023   INFLUENZA VACCINE  09/08/2023   Colonoscopy  03/10/2032   DTaP/Tdap/Td (2 - Td or Tdap) 03/15/2032   Hepatitis C Screening  Completed   HIV Screening  Completed   HPV VACCINES  Aged Out   Meningococcal B Vaccine  Aged Out    Objective:  Vitals:   08/10/23 1321  BP: 121/81  Pulse: 87  Temp: (!) 97.5 F (36.4 C)  TempSrc: Oral  SpO2: 94%  Weight: 181 lb 12.8 oz (82.5 kg)   Body  mass index is 25.36 kg/m.  Physical Exam Constitutional:      Comments: The spirits are good as usual.  Cardiovascular:     Rate and Rhythm: Normal rate and regular rhythm.     Heart sounds: No murmur heard. Pulmonary:     Effort: Pulmonary effort is normal.     Breath sounds: Normal breath sounds.  Psychiatric:        Mood and Affect: Mood normal.     Lab Results Lab Results  Component Value Date   WBC 6.3 02/06/2023   HGB 12.2 (L) 02/06/2023   HCT 36.6 (L) 02/06/2023   MCV 85.3 02/06/2023   PLT 263 02/06/2023    Lab Results  Component Value Date   CREATININE 0.80 02/08/2023   BUN 5 (L) 02/08/2023   NA 139 02/08/2023   K 4.2 02/08/2023   CL 106 02/08/2023   CO2 27 02/08/2023    Lab Results  Component Value Date   ALT 42 10/27/2022   AST 34 10/27/2022   ALKPHOS 69 11/04/2016   BILITOT 0.5 10/27/2022    Lab Results  Component Value Date   CHOL 208 (H) 05/11/2022   HDL 58 05/11/2022   LDLCALC 128 (H) 05/11/2022   TRIG 114 05/11/2022   CHOLHDL 3.6 05/11/2022   Lab Results  Component Value Date   LABRPR NON-REACTIVE 10/27/2022   RPRTITER 1:1 (H) 04/04/2017   HIV 1 RNA Quant (Copies/mL)  Date Value  10/27/2022 Not Detected  05/11/2022 <20 (H)  11/17/2021 <20 (H)   CD4 T Cell Abs (/uL)  Date Value  10/27/2022 266 (L)  05/11/2022 312 (L)  11/17/2021 290 (L)     Problem List Items Addressed This Visit   None   #hiv/aids #cad risk Dx'ed 2004; heterosexual risk Hx pjp pneumonia Therapy: Started treatment right away  2020-c biktarvy  2014-2020 genvoya  2010-2014 Truvada/isentress    Reviewed labs from 10/27/22 -- negative urine gc/chlam, rpr; hiv undetectable; cd4 266 (21% -- stable)   08/10/23 no missed biktarvy  last 4 weeks. Complains of skin ezcema flare; using triamcinolone  -- see below. Well controlled hiv   -continue crestor  10 mg daily -discussed u=u -encourage compliance -continue current HIV medication continue biktarvy  -labs  today -f/u in 9 months    #hx hep c s/p treatment 2016 and resolved    #rash -- eczema 08/10/23 around a year; diffuse. Exam plaque like changes no silvery scale diffuse on trunk/ext  -will try clobetasol ointment today; kenalog  cream might be too weak -dermatology referral    #social 08/2023 not sexually active; ask to defer std testing -lives alone -parents  and brother still alive (2 sisters had passed away) and live in [place] as well -retired/disability -using nicotine  gum for stopping smoking, still does 1 or 2 cigarette a day; 08/10/23 report --> quit smoking 2 months  -no other drug or alcohol -not sexually active/in relationship at this time    #reviewed other pmh Gerd Htn Tobacco use Hlp -- crestor  10 Hx of ?opioid dependence - naltrexone  (primary care doctor does that) He [facility] medicine [facility]    #hcm -vaccination Getting it with pcp Utd  -hepatitis Hx hep c 2011 hep b sAg negative 2007 hep b sAb positive -tb screening No epic record for quantiferon testing; will do next visit -cancer screening Defer to pcp for age appropriate cancer screening   Constance ONEIDA Passer, MD [facility] for Infectious [facility] [phone] pager   [phone] cell 08/10/2023, 1:36 PM

## 2023-08-10 NOTE — Patient Instructions (Signed)
 Rash: Stop triamcinolone  cream Try a stronger ointment (clobetasol) --> pick up today Referral to dermatology placed    Blood tests today     See me in 9 months    See your internal primary care doctor once a year to continue screening for prostate exam, routine colon cancer screening and also probably need lung cancer screening given your smoking history

## 2023-08-13 LAB — COMPLETE METABOLIC PANEL WITHOUT GFR
AG Ratio: 1.4 (calc) (ref 1.0–2.5)
ALT: 27 U/L (ref 9–46)
AST: 29 U/L (ref 10–35)
Albumin: 4.3 g/dL (ref 3.6–5.1)
Alkaline phosphatase (APISO): 75 U/L (ref 35–144)
BUN: 11 mg/dL (ref 7–25)
CO2: 27 mmol/L (ref 20–32)
Calcium: 9.2 mg/dL (ref 8.6–10.3)
Chloride: 104 mmol/L (ref 98–110)
Creat: 0.77 mg/dL (ref 0.70–1.35)
Globulin: 3.1 g/dL (ref 1.9–3.7)
Glucose, Bld: 96 mg/dL (ref 65–99)
Potassium: 3.6 mmol/L (ref 3.5–5.3)
Sodium: 140 mmol/L (ref 135–146)
Total Bilirubin: 0.3 mg/dL (ref 0.2–1.2)
Total Protein: 7.4 g/dL (ref 6.1–8.1)

## 2023-08-13 LAB — CBC
HCT: 43.7 % (ref 38.5–50.0)
Hemoglobin: 13.9 g/dL (ref 13.2–17.1)
MCH: 27.9 pg (ref 27.0–33.0)
MCHC: 31.8 g/dL — ABNORMAL LOW (ref 32.0–36.0)
MCV: 87.8 fL (ref 80.0–100.0)
MPV: 9.7 fL (ref 7.5–12.5)
Platelets: 313 Thousand/uL (ref 140–400)
RBC: 4.98 Million/uL (ref 4.20–5.80)
RDW: 13.2 % (ref 11.0–15.0)
WBC: 5.6 Thousand/uL (ref 3.8–10.8)

## 2023-08-13 LAB — HIV-1 RNA QUANT-NO REFLEX-BLD
HIV 1 RNA Quant: 2420 {copies}/mL — ABNORMAL HIGH
HIV-1 RNA Quant, Log: 3.38 {Log_copies}/mL — ABNORMAL HIGH

## 2023-08-14 ENCOUNTER — Ambulatory Visit: Payer: Self-pay | Admitting: Internal Medicine

## 2023-08-16 ENCOUNTER — Emergency Department (HOSPITAL_COMMUNITY)

## 2023-08-16 ENCOUNTER — Encounter (HOSPITAL_COMMUNITY): Payer: Self-pay | Admitting: Internal Medicine

## 2023-08-16 ENCOUNTER — Inpatient Hospital Stay (HOSPITAL_COMMUNITY)
Admission: EM | Admit: 2023-08-16 | Discharge: 2023-08-22 | DRG: 603 | Disposition: A | Discharge status: Home Health | Attending: Internal Medicine | Admitting: Internal Medicine

## 2023-08-16 ENCOUNTER — Other Ambulatory Visit: Payer: Self-pay

## 2023-08-16 DIAGNOSIS — Z91199 Patient's noncompliance with other medical treatment and regimen due to unspecified reason: Secondary | ICD-10-CM | POA: Diagnosis not present

## 2023-08-16 DIAGNOSIS — I77810 Thoracic aortic ectasia: Secondary | ICD-10-CM | POA: Diagnosis not present

## 2023-08-16 DIAGNOSIS — Z79899 Other long term (current) drug therapy: Secondary | ICD-10-CM

## 2023-08-16 DIAGNOSIS — M79661 Pain in right lower leg: Secondary | ICD-10-CM | POA: Diagnosis not present

## 2023-08-16 DIAGNOSIS — E785 Hyperlipidemia, unspecified: Secondary | ICD-10-CM | POA: Diagnosis not present

## 2023-08-16 DIAGNOSIS — B9561 Methicillin susceptible Staphylococcus aureus infection as the cause of diseases classified elsewhere: Secondary | ICD-10-CM | POA: Diagnosis present

## 2023-08-16 DIAGNOSIS — L97529 Non-pressure chronic ulcer of other part of left foot with unspecified severity: Secondary | ICD-10-CM | POA: Diagnosis not present

## 2023-08-16 DIAGNOSIS — L97519 Non-pressure chronic ulcer of other part of right foot with unspecified severity: Secondary | ICD-10-CM | POA: Diagnosis present

## 2023-08-16 DIAGNOSIS — L209 Atopic dermatitis, unspecified: Secondary | ICD-10-CM | POA: Diagnosis present

## 2023-08-16 DIAGNOSIS — Z87891 Personal history of nicotine dependence: Secondary | ICD-10-CM | POA: Diagnosis not present

## 2023-08-16 DIAGNOSIS — L03115 Cellulitis of right lower limb: Principal | ICD-10-CM | POA: Diagnosis present

## 2023-08-16 DIAGNOSIS — I77819 Aortic ectasia, unspecified site: Secondary | ICD-10-CM | POA: Insufficient documentation

## 2023-08-16 DIAGNOSIS — L309 Dermatitis, unspecified: Secondary | ICD-10-CM

## 2023-08-16 DIAGNOSIS — I38 Endocarditis, valve unspecified: Secondary | ICD-10-CM | POA: Diagnosis not present

## 2023-08-16 DIAGNOSIS — L97909 Non-pressure chronic ulcer of unspecified part of unspecified lower leg with unspecified severity: Secondary | ICD-10-CM | POA: Diagnosis not present

## 2023-08-16 DIAGNOSIS — R7881 Bacteremia: Secondary | ICD-10-CM | POA: Diagnosis not present

## 2023-08-16 DIAGNOSIS — B20 Human immunodeficiency virus [HIV] disease: Secondary | ICD-10-CM | POA: Diagnosis present

## 2023-08-16 DIAGNOSIS — Z5902 Unsheltered homelessness: Secondary | ICD-10-CM

## 2023-08-16 DIAGNOSIS — F1020 Alcohol dependence, uncomplicated: Secondary | ICD-10-CM | POA: Diagnosis present

## 2023-08-16 DIAGNOSIS — I10 Essential (primary) hypertension: Secondary | ICD-10-CM | POA: Diagnosis not present

## 2023-08-16 DIAGNOSIS — F17210 Nicotine dependence, cigarettes, uncomplicated: Secondary | ICD-10-CM | POA: Diagnosis not present

## 2023-08-16 DIAGNOSIS — I7090 Unspecified atherosclerosis: Secondary | ICD-10-CM | POA: Diagnosis not present

## 2023-08-16 DIAGNOSIS — L409 Psoriasis, unspecified: Secondary | ICD-10-CM | POA: Diagnosis not present

## 2023-08-16 DIAGNOSIS — I361 Nonrheumatic tricuspid (valve) insufficiency: Secondary | ICD-10-CM | POA: Diagnosis not present

## 2023-08-16 LAB — CBC WITH DIFFERENTIAL/PLATELET
Abs Immature Granulocytes: 0.02 K/uL (ref 0.00–0.07)
Basophils Absolute: 0.1 K/uL (ref 0.0–0.1)
Basophils Relative: 1 %
Eosinophils Absolute: 0.6 K/uL — ABNORMAL HIGH (ref 0.0–0.5)
Eosinophils Relative: 6 %
HCT: 38.6 % — ABNORMAL LOW (ref 39.0–52.0)
Hemoglobin: 12.5 g/dL — ABNORMAL LOW (ref 13.0–17.0)
Immature Granulocytes: 0 %
Lymphocytes Relative: 22 %
Lymphs Abs: 2 K/uL (ref 0.7–4.0)
MCH: 28.3 pg (ref 26.0–34.0)
MCHC: 32.4 g/dL (ref 30.0–36.0)
MCV: 87.3 fL (ref 80.0–100.0)
Monocytes Absolute: 1.2 K/uL — ABNORMAL HIGH (ref 0.1–1.0)
Monocytes Relative: 13 %
Neutro Abs: 5.3 K/uL (ref 1.7–7.7)
Neutrophils Relative %: 58 %
Platelets: 316 K/uL (ref 150–400)
RBC: 4.42 MIL/uL (ref 4.22–5.81)
RDW: 13.7 % (ref 11.5–15.5)
WBC: 9.2 K/uL (ref 4.0–10.5)
nRBC: 0 % (ref 0.0–0.2)

## 2023-08-16 LAB — PROTIME-INR
INR: 1.1 (ref 0.8–1.2)
Prothrombin Time: 15 s (ref 11.4–15.2)

## 2023-08-16 LAB — URINALYSIS, W/ REFLEX TO CULTURE (INFECTION SUSPECTED)
Bacteria, UA: NONE SEEN
Bilirubin Urine: NEGATIVE
Glucose, UA: NEGATIVE mg/dL
Hgb urine dipstick: NEGATIVE
Ketones, ur: NEGATIVE mg/dL
Leukocytes,Ua: NEGATIVE
Nitrite: NEGATIVE
Protein, ur: NEGATIVE mg/dL
Specific Gravity, Urine: 1.004 — ABNORMAL LOW (ref 1.005–1.030)
pH: 6 (ref 5.0–8.0)

## 2023-08-16 LAB — LIPID PANEL
Cholesterol: 180 mg/dL (ref 0–200)
HDL: 45 mg/dL (ref >40)
LDL Cholesterol: 122 mg/dL — ABNORMAL HIGH (ref 0–99)
Total CHOL/HDL Ratio: 4 ratio
Triglycerides: 63 mg/dL (ref <150)
VLDL: 13 mg/dL (ref 0–40)

## 2023-08-16 LAB — COMPREHENSIVE METABOLIC PANEL WITH GFR
ALT: 38 U/L (ref 0–44)
AST: 39 U/L (ref 15–41)
Albumin: 3.7 g/dL (ref 3.5–5.0)
Alkaline Phosphatase: 52 U/L (ref 38–126)
Anion gap: 11 (ref 5–15)
BUN: 9 mg/dL (ref 6–20)
CO2: 20 mmol/L — ABNORMAL LOW (ref 22–32)
Calcium: 8.6 mg/dL — ABNORMAL LOW (ref 8.9–10.3)
Chloride: 106 mmol/L (ref 98–111)
Creatinine, Ser: 0.84 mg/dL (ref 0.61–1.24)
GFR, Estimated: >60 mL/min (ref >60)
Glucose, Bld: 93 mg/dL (ref 70–99)
Potassium: 3.6 mmol/L (ref 3.5–5.1)
Sodium: 137 mmol/L (ref 135–145)
Total Bilirubin: 0.8 mg/dL (ref 0.0–1.2)
Total Protein: 7.3 g/dL (ref 6.5–8.1)

## 2023-08-16 LAB — I-STAT CG4 LACTIC ACID, ED: Lactic Acid, Venous: 0.7 mmol/L (ref 0.5–1.9)

## 2023-08-16 MED ORDER — ENOXAPARIN SODIUM 40 MG/0.4ML IJ SOSY
40.0000 mg | PREFILLED_SYRINGE | INTRAMUSCULAR | Status: DC
  Administered 2023-08-16 – 2023-08-22 (×7): 40 mg via SUBCUTANEOUS
  Filled 2023-08-16 (×7): qty 0.4

## 2023-08-16 MED ORDER — SILVER SULFADIAZINE 1 % EX CREA
TOPICAL_CREAM | CUTANEOUS | Status: AC
  Filled 2023-08-16: qty 85

## 2023-08-16 MED ORDER — BICTEGRAVIR-EMTRICITAB-TENOFOV 50-200-25 MG PO TABS
1.0000 | ORAL_TABLET | Freq: Every day | ORAL | Status: DC
  Administered 2023-08-17 – 2023-08-22 (×6): 1 via ORAL
  Filled 2023-08-16 (×7): qty 1

## 2023-08-16 MED ORDER — HYDROXYZINE HCL 25 MG PO TABS
25.0000 mg | ORAL_TABLET | Freq: Once | ORAL | Status: AC
  Administered 2023-08-16: 25 mg via ORAL
  Filled 2023-08-16: qty 1

## 2023-08-16 MED ORDER — CLOBETASOL PROPIONATE 0.05 % EX OINT
1.0000 | TOPICAL_OINTMENT | Freq: Two times a day (BID) | CUTANEOUS | Status: DC
  Administered 2023-08-16 – 2023-08-21 (×11): 1 via TOPICAL
  Filled 2023-08-16 (×2): qty 15

## 2023-08-16 MED ORDER — ACETAMINOPHEN 325 MG PO TABS
650.0000 mg | ORAL_TABLET | Freq: Four times a day (QID) | ORAL | Status: DC | PRN
Start: 2023-08-16 — End: 2023-08-22
  Administered 2023-08-16: 650 mg via ORAL
  Filled 2023-08-16: qty 2

## 2023-08-16 MED ORDER — VANCOMYCIN HCL 2000 MG/400ML IV SOLN
2000.0000 mg | Freq: Once | INTRAVENOUS | Status: AC
  Administered 2023-08-16: 2000 mg via INTRAVENOUS
  Filled 2023-08-16: qty 400

## 2023-08-16 MED ORDER — POLYETHYLENE GLYCOL 3350 17 G PO PACK: 17.0000 g | PACK | Freq: Every day | ORAL | Status: DC | PRN

## 2023-08-16 MED ORDER — HYDROXYZINE HCL 25 MG PO TABS
25.0000 mg | ORAL_TABLET | Freq: Two times a day (BID) | ORAL | Status: AC
  Administered 2023-08-16 – 2023-08-18 (×6): 25 mg via ORAL
  Filled 2023-08-16 (×6): qty 1

## 2023-08-16 MED ORDER — NICOTINE 7 MG/24HR TD PT24
7.0000 mg | MEDICATED_PATCH | Freq: Every day | TRANSDERMAL | Status: DC
  Administered 2023-08-16 – 2023-08-22 (×7): 7 mg via TRANSDERMAL
  Filled 2023-08-16 (×7): qty 1

## 2023-08-16 MED ORDER — ROSUVASTATIN CALCIUM 5 MG PO TABS: 10.0000 mg | ORAL_TABLET | Freq: Every day | ORAL | Status: DC

## 2023-08-16 MED ORDER — ACETAMINOPHEN 650 MG RE SUPP: 650.0000 mg | Freq: Four times a day (QID) | RECTAL | Status: DC | PRN

## 2023-08-16 MED ORDER — ROSUVASTATIN CALCIUM 5 MG PO TABS
10.0000 mg | ORAL_TABLET | Freq: Every day | ORAL | Status: DC
Start: 2023-08-17 — End: 2023-08-17
  Administered 2023-08-17: 10 mg via ORAL
  Filled 2023-08-16: qty 2

## 2023-08-16 NOTE — TOC CM/SW Note (Signed)
 Transition of Care [facility]) - Inpatient Brief Assessment   Patient Details  Name: William [name] MRN: [id number] Date of Birth: 12-Dec-1960  Transition of Care [facility]) CM/SW Contact:    Lauraine FORBES Saa, LCSW Phone Number: 08/16/2023, 1:48 PM   Clinical Narrative:  1:48 PM Per chart review, patient resides at home. Patient has a PCP and insurance. Patient does not have SNF history. Patient has HH history with [facility] and DME (cane) history with [facility]. Patient's preferred pharmacy's are [facility], [facility], and [facility]. No TOC needs were identified at this time. TOC will continue to follow and be available to assist.  Transition of Care Asessment: Insurance and Status: Insurance coverage has been reviewed Patient has primary care physician: Yes Home environment has been reviewed: Private Residence Prior level of function:: N/A Prior/Current Home Services: No current home services Social Drivers of Health Review: SDOH reviewed no interventions necessary Readmission risk has been reviewed: Yes Transition of care needs: no transition of care needs at this time

## 2023-08-16 NOTE — ED Notes (Signed)
 Patient left the floor in stable condition, with his belongings and staff.

## 2023-08-16 NOTE — ED Triage Notes (Signed)
 Pt. Stated, Ive had leg , feet with infection all over since May or June. Pt's legs red, inflamed and weeping fluid.

## 2023-08-16 NOTE — Progress Notes (Signed)
 ED Pharmacy Antibiotic Sign Off An antibiotic consult was received from an ED provider for vancomycin  per pharmacy dosing for cellulitis. A chart review was completed to assess appropriateness.   The following one time order(s) were placed:  Vancomycin  2g  Further antibiotic and/or antibiotic pharmacy consults should be ordered by the admitting provider if indicated.   Thank you for allowing pharmacy to be a part of this patient's care.   Leonor GORMAN Bash, [facility]  Clinical Pharmacist 08/16/23 9:53 AM

## 2023-08-16 NOTE — ED Provider Notes (Signed)
 [place] EMERGENCY DEPARTMENT AT [facility] Provider Note   CSN: [id number] Arrival date & time: 08/16/23  9274     Patient presents with: Leg Pain and Wound Infection   William [name] is a 63 y.o. male.   Patient is a 63 year old male with a history of HIV on Biktarvy , eczema with recurrent cellulitis, hypertension, hepatitis C who is presenting today due to worsening pain, itching and weeping of his right lower extremity and a little bit on his left.  He reports for months now he has had worsening rash but just in the last day or 2 he has noticed weeping of the right leg now with some pain.  He denies feeling systemically ill and reports that he no longer has the cream he was given in the past when he had to be admitted for cellulitis and December.  He reports that initially after the antibiotics and the cream and wrapping of his legs symptoms improved but is come back.  He did follow-up with his infectious disease doctor earlier this month and was found to have elevated viral loads in the 2000's.  No recent CD4 count.  He reports itching everywhere and having a rash in multiple locations but his biggest concern is his right lower leg.  Approximately a week ago he had the rash but no area of wound per infectious disease note.  The history is provided by the patient and medical records.  Leg Pain      Prior to Admission medications   Medication Sig Start Date End Date Taking? Authorizing Provider  acetaminophen  (TYLENOL ) 500 MG tablet Take 2 tablets (1,000 mg total) by mouth every 8 (eight) hours as needed. Do not take if not in pain. 02/10/23   Harrie Bruckner, DO  bictegravir-emtricitabine -tenofovir  AF (BIKTARVY ) 50-200-25 MG TABS tablet Take 1 tablet by mouth daily. 08/10/23   Vu, Constance T, MD  clobetasol  ointment (TEMOVATE ) 0.05 % Apply 1 Application topically 2 (two) times daily. 08/10/23   Vu, Constance T, MD  hydrochlorothiazide  (HYDRODIURIL ) 25 MG tablet Take 1 tablet (25  mg total) by mouth daily. 07/14/23   Francella Rogue, MD  pantoprazole  (PROTONIX ) 40 MG tablet Take 1 tablet (40 mg total) by mouth daily.Call office to schedule office visit for further refills 07/14/23   Francella Rogue, MD  rosuvastatin  (CRESTOR ) 10 MG tablet Take 1 tablet (10 mg total) by mouth daily. 07/14/23   Francella Rogue, MD  triamcinolone  ointment (KENALOG ) 0.5 % Apply topically 3 (three) times daily. 07/14/23   Francella Rogue, MD    Allergies: Patient has no known allergies.    Review of Systems  Updated Vital Signs BP (!) 122/95 (BP Location: Left Arm)   Pulse 87   Temp 97.8 F (36.6 C) (Oral)   Resp 16   SpO2 98%   Physical Exam Vitals and nursing note reviewed.  Constitutional:      General: He is not in acute distress.    Appearance: He is well-developed.  HENT:     Head: Normocephalic and atraumatic.  Eyes:     Conjunctiva/sclera: Conjunctivae normal.     Pupils: Pupils are equal, round, and reactive to light.  Cardiovascular:     Rate and Rhythm: Normal rate and regular rhythm.     Heart sounds: No murmur heard. Pulmonary:     Effort: Pulmonary effort is normal. No respiratory distress.     Breath sounds: Normal breath sounds. No wheezing or rales.  Abdominal:  General: There is no distension.     Palpations: Abdomen is soft.     Tenderness: There is no abdominal tenderness. There is no guarding or rebound.  Musculoskeletal:        General: Tenderness present. Normal range of motion.     Cervical back: Normal range of motion and neck supple.     Right lower leg: No edema.     Left lower leg: No edema.     Comments: Right lower leg from the knee down with open raw skin with purulent drainage and erythema.  Excoriated rash present.  Also small area on the dorsum of the left foot without significant drainage  Skin:    General: Skin is warm and dry.     Findings: Rash present. No erythema.     Comments: Excoriated, dry, flaky rash present on majority of the body most  localized on the upper and lower extremities but also around the neck.  Neurological:     Mental Status: He is alert and oriented to person, place, and time.  Psychiatric:        Behavior: Behavior normal.     (all labs ordered are listed, but only abnormal results are displayed) Labs Reviewed  COMPREHENSIVE METABOLIC PANEL WITH GFR - Abnormal; Notable for the following components:      Result Value   CO2 20 (*)    Calcium  8.6 (*)    All other components within normal limits  CBC WITH DIFFERENTIAL/PLATELET - Abnormal; Notable for the following components:   Hemoglobin 12.5 (*)    HCT 38.6 (*)    Monocytes Absolute 1.2 (*)    Eosinophils Absolute 0.6 (*)    All other components within normal limits  CULTURE, BLOOD (ROUTINE X 2)  CULTURE, BLOOD (ROUTINE X 2)  PROTIME-INR  URINALYSIS, W/ REFLEX TO CULTURE (INFECTION SUSPECTED)  I-STAT CG4 LACTIC ACID, ED    EKG: None  Radiology: DG Chest 2 View Result Date: 08/16/2023 CLINICAL DATA:  Leg infection. EXAM: CHEST - 2 VIEW COMPARISON:  07/23/2013. FINDINGS: The heart size and mediastinal contours are within normal limits. No focal consolidation, pleural effusion, or pneumothorax. Similar dextrocurvature the midthoracic spine. No acute osseous abnormality. IMPRESSION: No acute cardiopulmonary findings. Electronically Signed   By: Harrietta Sherry M.D.   On: 08/16/2023 08:53     Procedures   Medications Ordered in the ED  hydrOXYzine  (ATARAX ) tablet 25 mg (25 mg Oral Given 08/16/23 0848)                                    Medical Decision Making Amount and/or Complexity of Data Reviewed Labs: ordered. Decision-making details documented in ED Course. Radiology: ordered and independent interpretation performed. Decision-making details documented in ED Course.  Risk Prescription drug management. Decision regarding hospitalization.   Pt with multiple medical problems and comorbidities and presenting today with a complaint that  caries a high risk for morbidity and mortality. Patient presenting today with concern for secondary cellulitis from excessive itching from uncontrolled eczema.  Patient did require admission for cellulitis and something similar in December and did improve with antibiotics and wound care.  Seems that patient's symptoms have returned.  Also patient recently had viralcounts and his viral load is elevated as well but no recent CD4 count.  Patient does not appear systemically ill today.  Patient will need wound care and antibiotics plus or minus possible steroid cream  versus systemic steroids for his eczema.  Referral had already been made as an outpatient to dermatology.  I independently interpreted patient's labs and CBC, CMP, lactate are within normal limits.  I have independently visualized and interpreted pt's images today. Chest x-ray without acute findings.  Low suspicion for osteomyelitis at this time.  Patient sees the internal medicine clinic and will discuss the case with them.  Feel that patient will initially need admission for antibiotics and wound care and then can be de-escalated to oral antibiotics.  He does have a history of MRSA but no findings today concerning for necrotizing fasciitis.  Pt given vancomycin .  Consulted IM teaching service for evaluation and admission.  Pt agreeable to this plan.     Final diagnoses:  Cellulitis of right lower extremity  Eczema, unspecified type    ED Discharge Orders     None          Doretha Folks, MD 08/16/23 [phone]

## 2023-08-16 NOTE — Consult Note (Signed)
 WOC Nurse Consult Note: Reason for Consult: Consult requested for bilat legs. Performed remotely after review of progress notes and photos in the EMR.  Pt has generalized cellulitis and a rash of unknown origin and is on systemic antibiotics. Bilat legs with generalized erythremia and red patchy red rash with multiple full thickness wounds, red, moist and painful.  Dressing procedure/placement/frequency: Topical treatment orders provided for bedside nurses to perform as follows: Apply Silvadene  to bilat legs and feet Q day, then cover with ABD pads and kerlex.  Remove previous Silvadene  with moist gauze each time before applying more.  Please re-consult if further assistance is needed.  Thank-you,  Stephane Fought MSN, RN, CWOCN, [place], CNS [phone]

## 2023-08-16 NOTE — H&P (Cosign Needed Addendum)
 Date: 08/16/2023               Patient Name:  William [name] MRN: [id number]  DOB: 09/30/1960 Age / Sex: 63 y.o., male   PCP: Harrie Bruckner, DO         Medical Service: Internal Medicine Teaching Service         Attending Physician: Dr. MICAEL Riis Winfrey      First Contact: Sallyanne Primas, DO    Second Contact: Dr. Hadassah Kristy Ahr, MD          Pager Information: First Contact Pager: [phone]   Second Contact Pager: [phone]   SUBJECTIVE   Chief Complaint: Leg pain and wound infection.  History of Present Illness: William [name] is a 63 y.o. male with PMH of HIV on Biktarvy , atopic dermatitis, hyperlipidemia, who presented to the ED with right leg pain and wound infection.  The patient was recently hospitalized in December 2024 for right lower extremity cellulitis, which was attributed to chronic excoriation secondary to severe eczema. Over the past month, he has experienced worsening pruritus of the lower extremities, which he associates with xerosis and recent hot weather. For the past two days, he has noted serous drainage from the right lower extremity. This morning, he reported right leg pain and chills. He denies subjective fevers or purulent drainage from the wounds. The patient is followed by infectious diseases and was last seen on July 3rd. At that visit, laboratory evaluation revealed increased HIV viral loads, raising concern for nonadherence to his antiretroviral regimen (Biktarvy ). Clobetasol  cream was prescribed at that time for management of his eczema, but he has not yet obtained the medication.  ED Course: Labs:Normal CBC, and CMP. Imaging: Chest x-ray without any acute cardiopulmonary findings.  Past Medical History HIV on Biktarvy  Eczematous dermatitis Hyperlipidemia Hypertension  Past Surgical History Past Surgical History:  Procedure Laterality Date   CHEST TUBE INSERTION     ESOPHAGOGASTRODUODENOSCOPY (EGD) WITH PROPOFOL  N/A 08/29/2016    Procedure: ESOPHAGOGASTRODUODENOSCOPY (EGD) WITH PROPOFOL ;  Surgeon: Elicia Claw, MD;  Location: [facility];  Service: Gastroenterology;  Laterality: N/A;   MULTIPLE EXTRACTIONS WITH ALVEOLOPLASTY N/A 05/23/2014   Procedure: EXTRACTIONS OF ALL REMAINING TEETH WITH ALVEOLOPLASTY, REMOVAL OF LEFT OSSEOUS EXOTOSIS, REMOVAL MAXILLARY OSSEOUS TUBEROSITY;  Surgeon: Glendia Primrose, DDS;  Location: [facility] OR;  Service: Oral Surgery;  Laterality: N/A;   PLEURADESIS Left 04/26/2013   Procedure: PLEURADESIS;  Surgeon: Dorise MARLA Fellers, MD;  Location[facility] OR;  Service: Thoracic;  Laterality: Left;  Mechanical   STAPLING OF BLEBS Left 04/26/2013   Procedure: STAPLING OF BLEBS;  Surgeon: Dorise MARLA Fellers, MD;  Location: [facility] OR;  Service: Thoracic;  Laterality: Left;   VIDEO ASSISTED THORACOSCOPY Left 04/26/2013   Procedure: VIDEO ASSISTED THORACOSCOPY;  Surgeon: Dorise MARLA Fellers, MD;  Location: [facility] OR;  Service: Thoracic;  Laterality: Left;    Current Meds  Medication Sig   bictegravir-emtricitabine -tenofovir  AF (BIKTARVY ) 50-200-25 MG TABS tablet Take 1 tablet by mouth daily.     Social:  Currently unhomed, retired, independent of ADLs and IADLs.  Smokes 2-3 cigarettes/day.  Endorses daily alcohol use ( 6 packs/day, no alcohol for the past 2-3 weeks), since he does not have money.  Denies any recreational drug use.  Family History:  Family History  Adopted: Yes  Problem Relation Age of Onset   Heart attack Sister    Hypertension Maternal Aunt    Hypertension Other     Allergies: NKDA Allergies as of 08/16/2023   (  No Known Allergies)   Review of Systems: A complete ROS was negative except as per HPI.   OBJECTIVE:   Physical Exam: Blood pressure (!) 122/95, pulse 87, temperature 97.8 F (36.6 C), temperature source Oral, resp. rate 16, SpO2 98%.   Sitting in bed, NAD. Heart with regular rate and rhythm.  No murmurs. Clear bilateral lungs. Soft, nontender abdomen.  Normal bowel sounds. Lower  extremities with normal DP pulse. Upper extremities with dry flaky skin with excoriation and  lichenification most prominent around the arms, neck and the back. Right lower extremity with excoriated rash, open blisters associated with erythema, and purulent fluid.  Left Lower extremity has a patch of excoriated rash, without any signs of infection.  Both right and left foot nail deformities.   Labs: CBC    Component Value Date/Time   WBC 9.2 08/16/2023 0815   RBC 4.42 08/16/2023 0815   HGB 12.5 (L) 08/16/2023 0815   HCT 38.6 (L) 08/16/2023 0815   PLT 316 08/16/2023 0815   MCV 87.3 08/16/2023 0815   MCH 28.3 08/16/2023 0815   MCHC 32.4 08/16/2023 0815   RDW 13.7 08/16/2023 0815   LYMPHSABS 2.0 08/16/2023 0815   MONOABS 1.2 (H) 08/16/2023 0815   EOSABS 0.6 (H) 08/16/2023 0815   BASOSABS 0.1 08/16/2023 0815     CMP     Component Value Date/Time   NA 137 08/16/2023 0815   NA 143 10/26/2022 1605   K 3.6 08/16/2023 0815   CL 106 08/16/2023 0815   CO2 20 (L) 08/16/2023 0815   GLUCOSE 93 08/16/2023 0815   BUN 9 08/16/2023 0815   BUN 17 10/26/2022 1605   CREATININE 0.84 08/16/2023 0815   CREATININE 0.77 08/10/2023 1353   CALCIUM  8.6 (L) 08/16/2023 0815   PROT 7.3 08/16/2023 0815   ALBUMIN 3.7 08/16/2023 0815   AST 39 08/16/2023 0815   ALT 38 08/16/2023 0815   ALKPHOS 52 08/16/2023 0815   BILITOT 0.8 08/16/2023 0815   GFRNONAA >60 08/16/2023 0815   GFRNONAA 81 08/21/2012 0924   GFRAA >60 11/04/2016 1937   GFRAA >89 08/21/2012 0924    Imaging:  DG Chest 2 View Result Date: 08/16/2023 CLINICAL DATA:  Leg infection. EXAM: CHEST - 2 VIEW COMPARISON:  07/23/2013. FINDINGS: The heart size and mediastinal contours are within normal limits. No focal consolidation, pleural effusion, or pneumothorax. Similar dextrocurvature the midthoracic spine. No acute osseous abnormality. IMPRESSION: No acute cardiopulmonary findings. Electronically Signed   By: Harrietta Sherry M.D.   On:  08/16/2023 08:53    ASSESSMENT & PLAN:   Assessment & Plan by Problem: Principal Problem:   Cellulitis of right leg Active Problems:   Human immunodeficiency virus (HIV) disease (HCC)   Alcohol use disorder, moderate, dependence (HCC)   Skin ulcers of foot, bilateral (HCC), R> L, painful   Eczematous dermatitis   History of Present Illness: William [name] is a 63 y.o. male with PMH of HIV on Biktarvy , eczematous dermatitis, hyperlipidemia, who presented to the ED with right leg pain and wound infection, and admitted for cellulitis of the right leg.    # Right leg cellulitis # Severe atopic dermatitis Presenting with right leg pain, superficial blisters with erythema and draining purulent fluid. The clinical picture is consistent with a history of severe atopic dermatitis complicated by recurrent lower extremity cellulitis, with current symptoms  cellulitis recurrence. The patient's chronic pruritus and excoriations are recognized risk factors for recurrent skin and soft tissue infections, particularly in  the context of underlying immunosuppression due to HIV and possible nonadherence to antiretroviral therapy.  He is not systemically ill. No fevers or leukocytosis.  He has risk factors for MRSA including immunocomprised status, and prior history of MRSA, so was started on IV vancomycin  in the ED.  Plan: - wound care consult placed. - IV vancomycin  - Follow-up on blood cultures - hydroxyzine  for pruritus - could consider clobetasol  cream vs po steroid for the severe eczema flare   #HIV  Patient is HIV-positive on Biktarvy , followed by Dr. Overton (ID), last seen 08/10/2023. Previously undetectable viral load now increased to 2,420 copies/mL. CD4 in 2024: 261 cells/mm. Nonadherent per medication fill history;sometimes missing 1-2 months  Would benefit from 19-month Biktarvy  supply, but uninsured and limited to 30-day fills at [facility]. Plan: - Continue Biktarvy  - Check CD4  count.  #Hyperlipidemia. Not currently taking rosuvastatin .  His recent LDL check was in 2024; LDL 128. - Check lipid panel. - CW Crestor  10mg .  #Hypertension OP regimen includes hydrochlorothiazide  25 mg, of which he is not currently taking.  BP stable. Plan: Will hold off on BP meds for now.  #Alcohol use disorder Last alcohol was 2-3 weeks ago.  No signs of withdrawal, low concern for withdrawal  . #Cigarette smoking - Ordered nicotine  patch.  Best practice: Diet: Normal VTE: Enoxaparin  IVF: None,None Code: Full  Disposition planning: Prior to Admission Living Arrangement: Homeless Anticipated Discharge Location: Home Barriers to Discharge: Pending stabilization of the chronic wounds.  Dispo: Admit patient to Inpatient with expected length of stay greater than 2 midnights.  Signed: Celestina Czar, MD Internal Medicine Resident  08/16/2023, 10:39 AM  Please contact IM Residency On-Call Pager at: [phone] or [phone].

## 2023-08-17 ENCOUNTER — Inpatient Hospital Stay (HOSPITAL_COMMUNITY)

## 2023-08-17 DIAGNOSIS — L03115 Cellulitis of right lower limb: Principal | ICD-10-CM

## 2023-08-17 DIAGNOSIS — M79661 Pain in right lower leg: Secondary | ICD-10-CM

## 2023-08-17 DIAGNOSIS — B9561 Methicillin susceptible Staphylococcus aureus infection as the cause of diseases classified elsewhere: Secondary | ICD-10-CM

## 2023-08-17 DIAGNOSIS — I7090 Unspecified atherosclerosis: Secondary | ICD-10-CM | POA: Diagnosis not present

## 2023-08-17 DIAGNOSIS — F1020 Alcohol dependence, uncomplicated: Secondary | ICD-10-CM

## 2023-08-17 DIAGNOSIS — L409 Psoriasis, unspecified: Secondary | ICD-10-CM

## 2023-08-17 DIAGNOSIS — B20 Human immunodeficiency virus [HIV] disease: Secondary | ICD-10-CM

## 2023-08-17 DIAGNOSIS — R7881 Bacteremia: Secondary | ICD-10-CM

## 2023-08-17 LAB — BLOOD CULTURE ID PANEL (REFLEXED) - BCID2

## 2023-08-17 LAB — CBC
HCT: 38.3 % — ABNORMAL LOW (ref 39.0–52.0)
Hemoglobin: 12.4 g/dL — ABNORMAL LOW (ref 13.0–17.0)
MCH: 28.1 pg (ref 26.0–34.0)
MCHC: 32.4 g/dL (ref 30.0–36.0)
MCV: 86.8 fL (ref 80.0–100.0)
Platelets: 287 K/uL (ref 150–400)
RBC: 4.41 MIL/uL (ref 4.22–5.81)
RDW: 14 % (ref 11.5–15.5)
WBC: 6.3 K/uL (ref 4.0–10.5)
nRBC: 0 % (ref 0.0–0.2)

## 2023-08-17 LAB — BASIC METABOLIC PANEL WITH GFR
Anion gap: 8 (ref 5–15)
BUN: 6 mg/dL (ref 6–20)
CO2: 22 mmol/L (ref 22–32)
Calcium: 8.7 mg/dL — ABNORMAL LOW (ref 8.9–10.3)
Chloride: 108 mmol/L (ref 98–111)
Creatinine, Ser: 0.86 mg/dL (ref 0.61–1.24)
GFR, Estimated: >60 mL/min (ref >60)
Glucose, Bld: 106 mg/dL — ABNORMAL HIGH (ref 70–99)
Potassium: 3.7 mmol/L (ref 3.5–5.1)
Sodium: 138 mmol/L (ref 135–145)

## 2023-08-17 LAB — T-HELPER CELLS (CD4) COUNT (NOT AT ARMC)
CD4 % Helper T Cell: 15 % — ABNORMAL LOW (ref 33–65)
CD4 T Cell Abs: 288 /uL — ABNORMAL LOW (ref 400–1790)

## 2023-08-17 LAB — VAS US ABI WITH/WO TBI
Left ABI: 1.22
Right ABI: 1.39

## 2023-08-17 MED ORDER — ROSUVASTATIN CALCIUM 20 MG PO TABS: 20.0000 mg | ORAL_TABLET | Freq: Every day | ORAL | Status: DC

## 2023-08-17 MED ORDER — CEFAZOLIN SODIUM-DEXTROSE 2-4 GM/100ML-% IV SOLN
2.0000 g | Freq: Three times a day (TID) | INTRAVENOUS | Status: DC
  Administered 2023-08-17 – 2023-08-22 (×17): 2 g via INTRAVENOUS
  Filled 2023-08-17 (×16): qty 100

## 2023-08-17 MED ORDER — ROSUVASTATIN CALCIUM 5 MG PO TABS
10.0000 mg | ORAL_TABLET | Freq: Every day | ORAL | Status: DC
  Administered 2023-08-18 – 2023-08-22 (×5): 10 mg via ORAL
  Filled 2023-08-17 (×5): qty 2

## 2023-08-17 NOTE — Plan of Care (Signed)

## 2023-08-17 NOTE — Consult Note (Signed)
 Date of Admission:  08/16/2023          Reason for Consult: Methicillin sensitive Staphylococcus aureus bacteremia    Referring Provider: SHARYON auto consult and Elsie Savannah, MD   Assessment:  MSSA bacteremia: Likely seeded from his wounds related to his psoriasis HIV disease that is typically been well-controlled on Biktarvy  though recently viremic slightly Homeless Alcohol use disorder   Plan:  Continue cefazolin  Repeat blood cultures 2D echocardiogram and he is going to need a TEE Close observation of his lower extremities I do not think he has deep infection here HIV RNA  Continue Biktarvy  Standard universal precautions   Principal Problem:   Cellulitis of right leg Active Problems:   Human immunodeficiency virus (HIV) disease (HCC)   Alcohol use disorder, moderate, dependence (HCC)   Skin ulcers of foot, bilateral (HCC), R> L, painful   Eczematous dermatitis   Scheduled Meds:  bictegravir-emtricitabine -tenofovir  AF  1 tablet Oral Daily   clobetasol  ointment  1 Application Topical BID   enoxaparin  (LOVENOX ) injection  40 mg Subcutaneous Q24H   hydrOXYzine   25 mg Oral BID   nicotine   7 mg Transdermal Daily   [START ON 08/18/2023] rosuvastatin   10 mg Oral Daily   Continuous Infusions:   ceFAZolin  (ANCEF ) IV 2 g (08/17/23 1100)   PRN Meds:.acetaminophen  **OR** acetaminophen , polyethylene glycol  HPI: William [name] is a 63 y.o. male with typically well-controlled HIV and most recently on Biktarvy  previously followed by my partner Dr. Elaine and now followed by my partner Dr. Overton.  He also has comorbid hyperlipidemia and atopic dermatitis.  His he was more viremic on visit with Dr. Overton recently which I expect is due to missed doses of his BIKTARVY .  He was complaining of eczema that been going on already for the past month.  Prescribed topical steroids.  In the 2 days prior to admission however he started developing serous drainage from the  right lower extremity and then the leg pain and chills he was seen in the ER and ultimately admitted to the teaching service  Blood cultures drawn on admission are growing staph coccus aureus that is methicillin sensitive by [facility] ID in 1 of 2 cultures.  He has had ABIs done today that showed noncompressible vessels in the right ankle but compressible toe brachial indices.  He is now on cefazolin .  He needs a 2D echocardiogram and he will need a transesophageal echocardiogram at some point.  I am skeptical that he has a deep infection in his right lower extremity such as osteomyelitis or an abscess but we will continue to observe and consider more aggressive imaging such as an MRI.  At this point in time I think this is overkill   I will ensure that his HIV virus is being rechecked though I have seen falsely elevated viral loads in our hospital due to improper processing of specimen.  In the interim we will continue his Biktarvy   He currently does not have housing but says that he will build to stay with his brother.   I have personally spent 84 minutes involved in face-to-face and non-face-to-face activities for this patient on the day of the visit. Professional time spent includes the following activities: Preparing to see the patient (review of tests), Obtaining and/or reviewing separately obtained history (admission/discharge record), Performing a medically appropriate examination and/or evaluation , Ordering medications/tests/procedures, referring and communicating with other health care professionals, Documenting clinical information in the EMR, Independently  interpreting results (not separately reported), Communicating results to the patient/family/caregiver, Counseling and educating the patient/family/caregiver and Care coordination (not separately reported).   Evaluation of the patient requires complex antimicrobial therapy evaluation, counseling , isolation needs to reduce disease  transmission and risk assessment and mitigation.     Review of Systems: Review of Systems  Constitutional:  Positive for chills and fever. Negative for malaise/fatigue and weight loss.  HENT:  Negative for congestion and sore throat.   Eyes:  Negative for blurred vision and photophobia.  Respiratory:  Negative for cough, shortness of breath and wheezing.   Cardiovascular:  Negative for chest pain, palpitations and leg swelling.  Gastrointestinal:  Negative for abdominal pain, blood in stool, constipation, diarrhea, heartburn, melena, nausea and vomiting.  Genitourinary:  Negative for dysuria, flank pain and hematuria.  Musculoskeletal:  Positive for myalgias. Negative for back pain and falls.  Skin:  Negative for itching and rash.  Neurological:  Negative for dizziness, focal weakness, loss of consciousness, weakness and headaches.  Endo/Heme/Allergies:  Does not bruise/bleed easily.  Psychiatric/Behavioral:  Negative for depression and suicidal ideas. The patient does not have insomnia.     Past Medical History:  Diagnosis Date   Allergy    Chronic hepatitis C without hepatic coma (HCC) 07/10/2006   His hepatitis C was treated and cured.      Folliculitis 06/02/2015   Grief 03/27/2019   HIV (human immunodeficiency virus infection) (HCC)    Hypertension    PNEUMOCYSTIS PNEUMONIA 07/10/2006   Qualifier: Diagnosis of  By: Janna MD, Burnard     Pneumothorax    Skin ulcers of foot, bilateral (HCC), R> L, painful 02/05/2023   Spontaneous pneumothorax 04/24/2013    Social History   Tobacco Use   Smoking status: Former    Current packs/day: 0.00    Average packs/day: 0.3 packs/day    Types: Cigarettes    Start date: 04/08/2018    Quit date: 04/09/2018    Years since quitting: 5.3    Passive exposure: Past   Smokeless tobacco: Never   Tobacco comments:    Using patches. STOPPED SMOKING WHEN STARTED PATCHES. Still chews Nicotene Gum. 11/10/2022  Vaping Use   Vaping status: Never  Used  Substance Use Topics   Alcohol use: Yes    Alcohol/week: 0.0 standard drinks of alcohol    Comment: daily   Drug use: Not Currently    Types: Cocaine    Family History  Adopted: Yes  Problem Relation Age of Onset   Heart attack Sister    Hypertension Maternal Aunt    Hypertension Other    No Known Allergies  OBJECTIVE: Blood pressure 120/89, pulse 73, temperature 98 F (36.7 C), resp. rate 18, height 5' 11 (1.803 m), weight 83.9 kg, SpO2 97%.  Physical Exam Constitutional:      Appearance: He is well-developed.  HENT:     Head: Normocephalic and atraumatic.  Eyes:     Conjunctiva/sclera: Conjunctivae normal.  Cardiovascular:     Rate and Rhythm: Normal rate and regular rhythm.     Heart sounds: No murmur heard.    No friction rub. No gallop.  Pulmonary:     Effort: Pulmonary effort is normal. No respiratory distress.     Breath sounds: No stridor. No wheezing or rhonchi.  Abdominal:     General: There is no distension.     Palpations: Abdomen is soft.  Musculoskeletal:        General: Normal range of motion.  Cervical back: Normal range of motion and neck supple.  Skin:    General: Skin is warm and dry.     Coloration: Skin is not pale.     Findings: Erythema present. No rash.  Neurological:     General: No focal deficit present.     Mental Status: He is alert and oriented to person, place, and time.  Psychiatric:        Mood and Affect: Mood normal.        Behavior: Behavior normal.        Thought Content: Thought content normal.        Judgment: Judgment normal.    RLE       Lab Results Lab Results  Component Value Date   WBC 6.3 08/17/2023   HGB 12.4 (L) 08/17/2023   HCT 38.3 (L) 08/17/2023   MCV 86.8 08/17/2023   PLT 287 08/17/2023    Lab Results  Component Value Date   CREATININE 0.86 08/17/2023   BUN 6 08/17/2023   NA 138 08/17/2023   K 3.7 08/17/2023   CL 108 08/17/2023   CO2 22 08/17/2023    Lab Results  Component  Value Date   ALT 38 08/16/2023   AST 39 08/16/2023   ALKPHOS 52 08/16/2023   BILITOT 0.8 08/16/2023     Microbiology: Recent Results (from the past 240 hours)  Culture, blood (Routine x 2)     Status: Abnormal (Preliminary result)   Collection Time: 08/16/23  8:15 AM   Specimen: BLOOD RIGHT HAND  Result Value Ref Range Status   Specimen Description BLOOD RIGHT HAND  Final   Special Requests   Final    BOTTLES DRAWN AEROBIC ONLY Blood Culture results may not be optimal due to an inadequate volume of blood received in culture bottles   Culture  Setup Time   Final    GRAM POSITIVE COCCI AEROBIC BOTTLE ONLY CRITICAL RESULT CALLED TO, READ BACK BY AND VERIFIED WITH: PHARMD GREG ABBOTT [phone] [id number] BY JRS    Culture (A)  Final    STAPHYLOCOCCUS AUREUS TOO YOUNG TO READ Performed at [facility], [address], [place], [place] [address]    Report Status PENDING  Incomplete  Culture, blood (Routine x 2)     Status: None (Preliminary result)   Collection Time: 08/16/23  8:15 AM   Specimen: BLOOD RIGHT HAND  Result Value Ref Range Status   Specimen Description BLOOD RIGHT HAND  Final   Special Requests   Final    BOTTLES DRAWN AEROBIC AND ANAEROBIC Blood Culture adequate volume   Culture   Final    NO GROWTH < 24 HOURS Performed at [facility], [address] [place], [place] [address]    Report Status PENDING  Incomplete  Blood Culture ID Panel (Reflexed)     Status: Abnormal   Collection Time: 08/16/23  8:15 AM  Result Value Ref Range Status   Enterococcus faecalis NOT DETECTED NOT DETECTED Final   Enterococcus Faecium NOT DETECTED NOT DETECTED Final   Listeria monocytogenes NOT DETECTED NOT DETECTED Final   Staphylococcus species DETECTED (A) NOT DETECTED Final    Comment: CRITICAL RESULT CALLED TO, READ BACK BY AND VERIFIED WITH: PHARMD GREG ABBOTT [phone] [id number] BY JRS    Staphylococcus aureus (BCID) DETECTED (A) NOT DETECTED Final    Comment: CRITICAL  RESULT CALLED TO, READ BACK BY AND VERIFIED WITH: PHARMD GREG ABBOTT [phone] [id number] BY JRS  Staphylococcus epidermidis NOT DETECTED NOT DETECTED Final   Staphylococcus lugdunensis NOT DETECTED NOT DETECTED Final   Streptococcus species NOT DETECTED NOT DETECTED Final   Streptococcus agalactiae NOT DETECTED NOT DETECTED Final   Streptococcus pneumoniae NOT DETECTED NOT DETECTED Final   Streptococcus pyogenes NOT DETECTED NOT DETECTED Final   A.calcoaceticus-baumannii NOT DETECTED NOT DETECTED Final   Bacteroides fragilis NOT DETECTED NOT DETECTED Final   Enterobacterales NOT DETECTED NOT DETECTED Final   Enterobacter cloacae complex NOT DETECTED NOT DETECTED Final   Escherichia coli NOT DETECTED NOT DETECTED Final   Klebsiella aerogenes NOT DETECTED NOT DETECTED Final   Klebsiella oxytoca NOT DETECTED NOT DETECTED Final   Klebsiella pneumoniae NOT DETECTED NOT DETECTED Final   Proteus species NOT DETECTED NOT DETECTED Final   Salmonella species NOT DETECTED NOT DETECTED Final   Serratia marcescens NOT DETECTED NOT DETECTED Final   Haemophilus influenzae NOT DETECTED NOT DETECTED Final   Neisseria meningitidis NOT DETECTED NOT DETECTED Final   Pseudomonas aeruginosa NOT DETECTED NOT DETECTED Final   Stenotrophomonas maltophilia NOT DETECTED NOT DETECTED Final   Candida albicans NOT DETECTED NOT DETECTED Final   Candida auris NOT DETECTED NOT DETECTED Final   Candida glabrata NOT DETECTED NOT DETECTED Final   Candida krusei NOT DETECTED NOT DETECTED Final   Candida parapsilosis NOT DETECTED NOT DETECTED Final   Candida tropicalis NOT DETECTED NOT DETECTED Final   Cryptococcus neoformans/gattii NOT DETECTED NOT DETECTED Final   Meth resistant mecA/C and MREJ NOT DETECTED NOT DETECTED Final    Comment: Performed at [facility], [address], [place], [place] [address]    Jomarie Fleeta Rothman, MD [facility] [phone]  pager  08/17/2023, 5:49 PM

## 2023-08-17 NOTE — Progress Notes (Signed)
 PHARMACY - PHYSICIAN COMMUNICATION CRITICAL VALUE ALERT - BLOOD CULTURE IDENTIFICATION (BCID)  William [name] is an 63 y.o. male who presented to [facility] on 08/16/2023 with a chief complaint of LE cellulitis  Assessment: Blood culture growing MSSA  Name of physician (or Provider) Contacted:  Dr. Jolaine  Current antibiotics: None  Changes to prescribed antibiotics recommended:  Start Ancef  2 g IV q8h  Results for orders placed or performed during the hospital encounter of 08/16/23  Blood Culture ID Panel (Reflexed) (Collected: 08/16/2023  8:15 AM)  Result Value Ref Range   Enterococcus faecalis NOT DETECTED NOT DETECTED   Enterococcus Faecium NOT DETECTED NOT DETECTED   Listeria monocytogenes NOT DETECTED NOT DETECTED   Staphylococcus species DETECTED (A) NOT DETECTED   Staphylococcus aureus (BCID) DETECTED (A) NOT DETECTED   Staphylococcus epidermidis NOT DETECTED NOT DETECTED   Staphylococcus lugdunensis NOT DETECTED NOT DETECTED   Streptococcus species NOT DETECTED NOT DETECTED   Streptococcus agalactiae NOT DETECTED NOT DETECTED   Streptococcus pneumoniae NOT DETECTED NOT DETECTED   Streptococcus pyogenes NOT DETECTED NOT DETECTED   A.calcoaceticus-baumannii NOT DETECTED NOT DETECTED   Bacteroides fragilis NOT DETECTED NOT DETECTED   Enterobacterales NOT DETECTED NOT DETECTED   Enterobacter cloacae complex NOT DETECTED NOT DETECTED   Escherichia coli NOT DETECTED NOT DETECTED   Klebsiella aerogenes NOT DETECTED NOT DETECTED   Klebsiella oxytoca NOT DETECTED NOT DETECTED   Klebsiella pneumoniae NOT DETECTED NOT DETECTED   Proteus species NOT DETECTED NOT DETECTED   Salmonella species NOT DETECTED NOT DETECTED   Serratia marcescens NOT DETECTED NOT DETECTED   Haemophilus influenzae NOT DETECTED NOT DETECTED   Neisseria meningitidis NOT DETECTED NOT DETECTED   Pseudomonas aeruginosa NOT DETECTED NOT DETECTED   Stenotrophomonas maltophilia NOT DETECTED NOT DETECTED    Candida albicans NOT DETECTED NOT DETECTED   Candida auris NOT DETECTED NOT DETECTED   Candida glabrata NOT DETECTED NOT DETECTED   Candida krusei NOT DETECTED NOT DETECTED   Candida parapsilosis NOT DETECTED NOT DETECTED   Candida tropicalis NOT DETECTED NOT DETECTED   Cryptococcus neoformans/gattii NOT DETECTED NOT DETECTED   Meth resistant mecA/C and MREJ NOT DETECTED NOT DETECTED    Dail Cordella Misty 08/17/2023  1:44 AM

## 2023-08-17 NOTE — H&P (View-Only) (Signed)
 Date of Admission:  08/16/2023          Reason for Consult: Methicillin sensitive Staphylococcus aureus bacteremia    Referring Provider: SHARYON auto consult and Elsie Savannah, MD   Assessment:  MSSA bacteremia: Likely seeded from his wounds related to his psoriasis HIV disease that is typically been well-controlled on Biktarvy  though recently viremic slightly Homeless Alcohol use disorder   Plan:  Continue cefazolin  Repeat blood cultures 2D echocardiogram and he is going to need a TEE Close observation of his lower extremities I do not think he has deep infection here HIV RNA  Continue Biktarvy  Standard universal precautions   Principal Problem:   Cellulitis of right leg Active Problems:   Human immunodeficiency virus (HIV) disease (HCC)   Alcohol use disorder, moderate, dependence (HCC)   Skin ulcers of foot, bilateral (HCC), R> L, painful   Eczematous dermatitis   Scheduled Meds:  bictegravir-emtricitabine -tenofovir  AF  1 tablet Oral Daily   clobetasol  ointment  1 Application Topical BID   enoxaparin  (LOVENOX ) injection  40 mg Subcutaneous Q24H   hydrOXYzine   25 mg Oral BID   nicotine   7 mg Transdermal Daily   [START ON 08/18/2023] rosuvastatin   10 mg Oral Daily   Continuous Infusions:   ceFAZolin  (ANCEF ) IV 2 g (08/17/23 1100)   PRN Meds:.acetaminophen  **OR** acetaminophen , polyethylene glycol  HPI: William [name] is a 63 y.o. male with typically well-controlled HIV and most recently on Biktarvy  previously followed by my partner Dr. Elaine and now followed by my partner Dr. Overton.  He also has comorbid hyperlipidemia and atopic dermatitis.  His he was more viremic on visit with Dr. Overton recently which I expect is due to missed doses of his BIKTARVY .  He was complaining of eczema that been going on already for the past month.  Prescribed topical steroids.  In the 2 days prior to admission however he started developing serous drainage from the  right lower extremity and then the leg pain and chills he was seen in the ER and ultimately admitted to the teaching service  Blood cultures drawn on admission are growing staph coccus aureus that is methicillin sensitive by [facility] ID in 1 of 2 cultures.  He has had ABIs done today that showed noncompressible vessels in the right ankle but compressible toe brachial indices.  He is now on cefazolin .  He needs a 2D echocardiogram and he will need a transesophageal echocardiogram at some point.  I am skeptical that he has a deep infection in his right lower extremity such as osteomyelitis or an abscess but we will continue to observe and consider more aggressive imaging such as an MRI.  At this point in time I think this is overkill   I will ensure that his HIV virus is being rechecked though I have seen falsely elevated viral loads in our hospital due to improper processing of specimen.  In the interim we will continue his Biktarvy   He currently does not have housing but says that he will build to stay with his brother.   I have personally spent 84 minutes involved in face-to-face and non-face-to-face activities for this patient on the day of the visit. Professional time spent includes the following activities: Preparing to see the patient (review of tests), Obtaining and/or reviewing separately obtained history (admission/discharge record), Performing a medically appropriate examination and/or evaluation , Ordering medications/tests/procedures, referring and communicating with other health care professionals, Documenting clinical information in the EMR, Independently  interpreting results (not separately reported), Communicating results to the patient/family/caregiver, Counseling and educating the patient/family/caregiver and Care coordination (not separately reported).   Evaluation of the patient requires complex antimicrobial therapy evaluation, counseling , isolation needs to reduce disease  transmission and risk assessment and mitigation.     Review of Systems: Review of Systems  Constitutional:  Positive for chills and fever. Negative for malaise/fatigue and weight loss.  HENT:  Negative for congestion and sore throat.   Eyes:  Negative for blurred vision and photophobia.  Respiratory:  Negative for cough, shortness of breath and wheezing.   Cardiovascular:  Negative for chest pain, palpitations and leg swelling.  Gastrointestinal:  Negative for abdominal pain, blood in stool, constipation, diarrhea, heartburn, melena, nausea and vomiting.  Genitourinary:  Negative for dysuria, flank pain and hematuria.  Musculoskeletal:  Positive for myalgias. Negative for back pain and falls.  Skin:  Negative for itching and rash.  Neurological:  Negative for dizziness, focal weakness, loss of consciousness, weakness and headaches.  Endo/Heme/Allergies:  Does not bruise/bleed easily.  Psychiatric/Behavioral:  Negative for depression and suicidal ideas. The patient does not have insomnia.     Past Medical History:  Diagnosis Date   Allergy    Chronic hepatitis C without hepatic coma (HCC) 07/10/2006   His hepatitis C was treated and cured.      Folliculitis 06/02/2015   Grief 03/27/2019   HIV (human immunodeficiency virus infection) (HCC)    Hypertension    PNEUMOCYSTIS PNEUMONIA 07/10/2006   Qualifier: Diagnosis of  By: Janna MD, Burnard     Pneumothorax    Skin ulcers of foot, bilateral (HCC), R> L, painful 02/05/2023   Spontaneous pneumothorax 04/24/2013    Social History   Tobacco Use   Smoking status: Former    Current packs/day: 0.00    Average packs/day: 0.3 packs/day    Types: Cigarettes    Start date: 04/08/2018    Quit date: 04/09/2018    Years since quitting: 5.3    Passive exposure: Past   Smokeless tobacco: Never   Tobacco comments:    Using patches. STOPPED SMOKING WHEN STARTED PATCHES. Still chews Nicotene Gum. 11/10/2022  Vaping Use   Vaping status: Never  Used  Substance Use Topics   Alcohol use: Yes    Alcohol/week: 0.0 standard drinks of alcohol    Comment: daily   Drug use: Not Currently    Types: Cocaine    Family History  Adopted: Yes  Problem Relation Age of Onset   Heart attack Sister    Hypertension Maternal Aunt    Hypertension Other    No Known Allergies  OBJECTIVE: Blood pressure 120/89, pulse 73, temperature 98 F (36.7 C), resp. rate 18, height 5' 11 (1.803 m), weight 83.9 kg, SpO2 97%.  Physical Exam Constitutional:      Appearance: He is well-developed.  HENT:     Head: Normocephalic and atraumatic.  Eyes:     Conjunctiva/sclera: Conjunctivae normal.  Cardiovascular:     Rate and Rhythm: Normal rate and regular rhythm.     Heart sounds: No murmur heard.    No friction rub. No gallop.  Pulmonary:     Effort: Pulmonary effort is normal. No respiratory distress.     Breath sounds: No stridor. No wheezing or rhonchi.  Abdominal:     General: There is no distension.     Palpations: Abdomen is soft.  Musculoskeletal:        General: Normal range of motion.  Cervical back: Normal range of motion and neck supple.  Skin:    General: Skin is warm and dry.     Coloration: Skin is not pale.     Findings: Erythema present. No rash.  Neurological:     General: No focal deficit present.     Mental Status: He is alert and oriented to person, place, and time.  Psychiatric:        Mood and Affect: Mood normal.        Behavior: Behavior normal.        Thought Content: Thought content normal.        Judgment: Judgment normal.    RLE       Lab Results Lab Results  Component Value Date   WBC 6.3 08/17/2023   HGB 12.4 (L) 08/17/2023   HCT 38.3 (L) 08/17/2023   MCV 86.8 08/17/2023   PLT 287 08/17/2023    Lab Results  Component Value Date   CREATININE 0.86 08/17/2023   BUN 6 08/17/2023   NA 138 08/17/2023   K 3.7 08/17/2023   CL 108 08/17/2023   CO2 22 08/17/2023    Lab Results  Component  Value Date   ALT 38 08/16/2023   AST 39 08/16/2023   ALKPHOS 52 08/16/2023   BILITOT 0.8 08/16/2023     Microbiology: Recent Results (from the past 240 hours)  Culture, blood (Routine x 2)     Status: Abnormal (Preliminary result)   Collection Time: 08/16/23  8:15 AM   Specimen: BLOOD RIGHT HAND  Result Value Ref Range Status   Specimen Description BLOOD RIGHT HAND  Final   Special Requests   Final    BOTTLES DRAWN AEROBIC ONLY Blood Culture results may not be optimal due to an inadequate volume of blood received in culture bottles   Culture  Setup Time   Final    GRAM POSITIVE COCCI AEROBIC BOTTLE ONLY CRITICAL RESULT CALLED TO, READ BACK BY AND VERIFIED WITH: PHARMD GREG ABBOTT [id number] BY JRS    Culture (A)  Final    STAPHYLOCOCCUS AUREUS TOO YOUNG TO READ Performed at [facility], [address] [place], [place] [address]    Report Status PENDING  Incomplete  Culture, blood (Routine x 2)     Status: None (Preliminary result)   Collection Time: 08/16/23  8:15 AM   Specimen: BLOOD RIGHT HAND  Result Value Ref Range Status   Specimen Description BLOOD RIGHT HAND  Final   Special Requests   Final    BOTTLES DRAWN AEROBIC AND ANAEROBIC Blood Culture adequate volume   Culture   Final    NO GROWTH < 24 HOURS Performed at [facility], [address] [place], [place] [address]    Report Status PENDING  Incomplete  Blood Culture ID Panel (Reflexed)     Status: Abnormal   Collection Time: 08/16/23  8:15 AM  Result Value Ref Range Status   Enterococcus faecalis NOT DETECTED NOT DETECTED Final   Enterococcus Faecium NOT DETECTED NOT DETECTED Final   Listeria monocytogenes NOT DETECTED NOT DETECTED Final   Staphylococcus species DETECTED (A) NOT DETECTED Final    Comment: CRITICAL RESULT CALLED TO, READ BACK BY AND VERIFIED WITH: PHARMD GREG ABBOTT [id number] BY JRS    Staphylococcus aureus (BCID) DETECTED (A) NOT DETECTED Final    Comment: CRITICAL  RESULT CALLED TO, READ BACK BY AND VERIFIED WITH: PHARMD GREG ABBOTT [phone] [id number] BY JRS  Staphylococcus epidermidis NOT DETECTED NOT DETECTED Final   Staphylococcus lugdunensis NOT DETECTED NOT DETECTED Final   Streptococcus species NOT DETECTED NOT DETECTED Final   Streptococcus agalactiae NOT DETECTED NOT DETECTED Final   Streptococcus pneumoniae NOT DETECTED NOT DETECTED Final   Streptococcus pyogenes NOT DETECTED NOT DETECTED Final   A.calcoaceticus-baumannii NOT DETECTED NOT DETECTED Final   Bacteroides fragilis NOT DETECTED NOT DETECTED Final   Enterobacterales NOT DETECTED NOT DETECTED Final   Enterobacter cloacae complex NOT DETECTED NOT DETECTED Final   Escherichia coli NOT DETECTED NOT DETECTED Final   Klebsiella aerogenes NOT DETECTED NOT DETECTED Final   Klebsiella oxytoca NOT DETECTED NOT DETECTED Final   Klebsiella pneumoniae NOT DETECTED NOT DETECTED Final   Proteus species NOT DETECTED NOT DETECTED Final   Salmonella species NOT DETECTED NOT DETECTED Final   Serratia marcescens NOT DETECTED NOT DETECTED Final   Haemophilus influenzae NOT DETECTED NOT DETECTED Final   Neisseria meningitidis NOT DETECTED NOT DETECTED Final   Pseudomonas aeruginosa NOT DETECTED NOT DETECTED Final   Stenotrophomonas maltophilia NOT DETECTED NOT DETECTED Final   Candida albicans NOT DETECTED NOT DETECTED Final   Candida auris NOT DETECTED NOT DETECTED Final   Candida glabrata NOT DETECTED NOT DETECTED Final   Candida krusei NOT DETECTED NOT DETECTED Final   Candida parapsilosis NOT DETECTED NOT DETECTED Final   Candida tropicalis NOT DETECTED NOT DETECTED Final   Cryptococcus neoformans/gattii NOT DETECTED NOT DETECTED Final   Meth resistant mecA/C and MREJ NOT DETECTED NOT DETECTED Final    Comment: Performed at [facility], [address] [place], [place] [address]    Jomarie Fleeta Rothman, MD [facility] [phone]  pager  08/17/2023, 5:49 PM

## 2023-08-17 NOTE — Plan of Care (Signed)
  Problem: Education: Goal: Knowledge of General Education information will improve Description: Including pain rating scale, medication(s)/side effects and non-pharmacologic comfort measures 08/17/2023 2030 by Lang Saucier, RN Outcome: Progressing 08/17/2023 2030 by Lang Saucier, RN Outcome: Progressing   Problem: Health Behavior/Discharge Planning: Goal: Ability to manage health-related needs will improve 08/17/2023 2030 by Lang Saucier, RN Outcome: Progressing 08/17/2023 2030 by Lang Saucier, RN Outcome: Progressing   Problem: Clinical Measurements: Goal: Ability to maintain clinical measurements within normal limits will improve 08/17/2023 2030 by Lang Saucier, RN Outcome: Progressing 08/17/2023 2030 by Lang Saucier, RN Outcome: Progressing Goal: Will remain free from infection 08/17/2023 2030 by Lang Saucier, RN Outcome: Progressing 08/17/2023 2030 by Lang Saucier, RN Outcome: Progressing Goal: Diagnostic test results will improve 08/17/2023 2030 by Lang Saucier, RN Outcome: Progressing 08/17/2023 2030 by Lang Saucier, RN Outcome: Progressing Goal: Respiratory complications will improve 08/17/2023 2030 by Lang Saucier, RN Outcome: Progressing 08/17/2023 2030 by Lang Saucier, RN Outcome: Progressing Goal: Cardiovascular complication will be avoided 08/17/2023 2030 by Lang Saucier, RN Outcome: Progressing 08/17/2023 2030 by Lang Saucier, RN Outcome: Progressing   Problem: Activity: Goal: Risk for activity intolerance will decrease 08/17/2023 2030 by Lang Saucier, RN Outcome: Progressing 08/17/2023 2030 by Lang Saucier, RN Outcome: Progressing   Problem: Nutrition: Goal: Adequate nutrition will be maintained 08/17/2023 2030 by Lang Saucier, RN Outcome: Progressing 08/17/2023 2030 by Lang Saucier, RN Outcome: Progressing   Problem:  Coping: Goal: Level of anxiety will decrease 08/17/2023 2030 by Lang Saucier, RN Outcome: Progressing 08/17/2023 2030 by Lang Saucier, RN Outcome: Progressing   Problem: Elimination: Goal: Will not experience complications related to bowel motility 08/17/2023 2030 by Lang Saucier, RN Outcome: Progressing 08/17/2023 2030 by Lang Saucier, RN Outcome: Progressing Goal: Will not experience complications related to urinary retention 08/17/2023 2030 by Lang Saucier, RN Outcome: Progressing 08/17/2023 2030 by Lang Saucier, RN Outcome: Progressing   Problem: Pain Managment: Goal: General experience of comfort will improve and/or be controlled 08/17/2023 2030 by Lang Saucier, RN Outcome: Progressing 08/17/2023 2030 by Lang Saucier, RN Outcome: Progressing   Problem: Safety: Goal: Ability to remain free from injury will improve 08/17/2023 2030 by Lang Saucier, RN Outcome: Progressing 08/17/2023 2030 by Lang Saucier, RN Outcome: Progressing   Problem: Skin Integrity: Goal: Risk for impaired skin integrity will decrease 08/17/2023 2030 by Lang Saucier, RN Outcome: Progressing 08/17/2023 2030 by Lang Saucier, RN Outcome: Progressing

## 2023-08-17 NOTE — Progress Notes (Signed)
 HD#1 SUBJECTIVE:  Patient Summary: William [name] is a 63 y.o. with a pertinent PMH of HIV on Biktarvy , atopic dermatitis, hyperlipidemia, who presented with right leg pain and admitted for wound infection.   Overnight Events and Interim Events: Blood cultures came back positive MSSA. for no overnight events. At beside, patient denies pain.  OBJECTIVE:  Vital Signs: Vitals:   08/16/23 2017 08/16/23 2342 08/17/23 0442 08/17/23 0718  BP: 120/74 119/79 119/84 (!) 116/92  Pulse: 82 85 82 72  Resp: 16 18 15 18   Temp: 98.1 F (36.7 C) 98.1 F (36.7 C) 98 F (36.7 C) 98.2 F (36.8 C)  TempSrc: Oral Oral Oral Oral  SpO2: 94% 97% 95% 97%  Weight:      Height:       Supplemental O2: Room Air SpO2: 97 %  Filed Weights   08/16/23 1151  Weight: 83.9 kg     Intake/Output Summary (Last 24 hours) at 08/17/2023 1413 Last data filed at 08/17/2023 1100 Gross per 24 hour  Intake 100 ml  Output 1200 ml  Net -1100 ml   Net IO Since Admission: -2,286.55 mL [08/17/23 1413]  Physical Exam: Physical Exam Cardiovascular:     Rate and Rhythm: Normal rate and regular rhythm.     Heart sounds: Normal heart sounds.  Pulmonary:     Effort: Pulmonary effort is normal.     Breath sounds: Normal breath sounds.  Abdominal:     General: Abdomen is flat.     Palpations: Abdomen is soft.  Skin:    General: Skin is warm and dry.     Coloration: Skin is ashen.     Comments: RLE concentrically swollen compared to LLE Patient has RLL and LLL bandaged.   Neurological:     Mental Status: He is alert.     Patient Lines/Drains/Airways Status     Active Line/Drains/Airways     Name Placement date Placement time Site Days   Peripheral IV 08/16/23 22 G 1.75 Anterior;Left Forearm 08/16/23  1312  Forearm  1   Wound / Incision (Open or Dehisced) 02/06/23 Venous stasis ulcer Pretibial Left open areas r/t vascular disease 02/06/23  0013  Pretibial  192   Wound / Incision (Open or Dehisced)  02/05/23 Venous stasis ulcer Foot Anterior;Right dry flaky w/ one area thats pink w/ scab on top 02/05/23  2000  Foot  193   Wound 08/16/23 Other (Comment) Leg Left;Right 08/16/23  --  Leg  1            Pertinent labs and imaging:      Latest Ref Rng & Units 08/17/2023    5:32 AM 08/16/2023    8:15 AM 08/10/2023    1:53 PM  CBC  WBC 4.0 - 10.5 K/uL 6.3  9.2  5.6   Hemoglobin 13.0 - 17.0 g/dL 87.5  87.4  86.0   Hematocrit 39.0 - 52.0 % 38.3  38.6  43.7   Platelets 150 - 400 K/uL 287  316  313        Latest Ref Rng & Units 08/17/2023    5:32 AM 08/16/2023    8:15 AM 08/10/2023    1:53 PM  CMP  Glucose 70 - 99 mg/dL 893  93  96   BUN 6 - 20 mg/dL 6  9  11    Creatinine 0.61 - 1.24 mg/dL 9.13  9.15  9.22   Sodium 135 - 145 mmol/L 138  137  140   Potassium  3.5 - 5.1 mmol/L 3.7  3.6  3.6   Chloride 98 - 111 mmol/L 108  106  104   CO2 22 - 32 mmol/L 22  20  27    Calcium  8.9 - 10.3 mg/dL 8.7  8.6  9.2   Total Protein 6.5 - 8.1 g/dL  7.3  7.4   Total Bilirubin 0.0 - 1.2 mg/dL  0.8  0.3   Alkaline Phos 38 - 126 U/L  52    AST 15 - 41 U/L  39  29   ALT 0 - 44 U/L  38  27     No results found.  ASSESSMENT/PLAN:  Assessment: Principal Problem:   Cellulitis of right leg Active Problems:   Human immunodeficiency virus (HIV) disease (HCC)   Alcohol use disorder, moderate, dependence (HCC)   Skin ulcers of foot, bilateral (HCC), R> L, painful   Eczematous dermatitis  William [name] is a 63 y.o. with a pertinent PMH of HIV on Biktarvy , atopic dermatitis, hyperlipidemia, who presented with right leg pain and admitted for wound infection.   Plan: #Right Leg Cellulitis  #MSSA Bacteremia  #Eczematous dermatitis  #Right Lower extremity Swelling  Presented with Right Leg pain, superficial blisters with erythema, and draining purulent fluid. Patient has chronic pruritus and excoriations are recognized risk factors for recurrent skin and soft tissue infections Patient has not had fevers  or leukocytosis.  -wound care consulting -IV ancef  per ID for MSSA bacteremia and cellulitis  -Following blood cultures -clobetasol  ointment for pruritus  -RLE swelling - VAS US  LE and VAS US  ABI with/without TBI ordered for suspicion of DVT -TTE ordered  #HIV CD4 count 288, Not necessary to start any prophylactic therapy.  Patient also has history of possible non adherence to antiretroviral therapy. -Biktarvy   #Hyperlipidemia  LDL 122 -Rosuvastatin  10 mg  #Hypertension  Home regimen includes hydrochlorothiazide  25 mg, but patient is noncompliant per H&P. BP stable  -holding BP meds for now   #Alcohol Use Disorder  Last alcohol was 2-3 weeks ago. No signs of withdrawal, low concern for withdrawal  #Cigarette Smoking   -nicotine  patch   Best Practice: Diet: Regular diet VTE: enoxaparin  (LOVENOX ) injection 40 mg Start: 08/16/23 1045 Code: Full  Disposition planning: disposition pending follow up bacteremia cultures  Signature:  Marshell Rieger [facility]  2:13 PM, 08/17/2023  On Call pager [phone]

## 2023-08-17 NOTE — Progress Notes (Signed)
 VASCULAR LAB    Right lower extremity venous duplex has been performed.  See CV proc for preliminary results.   Indra Wolters, RVT 08/17/2023, 3:29 PM

## 2023-08-17 NOTE — Progress Notes (Signed)
 VASCULAR LAB    ABI has been performed.  See CV proc for preliminary results.   Jearlean Demauro, RVT 08/17/2023, 3:26 PM

## 2023-08-18 ENCOUNTER — Inpatient Hospital Stay (HOSPITAL_COMMUNITY)

## 2023-08-18 DIAGNOSIS — I38 Endocarditis, valve unspecified: Secondary | ICD-10-CM

## 2023-08-18 DIAGNOSIS — R7881 Bacteremia: Secondary | ICD-10-CM

## 2023-08-18 DIAGNOSIS — B9561 Methicillin susceptible Staphylococcus aureus infection as the cause of diseases classified elsewhere: Secondary | ICD-10-CM

## 2023-08-18 DIAGNOSIS — I77819 Aortic ectasia, unspecified site: Secondary | ICD-10-CM | POA: Insufficient documentation

## 2023-08-18 HISTORY — DX: Bacteremia: R78.81

## 2023-08-18 LAB — ECHOCARDIOGRAM COMPLETE
AR max vel: 3.19 cm2
AV Area VTI: 3.4 cm2
AV Area mean vel: 3.12 cm2
AV Mean grad: 3 mmHg
AV Peak grad: 5.3 mmHg
Ao pk vel: 1.15 m/s
Area-P 1/2: 2.95 cm2
Height: 71 in
S' Lateral: 2.9 cm
Weight: 2960 [oz_av]

## 2023-08-18 LAB — CBC
HCT: 38.5 % — ABNORMAL LOW (ref 39.0–52.0)
Hemoglobin: 12.6 g/dL — ABNORMAL LOW (ref 13.0–17.0)
MCH: 28.6 pg (ref 26.0–34.0)
MCHC: 32.7 g/dL (ref 30.0–36.0)
MCV: 87.3 fL (ref 80.0–100.0)
Platelets: 280 K/uL (ref 150–400)
RBC: 4.41 MIL/uL (ref 4.22–5.81)
RDW: 13.9 % (ref 11.5–15.5)
WBC: 6 K/uL (ref 4.0–10.5)
nRBC: 0 % (ref 0.0–0.2)

## 2023-08-18 LAB — BASIC METABOLIC PANEL WITH GFR
Anion gap: 9 (ref 5–15)
BUN: 5 mg/dL — ABNORMAL LOW (ref 6–20)
CO2: 23 mmol/L (ref 22–32)
Calcium: 8.6 mg/dL — ABNORMAL LOW (ref 8.9–10.3)
Chloride: 107 mmol/L (ref 98–111)
Creatinine, Ser: 0.78 mg/dL (ref 0.61–1.24)
GFR, Estimated: >60 mL/min (ref >60)
Glucose, Bld: 89 mg/dL (ref 70–99)
Potassium: 3.8 mmol/L (ref 3.5–5.1)
Sodium: 139 mmol/L (ref 135–145)

## 2023-08-18 LAB — GLUCOSE, CAPILLARY: Glucose-Capillary: 95 mg/dL (ref 70–99)

## 2023-08-18 LAB — HIV-1 RNA QUANT-NO REFLEX-BLD
HIV 1 RNA Quant: 60 {copies}/mL
LOG10 HIV-1 RNA: 1.778 {Log_copies}/mL

## 2023-08-18 MED ORDER — SODIUM CHLORIDE 0.9% FLUSH: 3.0000 mL | INTRAVENOUS | Status: DC | PRN

## 2023-08-18 MED ORDER — MENTHOL 3 MG MT LOZG: 1.0000 | LOZENGE | OROMUCOSAL | Status: DC | PRN

## 2023-08-18 MED ORDER — SODIUM CHLORIDE 0.9% FLUSH
3.0000 mL | Freq: Two times a day (BID) | INTRAVENOUS | Status: DC
  Administered 2023-08-18: 3 mL via INTRAVENOUS
  Administered 2023-08-19: 5 mL via INTRAVENOUS
  Administered 2023-08-19 – 2023-08-20 (×3): 10 mL via INTRAVENOUS
  Administered 2023-08-21: 3 mL via INTRAVENOUS

## 2023-08-18 NOTE — Progress Notes (Signed)
   [facility] has been requested to perform a transesophageal echocardiogram on William [name] for bacteremia.    The patient does NOT have any absolute or relative contraindications to a Transesophageal Echocardiogram (TEE).  The patient has: No other conditions that may impact this procedure.    After careful review of history and examination, the risks and benefits of transesophageal echocardiogram have been explained including risks of esophageal damage, perforation (1:10,000 risk), bleeding, pharyngeal hematoma as well as other potential complications associated with conscious sedation including aspiration, arrhythmia, respiratory failure and death. Alternatives to treatment were discussed, questions were answered. Patient is willing to proceed.   Signed, Waddell DELENA Donath, PA-C  08/18/2023 3:51 PM

## 2023-08-18 NOTE — Evaluation (Signed)
 Physical Therapy Evaluation Patient Details Name: William [name] MRN: [id number] DOB: 09/25/1960 Today's Date: 08/18/2023  History of Present Illness  63 y.o. male presents to [facility] hospital on 08/16/2023 with RLE wound, blood cultures positive for MSSA. PMH includes: HIV, HTN, HLD, tobacco use, and pneumothorax.  Clinical Impression  Pt presents to PT with mild deficits in endurance and cardiopulmonary function. Pt is able to perform all mobility during session independently, but his SpO2 does drop to 89% with activity and PT notes increased work of breathing despite denial of DOE from the pt. Pt will benefit from frequent mobilization in an effort to improve activity tolerance. PT anticipates no post-acute PT needs.        If plan is discharge home, recommend the following:     Can travel by private vehicle        Equipment Recommendations None recommended by PT  Recommendations for Other Services       Functional Status Assessment Patient has had a recent decline in their functional status and demonstrates the ability to make significant improvements in function in a reasonable and predictable amount of time.     Precautions / Restrictions Precautions Precautions: Other (comment) Recall of Precautions/Restrictions: Intact Precaution/Restrictions Comments: monitor SpO2 Restrictions Weight Bearing Restrictions Per Provider Order: No      Mobility  Bed Mobility Overal bed mobility: Independent                  Transfers Overall transfer level: Independent Equipment used: None                    Ambulation/Gait Ambulation/Gait assistance: Modified independent (Device/Increase time) Gait Distance (Feet): 300 Feet Assistive device: None Gait Pattern/deviations: Step-through pattern, Wide base of support Gait velocity: reduced     General Gait Details: pt with steady step-through gait, widened BOS with reduced stance time bilaterally  Stairs             Wheelchair Mobility     Tilt Bed    Modified Rankin (Stroke Patients Only)       Balance Overall balance assessment: Needs assistance Sitting-balance support: No upper extremity supported, Feet supported Sitting balance-Leahy Scale: Good     Standing balance support: No upper extremity supported, During functional activity Standing balance-Leahy Scale: Good                               Pertinent Vitals/Pain Pain Assessment Pain Assessment: No/denies pain    Home Living Family/patient expects to be discharged to:: Private residence Living Arrangements: Other (Comment) (brother) Available Help at Discharge: Family;Available 24 hours/day Type of Home: House Home Access: Stairs to enter Entrance Stairs-Rails: Doctor, general practice of Steps: 4   Home Layout: One level Home Equipment: None      Prior Function Prior Level of Function : Independent/Modified Independent;Working/employed             Mobility Comments: pt reports he was working keeping up a house recently. ambulatory without DME       Extremity/Trunk Assessment   Upper Extremity Assessment Upper Extremity Assessment: Overall WFL for tasks assessed    Lower Extremity Assessment Lower Extremity Assessment: Generalized weakness    Cervical / Trunk Assessment Cervical / Trunk Assessment: Normal  Communication   Communication Communication: No apparent difficulties    Cognition Arousal: Alert Behavior During Therapy: WFL for tasks assessed/performed   PT - Cognitive impairments: No apparent  impairments                         Following commands: Intact       Cueing Cueing Techniques: Verbal cues     General Comments General comments (skin integrity, edema, etc.): pt SpO2 reading 89-90% with ambulation, work of breathing noted to increase some with ambulation although pt denies DOE. HR in low 100s    Exercises     Assessment/Plan    PT  Assessment Patient needs continued PT services  PT Problem List Cardiopulmonary status limiting activity       PT Treatment Interventions Gait training;Patient/family education;Therapeutic activities;Therapeutic exercise;Stair training    PT Goals (Current goals can be found in the Care Plan section)  Acute Rehab PT Goals Patient Stated Goal: to improve activity tolerance PT Goal Formulation: With patient Time For Goal Achievement: 09/01/23 Potential to Achieve Goals: Good Additional Goals Additional Goal #1: Pt will maintain SpO2 at or above 92% when ambulating for >250' to demonstrate improved cardiopulmonary function    Frequency Min 1X/week     Co-evaluation               AM-PAC PT 6 Clicks Mobility  Outcome Measure Help needed turning from your back to your side while in a flat bed without using bedrails?: None Help needed moving from lying on your back to sitting on the side of a flat bed without using bedrails?: None Help needed moving to and from a bed to a chair (including a wheelchair)?: None Help needed standing up from a chair using your arms (e.g., wheelchair or bedside chair)?: None Help needed to walk in hospital room?: None Help needed climbing 3-5 steps with a railing? : A Little 6 Click Score: 23    End of Session   Activity Tolerance: Patient tolerated treatment well Patient left: in bed;with call bell/phone within reach Nurse Communication: Mobility status PT Visit Diagnosis: Other abnormalities of gait and mobility (R26.89)    Time: 8280-8267 PT Time Calculation (min) (ACUTE ONLY): 13 min   Charges:   PT Evaluation $PT Eval Low Complexity: 1 Low   PT General Charges $$ ACUTE PT VISIT: 1 Visit         Bernardino JINNY Ruth, PT, DPT Acute Rehabilitation Office [phone]   Bernardino JINNY Ruth 08/18/2023, 6:05 PM

## 2023-08-18 NOTE — Progress Notes (Signed)
 HD#2 SUBJECTIVE:  Patient Summary: William [name] is a 63 y.o. with a pertinent PMH of HIV on Biktarvy , atopic dermatitis, hyperlipidemia, who presented with right leg pain and admitted for wound infection. Blood cultures + for MSSA.   Overnight Events and Interim Events: No overnight events. At beside, patient denies pain. Right leg swelling decreased from 7/10.   OBJECTIVE:  Vital Signs: Vitals:   08/17/23 1710 08/17/23 1951 08/18/23 0448 08/18/23 0829  BP: 120/89 118/80 131/89 114/86  Pulse: 73 81 (!) 59 74  Resp:  18 17 16   Temp: 98 F (36.7 C) 97.9 F (36.6 C) 97.7 F (36.5 C) (!) 97.5 F (36.4 C)  TempSrc:  Oral Oral Oral  SpO2: 97% 95% 98% 96%  Weight:      Height:       Supplemental O2: Room Air SpO2: 96 %  Filed Weights   08/16/23 1151  Weight: 83.9 kg     Intake/Output Summary (Last 24 hours) at 08/18/2023 1356 Last data filed at 08/18/2023 1200 Gross per 24 hour  Intake 118 ml  Output 4100 ml  Net -3982 ml   Net IO Since Admission: -6,268.55 mL [08/18/23 1356]  Physical Exam: Physical Exam Cardiovascular:     Rate and Rhythm: Normal rate and regular rhythm.     Heart sounds: Normal heart sounds.  Pulmonary:     Effort: Pulmonary effort is normal.     Breath sounds: Normal breath sounds.  Abdominal:     General: Abdomen is flat.     Palpations: Abdomen is soft.  Skin:    General: Skin is warm and dry.     Coloration: Skin is ashen.     Comments: RLE concentrically swollen compared to LLE, less swollen than 7/11 Patient has RLL and LLL bandaged.   Neurological:     Mental Status: He is alert.     Patient Lines/Drains/Airways Status     Active Line/Drains/Airways     Name Placement date Placement time Site Days   Peripheral IV 08/16/23 22 G 1.75 Anterior;Left Forearm 08/16/23  1312  Forearm  1   Wound / Incision (Open or Dehisced) 02/06/23 Venous stasis ulcer Pretibial Left open areas r/t vascular disease 02/06/23  0013  Pretibial  192    Wound / Incision (Open or Dehisced) 02/05/23 Venous stasis ulcer Foot Anterior;Right dry flaky w/ one area thats pink w/ scab on top 02/05/23  2000  Foot  193   Wound 08/16/23 Other (Comment) Leg Left;Right 08/16/23  --  Leg  1            Pertinent labs and imaging:      Latest Ref Rng & Units 08/18/2023    6:40 AM 08/17/2023    5:32 AM 08/16/2023    8:15 AM  CBC  WBC 4.0 - 10.5 K/uL 6.0  6.3  9.2   Hemoglobin 13.0 - 17.0 g/dL 87.3  87.5  87.4   Hematocrit 39.0 - 52.0 % 38.5  38.3  38.6   Platelets 150 - 400 K/uL 280  287  316        Latest Ref Rng & Units 08/18/2023    6:40 AM 08/17/2023    5:32 AM 08/16/2023    8:15 AM  CMP  Glucose 70 - 99 mg/dL 89  893  93   BUN 6 - 20 mg/dL 5  6  9    Creatinine 0.61 - 1.24 mg/dL 9.21  9.13  9.15   Sodium 135 -  145 mmol/L 139  138  137   Potassium 3.5 - 5.1 mmol/L 3.8  3.7  3.6   Chloride 98 - 111 mmol/L 107  108  106   CO2 22 - 32 mmol/L 23  22  20    Calcium  8.9 - 10.3 mg/dL 8.6  8.7  8.6   Total Protein 6.5 - 8.1 g/dL   7.3   Total Bilirubin 0.0 - 1.2 mg/dL   0.8   Alkaline Phos 38 - 126 U/L   52   AST 15 - 41 U/L   39   ALT 0 - 44 U/L   38     ECHOCARDIOGRAM COMPLETE Result Date: 08/18/2023    ECHOCARDIOGRAM REPORT   Patient Name:   William [name] Date of Exam: 08/18/2023 Medical Rec #:  [id number]         Height:       71.0 in Accession #:    [id number]        Weight:       185.0 lb Date of Birth:  [birth year]         BSA:          2.040 m Patient Age:    62 years          BP:           131/89 mmHg Patient Gender: M                 HR:           63 bpm. Exam Location:  Inpatient Procedure: 2D Echo, Color Doppler and Cardiac Doppler (Both Spectral and Color            Flow Doppler were utilized during procedure). Indications:    Endocarditis  History:        Patient has no prior history of Echocardiogram examinations.  Sonographer:    Benard Stallion Referring Phys: [id number] ELSIE KATHEE SAVANNAH IMPRESSIONS  1. Left ventricular ejection  fraction, by estimation, is 60 to 65%. The left ventricle has normal function. The left ventricle has no regional wall motion abnormalities. There is mild left ventricular hypertrophy. Left ventricular diastolic parameters were normal.  2. Right ventricular systolic function is normal. The right ventricular size is normal. There is normal pulmonary artery systolic pressure. The estimated right ventricular systolic pressure is 18.5 mmHg.  3. The mitral valve is normal in structure. Trivial mitral valve regurgitation. No evidence of mitral stenosis.  4. The aortic valve is tricuspid. Aortic valve regurgitation is not visualized. No aortic stenosis is present.  5. Aortic dilatation noted. There is dilatation of the ascending aorta, measuring 40 mm.  6. The inferior vena cava is normal in size with greater than 50% respiratory variability, suggesting right atrial pressure of 3 mmHg. Conclusion(s)/Recommendation(s): No evidence of valvular vegetations on this transthoracic echocardiogram. Consider a transesophageal echocardiogram to exclude infective endocarditis if clinically indicated. FINDINGS  Left Ventricle: Left ventricular ejection fraction, by estimation, is 60 to 65%. The left ventricle has normal function. The left ventricle has no regional wall motion abnormalities. The left ventricular internal cavity size was normal in size. There is  mild left ventricular hypertrophy. Left ventricular diastolic parameters were normal. Right Ventricle: The right ventricular size is normal. No increase in right ventricular wall thickness. Right ventricular systolic function is normal. There is normal pulmonary artery systolic pressure. The tricuspid regurgitant velocity is 1.97 m/s, and  with an assumed right atrial pressure of 3 mmHg, the estimated right  ventricular systolic pressure is 18.5 mmHg. Left Atrium: Left atrial size was normal in size. Right Atrium: Right atrial size was normal in size. Pericardium: There is no  evidence of pericardial effusion. Mitral Valve: The mitral valve is normal in structure. Trivial mitral valve regurgitation. No evidence of mitral valve stenosis. Tricuspid Valve: The tricuspid valve is normal in structure. Tricuspid valve regurgitation is trivial. Aortic Valve: The aortic valve is tricuspid. Aortic valve regurgitation is not visualized. No aortic stenosis is present. Aortic valve mean gradient measures 3.0 mmHg. Aortic valve peak gradient measures 5.3 mmHg. Aortic valve area, by VTI measures 3.40 cm. Pulmonic Valve: The pulmonic valve was grossly normal. Pulmonic valve regurgitation is trivial. Aorta: The aortic root is normal in size and structure and aortic dilatation noted. There is dilatation of the ascending aorta, measuring 40 mm. Venous: The inferior vena cava is normal in size with greater than 50% respiratory variability, suggesting right atrial pressure of 3 mmHg. IAS/Shunts: The interatrial septum was not well visualized.  LEFT VENTRICLE PLAX 2D LVIDd:         4.60 cm   Diastology LVIDs:         2.90 cm   LV e' medial:    8.59 cm/s LV PW:         1.00 cm   LV E/e' medial:  5.7 LV IVS:        1.20 cm   LV e' lateral:   11.60 cm/s LVOT diam:     2.40 cm   LV E/e' lateral: 4.2 LV SV:         80 LV SV Index:   39 LVOT Area:     4.52 cm  RIGHT VENTRICLE RV Basal diam:  3.40 cm RV Mid diam:    3.10 cm RV S prime:     13.90 cm/s TAPSE (M-mode): 2.1 cm LEFT ATRIUM           Index        RIGHT ATRIUM           Index LA diam:      3.30 cm 1.62 cm/m   RA Area:     16.30 cm LA Vol (A2C): 28.0 ml 13.73 ml/m  RA Volume:   38.60 ml  18.92 ml/m LA Vol (A4C): 41.3 ml 20.25 ml/m  AORTIC VALVE AV Area (Vmax):    3.19 cm AV Area (Vmean):   3.12 cm AV Area (VTI):     3.40 cm AV Vmax:           115.00 cm/s AV Vmean:          78.400 cm/s AV VTI:            0.234 m AV Peak Grad:      5.3 mmHg AV Mean Grad:      3.0 mmHg LVOT Vmax:         81.20 cm/s LVOT Vmean:        54.000 cm/s LVOT VTI:           0.176 m LVOT/AV VTI ratio: 0.75  AORTA Ao Root diam: 3.40 cm Ao Asc diam:  4.00 cm MITRAL VALVE               TRICUSPID VALVE MV Area (PHT): 2.95 cm    TR Peak grad:   15.5 mmHg MV Decel Time: 257 msec    TR Vmax:        197.00 cm/s MV E velocity: 49.30 cm/s MV  A velocity: 68.70 cm/s  SHUNTS MV E/A ratio:  0.72        Systemic VTI:  0.18 m                            Systemic Diam: 2.40 cm Lonni Nanas MD Electronically signed by Lonni Nanas MD Signature Date/Time: 08/18/2023/12:00:15 PM    Final    VAS US  ABI WITH/WO TBI Result Date: 08/17/2023  LOWER EXTREMITY DOPPLER STUDY Patient Name:  William [name]  Date of Exam:   08/17/2023 Medical Rec #: [id number]          Accession #:    [id number] Date of Birth: March 19, 1960          Patient Gender: M Patient Age:   19 years Exam Location:  [facility] Procedure:      VAS US  ABI WITH/WO TBI Referring Phys: ELSIE SAVANNAH --------------------------------------------------------------------------------  Indications: Ulceration. Bilateral LE wounds and Cellulitis secondary to severe              eczema. High Risk Factors: Hyperlipidemia. Other Factors: HIV.  Limitations: Today's exam was limited due to an open wound and bandages and              edema of right foot. Comparison Study: No prior study Performing Technologist: Rachel Pellet RVS  Examination Guidelines: A complete evaluation includes at minimum, Doppler waveform signals and systolic blood pressure reading at the level of bilateral brachial, anterior tibial, and posterior tibial arteries, when vessel segments are accessible. Bilateral testing is considered an integral part of a complete examination. Photoelectric Plethysmograph (PPG) waveforms and toe systolic pressure readings are included as required and additional duplex testing as needed. Limited examinations for reoccurring indications may be performed as noted.  ABI Findings:  +---------+------------------+-----+-----------+--------+ Right    Rt Pressure (mmHg)IndexWaveform   Comment  +---------+------------------+-----+-----------+--------+ Brachial 115                    triphasic           +---------+------------------+-----+-----------+--------+ PTA      160               1.39 multiphasic         +---------+------------------+-----+-----------+--------+ DP       160               1.39 multiphasic         +---------+------------------+-----+-----------+--------+ Great Toe107               0.93 Normal              +---------+------------------+-----+-----------+--------+ +---------+------------------+-----+-----------+-------+ Left     Lt Pressure (mmHg)IndexWaveform   Comment +---------+------------------+-----+-----------+-------+ Brachial 113                    triphasic          +---------+------------------+-----+-----------+-------+ PTA      135               1.17 multiphasic        +---------+------------------+-----+-----------+-------+ DP       140               1.22 multiphasic        +---------+------------------+-----+-----------+-------+ Great Toe105               0.91 Normal             +---------+------------------+-----+-----------+-------+ +-------+----------------------+-----------+------------+------------+ ABI/TBIToday's ABI  Today's TBIPrevious ABIPrevious TBI +-------+----------------------+-----------+------------+------------+ Right  1.39 (noncompressible)0.93                                +-------+----------------------+-----------+------------+------------+ Left   1.22                  0.91                                +-------+----------------------+-----------+------------+------------+  Summary: Right: Resting right ankle-brachial index indicates noncompressible right lower extremity arteries. The right toe-brachial index is normal. Left: Resting left ankle-brachial  index is within normal range. The left toe-brachial index is normal. *See table(s) above for measurements and observations.  Electronically signed by Lonni Gaskins MD on 08/17/2023 at 4:37:21 PM.    Final    VAS US  LOWER EXTREMITY VENOUS (DVT) Result Date: 08/17/2023  Lower Venous DVT Study Patient Name:  William [name]  Date of Exam:   08/17/2023 Medical Rec #: [id number]          Accession #:    [id number] Date of Birth: [birth year]          Patient Gender: M Patient Age:   63 years Exam Location:  [facility] Procedure:      VAS US  LOWER EXTREMITY VENOUS (DVT) Referring Phys: ELSIE SAVANNAH --------------------------------------------------------------------------------  Indications: Pain, Edema, Erythema, and Severe eczema.  Limitations: Open wound and bandages from mid calf down. Comparison Study: No prior study on file Performing Technologist: Alberta Lis RVS  Examination Guidelines: A complete evaluation includes B-mode imaging, spectral Doppler, color Doppler, and power Doppler as needed of all accessible portions of each vessel. Bilateral testing is considered an integral part of a complete examination. Limited examinations for reoccurring indications may be performed as noted. The reflux portion of the exam is performed with the patient in reverse Trendelenburg.  +---------+---------------+---------+-----------+----------+-------------------+ RIGHT    CompressibilityPhasicitySpontaneityPropertiesThrombus Aging      +---------+---------------+---------+-----------+----------+-------------------+ CFV      Full           Yes      Yes                                      +---------+---------------+---------+-----------+----------+-------------------+ SFJ      Full                                                             +---------+---------------+---------+-----------+----------+-------------------+ FV Prox  Full           Yes      Yes                                       +---------+---------------+---------+-----------+----------+-------------------+ FV Mid   Full                                                             +---------+---------------+---------+-----------+----------+-------------------+  FV DistalFull                                                             +---------+---------------+---------+-----------+----------+-------------------+ PFV      Full           Yes      Yes                                      +---------+---------------+---------+-----------+----------+-------------------+ POP      Full           Yes      Yes                                      +---------+---------------+---------+-----------+----------+-------------------+ PTV      Full                                         visualized from                                                           proximal to mid                                                           portions            +---------+---------------+---------+-----------+----------+-------------------+ PERO     Full                                         visualized from                                                           proximal to mid                                                           portions            +---------+---------------+---------+-----------+----------+-------------------+ Gastroc  Full                                                             +---------+---------------+---------+-----------+----------+-------------------+  TP trunk                                              patent              +---------+---------------+---------+-----------+----------+-------------------+   +----+---------------+---------+-----------+----------+--------------+ LEFTCompressibilityPhasicitySpontaneityPropertiesThrombus Aging +----+---------------+---------+-----------+----------+--------------+ CFV Full            Yes      Yes                                 +----+---------------+---------+-----------+----------+--------------+ SFJ Full                                                        +----+---------------+---------+-----------+----------+--------------+     Summary: RIGHT: - There is no evidence of deep vein thrombosis in the lower extremity. However, portions of this examination were limited- see technologist comments above.  - No cystic structure found in the popliteal fossa. - Ultrasound characteristics of enlarged lymph nodes are noted in the groin.  LEFT: - No evidence of common femoral vein obstruction.  - Ultrasound characteristics of enlarged lymph nodes noted in the groin.  *See table(s) above for measurements and observations. Electronically signed by Lonni Gaskins MD on 08/17/2023 at 4:37:09 PM.    Final     ASSESSMENT/PLAN:  Assessment: Principal Problem:   Cellulitis of right leg Active Problems:   Human immunodeficiency virus (HIV) disease (HCC)   Alcohol use disorder, moderate, dependence (HCC)   Skin ulcers of foot, bilateral (HCC), R> L, painful   Eczematous dermatitis   Staphylococcus aureus bacteremia  William [name] is a 62 y.o. with a pertinent PMH of HIV on Biktarvy , atopic dermatitis, hyperlipidemia, who presented with right leg pain and admitted for wound infection. Blood cultures positive for MSSA.    Plan: #Right Leg Cellulitis  #MSSA Bacteremia  #Eczematous dermatitis  #Right Lower extremity Swelling  Presented with Right Leg pain, superficial blisters with erythema, and draining purulent fluid. Patient has chronic pruritus and excoriations are recognized risk factors for recurrent skin and soft tissue infections Patient has not had fevers or leukocytosis.  -wound care consulting -IV ancef  per ID for MSSA bacteremia and cellulitis  -Following blood cultures -clobetasol  ointment for pruritus  -RLE swelling- VAS US  LE and VAS US  ABI with/without TBI  complete. No DVT seen, but limited exam with bandaging. + for enlarged lymph node on right, right toe ABI normal.  -TTE = LV EF 60-65%; mild left ventriclar hypertrophy, trivial mitral and tricuspid regurg,  -TEE scheduled for Monday -PT consulted for mobility assessment. Patient encouraged to get out of bed.   #HIV CD4 count 288, Not necessary to start any prophylactic therapy.  Patient also has history of possible non adherence to antiretroviral therapy. -Biktarvy   #Hyperlipidemia  LDL 122 -Rosuvastatin  10 mg  #Hypertension  Home regimen includes hydrochlorothiazide  25 mg, but patient is noncompliant per H&P. BP stable  -holding BP meds for now   #Alcohol Use Disorder  Last alcohol was 2-3 weeks ago. No signs of withdrawal, low concern for withdrawal  #Cigarette Smoking   -nicotine  patch   Best Practice: Diet: Regular diet VTE: enoxaparin  (LOVENOX )  injection 40 mg Start: 08/16/23 1045 Code: Full  Disposition planning: disposition pending follow up bacteremia cultures  Signature:  Labrian Torregrossa [facility]  1:56 PM, 08/18/2023  On Call pager [phone]

## 2023-08-18 NOTE — Plan of Care (Signed)

## 2023-08-18 NOTE — Progress Notes (Signed)
 Subjective: No new complaints   Antibiotics:  Anti-infectives (From admission, onward)    Start     Dose/Rate Route Frequency Ordered Stop   08/17/23 0200  ceFAZolin  (ANCEF ) IVPB 2g/100 mL premix        2 g 200 mL/hr over 30 Minutes Intravenous Every 8 hours 08/17/23 0149     08/16/23 1400  bictegravir-emtricitabine -tenofovir  AF (BIKTARVY ) 50-200-25 MG per tablet 1 tablet        1 tablet Oral Daily 08/16/23 1313     08/16/23 1000  vancomycin  (VANCOREADY) IVPB 2000 mg/400 mL        2,000 mg 200 mL/hr over 120 Minutes Intravenous  Once 08/16/23 0953 08/16/23 1342       Medications: Scheduled Meds:  bictegravir-emtricitabine -tenofovir  AF  1 tablet Oral Daily   clobetasol  ointment  1 Application Topical BID   enoxaparin  (LOVENOX ) injection  40 mg Subcutaneous Q24H   hydrOXYzine   25 mg Oral BID   nicotine   7 mg Transdermal Daily   rosuvastatin   10 mg Oral Daily   Continuous Infusions:   ceFAZolin  (ANCEF ) IV 2 g (08/18/23 1226)   PRN Meds:.acetaminophen  **OR** acetaminophen , polyethylene glycol    Objective: Weight change:   Intake/Output Summary (Last 24 hours) at 08/18/2023 1725 Last data filed at 08/18/2023 1600 Gross per 24 hour  Intake 590 ml  Output 4700 ml  Net -4110 ml   Blood pressure 137/80, pulse 77, temperature 98.1 F (36.7 C), temperature source Oral, resp. rate 18, height 5' 11 (1.803 m), weight 83.9 kg, SpO2 97%. Temp:  [97.5 F (36.4 C)-98.1 F (36.7 C)] 98.1 F (36.7 C) (07/11 1614) Pulse Rate:  [59-81] 77 (07/11 1614) Resp:  [16-18] 18 (07/11 1614) BP: (114-137)/(80-89) 137/80 (07/11 1614) SpO2:  [95 %-98 %] 97 % (07/11 1614)  Physical Exam: Physical Exam Constitutional:      Appearance: He is well-developed.  HENT:     Head: Normocephalic and atraumatic.  Eyes:     Conjunctiva/sclera: Conjunctivae normal.  Cardiovascular:     Rate and Rhythm: Normal rate and regular rhythm.  Pulmonary:     Effort: Pulmonary effort is  normal. No respiratory distress.     Breath sounds: No wheezing.  Abdominal:     General: There is no distension.     Palpations: Abdomen is soft.  Musculoskeletal:        General: Normal range of motion.     Cervical back: Normal range of motion and neck supple.  Skin:    General: Skin is warm and dry.     Findings: No erythema or rash.  Neurological:     General: No focal deficit present.     Mental Status: He is alert and oriented to person, place, and time.  Psychiatric:        Mood and Affect: Mood normal.        Behavior: Behavior normal.        Thought Content: Thought content normal.        Judgment: Judgment normal.     LE with psoriatic  changes edema  CBC:    BMET Recent Labs    08/17/23 0532 08/18/23 0640  NA 138 139  K 3.7 3.8  CL 108 107  CO2 22 23  GLUCOSE 106* 89  BUN 6 5*  CREATININE 0.86 0.78  CALCIUM  8.7* 8.6*     Liver Panel  Recent Labs    08/16/23 0815  PROT 7.3  ALBUMIN  3.7  AST 39  ALT 38  ALKPHOS 52  BILITOT 0.8       Sedimentation Rate No results for input(s): ESRSEDRATE in the last 72 hours. C-Reactive Protein No results for input(s): CRP in the last 72 hours.  Micro Results: Recent Results (from the past 720 hours)  Culture, blood (Routine x 2)     Status: Abnormal (Preliminary result)   Collection Time: 08/16/23  8:15 AM   Specimen: BLOOD RIGHT HAND  Result Value Ref Range Status   Specimen Description BLOOD RIGHT HAND  Final   Special Requests   Final    BOTTLES DRAWN AEROBIC ONLY Blood Culture results may not be optimal due to an inadequate volume of blood received in culture bottles   Culture  Setup Time   Final    GRAM POSITIVE COCCI AEROBIC BOTTLE ONLY CRITICAL RESULT CALLED TO, READ BACK BY AND VERIFIED WITH: PHARMD GREG ABBOTT [id number] BY JRS    Culture (A)  Final    STAPHYLOCOCCUS AUREUS SUSCEPTIBILITIES TO FOLLOW Performed at [facility], [address], [place], [place] [address]     Report Status PENDING  Incomplete  Culture, blood (Routine x 2)     Status: None (Preliminary result)   Collection Time: 08/16/23  8:15 AM   Specimen: BLOOD RIGHT HAND  Result Value Ref Range Status   Specimen Description BLOOD RIGHT HAND  Final   Special Requests   Final    BOTTLES DRAWN AEROBIC AND ANAEROBIC Blood Culture adequate volume   Culture   Final    NO GROWTH 2 DAYS Performed at [facility], [address], [place], [place] [address]    Report Status PENDING  Incomplete  Blood Culture ID Panel (Reflexed)     Status: Abnormal   Collection Time: 08/16/23  8:15 AM  Result Value Ref Range Status   Enterococcus faecalis NOT DETECTED NOT DETECTED Final   Enterococcus Faecium NOT DETECTED NOT DETECTED Final   Listeria monocytogenes NOT DETECTED NOT DETECTED Final   Staphylococcus species DETECTED (A) NOT DETECTED Final    Comment: CRITICAL RESULT CALLED TO, READ BACK BY AND VERIFIED WITH: PHARMD GREG ABBOTT [id number] BY JRS    Staphylococcus aureus (BCID) DETECTED (A) NOT DETECTED Final    Comment: CRITICAL RESULT CALLED TO, READ BACK BY AND VERIFIED WITH: PHARMD GREG ABBOTT [id number] BY JRS    Staphylococcus epidermidis NOT DETECTED NOT DETECTED Final   Staphylococcus lugdunensis NOT DETECTED NOT DETECTED Final   Streptococcus species NOT DETECTED NOT DETECTED Final   Streptococcus agalactiae NOT DETECTED NOT DETECTED Final   Streptococcus pneumoniae NOT DETECTED NOT DETECTED Final   Streptococcus pyogenes NOT DETECTED NOT DETECTED Final   A.calcoaceticus-baumannii NOT DETECTED NOT DETECTED Final   Bacteroides fragilis NOT DETECTED NOT DETECTED Final   Enterobacterales NOT DETECTED NOT DETECTED Final   Enterobacter cloacae complex NOT DETECTED NOT DETECTED Final   Escherichia coli NOT DETECTED NOT DETECTED Final   Klebsiella aerogenes NOT DETECTED NOT DETECTED Final   Klebsiella oxytoca NOT DETECTED NOT DETECTED Final   Klebsiella pneumoniae NOT DETECTED  NOT DETECTED Final   Proteus species NOT DETECTED NOT DETECTED Final   Salmonella species NOT DETECTED NOT DETECTED Final   Serratia marcescens NOT DETECTED NOT DETECTED Final   Haemophilus influenzae NOT DETECTED NOT DETECTED Final   Neisseria meningitidis NOT DETECTED NOT DETECTED Final   Pseudomonas aeruginosa NOT DETECTED NOT DETECTED Final   Stenotrophomonas maltophilia NOT DETECTED NOT DETECTED  Final   Candida albicans NOT DETECTED NOT DETECTED Final   Candida auris NOT DETECTED NOT DETECTED Final   Candida glabrata NOT DETECTED NOT DETECTED Final   Candida krusei NOT DETECTED NOT DETECTED Final   Candida parapsilosis NOT DETECTED NOT DETECTED Final   Candida tropicalis NOT DETECTED NOT DETECTED Final   Cryptococcus neoformans/gattii NOT DETECTED NOT DETECTED Final   Meth resistant mecA/C and MREJ NOT DETECTED NOT DETECTED Final    Comment: Performed at [facility], [address], [place], [place] [address]  Culture, blood (Routine X 2) w Reflex to ID Panel     Status: None (Preliminary result)   Collection Time: 08/17/23  6:52 PM   Specimen: BLOOD  Result Value Ref Range Status   Specimen Description BLOOD SITE NOT SPECIFIED  Final   Special Requests   Final    BOTTLES DRAWN AEROBIC ONLY Blood Culture adequate volume   Culture   Final    NO GROWTH < 24 HOURS Performed at [facility], [address], [place], [place] [address]    Report Status PENDING  Incomplete  Culture, blood (Routine X 2) w Reflex to ID Panel     Status: None (Preliminary result)   Collection Time: 08/17/23  6:53 PM   Specimen: BLOOD  Result Value Ref Range Status   Specimen Description BLOOD SITE NOT SPECIFIED  Final   Special Requests   Final    BOTTLES DRAWN AEROBIC ONLY Blood Culture adequate volume   Culture   Final    NO GROWTH < 24 HOURS Performed at [facility], [address] [place], [place] [address]    Report Status PENDING  Incomplete     Studies/Results: ECHOCARDIOGRAM COMPLETE Result Date: 08/18/2023    ECHOCARDIOGRAM REPORT   Patient Name:   JONAN SEUFERT Date of Exam: 08/18/2023 Medical Rec #:  [id number]         Height:       71.0 in Accession #:    [id number]        Weight:       185.0 lb Date of Birth:  05-Feb-1961         BSA:          2.040 m Patient Age:    62 years          BP:           131/89 mmHg Patient Gender: M                 HR:           63 bpm. Exam Location:  Inpatient Procedure: 2D Echo, Color Doppler and Cardiac Doppler (Both Spectral and Color            Flow Doppler were utilized during procedure). Indications:    Endocarditis  History:        Patient has no prior history of Echocardiogram examinations.  Sonographer:    Benard Stallion Referring Phys: [id number] ELSIE KATHEE SAVANNAH IMPRESSIONS  1. Left ventricular ejection fraction, by estimation, is 60 to 65%. The left ventricle has normal function. The left ventricle has no regional wall motion abnormalities. There is mild left ventricular hypertrophy. Left ventricular diastolic parameters were normal.  2. Right ventricular systolic function is normal. The right ventricular size is normal. There is normal pulmonary artery systolic pressure. The estimated right ventricular systolic pressure is 18.5 mmHg.  3. The mitral valve is normal in structure. Trivial mitral  valve regurgitation. No evidence of mitral stenosis.  4. The aortic valve is tricuspid. Aortic valve regurgitation is not visualized. No aortic stenosis is present.  5. Aortic dilatation noted. There is dilatation of the ascending aorta, measuring 40 mm.  6. The inferior vena cava is normal in size with greater than 50% respiratory variability, suggesting right atrial pressure of 3 mmHg. Conclusion(s)/Recommendation(s): No evidence of valvular vegetations on this transthoracic echocardiogram. Consider a transesophageal echocardiogram to exclude infective endocarditis if clinically indicated. FINDINGS  Left  Ventricle: Left ventricular ejection fraction, by estimation, is 60 to 65%. The left ventricle has normal function. The left ventricle has no regional wall motion abnormalities. The left ventricular internal cavity size was normal in size. There is  mild left ventricular hypertrophy. Left ventricular diastolic parameters were normal. Right Ventricle: The right ventricular size is normal. No increase in right ventricular wall thickness. Right ventricular systolic function is normal. There is normal pulmonary artery systolic pressure. The tricuspid regurgitant velocity is 1.97 m/s, and  with an assumed right atrial pressure of 3 mmHg, the estimated right ventricular systolic pressure is 18.5 mmHg. Left Atrium: Left atrial size was normal in size. Right Atrium: Right atrial size was normal in size. Pericardium: There is no evidence of pericardial effusion. Mitral Valve: The mitral valve is normal in structure. Trivial mitral valve regurgitation. No evidence of mitral valve stenosis. Tricuspid Valve: The tricuspid valve is normal in structure. Tricuspid valve regurgitation is trivial. Aortic Valve: The aortic valve is tricuspid. Aortic valve regurgitation is not visualized. No aortic stenosis is present. Aortic valve mean gradient measures 3.0 mmHg. Aortic valve peak gradient measures 5.3 mmHg. Aortic valve area, by VTI measures 3.40 cm. Pulmonic Valve: The pulmonic valve was grossly normal. Pulmonic valve regurgitation is trivial. Aorta: The aortic root is normal in size and structure and aortic dilatation noted. There is dilatation of the ascending aorta, measuring 40 mm. Venous: The inferior vena cava is normal in size with greater than 50% respiratory variability, suggesting right atrial pressure of 3 mmHg. IAS/Shunts: The interatrial septum was not well visualized.  LEFT VENTRICLE PLAX 2D LVIDd:         4.60 cm   Diastology LVIDs:         2.90 cm   LV e' medial:    8.59 cm/s LV PW:         1.00 cm   LV E/e'  medial:  5.7 LV IVS:        1.20 cm   LV e' lateral:   11.60 cm/s LVOT diam:     2.40 cm   LV E/e' lateral: 4.2 LV SV:         80 LV SV Index:   39 LVOT Area:     4.52 cm  RIGHT VENTRICLE RV Basal diam:  3.40 cm RV Mid diam:    3.10 cm RV S prime:     13.90 cm/s TAPSE (M-mode): 2.1 cm LEFT ATRIUM           Index        RIGHT ATRIUM           Index LA diam:      3.30 cm 1.62 cm/m   RA Area:     16.30 cm LA Vol (A2C): 28.0 ml 13.73 ml/m  RA Volume:   38.60 ml  18.92 ml/m LA Vol (A4C): 41.3 ml 20.25 ml/m  AORTIC VALVE AV Area (Vmax):    3.19 cm AV Area (Vmean):  3.12 cm AV Area (VTI):     3.40 cm AV Vmax:           115.00 cm/s AV Vmean:          78.400 cm/s AV VTI:            0.234 m AV Peak Grad:      5.3 mmHg AV Mean Grad:      3.0 mmHg LVOT Vmax:         81.20 cm/s LVOT Vmean:        54.000 cm/s LVOT VTI:          0.176 m LVOT/AV VTI ratio: 0.75  AORTA Ao Root diam: 3.40 cm Ao Asc diam:  4.00 cm MITRAL VALVE               TRICUSPID VALVE MV Area (PHT): 2.95 cm    TR Peak grad:   15.5 mmHg MV Decel Time: 257 msec    TR Vmax:        197.00 cm/s MV E velocity: 49.30 cm/s MV A velocity: 68.70 cm/s  SHUNTS MV E/A ratio:  0.72        Systemic VTI:  0.18 m                            Systemic Diam: 2.40 cm Lonni Nanas MD Electronically signed by Lonni Nanas MD Signature Date/Time: 08/18/2023/12:00:15 PM    Final    VAS US  ABI WITH/WO TBI Result Date: 08/17/2023  LOWER EXTREMITY DOPPLER STUDY Patient Name:  William [name]  Date of Exam:   08/17/2023 Medical Rec #: [id number]          Accession #:    [id number] Date of Birth: [birth year]          Patient Gender: M Patient Age:   44 years Exam Location:  [facility] Procedure:      VAS US  ABI WITH/WO TBI Referring Phys: ELSIE SAVANNAH --------------------------------------------------------------------------------  Indications: Ulceration. Bilateral LE wounds and Cellulitis secondary to severe              eczema. High Risk Factors:  Hyperlipidemia. Other Factors: HIV.  Limitations: Today's exam was limited due to an open wound and bandages and              edema of right foot. Comparison Study: No prior study Performing Technologist: Rachel Pellet RVS  Examination Guidelines: A complete evaluation includes at minimum, Doppler waveform signals and systolic blood pressure reading at the level of bilateral brachial, anterior tibial, and posterior tibial arteries, when vessel segments are accessible. Bilateral testing is considered an integral part of a complete examination. Photoelectric Plethysmograph (PPG) waveforms and toe systolic pressure readings are included as required and additional duplex testing as needed. Limited examinations for reoccurring indications may be performed as noted.  ABI Findings: +---------+------------------+-----+-----------+--------+ Right    Rt Pressure (mmHg)IndexWaveform   Comment  +---------+------------------+-----+-----------+--------+ Brachial 115                    triphasic           +---------+------------------+-----+-----------+--------+ PTA      160               1.39 multiphasic         +---------+------------------+-----+-----------+--------+ DP       160               1.39 multiphasic         +---------+------------------+-----+-----------+--------+  Great Toe107               0.93 Normal              +---------+------------------+-----+-----------+--------+ +---------+------------------+-----+-----------+-------+ Left     Lt Pressure (mmHg)IndexWaveform   Comment +---------+------------------+-----+-----------+-------+ Brachial 113                    triphasic          +---------+------------------+-----+-----------+-------+ PTA      135               1.17 multiphasic        +---------+------------------+-----+-----------+-------+ DP       140               1.22 multiphasic        +---------+------------------+-----+-----------+-------+ Great  Toe105               0.91 Normal             +---------+------------------+-----+-----------+-------+ +-------+----------------------+-----------+------------+------------+ ABI/TBIToday's ABI           Today's TBIPrevious ABIPrevious TBI +-------+----------------------+-----------+------------+------------+ Right  1.39 (noncompressible)0.93                                +-------+----------------------+-----------+------------+------------+ Left   1.22                  0.91                                +-------+----------------------+-----------+------------+------------+  Summary: Right: Resting right ankle-brachial index indicates noncompressible right lower extremity arteries. The right toe-brachial index is normal. Left: Resting left ankle-brachial index is within normal range. The left toe-brachial index is normal. *See table(s) above for measurements and observations.  Electronically signed by Lonni Gaskins MD on 08/17/2023 at 4:37:21 PM.    Final    VAS US  LOWER EXTREMITY VENOUS (DVT) Result Date: 08/17/2023  Lower Venous DVT Study Patient Name:  EINAR NOLASCO  Date of Exam:   08/17/2023 Medical Rec #: [id number]          Accession #:    [id number] Date of Birth: 09-11-1960          Patient Gender: M Patient Age:   73 years Exam Location:  [facility] Procedure:      VAS US  LOWER EXTREMITY VENOUS (DVT) Referring Phys: ELSIE SAVANNAH --------------------------------------------------------------------------------  Indications: Pain, Edema, Erythema, and Severe eczema.  Limitations: Open wound and bandages from mid calf down. Comparison Study: No prior study on file Performing Technologist: Alberta Lis RVS  Examination Guidelines: A complete evaluation includes B-mode imaging, spectral Doppler, color Doppler, and power Doppler as needed of all accessible portions of each vessel. Bilateral testing is considered an integral part of a complete examination. Limited  examinations for reoccurring indications may be performed as noted. The reflux portion of the exam is performed with the patient in reverse Trendelenburg.  +---------+---------------+---------+-----------+----------+-------------------+ RIGHT    CompressibilityPhasicitySpontaneityPropertiesThrombus Aging      +---------+---------------+---------+-----------+----------+-------------------+ CFV      Full           Yes      Yes                                      +---------+---------------+---------+-----------+----------+-------------------+ SFJ  Full                                                             +---------+---------------+---------+-----------+----------+-------------------+ FV Prox  Full           Yes      Yes                                      +---------+---------------+---------+-----------+----------+-------------------+ FV Mid   Full                                                             +---------+---------------+---------+-----------+----------+-------------------+ FV DistalFull                                                             +---------+---------------+---------+-----------+----------+-------------------+ PFV      Full           Yes      Yes                                      +---------+---------------+---------+-----------+----------+-------------------+ POP      Full           Yes      Yes                                      +---------+---------------+---------+-----------+----------+-------------------+ PTV      Full                                         visualized from                                                           proximal to mid                                                           portions            +---------+---------------+---------+-----------+----------+-------------------+ PERO     Full  visualized from                                                            proximal to mid                                                           portions            +---------+---------------+---------+-----------+----------+-------------------+ Gastroc  Full                                                             +---------+---------------+---------+-----------+----------+-------------------+ TP trunk                                              patent              +---------+---------------+---------+-----------+----------+-------------------+   +----+---------------+---------+-----------+----------+--------------+ LEFTCompressibilityPhasicitySpontaneityPropertiesThrombus Aging +----+---------------+---------+-----------+----------+--------------+ CFV Full           Yes      Yes                                 +----+---------------+---------+-----------+----------+--------------+ SFJ Full                                                        +----+---------------+---------+-----------+----------+--------------+     Summary: RIGHT: - There is no evidence of deep vein thrombosis in the lower extremity. However, portions of this examination were limited- see technologist comments above.  - No cystic structure found in the popliteal fossa. - Ultrasound characteristics of enlarged lymph nodes are noted in the groin.  LEFT: - No evidence of common femoral vein obstruction.  - Ultrasound characteristics of enlarged lymph nodes noted in the groin.  *See table(s) above for measurements and observations. Electronically signed by Lonni Gaskins MD on 08/17/2023 at 4:37:09 PM.    Final       Assessment/Plan:  INTERVAL HISTORY: Pete blood cultures without growth less than 24 hours   Principal Problem:   Cellulitis of right leg Active Problems:   Human immunodeficiency virus (HIV) disease (HCC)   Alcohol use disorder, moderate, dependence (HCC)   Skin ulcers of foot, bilateral (HCC), R>  L, painful   Eczematous dermatitis   Staphylococcus aureus bacteremia    William [name] is a 63 y.o. male with  HIV, typically well-controlled on Biktarvy  though recently slightly viremic also with eczema for which he was taking topical steroids now admitted with sepsis and MSSA bacteremia  #1 MSSA bacteremia:  2D echocardiogram is without vegetations  TEE will be performed but not until early  next week.  Will continue cefazolin   Continue to follow-up blood cultures.  #2 HIV disease:  Follow-up repeat viral load   Continue Biktarvy   #3 almost nests he says that he will be going to live with his brother at discharge   I have personally spent 50 minutes involved in face-to-face and non-face-to-face activities for this patient on the day of the visit. Professional time spent includes the following activities: Preparing to see the patient (review of tests), Obtaining and/or reviewing separately obtained history (admission/discharge record), Performing a medically appropriate examination and/or evaluation , Ordering medications/tests/procedures, referring and communicating with other health care professionals, Documenting clinical information in the EMR, Independently interpreting results (not separately reported), Communicating results to the patient/family/caregiver, Counseling and educating the patient/family/caregiver and Care coordination (not separately reported).   Evaluation of the patient requires complex antimicrobial therapy evaluation, counseling , isolation needs to reduce disease transmission and risk assessment and mitigation.   My partner Dr. Dennise is covering this weekend and will follow-up the cultures and is available for questions and will take over the service on Monday.   LOS: 2 days   Jomarie Fleeta Rothman 08/18/2023, 5:25 PM

## 2023-08-18 NOTE — Progress Notes (Signed)
*  PRELIMINARY RESULTS* Echocardiogram 2D Echocardiogram has been performed.  William [name] 08/18/2023, 11:41 AM

## 2023-08-19 LAB — BASIC METABOLIC PANEL WITH GFR
Anion gap: 8 (ref 5–15)
BUN: 5 mg/dL — ABNORMAL LOW (ref 6–20)
CO2: 23 mmol/L (ref 22–32)
Calcium: 8.6 mg/dL — ABNORMAL LOW (ref 8.9–10.3)
Chloride: 108 mmol/L (ref 98–111)
Creatinine, Ser: 0.74 mg/dL (ref 0.61–1.24)
GFR, Estimated: >60 mL/min (ref >60)
Glucose, Bld: 84 mg/dL (ref 70–99)
Potassium: 3.7 mmol/L (ref 3.5–5.1)
Sodium: 139 mmol/L (ref 135–145)

## 2023-08-19 LAB — CULTURE, BLOOD (ROUTINE X 2)

## 2023-08-19 LAB — CBC
HCT: 38.7 % — ABNORMAL LOW (ref 39.0–52.0)
Hemoglobin: 12.4 g/dL — ABNORMAL LOW (ref 13.0–17.0)
MCH: 28.2 pg (ref 26.0–34.0)
MCHC: 32 g/dL (ref 30.0–36.0)
MCV: 88.2 fL (ref 80.0–100.0)
Platelets: 281 K/uL (ref 150–400)
RBC: 4.39 MIL/uL (ref 4.22–5.81)
RDW: 13.9 % (ref 11.5–15.5)
WBC: 5.8 K/uL (ref 4.0–10.5)
nRBC: 0 % (ref 0.0–0.2)

## 2023-08-19 MED ORDER — DIPHENHYDRAMINE HCL 25 MG PO CAPS
25.0000 mg | ORAL_CAPSULE | Freq: Three times a day (TID) | ORAL | Status: DC | PRN
  Administered 2023-08-20 – 2023-08-21 (×2): 25 mg via ORAL
  Filled 2023-08-19 (×2): qty 1

## 2023-08-19 NOTE — Plan of Care (Signed)

## 2023-08-19 NOTE — Plan of Care (Signed)
 Patient states he is ready to go home with his brother. No c/o voiced other than itchy skin on lower extremities.  Patient is able to apply ointment as directed to all affected areas and does need assist to cover legs.

## 2023-08-19 NOTE — Progress Notes (Cosign Needed Addendum)
 HD#3 SUBJECTIVE:  Patient Summary: William [name] is a 63 y.o. male, an IMTS patient with a pertinent PMH of HIV, severe atopic eczema, housing instability,  who presented with RLE cellulitis and MSSA bacteremia on IV ancef , negative TTE, awaiting TEE on Monday  Overnight Events: increased pruritus over RLE leg  Interim History: Denies pruritus. Intermittent RLE pain has improved. Worked with PT and is able to ambulate without significant discomfort. RLE edema has also improved. Good appetite. Wishes to be discharged soon. Will plan to stay with his brother after discharge until improvement in cellulitis.  OBJECTIVE:  Vital Signs: Vitals:   08/18/23 1614 08/18/23 1922 08/18/23 1954 08/19/23 0415  BP: 137/80 130/85  132/88  Pulse: 77 79  69  Resp: 18 16  16   Temp: 98.1 F (36.7 C) 98.3 F (36.8 C)  98.3 F (36.8 C)  TempSrc: Oral Oral  Oral  SpO2: 97% 96% 96% 97%  Weight:      Height:       Supplemental O2: Room Air SpO2: 97 % FiO2 (%): 21 %  Filed Weights   08/16/23 1151  Weight: 83.9 kg     Intake/Output Summary (Last 24 hours) at 08/19/2023 0652 Last data filed at 08/19/2023 0500 Gross per 24 hour  Intake 1298 ml  Output 4200 ml  Net -2902 ml   Net IO Since Admission: -8,088.55 mL [08/19/23 0652]  Physical Exam: General: Chronically ill-appearing man laying in bed. No acute distress. CV: RRR. No murmurs Pulmonary: Lungs CTAB. Normal effort.  Abdominal: Soft, nontender, non distended Extremities: Significant dry, scaly skin around the antecubital fossa, neck, and lower extremities. Bandage over the RLE without drainage. Skin wrinkling present with increased turgor, likely in the setting of decreased edema. Warm and dry otherwise. Neuro: alert and answering questions appropriately Psych: Normal mood and affect   Patient Lines/Drains/Airways Status     Active Line/Drains/Airways     Name Placement date Placement time Site Days   Peripheral IV 08/18/23 22  G 1.75 Anterior;Left Forearm 08/18/23  2047  Forearm  1   Wound / Incision (Open or Dehisced) 02/06/23 Venous stasis ulcer Pretibial Left open areas r/t vascular disease 02/06/23  0013  Pretibial  194   Wound / Incision (Open or Dehisced) 02/05/23 Venous stasis ulcer Foot Anterior;Right dry flaky w/ one area thats pink w/ scab on top 02/05/23  2000  Foot  195   Wound 08/16/23 Other (Comment) Leg Left;Right 08/16/23  --  Leg  3            Pertinent labs and imaging:      Latest Ref Rng & Units 08/19/2023    5:16 AM 08/18/2023    6:40 AM 08/17/2023    5:32 AM  CBC  WBC 4.0 - 10.5 K/uL 5.8  6.0  6.3   Hemoglobin 13.0 - 17.0 g/dL 87.5  87.3  87.5   Hematocrit 39.0 - 52.0 % 38.7  38.5  38.3   Platelets 150 - 400 K/uL 281  280  287        Latest Ref Rng & Units 08/19/2023    5:16 AM 08/18/2023    6:40 AM 08/17/2023    5:32 AM  CMP  Glucose 70 - 99 mg/dL 84  89  893   BUN 6 - 20 mg/dL 5  5  6    Creatinine 0.61 - 1.24 mg/dL 9.25  9.21  9.13   Sodium 135 - 145 mmol/L 139  139  138  Potassium 3.5 - 5.1 mmol/L 3.7  3.8  3.7   Chloride 98 - 111 mmol/L 108  107  108   CO2 22 - 32 mmol/L 23  23  22    Calcium  8.9 - 10.3 mg/dL 8.6  8.6  8.7     ECHOCARDIOGRAM COMPLETE Result Date: 08/18/2023    ECHOCARDIOGRAM REPORT   Patient Name:   William [name] Date of Exam: 08/18/2023 Medical Rec #:  [id number]         Height:       71.0 in Accession #:    [id number]        Weight:       185.0 lb Date of Birth:  1961/01/25         BSA:          2.040 m Patient Age:    62 years          BP:           131/89 mmHg Patient Gender: M                 HR:           63 bpm. Exam Location:  Inpatient Procedure: 2D Echo, Color Doppler and Cardiac Doppler (Both Spectral and Color            Flow Doppler were utilized during procedure). Indications:    Endocarditis  History:        Patient has no prior history of Echocardiogram examinations.  Sonographer:    Benard Stallion Referring Phys: [id number] ELSIE KATHEE SAVANNAH  IMPRESSIONS  1. Left ventricular ejection fraction, by estimation, is 60 to 65%. The left ventricle has normal function. The left ventricle has no regional wall motion abnormalities. There is mild left ventricular hypertrophy. Left ventricular diastolic parameters were normal.  2. Right ventricular systolic function is normal. The right ventricular size is normal. There is normal pulmonary artery systolic pressure. The estimated right ventricular systolic pressure is 18.5 mmHg.  3. The mitral valve is normal in structure. Trivial mitral valve regurgitation. No evidence of mitral stenosis.  4. The aortic valve is tricuspid. Aortic valve regurgitation is not visualized. No aortic stenosis is present.  5. Aortic dilatation noted. There is dilatation of the ascending aorta, measuring 40 mm.  6. The inferior vena cava is normal in size with greater than 50% respiratory variability, suggesting right atrial pressure of 3 mmHg. Conclusion(s)/Recommendation(s): No evidence of valvular vegetations on this transthoracic echocardiogram. Consider a transesophageal echocardiogram to exclude infective endocarditis if clinically indicated. FINDINGS  Left Ventricle: Left ventricular ejection fraction, by estimation, is 60 to 65%. The left ventricle has normal function. The left ventricle has no regional wall motion abnormalities. The left ventricular internal cavity size was normal in size. There is  mild left ventricular hypertrophy. Left ventricular diastolic parameters were normal. Right Ventricle: The right ventricular size is normal. No increase in right ventricular wall thickness. Right ventricular systolic function is normal. There is normal pulmonary artery systolic pressure. The tricuspid regurgitant velocity is 1.97 m/s, and  with an assumed right atrial pressure of 3 mmHg, the estimated right ventricular systolic pressure is 18.5 mmHg. Left Atrium: Left atrial size was normal in size. Right Atrium: Right atrial size was  normal in size. Pericardium: There is no evidence of pericardial effusion. Mitral Valve: The mitral valve is normal in structure. Trivial mitral valve regurgitation. No evidence of mitral valve stenosis. Tricuspid Valve: The tricuspid valve is normal in structure. Tricuspid  valve regurgitation is trivial. Aortic Valve: The aortic valve is tricuspid. Aortic valve regurgitation is not visualized. No aortic stenosis is present. Aortic valve mean gradient measures 3.0 mmHg. Aortic valve peak gradient measures 5.3 mmHg. Aortic valve area, by VTI measures 3.40 cm. Pulmonic Valve: The pulmonic valve was grossly normal. Pulmonic valve regurgitation is trivial. Aorta: The aortic root is normal in size and structure and aortic dilatation noted. There is dilatation of the ascending aorta, measuring 40 mm. Venous: The inferior vena cava is normal in size with greater than 50% respiratory variability, suggesting right atrial pressure of 3 mmHg. IAS/Shunts: The interatrial septum was not well visualized.  LEFT VENTRICLE PLAX 2D LVIDd:         4.60 cm   Diastology LVIDs:         2.90 cm   LV e' medial:    8.59 cm/s LV PW:         1.00 cm   LV E/e' medial:  5.7 LV IVS:        1.20 cm   LV e' lateral:   11.60 cm/s LVOT diam:     2.40 cm   LV E/e' lateral: 4.2 LV SV:         80 LV SV Index:   39 LVOT Area:     4.52 cm  RIGHT VENTRICLE RV Basal diam:  3.40 cm RV Mid diam:    3.10 cm RV S prime:     13.90 cm/s TAPSE (M-mode): 2.1 cm LEFT ATRIUM           Index        RIGHT ATRIUM           Index LA diam:      3.30 cm 1.62 cm/m   RA Area:     16.30 cm LA Vol (A2C): 28.0 ml 13.73 ml/m  RA Volume:   38.60 ml  18.92 ml/m LA Vol (A4C): 41.3 ml 20.25 ml/m  AORTIC VALVE AV Area (Vmax):    3.19 cm AV Area (Vmean):   3.12 cm AV Area (VTI):     3.40 cm AV Vmax:           115.00 cm/s AV Vmean:          78.400 cm/s AV VTI:            0.234 m AV Peak Grad:      5.3 mmHg AV Mean Grad:      3.0 mmHg LVOT Vmax:         81.20 cm/s LVOT Vmean:         54.000 cm/s LVOT VTI:          0.176 m LVOT/AV VTI ratio: 0.75  AORTA Ao Root diam: 3.40 cm Ao Asc diam:  4.00 cm MITRAL VALVE               TRICUSPID VALVE MV Area (PHT): 2.95 cm    TR Peak grad:   15.5 mmHg MV Decel Time: 257 msec    TR Vmax:        197.00 cm/s MV E velocity: 49.30 cm/s MV A velocity: 68.70 cm/s  SHUNTS MV E/A ratio:  0.72        Systemic VTI:  0.18 m                            Systemic Diam: 2.40 cm Lonni Nanas MD Electronically signed by Lonni Nanas  MD Signature Date/Time: 08/18/2023/12:00:15 PM    Final     ASSESSMENT/PLAN:  Assessment: Principal Problem:   MSSA bacteremia Active Problems:   Human immunodeficiency virus (HIV) disease (HCC)   Alcohol use disorder, moderate, dependence (HCC)   Skin ulcers of foot, bilateral (HCC), R> L, painful   Eczematous dermatitis   Cellulitis of right leg   Staphylococcus aureus bacteremia   Dilatation of aorta ~51mm  (HCC)   Plan: #Right Leg Cellulitis  #MSSA Bacteremia  #Eczematous dermatitis  7/19 repeat blood cultures with no growth to date. Continues on IV Ancef . TTE without vegetations; awaiting TEE on Monday to guide outpatient therapy. Overall improving. Susceptibilities only resistant to tetracycline and erythromycin, otherwise pansensitive.  - Continue wound care - Continue IV ancef  for bacteremia until IE ruled out - Follow repeat blood cultures - clobetasol  ointment for pruritus. PRN benadryl  added  #HIV CD4 count 288, Not necessary to start any prophylactic therapy. H1V quant 60 during this admission. Restarted on Biktarvy  after brief lapse in his adherence - Continue on Biktarvy   #Hyperlipidemia  LDL 122 -Rosuvastatin  10 mg  #Substance use (EtOH and tobacco) Last drink in 3 weeks. Nicotine  patch here.   Best Practice: Diet: Regular diet VTE: enoxaparin  (LOVENOX ) injection 40 mg Start: 08/16/23 1045 Code: Full  Disposition planning: Therapy Recs: home, DME: none Family  Contact: Brother, unable to be notified.  Tried  DISPO: Anticipated discharge in 2 days to pending TEE results  Signature:  Hadassah Elnora [facility]  6:52 AM, 08/19/2023  On Call pager [phone]

## 2023-08-19 NOTE — Progress Notes (Signed)
 Patient removed bandage to lower right leg to apply silvadene  cream due to leg was itching. Nurse rewrapped leg per patient request to keep it covered to prevent risk for infection and SN instructed patient to not remove bandage and to stop scratching as this could cause skin infection and prolong his stay in the hospital.  Patient verbalized understanding and promised not to scratch as he wants to go home and not stay longer than necessarry.  Patient resting comfortably at nurse departure. Call light in reach,bed in low position.

## 2023-08-20 LAB — CBC
HCT: 40.2 % (ref 39.0–52.0)
Hemoglobin: 13 g/dL (ref 13.0–17.0)
MCH: 28.3 pg (ref 26.0–34.0)
MCHC: 32.3 g/dL (ref 30.0–36.0)
MCV: 87.4 fL (ref 80.0–100.0)
Platelets: 286 K/uL (ref 150–400)
RBC: 4.6 MIL/uL (ref 4.22–5.81)
RDW: 13.9 % (ref 11.5–15.5)
WBC: 6 K/uL (ref 4.0–10.5)
nRBC: 0 % (ref 0.0–0.2)

## 2023-08-20 NOTE — Plan of Care (Signed)

## 2023-08-20 NOTE — Progress Notes (Signed)
 HD#4 SUBJECTIVE:  Patient Summary: William [name] is a 63 y.o. with a pertinent PMH of HIV on Biktarvy , atopic dermatitis, hyperlipidemia, who presented with right leg pain and admitted for wound infection. Blood cultures + for MSSA.   Overnight Events and Interim Events: No overnight events. At beside, patient denies pain and states itching has been better since 7/12. Right leg swelling decreased from 7/10. Patient said he is ambulating more.   OBJECTIVE:  Vital Signs: Vitals:   08/19/23 1753 08/19/23 1911 08/20/23 0428 08/20/23 0806  BP: (!) 137/96 (!) 124/90 (!) 128/99 106/85  Pulse: 70 78 65 72  Resp: 19 16 16    Temp: 98 F (36.7 C) 98.8 F (37.1 C) 98.3 F (36.8 C) 98.3 F (36.8 C)  TempSrc: Oral Oral Oral Oral  SpO2: 98% 96% 95% 94%  Weight:      Height:       Supplemental O2: Room Air SpO2: 94 % FiO2 (%): 21 %  Filed Weights   08/16/23 1151  Weight: 83.9 kg     Intake/Output Summary (Last 24 hours) at 08/20/2023 1229 Last data filed at 08/20/2023 0807 Gross per 24 hour  Intake --  Output 4700 ml  Net -4700 ml   Net IO Since Admission: -12,788.55 mL [08/20/23 1229]  Physical Exam: Physical Exam Cardiovascular:     Rate and Rhythm: Normal rate and regular rhythm.     Heart sounds: Normal heart sounds.  Pulmonary:     Effort: Pulmonary effort is normal.     Breath sounds: Normal breath sounds.  Abdominal:     General: Abdomen is flat.     Palpations: Abdomen is soft.  Skin:    General: Skin is warm and dry.     Coloration: Skin is ashen.     Comments: RLE swollen compared to LLE (less so than 7/11) Patient has RLL and LLL bandaged.   Neurological:     Mental Status: He is alert.     Patient Lines/Drains/Airways Status     Active Line/Drains/Airways     Name Placement date Placement time Site Days   Peripheral IV 08/16/23 22 G 1.75 Anterior;Left Forearm 08/16/23  1312  Forearm  1   Wound / Incision (Open or Dehisced) 02/06/23 Venous stasis  ulcer Pretibial Left open areas r/t vascular disease 02/06/23  0013  Pretibial  192   Wound / Incision (Open or Dehisced) 02/05/23 Venous stasis ulcer Foot Anterior;Right dry flaky w/ one area thats pink w/ scab on top 02/05/23  2000  Foot  193   Wound 08/16/23 Other (Comment) Leg Left;Right 08/16/23  --  Leg  1            Pertinent labs and imaging:      Latest Ref Rng & Units 08/20/2023    5:54 AM 08/19/2023    5:16 AM 08/18/2023    6:40 AM  CBC  WBC 4.0 - 10.5 K/uL 6.0  5.8  6.0   Hemoglobin 13.0 - 17.0 g/dL 86.9  87.5  87.3   Hematocrit 39.0 - 52.0 % 40.2  38.7  38.5   Platelets 150 - 400 K/uL 286  281  280        Latest Ref Rng & Units 08/19/2023    5:16 AM 08/18/2023    6:40 AM 08/17/2023    5:32 AM  CMP  Glucose 70 - 99 mg/dL 84  89  893   BUN 6 - 20 mg/dL 5  5  6  Creatinine 0.61 - 1.24 mg/dL 9.25  9.21  9.13   Sodium 135 - 145 mmol/L 139  139  138   Potassium 3.5 - 5.1 mmol/L 3.7  3.8  3.7   Chloride 98 - 111 mmol/L 108  107  108   CO2 22 - 32 mmol/L 23  23  22    Calcium  8.9 - 10.3 mg/dL 8.6  8.6  8.7     No results found.   ASSESSMENT/PLAN:  Assessment: Principal Problem:   MSSA bacteremia Active Problems:   Human immunodeficiency virus (HIV) disease (HCC)   Alcohol use disorder, moderate, dependence (HCC)   Skin ulcers of foot, bilateral (HCC), R> L, painful   Eczematous dermatitis   Cellulitis of right leg   Staphylococcus aureus bacteremia   Dilatation of aorta ~19mm  (HCC)  William [name] is a 63 y.o. with a pertinent PMH of HIV on Biktarvy , atopic dermatitis, hyperlipidemia, who presented with right leg pain and admitted for wound infection. Blood cultures positive for MSSA.    Plan: #Right Leg Cellulitis  #MSSA Bacteremia  #Eczematous dermatitis  #Right Lower extremity Swelling  Presented with Right Leg pain, superficial blisters with erythema, and draining purulent fluid. Patient has chronic pruritus and excoriations are recognized risk  factors for recurrent skin and soft tissue infections Patient has not had fevers or leukocytosis. RLE swelling- VAS US  LE and VAS US  ABI with/without TBI complete. No DVT seen, but limited exam with bandaging. + for enlarged lymph node on right, right toe ABI normal.  -wound care consulting -IV ancef  per ID for MSSA bacteremia and cellulitis  -Blood cultures from 7/10 no growth for 3 days.  -clobetasol  ointment and PRN benadryl  for pruritus  -TTE = LV EF 60-65%; mild left ventriclar hypertrophy, trivial mitral and tricuspid regurg -TEE scheduled for 7/14 -PT consulted - Patient has been ambulating   #HIV CD4 count 288, Not necessary to start any prophylactic therapy.  Patient also has history of possible non adherence to antiretroviral therapy. -Biktarvy   #Hyperlipidemia  LDL 122 -Rosuvastatin  10 mg  #Hypertension  Home regimen includes hydrochlorothiazide  25 mg, but patient is noncompliant per H&P. BP stable  -holding BP meds for now   #Alcohol Use Disorder  Last alcohol was 2-3 weeks ago. No signs of withdrawal, low concern for withdrawal  #Cigarette Smoking   -nicotine  patch   Best Practice: Diet: Regular diet VTE: enoxaparin  (LOVENOX ) injection 40 mg Start: 08/16/23 1045 Code: Full  Disposition planning: disposition pending follow up bacteremia cultures  Signature:  Sallyanne Benuel [facility]  12:29 PM, 08/20/2023  On Call pager [phone]

## 2023-08-21 ENCOUNTER — Inpatient Hospital Stay (HOSPITAL_COMMUNITY): Admitting: Anesthesiology

## 2023-08-21 ENCOUNTER — Encounter (HOSPITAL_COMMUNITY)
Admission: EM | Disposition: A | Discharge status: Home Health | Payer: Self-pay | Source: Home / Self Care | Attending: Internal Medicine

## 2023-08-21 ENCOUNTER — Other Ambulatory Visit (HOSPITAL_COMMUNITY)

## 2023-08-21 LAB — CBC
HCT: 39.3 % (ref 39.0–52.0)
Hemoglobin: 13 g/dL (ref 13.0–17.0)
MCH: 28.7 pg (ref 26.0–34.0)
MCHC: 33.1 g/dL (ref 30.0–36.0)
MCV: 86.8 fL (ref 80.0–100.0)
Platelets: 296 K/uL (ref 150–400)
RBC: 4.53 MIL/uL (ref 4.22–5.81)
RDW: 13.8 % (ref 11.5–15.5)
WBC: 6.2 K/uL (ref 4.0–10.5)
nRBC: 0 % (ref 0.0–0.2)

## 2023-08-21 LAB — CULTURE, BLOOD (ROUTINE X 2)
Culture: NO GROWTH
Special Requests: ADEQUATE

## 2023-08-21 SURGERY — TRANSESOPHAGEAL ECHOCARDIOGRAM (TEE) (CATHLAB)
Anesthesia: Monitor Anesthesia Care

## 2023-08-21 NOTE — Progress Notes (Addendum)
 Mobility Specialist Progress Note:    08/21/23 1017  Mobility  Activity Ambulated with assistance in hallway  Level of Assistance Modified independent, requires aide device or extra time  Assistive Device None  Distance Ambulated (ft) 600 ft  Activity Response Tolerated well  Mobility Referral Yes  Mobility visit 1 Mobility  Mobility Specialist Start Time (ACUTE ONLY) 0909  Mobility Specialist Stop Time (ACUTE ONLY) 0919  Mobility Specialist Time Calculation (min) (ACUTE ONLY) 10 min   Received pt in bed and agreeable to mobility. No physical assistance required. No c/o. Pt's SPO2 stayed between 96%-97% during ambulation, no SOB. Returned pt to room without fault. Pt left in bed with personal belongings and call light within reach. All needs met.   William [name] Mobility Specialist  Please contact via [employer] or  Rehab Office [phone]

## 2023-08-21 NOTE — Plan of Care (Signed)

## 2023-08-21 NOTE — Progress Notes (Signed)
 HD#5 SUBJECTIVE:  Patient Summary: William [name] is a 63 y.o. with a pertinent PMH of HIV on Biktarvy , atopic dermatitis, hyperlipidemia, who presented with right leg pain and admitted for wound infection. Blood cultures + for MSSA.   Overnight Events and Interim Events: No overnight events. Patient's niece at bedside as well. Patient stated that he had no complaints. Patient also stated he would be moving in with niece after hospitalization until he can get his own place; his niece verified.   OBJECTIVE:  Vital Signs: Vitals:   08/20/23 0806 08/20/23 1922 08/21/23 0356 08/21/23 0823  BP: 106/85 132/88 (!) 125/96 134/85  Pulse: 72 72 67 64  Resp:  16 16 17   Temp: 98.3 F (36.8 C) 98.7 F (37.1 C) 98.1 F (36.7 C) 97.8 F (36.6 C)  TempSrc: Oral Oral Oral Oral  SpO2: 94% 96% 95% 97%  Weight:      Height:       Supplemental O2: Room Air SpO2: 97 % FiO2 (%): 21 %  Filed Weights   08/16/23 1151  Weight: 83.9 kg     Intake/Output Summary (Last 24 hours) at 08/21/2023 1309 Last data filed at 08/21/2023 9378 Gross per 24 hour  Intake 200 ml  Output 2075 ml  Net -1875 ml   Net IO Since Admission: -15,013.55 mL [08/21/23 1309]  Physical Exam: Physical Exam Cardiovascular:     Rate and Rhythm: Normal rate and regular rhythm.     Heart sounds: Normal heart sounds.  Pulmonary:     Effort: Pulmonary effort is normal.     Breath sounds: Normal breath sounds.  Abdominal:     General: Abdomen is flat.     Palpations: Abdomen is soft.  Skin:    General: Skin is warm and dry.     Coloration: Skin is ashen.     Comments: Patient has RLL and LLL bandaged.   Neurological:     Mental Status: He is alert.     Patient Lines/Drains/Airways Status     Active Line/Drains/Airways     Name Placement date Placement time Site Days   Peripheral IV 08/16/23 22 G 1.75 Anterior;Left Forearm 08/16/23  1312  Forearm  1   Wound / Incision (Open or Dehisced) 02/06/23 Venous stasis  ulcer Pretibial Left open areas r/t vascular disease 02/06/23  0013  Pretibial  192   Wound / Incision (Open or Dehisced) 02/05/23 Venous stasis ulcer Foot Anterior;Right dry flaky w/ one area thats pink w/ scab on top 02/05/23  2000  Foot  193   Wound 08/16/23 Other (Comment) Leg Left;Right 08/16/23  --  Leg  1            Pertinent labs and imaging:      Latest Ref Rng & Units 08/21/2023    6:12 AM 08/20/2023    5:54 AM 08/19/2023    5:16 AM  CBC  WBC 4.0 - 10.5 K/uL 6.2  6.0  5.8   Hemoglobin 13.0 - 17.0 g/dL 86.9  86.9  87.5   Hematocrit 39.0 - 52.0 % 39.3  40.2  38.7   Platelets 150 - 400 K/uL 296  286  281        Latest Ref Rng & Units 08/19/2023    5:16 AM 08/18/2023    6:40 AM 08/17/2023    5:32 AM  CMP  Glucose 70 - 99 mg/dL 84  89  893   BUN 6 - 20 mg/dL 5  5  6  Creatinine 0.61 - 1.24 mg/dL 9.25  9.21  9.13   Sodium 135 - 145 mmol/L 139  139  138   Potassium 3.5 - 5.1 mmol/L 3.7  3.8  3.7   Chloride 98 - 111 mmol/L 108  107  108   CO2 22 - 32 mmol/L 23  23  22    Calcium  8.9 - 10.3 mg/dL 8.6  8.6  8.7     No results found.   ASSESSMENT/PLAN:  Assessment: Principal Problem:   MSSA bacteremia Active Problems:   Human immunodeficiency virus (HIV) disease (HCC)   Alcohol use disorder, moderate, dependence (HCC)   Skin ulcers of foot, bilateral (HCC), R> L, painful   Eczematous dermatitis   Cellulitis of right leg   Staphylococcus aureus bacteremia   Dilatation of aorta ~43mm  (HCC)  William [name] is a 63 y.o. with a pertinent PMH of HIV on Biktarvy , atopic dermatitis, hyperlipidemia, who presented with right leg pain and admitted for wound infection. Blood cultures positive for MSSA.    Plan: #Right Leg Cellulitis  #MSSA Bacteremia  #Eczematous dermatitis  #Right Lower extremity Swelling  Presented with Right Leg pain, superficial blisters with erythema, and draining purulent fluid. Patient has chronic pruritus and excoriations are recognized risk  factors for recurrent skin and soft tissue infections Patient has not had fevers or leukocytosis. RLE swelling- VAS US  LE and VAS US  ABI with/without TBI complete. No DVT seen, but limited exam with bandaging. + for enlarged lymph node on right, right toe ABI normal.  -IV ancef  per ID for MSSA bacteremia and cellulitis, Blood cultures from 7/10 no growth for 3 days.   -TEE scheduled for 7/14. Awaiting results. Will transition to 2 weeks IV outpatient antibiotics per Dr. Dennise if TEE negative (No history of IVDU) -wound care consulting -clobetasol  ointment and PRN benadryl  for pruritus  -TTE = LV EF 60-65%; mild left ventriclar hypertrophy, trivial mitral and tricuspid regurg -PT consulted - Patient has been ambulating   #HIV CD4 count 288, Not necessary to start any prophylactic therapy.  Patient also has history of possible non adherence to antiretroviral therapy. -Biktarvy   #Hyperlipidemia  LDL 122 -Rosuvastatin  10 mg  #Hypertension  Home regimen includes hydrochlorothiazide  25 mg, but patient is noncompliant per H&P. BP stable  -holding BP meds for now   #Alcohol Use Disorder  Last alcohol was 2-3 weeks ago. No signs of withdrawal, low concern for withdrawal  #Cigarette Smoking   -nicotine  patch   Best Practice: Diet: Regular diet VTE: enoxaparin  (LOVENOX ) injection 40 mg Start: 08/16/23 1045 Code: Full  Disposition planning: disposition pending TEE and transition to outpatient Abx  Signature:  Sallyanne Primas, DO [facility]  1:09 PM, 08/21/2023  On Call pager [phone]

## 2023-08-22 ENCOUNTER — Encounter (HOSPITAL_COMMUNITY): Payer: Self-pay | Admitting: Cardiovascular Disease

## 2023-08-22 ENCOUNTER — Other Ambulatory Visit: Payer: Self-pay

## 2023-08-22 ENCOUNTER — Encounter (HOSPITAL_COMMUNITY)
Admission: EM | Disposition: A | Discharge status: Home Health | Payer: Self-pay | Source: Home / Self Care | Attending: Internal Medicine

## 2023-08-22 ENCOUNTER — Inpatient Hospital Stay (HOSPITAL_COMMUNITY): Admitting: Anesthesiology

## 2023-08-22 ENCOUNTER — Inpatient Hospital Stay (HOSPITAL_COMMUNITY)

## 2023-08-22 DIAGNOSIS — Z87891 Personal history of nicotine dependence: Secondary | ICD-10-CM

## 2023-08-22 DIAGNOSIS — I10 Essential (primary) hypertension: Secondary | ICD-10-CM

## 2023-08-22 DIAGNOSIS — I361 Nonrheumatic tricuspid (valve) insufficiency: Secondary | ICD-10-CM | POA: Diagnosis not present

## 2023-08-22 DIAGNOSIS — B9561 Methicillin susceptible Staphylococcus aureus infection as the cause of diseases classified elsewhere: Secondary | ICD-10-CM | POA: Diagnosis not present

## 2023-08-22 DIAGNOSIS — L209 Atopic dermatitis, unspecified: Secondary | ICD-10-CM

## 2023-08-22 DIAGNOSIS — R7881 Bacteremia: Secondary | ICD-10-CM | POA: Diagnosis not present

## 2023-08-22 HISTORY — PX: TRANSESOPHAGEAL ECHOCARDIOGRAM (CATH LAB): EP1270

## 2023-08-22 LAB — CULTURE, BLOOD (ROUTINE X 2)
Culture: NO GROWTH
Culture: NO GROWTH
Special Requests: ADEQUATE
Special Requests: ADEQUATE

## 2023-08-22 LAB — ECHO TEE

## 2023-08-22 SURGERY — TRANSESOPHAGEAL ECHOCARDIOGRAM (TEE) (CATHLAB)
Anesthesia: Monitor Anesthesia Care

## 2023-08-22 MED ORDER — PROPOFOL 500 MG/50ML IV EMUL
INTRAVENOUS | Status: DC | PRN
  Administered 2023-08-22: 125 ug/kg/min via INTRAVENOUS

## 2023-08-22 MED ORDER — SODIUM CHLORIDE 0.9% FLUSH
10.0000 mL | INTRAVENOUS | Status: DC | PRN
  Administered 2023-08-22: 10 mL

## 2023-08-22 MED ORDER — CHLORHEXIDINE GLUCONATE CLOTH 2 % EX PADS
6.0000 | MEDICATED_PAD | Freq: Every day | CUTANEOUS | Status: DC
  Administered 2023-08-22: 6 via TOPICAL

## 2023-08-22 MED ORDER — HEPARIN SOD (PORK) LOCK FLUSH 100 UNIT/ML IV SOLN
250.0000 [IU] | INTRAVENOUS | Status: AC | PRN
  Administered 2023-08-22: 250 [IU]

## 2023-08-22 MED ORDER — LIDOCAINE 2% (20 MG/ML) 5 ML SYRINGE
INTRAMUSCULAR | Status: DC | PRN
  Administered 2023-08-22: 60 mg via INTRAVENOUS

## 2023-08-22 MED ORDER — SODIUM CHLORIDE 0.9 % IV SOLN: INTRAVENOUS | Status: DC | PRN

## 2023-08-22 MED ORDER — CEFAZOLIN IV (FOR PTA / DISCHARGE USE ONLY): 2.0000 g | Freq: Three times a day (TID) | INTRAVENOUS | 0 refills | Status: DC

## 2023-08-22 MED ORDER — SILVER SULFADIAZINE 1 % EX CREA
1.0000 | TOPICAL_CREAM | Freq: Every day | CUTANEOUS | Status: DC
  Filled 2023-08-22: qty 85

## 2023-08-22 NOTE — Anesthesia Preprocedure Evaluation (Addendum)
 Anesthesia Evaluation  Patient identified by MRN, date of birth, ID band Patient awake    Reviewed: Allergy & Precautions, H&P , NPO status , Patient's Chart, lab work & pertinent test results  Airway Mallampati: II  TM Distance: >3 FB Neck ROM: Full    Dental no notable dental hx. (+) Edentulous Upper, Edentulous Lower, Dental Advisory Given   Pulmonary former smoker   Pulmonary exam normal breath sounds clear to auscultation       Cardiovascular hypertension,  Rhythm:Regular Rate:Normal     Neuro/Psych negative neurological ROS  negative psych ROS   GI/Hepatic negative GI ROS, Neg liver ROS,,,  Endo/Other  negative endocrine ROS    Renal/GU negative Renal ROS  negative genitourinary   Musculoskeletal   Abdominal   Peds  Hematology  (+) HIV  Anesthesia Other Findings   Reproductive/Obstetrics negative OB ROS                              Anesthesia Physical Anesthesia Plan  ASA: 2  Anesthesia Plan: MAC   Post-op Pain Management: Minimal or no pain anticipated   Induction: Intravenous  PONV Risk Score and Plan: 1 and Propofol  infusion  Airway Management Planned: Natural Airway and Simple Face Mask  Additional Equipment:   Intra-op Plan:   Post-operative Plan:   Informed Consent: I have reviewed the patients History and Physical, chart, labs and discussed the procedure including the risks, benefits and alternatives for the proposed anesthesia with the patient or authorized representative who has indicated his/her understanding and acceptance.     Dental advisory given  Plan Discussed with: CRNA  Anesthesia Plan Comments:          Anesthesia Quick Evaluation

## 2023-08-22 NOTE — Plan of Care (Signed)

## 2023-08-22 NOTE — Anesthesia Postprocedure Evaluation (Signed)
 Anesthesia Post Note  Patient: William [name]  Procedure(s) Performed: TRANSESOPHAGEAL ECHOCARDIOGRAM     Patient location during evaluation: Cath Lab Anesthesia Type: MAC Level of consciousness: awake and alert Pain management: pain level controlled Vital Signs Assessment: post-procedure vital signs reviewed and stable Respiratory status: spontaneous breathing, nonlabored ventilation and respiratory function stable Cardiovascular status: stable and blood pressure returned to baseline Postop Assessment: no apparent nausea or vomiting Anesthetic complications: no   No notable events documented.  Last Vitals:  Vitals:   08/22/23 0940 08/22/23 0945  BP: (!) 128/94 (!) 126/98  Pulse: (!) 55 60  Resp: 13 17  Temp:    SpO2: 98% 96%    Last Pain:  Vitals:   08/22/23 0940  TempSrc:   PainSc: 0-No pain                 Beatriz Settles,W. EDMOND

## 2023-08-22 NOTE — Discharge Instructions (Addendum)
 Thank you for allowing us  to be part of your care. You were hospitalized for 6 days for bacterial infection of your leg and bacterial infection in your blood. You were given antibiotics for both of these infections (cefazolin , also known as Ancef ). Because of your infection in the blood, you had to get a Trans-esophogeal echo to look at your heart. This exam did no show bacterial growth on your heart.   See the changes in your medications and management of your chronic conditions below:  *For your Bacterial Infection -You will continue the antibiotic (cefazolin , also known as Ancef ) for 2 weeks from 7/10 (meaning you will stop antibiotics on 7/24). Because of how long you are requiring the antibiotics, you received a PICC line (like an IV, but one you can leave the hospital with) -Follow the infectious disease instructions about how to administer the antibiotic.  -you will need weekly labs drawn for CBC/D and BMP, ESR and CRP at the internal medicine residency clinic    *For your HIV -continue your Biktarvy     FOLLOW UP APPOINTMENTS: Please follow up with the Internal Medicine Residency clinic on  --7/22 at 9:15 am (doctor visit and lab work)  --7/29 at 9:30 am (lab work)  Please visit Dr. Dennise (Infectious Disease Doctor) on 8/18  Please make sure to follow up with your doctors after you leave the hospital.   Please call your PCP or our clinic if you have any questions or concerns, we may be able to help and keep you from a long and expensive emergency room wait. Our clinic and after hours phone number is [phone]. The best time to call is Monday through Friday 9 am to 4 pm but there is always someone available 24/7 if you have an emergency. If you need medication refills please notify your pharmacy one week in advance and they will send us  a request.   We are glad you are feeling better,  Sallyanne Primas, DO Internal Medicine Inpatient Teaching Service at [facility]

## 2023-08-22 NOTE — Transfer of Care (Signed)
 Immediate Anesthesia Transfer of Care Note  Patient: William [name]  Procedure(s) Performed: TRANSESOPHAGEAL ECHOCARDIOGRAM  Patient Location: PACU  Anesthesia Type:MAC  Level of Consciousness: drowsy  Airway & Oxygen Therapy: Patient Spontanous Breathing and Patient connected to nasal cannula oxygen  Post-op Assessment: Report given to RN and Post -op Vital signs reviewed and stable  Post vital signs: Reviewed and stable  Last Vitals:  Vitals Value Taken Time  BP    Temp    Pulse    Resp    SpO2      Last Pain:  Vitals:   08/22/23 0845  TempSrc:   PainSc: 0-No pain      Patients Stated Pain Goal: 3 (08/16/23 1231)  Complications: No notable events documented.

## 2023-08-22 NOTE — Progress Notes (Signed)
 Peripherally Inserted Central Catheter Placement  The IV Nurse has discussed with the patient and/or persons authorized to consent for the patient, the purpose of this procedure and the potential benefits and risks involved with this procedure.  The benefits include less needle sticks, lab draws from the catheter, and the patient may be discharged home with the catheter. Risks include, but not limited to, infection, bleeding, blood clot (thrombus formation), and puncture of an artery; nerve damage and irregular heartbeat and possibility to perform a PICC exchange if needed/ordered by physician.  Alternatives to this procedure were also discussed.  Bard Power PICC patient education guide, fact sheet on infection prevention and patient information card has been provided to patient /or left at bedside.    PICC Placement Documentation  PICC Single Lumen 08/22/23 Right Brachial 41 cm 1 cm (Active)  Indication for Insertion or Continuance of Line Home intravenous therapies (PICC only) 08/22/23 1454  Exposed Catheter (cm) 1 cm 08/22/23 1454  Site Assessment Clean, Dry, Intact 08/22/23 1454  Line Status Flushed;Blood return noted;Saline locked 08/22/23 1454  Dressing Type Transparent;Securing device 08/22/23 1454  Dressing Status Antimicrobial disc/dressing in place;Clean, Dry, Intact 08/22/23 1454  Line Care Connections checked and tightened 08/22/23 1454  Line Adjustment (NICU/IV Team Only) No 08/22/23 1454  Dressing Intervention Adhesive placed at insertion site (IV team only) 08/22/23 1454  Dressing Change Due 08/29/23 08/22/23 1454       Bonni Rock Larve 08/22/2023, 2:57 PM

## 2023-08-22 NOTE — Progress Notes (Signed)
 [facility]  Date of Admission:  08/16/2023   Total days of inpatient antibiotics 6  Principal Problem:   MSSA bacteremia Active Problems:   Human immunodeficiency virus (HIV) disease (HCC)   Alcohol use disorder, moderate, dependence (HCC)   Skin ulcers of foot, bilateral (HCC), R> L, painful   Eczematous dermatitis   Cellulitis of right leg   Staphylococcus aureus bacteremia   Dilatation of aorta ~22mm  [facility])          Assessment: 63 year old male with HIV on Biktarvy  well-controlled, atopic dermatitis  on topical steroids admitted with MSSA bacteremia #MSSA bacteremia likely secondary to extremities in the setting of atopic dermatitis - TTE without vegetation, TEE without vegetation as well - Patient tolerating cefazolin  HIV, #HIV - Viral load 60 on 08/17/2023, CD4 288 on 7/9 - Continue Biktarvy  - Follows with Dr. Alena at our [facility]  Recommendations: -Continue cefazolin  to complete 2 weeks of antibiotics from negative blood pressures on 7/10 EOT 7/24 - Place PICC - Follow-up with infectious disease to review labs off of antibiotics - ID will sign off - Communicated to primary  OPAT ORDERS:  Diagnosis: MSSAB 2/2 skin lesions  Culture Result: MSSA  No Known Allergies   Discharge antibiotics to be given via PICC line:  Per pharmacy protocol Cefazolin  2 gm IV q 8 hours     Duration: 2 weeks End Date: 08/31/23  [facility] Care Per Protocol with [employer] Use: Home health RN for IV administration and teaching, line care and labs.    Labs weekly while on IV antibiotics: __x CBC with differential __ BMP **TWICE WEEKLY ON VANCOMYCIN   x__ CMP __ CRP __ ESR __ Vancomycin  trough TWICE WEEKLY __ CK  _x_ Please pull PIC at completion of IV antibiotics __ Please leave PIC in place until doctor has seen patient or been notified  Fax weekly labs to [phone]  Clinic Follow Up Appt: 8/18  @ [facility] with dr dennise    Microbiology:    Antibiotics: Cefazolin  7/9-present Biktarvy   Cultures: Blood 7/91/2, 1 out of 4 bottles growing MSSA 7/10 no growth Urine  Other   SUBJECTIVE: Afebrile overnight.  Resting in bed. Interval:   Review of Systems: Review of Systems  All other systems reviewed and are negative.    Scheduled Meds:  bictegravir-emtricitabine -tenofovir  AF  1 tablet Oral Daily   clobetasol  ointment  1 Application Topical BID   enoxaparin  (LOVENOX ) injection  40 mg Subcutaneous Q24H   nicotine   7 mg Transdermal Daily   rosuvastatin   10 mg Oral Daily   silver  sulfADIAZINE   1 Application Topical Daily   Continuous Infusions:   ceFAZolin  (ANCEF ) IV 2 g (08/22/23 0537)   PRN Meds:.acetaminophen  **OR** acetaminophen , diphenhydrAMINE , menthol -cetylpyridinium, polyethylene glycol No Known Allergies  OBJECTIVE: Vitals:   08/22/23 0930 08/22/23 0935 08/22/23 0940 08/22/23 0945  BP: (!) 131/94 (!) 133/94 (!) 128/94 (!) 126/98  Pulse: (!) 54 (!) 55 (!) 55 60  Resp: 15 15 13 17   Temp:      TempSrc:      SpO2: 98% 97% 98% 96%  Weight:      Height:       Body mass index is 25.8 kg/m.  Physical Exam Constitutional:      General: He is not in acute distress.    Appearance: He is normal weight. He is not toxic-appearing.  HENT:     Head: Normocephalic and atraumatic.  Right Ear: External ear normal.     Left Ear: External ear normal.     Nose: No congestion or rhinorrhea.     Mouth/Throat:     Mouth: Mucous membranes are moist.     Pharynx: Oropharynx is clear.  Eyes:     Extraocular Movements: Extraocular movements intact.     Conjunctiva/sclera: Conjunctivae normal.     Pupils: Pupils are equal, round, and reactive to light.  Cardiovascular:     Rate and Rhythm: Normal rate and regular rhythm.     Heart sounds: No murmur heard.    No friction rub. No gallop.  Pulmonary:     Effort: Pulmonary effort is normal.     Breath sounds: Normal breath sounds.  Abdominal:     General:  Abdomen is flat. Bowel sounds are normal.     Palpations: Abdomen is soft.  Musculoskeletal:        General: No swelling. Normal range of motion.     Cervical back: Normal range of motion and neck supple.     Comments: Lower extremity bandaged  Skin:    General: Skin is warm and dry.  Neurological:     General: No focal deficit present.     Mental Status: He is oriented to person, place, and time.  Psychiatric:        Mood and Affect: Mood normal.       Lab Results Lab Results  Component Value Date   WBC 6.2 08/21/2023   HGB 13.0 08/21/2023   HCT 39.3 08/21/2023   MCV 86.8 08/21/2023   PLT 296 08/21/2023    Lab Results  Component Value Date   CREATININE 0.74 08/19/2023   BUN 5 (L) 08/19/2023   NA 139 08/19/2023   K 3.7 08/19/2023   CL 108 08/19/2023   CO2 23 08/19/2023    Lab Results  Component Value Date   ALT 38 08/16/2023   AST 39 08/16/2023   ALKPHOS 52 08/16/2023   BILITOT 0.8 08/16/2023        Loney Stank, MD [facility] 08/22/2023, 1:34 PM

## 2023-08-22 NOTE — TOC Transition Note (Signed)
 Transition of Care [facility]) - Discharge Note   Patient Details  Name: William [name] MRN: [id number] Date of Birth: 02/28/1960  Transition of Care [facility] CM/SW Contact:  Roxie KANDICE Stain, RN Phone Number: 08/22/2023, 4:17 PM   Clinical Narrative:    Patient stable for discharge.  Pam with [facility] will teach and provide home iv abx.  Amy with [facility] will service for home health RN. Bedside RN to teach patient and brother, Jerilynn, how to do dressing change.  Address, Phone number and PCP verified.   Final next level of care: Home w Home Health Services Barriers to Discharge: Barriers Resolved   Patient Goals and CMS Choice Patient states their goals for this hospitalization and ongoing recovery are:: stay with brother CMS Medicare.gov Compare Post Acute Care list provided to:: Patient Choice offered to / list presented to : Patient      Discharge Placement             Home          Discharge Plan and Services Additional resources added to the After Visit Summary for     Discharge Planning Services: CM Consult Post Acute Care Choice: Home Health                    HH Arranged: RN [facility] Agency: [facility] Date [facility] Agency Contacted: 08/22/23 Time HH Agency Contacted: 1601 Representative spoke with at [facility] Agency: Amy  Social Drivers of Health (SDOH) Interventions SDOH Screenings   Food Insecurity: No Food Insecurity (08/16/2023)  Housing: Low Risk  (08/16/2023)  Transportation Needs: No Transportation Needs (08/16/2023)  Utilities: Not At Risk (08/16/2023)  Alcohol Screen: Low Risk  (03/15/2022)  Depression (PHQ2-9): Low Risk  (11/10/2022)  Financial Resource Strain: Low Risk  (03/15/2022)  Physical Activity: Insufficiently Active (03/15/2022)  Social Connections: Socially Isolated (08/16/2023)  Stress: No Stress Concern Present (08/17/2020)  Tobacco Use: Medium Risk (08/16/2023)     Readmission Risk Interventions    08/22/2023    4:17 PM  Readmission Risk  Prevention Plan  Post Dischage Appt Complete  Medication Screening Complete  Transportation Screening Complete

## 2023-08-22 NOTE — Progress Notes (Signed)
 PHARMACY CONSULT NOTE FOR:  OUTPATIENT  PARENTERAL ANTIBIOTIC THERAPY (OPAT)  Indication: MSSA bacteremia Regimen: Cefazolin  2 gm IV q 8 hours  End date: 08/31/23  IV antibiotic discharge orders are pended. To discharging provider:  please sign these orders via discharge navigator,  Select New Orders & click on the button choice - Manage This Unsigned Work.     Thank you for allowing pharmacy to be a part of this patient's care.  Damien Quiet, PharmD, BCPS, BCIDP Infectious Diseases Clinical Pharmacist Phone: [phone] 08/22/2023, 11:41 AM

## 2023-08-22 NOTE — Progress Notes (Signed)
  Echocardiogram Echocardiogram Transesophageal has been performed.  Devora Ellouise SAUNDERS 08/22/2023, 9:11 AM

## 2023-08-22 NOTE — Hospital Course (Addendum)
 Right Leg Cellulitis  MSSA Bacteremia  Eczematous dermatitis  Right Lower extremity Swelling  Presented with Right Leg pain, superficial blisters with erythema, and draining purulent fluid. Patient has chronic pruritus and excoriations are recognized risk factors for recurrent skin and soft tissue infections Patient has not had fevers or leukocytosis. RLE swelling- VAS US  LE and VAS US  ABI with/without TBI complete. No DVT seen, but limited exam with bandaging. + for enlarged lymph node on right, right toe ABI normal. Received IV ancef  per ID for MSSA bacteremia and cellulitis, Blood cultures from 7/10 no growth for 5 days, TEE negative for vegetation. Received PICC line and transitioned to 2 weeks IV outpatient antibiotics per Dr. Dennise.    HIV CD4 count 288, Not necessary to start any prophylactic therapy.  Patient also has history of possible non adherence to antiretroviral therapy. Placed on home Biktarvy  during admit.

## 2023-08-22 NOTE — Interval H&P Note (Signed)
 History and Physical Interval Note:  08/22/2023 8:23 AM  Ellender FORBES Fines  has presented today for surgery, with the diagnosis of bacteremia.  The various methods of treatment have been discussed with the patient and family. After consideration of risks, benefits and other options for treatment, the patient has consented to  Procedure(s): TRANSESOPHAGEAL ECHOCARDIOGRAM (N/A) as a surgical intervention.  The patient's history has been reviewed, patient examined, no change in status, stable for surgery.  I have reviewed the patient's chart and labs.  Questions were answered to the patient's satisfaction.     Maude Emmer

## 2023-08-22 NOTE — CV Procedure (Signed)
 TEE: Anesthesia: Propofol   No SBE/Vegetations Trivial MR Tri leaflet AV Mild TR Trivial PR Small PFO by color flow No effusion No LAA thrombus Mild aortic root dilatation 4.0 cm No effusion  See full report in [facility]  Maude Emmer MD [facility]

## 2023-08-22 NOTE — Discharge Summary (Signed)
 Name: William [name] MRN: [id number] DOB: [birth year] 63 y.o. PCP: Harrie Bruckner, DO  Date of Admission: 08/16/2023  7:34 AM Date of Discharge: August 22, 2023 Attending Physician: Dr. Dayton Eastern  Discharge Diagnosis: 1. Principal Problem:   MSSA bacteremia Active Problems:   Human immunodeficiency virus (HIV) disease (HCC)   Alcohol use disorder, moderate, dependence (HCC)   Skin ulcers of foot, bilateral (HCC), R> L, painful   Eczematous dermatitis   Cellulitis of right leg   Staphylococcus aureus bacteremia   Dilatation of aorta ~78mm  (HCC)   Discharge Medications: Allergies as of 08/22/2023   No Known Allergies      Medication List     TAKE these medications    Acetaminophen  Extra Strength 500 MG Tabs Take 2 tablets (1,000 mg total) by mouth every 8 (eight) hours as needed. Do not take if not in pain. What changed:  reasons to take this additional instructions   bictegravir-emtricitabine -tenofovir  AF 50-200-25 MG Tabs tablet Commonly known as: BIKTARVY  Take 1 tablet by mouth daily.   ceFAZolin  IVPB Commonly known as: ANCEF  Inject 2 g into the vein every 8 (eight) hours for 9 days. Indication:  MSSA Bacteremia First Dose: Yes Last Day of Therapy:  08/31/23 Labs - Once weekly:  CBC/D and BMP, Labs - Once weekly: ESR and CRP Method of administration: IV Push Method of administration may be changed at the discretion of home infusion pharmacist based upon assessment of the patient and/or caregiver's ability to self-administer the medication ordered.   clobetasol  ointment 0.05 % Commonly known as: TEMOVATE  Apply 1 Application topically 2 (two) times daily.   hydrochlorothiazide  25 MG tablet Commonly known as: HYDRODIURIL  Take 1 tablet (25 mg total) by mouth daily.   rosuvastatin  10 MG tablet Commonly known as: Crestor  Take 1 tablet (10 mg total) by mouth daily.   triamcinolone  ointment 0.5 % Commonly known as: KENALOG  Apply topically 3 (three)  times daily.               Discharge Care Instructions  (From admission, onward)           Start     Ordered   08/22/23 0000  Change dressing on IV access line weekly and PRN  (Home infusion instructions - [facility] )        08/22/23 1655   08/22/23 0000  Discharge wound care:       Comments: Apply Silvadene  to bilat legs and feet Q day, then cover with ABD pads and kerlex.  Remove previous Silvadene  with moist gauze each time before applying more.   08/22/23 1659            Disposition and follow-up:   Mr.Verdell E Phillippi was discharged from [facility] in Good condition.  At the hospital follow up visit please address:  1.    Right Leg Cellulitis and MSSA bacteremia - 2 weeks of Ancef  per PICC HIV -  continuing Biktarvy , Following up with ID   2.  Labs / imaging needed at time of follow-up:   ORDER THESE LABS - Once weekly (7/22 and 7/29):  CBC/D and BMP, ESR and CRP Thank you!    He is receiving  Ancef  2 g into the PICC every 8 (eight) hours for 9 days.  Indication:  MSSA Bacteremia  First Dose: Yes  Last Day of Therapy:  08/31/23   3.  Pending labs/ test needing follow-up: n/a  Follow-up Appointments:  Internal Medicine Residency  clinic on  --7/22 at 9:15 am (doctor visit and lab work)  --7/29 at 9:30 am (lab work)  Please visit Dr. Jimmie (Infectious Disease Doctor) on 8/18   Follow-up Information     [facility] Follow up.   Why: Home health RN has been arranged. They will contact you within 48hrs post discharge. Contact information: [address] [place] [address] [phone]         Harrie Bruckner, DO Follow up in 2 week(s).   Specialty: Internal Medicine Why: Hospital follow up Contact information: [address], [address] [place] [address] [phone]                  Hospital Course by problem list: William [name] is a 63 y.o. person living  with a history of HIV on Biktarvy , atopic dermatitis, hyperlipidemia who presented with right leg pain and admitted for wound infection and antibacterial treatment of Right leg cellulitis. Stay was complicated by diagnosis of MSSA bacteremia. Patient is now being discharged on hospital day 6 with the following pertinent hospital course:  Right Leg Cellulitis  MSSA Bacteremia  Eczematous dermatitis  Right Lower extremity Swelling  Presented with Right Leg pain, superficial blisters with erythema, and draining purulent fluid. Patient has chronic pruritus and excoriations are recognized risk factors for recurrent skin and soft tissue infections Patient has not had fevers or leukocytosis. RLE swelling- VAS US  LE and VAS US  ABI with/without TBI complete. No DVT seen, but limited exam with bandaging. + for enlarged lymph node on right, right toe ABI normal. Received IV ancef  per ID for MSSA bacteremia and cellulitis, Blood cultures from 7/10 no growth for 5 days, TEE negative for vegetation. Received PICC line and transitioned to 2 weeks IV outpatient antibiotics per Dr. Dennise. Home health consulted.   HIV CD4 count 288, Not necessary to start any prophylactic therapy.  Patient also has history of possible non adherence to antiretroviral therapy. Placed on home Biktarvy  during admit.   Stable chronic medical conditions: Hyperlipidemia - Rosuvastatin  10 mg Hypertension - home medications hydrochlorothiazide  25 mg Alcohol use disorder - last drink was 2-3 weeks ago from admit. No signs of withdrawal  Cigarette smoking - nicotine  patch used in admit    Discharge Exam:   BP (!) 126/98   Pulse 60   Temp 97.6 F (36.4 C) (Temporal)   Resp 17   Ht 5' 11 (1.803 m)   Wt 83.9 kg   SpO2 96%   BMI 25.80 kg/m  Discharge exam: Physical Exam Cardiovascular:     Rate and Rhythm: Normal rate and regular rhythm.     Heart sounds: Normal heart sounds.  Pulmonary:     Effort: Pulmonary effort is normal.      Breath sounds: Normal breath sounds.  Abdominal:     Palpations: Abdomen is soft.  Skin:    General: Skin is warm and dry.     Coloration: Skin is ashen.     Comments: Right and left legs bandaged  Neurological:     Mental Status: He is alert.      Pertinent Labs, Studies, and Procedures:     Latest Ref Rng & Units 08/21/2023    6:12 AM 08/20/2023    5:54 AM 08/19/2023    5:16 AM  CBC  WBC 4.0 - 10.5 K/uL 6.2  6.0  5.8   Hemoglobin 13.0 - 17.0 g/dL 86.9  86.9  12.4  Hematocrit 39.0 - 52.0 % 39.3  40.2  38.7   Platelets 150 - 400 K/uL 296  286  281        Latest Ref Rng & Units 08/19/2023    5:16 AM 08/18/2023    6:40 AM 08/17/2023    5:32 AM  CMP  Glucose 70 - 99 mg/dL 84  89  893   BUN 6 - 20 mg/dL 5  5  6    Creatinine 0.61 - 1.24 mg/dL 9.25  9.21  9.13   Sodium 135 - 145 mmol/L 139  139  138   Potassium 3.5 - 5.1 mmol/L 3.7  3.8  3.7   Chloride 98 - 111 mmol/L 108  107  108   CO2 22 - 32 mmol/L 23  23  22    Calcium  8.9 - 10.3 mg/dL 8.6  8.6  8.7     VAS US  ABI WITH/WO TBI Result Date: 08/17/2023  LOWER EXTREMITY DOPPLER STUDY Patient Name:  MARCELLUS PULLIAM  Date of Exam:   08/17/2023 Medical Rec #: [id number]          Accession #:    [id number] Date of Birth: 1960-04-11          Patient Gender: M Patient Age:   29 years Exam Location:  [facility] Procedure:      VAS US  ABI WITH/WO TBI Referring Phys: ELSIE SAVANNAH --------------------------------------------------------------------------------  Indications: Ulceration. Bilateral LE wounds and Cellulitis secondary to severe              eczema. High Risk Factors: Hyperlipidemia. Other Factors: HIV.  Limitations: Today's exam was limited due to an open wound and bandages and              edema of right foot. Comparison Study: No prior study Performing Technologist: Rachel Pellet RVS  Examination Guidelines: A complete evaluation includes at minimum, Doppler waveform signals and systolic blood pressure reading at the  level of bilateral brachial, anterior tibial, and posterior tibial arteries, when vessel segments are accessible. Bilateral testing is considered an integral part of a complete examination. Photoelectric Plethysmograph (PPG) waveforms and toe systolic pressure readings are included as required and additional duplex testing as needed. Limited examinations for reoccurring indications may be performed as noted.  ABI Findings: +---------+------------------+-----+-----------+--------+ Right    Rt Pressure (mmHg)IndexWaveform   Comment  +---------+------------------+-----+-----------+--------+ Brachial 115                    triphasic           +---------+------------------+-----+-----------+--------+ PTA      160               1.39 multiphasic         +---------+------------------+-----+-----------+--------+ DP       160               1.39 multiphasic         +---------+------------------+-----+-----------+--------+ Great Toe107               0.93 Normal              +---------+------------------+-----+-----------+--------+ +---------+------------------+-----+-----------+-------+ Left     Lt Pressure (mmHg)IndexWaveform   Comment +---------+------------------+-----+-----------+-------+ Brachial 113                    triphasic          +---------+------------------+-----+-----------+-------+ PTA      135  1.17 multiphasic        +---------+------------------+-----+-----------+-------+ DP       140               1.22 multiphasic        +---------+------------------+-----+-----------+-------+ Great Toe105               0.91 Normal             +---------+------------------+-----+-----------+-------+ +-------+----------------------+-----------+------------+------------+ ABI/TBIToday's ABI           Today's TBIPrevious ABIPrevious TBI +-------+----------------------+-----------+------------+------------+ Right  1.39 (noncompressible)0.93                                 +-------+----------------------+-----------+------------+------------+ Left   1.22                  0.91                                +-------+----------------------+-----------+------------+------------+  Summary: Right: Resting right ankle-brachial index indicates noncompressible right lower extremity arteries. The right toe-brachial index is normal. Left: Resting left ankle-brachial index is within normal range. The left toe-brachial index is normal. *See table(s) above for measurements and observations.  Electronically signed by Lonni Gaskins MD on 08/17/2023 at 4:37:21 PM.    Final    VAS US  LOWER EXTREMITY VENOUS (DVT) Result Date: 08/17/2023  Lower Venous DVT Study Patient Name:  HUIE GHUMAN  Date of Exam:   08/17/2023 Medical Rec #: [id number]          Accession #:    [id number] Date of Birth: 04/14/1960          Patient Gender: M Patient Age:   36 years Exam Location:  [facility] Procedure:      VAS US  LOWER EXTREMITY VENOUS (DVT) Referring Phys: ELSIE SAVANNAH --------------------------------------------------------------------------------  Indications: Pain, Edema, Erythema, and Severe eczema.  Limitations: Open wound and bandages from mid calf down. Comparison Study: No prior study on file Performing Technologist: Alberta Lis RVS  Examination Guidelines: A complete evaluation includes B-mode imaging, spectral Doppler, color Doppler, and power Doppler as needed of all accessible portions of each vessel. Bilateral testing is considered an integral part of a complete examination. Limited examinations for reoccurring indications may be performed as noted. The reflux portion of the exam is performed with the patient in reverse Trendelenburg.  +---------+---------------+---------+-----------+----------+-------------------+ RIGHT    CompressibilityPhasicitySpontaneityPropertiesThrombus Aging       +---------+---------------+---------+-----------+----------+-------------------+ CFV      Full           Yes      Yes                                      +---------+---------------+---------+-----------+----------+-------------------+ SFJ      Full                                                             +---------+---------------+---------+-----------+----------+-------------------+ FV Prox  Full           Yes      Yes                                      +---------+---------------+---------+-----------+----------+-------------------+  FV Mid   Full                                                             +---------+---------------+---------+-----------+----------+-------------------+ FV DistalFull                                                             +---------+---------------+---------+-----------+----------+-------------------+ PFV      Full           Yes      Yes                                      +---------+---------------+---------+-----------+----------+-------------------+ POP      Full           Yes      Yes                                      +---------+---------------+---------+-----------+----------+-------------------+ PTV      Full                                         visualized from                                                           proximal to mid                                                           portions            +---------+---------------+---------+-----------+----------+-------------------+ PERO     Full                                         visualized from                                                           proximal to mid  portions            +---------+---------------+---------+-----------+----------+-------------------+ Gastroc  Full                                                              +---------+---------------+---------+-----------+----------+-------------------+ TP trunk                                              patent              +---------+---------------+---------+-----------+----------+-------------------+   +----+---------------+---------+-----------+----------+--------------+ LEFTCompressibilityPhasicitySpontaneityPropertiesThrombus Aging +----+---------------+---------+-----------+----------+--------------+ CFV Full           Yes      Yes                                 +----+---------------+---------+-----------+----------+--------------+ SFJ Full                                                        +----+---------------+---------+-----------+----------+--------------+     Summary: RIGHT: - There is no evidence of deep vein thrombosis in the lower extremity. However, portions of this examination were limited- see technologist comments above.  - No cystic structure found in the popliteal fossa. - Ultrasound characteristics of enlarged lymph nodes are noted in the groin.  LEFT: - No evidence of common femoral vein obstruction.  - Ultrasound characteristics of enlarged lymph nodes noted in the groin.  *See table(s) above for measurements and observations. Electronically signed by Lonni Gaskins MD on 08/17/2023 at 4:37:09 PM.    Final    DG Chest 2 View Result Date: 08/16/2023 CLINICAL DATA:  Leg infection. EXAM: CHEST - 2 VIEW COMPARISON:  07/23/2013. FINDINGS: The heart size and mediastinal contours are within normal limits. No focal consolidation, pleural effusion, or pneumothorax. Similar dextrocurvature the midthoracic spine. No acute osseous abnormality. IMPRESSION: No acute cardiopulmonary findings. Electronically Signed   By: Harrietta Sherry M.D.   On: 08/16/2023 08:53     Discharge Instructions: Discharge Instructions     [facility] pharmacist to adjust dose for Vancomycin , Aminoglycosides and other anti-infective therapies  as requested by physician.   Complete by: As directed    [facility] to provide Cath Flo 2mg    Complete by: As directed    Administer for PICC line occlusion and as ordered by physician for other access device issues.   Anaphylaxis Kit: Provided to treat any anaphylactic reaction to the medication being provided to the patient if First Dose or when requested by physician   Complete by: As directed    Epinephrine  1mg /ml vial / amp: Administer 0.3mg  (0.30ml) subcutaneously once for moderate to severe anaphylaxis, nurse to call physician and pharmacy when reaction occurs and call 911 if needed for immediate care   Diphenhydramine  50mg /ml IV vial: Administer 25-50mg  IV/IM PRN for first dose reaction, rash, itching, mild reaction, nurse to call physician and pharmacy when reaction occurs   Sodium Chloride  0.9% NS 500ml IV: Administer if needed for  hypovolemic blood pressure drop or as ordered by physician after call to physician with anaphylactic reaction   Call MD for:  difficulty breathing, headache or visual disturbances   Complete by: As directed    Call MD for:  persistant dizziness or light-headedness   Complete by: As directed    Call MD for:  persistant nausea and vomiting   Complete by: As directed    Call MD for:  redness, tenderness, or signs of infection (pain, swelling, redness, odor or green/yellow discharge around incision site)   Complete by: As directed    Call MD for:  severe uncontrolled pain   Complete by: As directed    Call MD for:  temperature >100.4   Complete by: As directed    Change dressing on IV access line weekly and PRN   Complete by: As directed    Diet - low sodium heart healthy   Complete by: As directed    Discharge instructions   Complete by: As directed    Thank you for allowing us  to be part of your care. You were hospitalized for 6 days for bacterial infection of your leg and bacterial infection in your blood. You were given antibiotics for both  of these infections (cefazolin , also known as Ancef ). Because of your infection in the blood, you had to get a Trans-esophogeal echo to look at your heart. This exam did no show bacterial growth on your heart.   See the changes in your medications and management of your chronic conditions below:  *For your Bacterial Infection -You will continue the antibiotic (cefazolin , also known as Ancef ) for 2 weeks from 7/10 (meaning you will stop antibiotics on 7/24). Because of how long you are requiring the antibiotics, you received a PICC line (like an IV, but one you can leave the hospital with) -Follow the infectious disease instructions about how to administer the antibiotic.  -you will need weekly labs drawn for CBC/D and BMP, ESR and CRP at the internal medicine residency clinic    *For your HIV -continue your Biktarvy     FOLLOW UP APPOINTMENTS: Please follow up with the Internal Medicine Residency clinic on  --7/22 at 9:15 am (doctor visit and lab work)  --7/29 at 9:30 am (lab work)  Please visit Dr. Alena (Infectious Disease Doctor) on 8/18  Please make sure to follow up with your doctors after you leave the hospital.   Please call your PCP or our clinic if you have any questions or concerns, we may be able to help and keep you from a long and expensive emergency room wait. Our clinic and after hours phone number is [phone]. The best time to call is Monday through Friday 9 am to 4 pm but there is always someone available 24/7 if you have an emergency. If you need medication refills please notify your pharmacy one week in advance and they will send us  a request.   We are glad you are feeling better,  Sallyanne Primas, DO Internal Medicine Inpatient Teaching Service at [facility]   Discharge instructions   Complete by: As directed    hank you for allowing us  to be part of your care. You were hospitalized for 6 days for bacterial infection of your leg and bacterial infection in your blood.  You were given antibiotics for both of these infections (cefazolin , also known as Ancef ). Because of your infection in the blood, you had to get a Trans-esophogeal echo to look at your heart. This exam  did no show bacterial growth on your heart.   See the changes in your medications and management of your chronic conditions below:  *For your Bacterial Infection -You will continue the antibiotic (cefazolin , also known as Ancef ) for 2 weeks from 7/10 (meaning you will stop antibiotics on 7/24). Because of how long you are requiring the antibiotics, you received a PICC line (like an IV, but one you can leave the hospital with) -Follow the infectious disease instructions about how to administer the antibiotic.  -you will need weekly labs drawn for CBC/D and BMP, ESR and CRP at the internal medicine residency clinic    *For your HIV -continue your Biktarvy     FOLLOW UP APPOINTMENTS: Please follow up with the Internal Medicine Residency clinic on  --7/22 at 9:15 am (doctor visit and lab work)  --7/29 at 9:30 am (lab work)  Please visit Dr. Alena (Infectious Disease Doctor) on 8/18  Please make sure to follow up with your doctors after you leave the hospital.   Please call your PCP or our clinic if you have any questions or concerns, we may be able to help and keep you from a long and expensive emergency room wait. Our clinic and after hours phone number is [phone]. The best time to call is Monday through Friday 9 am to 4 pm but there is always someone available 24/7 if you have an emergency. If you need medication refills please notify your pharmacy one week in advance and they will send us  a request.   We are glad you are feeling better,  Sallyanne Primas, DO Internal Medicine Inpatient Teaching Service at [facility]   Discharge wound care:   Complete by: As directed    Apply Silvadene  to bilat legs and feet Q day, then cover with ABD pads and kerlex.  Remove previous Silvadene  with moist  gauze each time before applying more.   Flush IV access with Sodium Chloride  0.9% and Heparin  10 units/ml or 100 units/ml   Complete by: As directed    Home infusion instructions - [facility]   Complete by: As directed    Instructions: Flush IV access with Sodium Chloride  0.9% and Heparin  10units/ml or 100units/ml   Change dressing on IV access line: Weekly and PRN   Instructions Cath Flo 2mg : Administer for PICC Line occlusion and as ordered by physician for other access device   [facility] pharmacist to adjust dose for: Vancomycin , Aminoglycosides and other anti-infective therapies as requested by physician   Increase activity slowly   Complete by: As directed    Method of administration may be changed at the discretion of home infusion pharmacist based upon assessment of the patient and/or caregiver's ability to self-administer the medication ordered   Complete by: As directed        Signed: Yerick Eggebrecht, Sallyanne, DO 08/22/2023, 4:59 PM

## 2023-08-23 ENCOUNTER — Other Ambulatory Visit: Payer: Self-pay

## 2023-08-23 ENCOUNTER — Emergency Department (HOSPITAL_COMMUNITY)
Admission: EM | Admit: 2023-08-23 | Discharge: 2023-08-23 | Disposition: A | Discharge status: Home / Self Care | Attending: Emergency Medicine | Admitting: Emergency Medicine

## 2023-08-23 DIAGNOSIS — Z742 Need for assistance at home and no other household member able to render care: Secondary | ICD-10-CM | POA: Diagnosis not present

## 2023-08-23 DIAGNOSIS — Z79899 Other long term (current) drug therapy: Secondary | ICD-10-CM | POA: Diagnosis not present

## 2023-08-23 DIAGNOSIS — Z21 Asymptomatic human immunodeficiency virus [HIV] infection status: Secondary | ICD-10-CM | POA: Diagnosis not present

## 2023-08-23 MED ORDER — CEFAZOLIN SODIUM-DEXTROSE 1-4 GM/50ML-% IV SOLN
1.0000 g | Freq: Once | INTRAVENOUS | Status: AC
  Administered 2023-08-23: 1 g via INTRAVENOUS
  Filled 2023-08-23: qty 50

## 2023-08-23 NOTE — ED Provider Triage Note (Signed)
 Emergency Medicine Provider Triage Evaluation Note  William [name] , a 63 y.o. male  was evaluated in triage.  Pt complains of needing antibiotics.  The patient was discharged yesterday from cellulitis.  Was supposed to be getting every 8 antibiotics IV with PICC line placement.  Patient reports he has not had any antibiotics since yesterday in the hospital.  He has a PICC line and has the antibodies with them however reports that he was not educated on how to administer them.  He brings antibiotics with him for education.  Review of Systems  Positive:  Negative:   Physical Exam  BP (!) 135/100 (BP Location: Left Arm)   Pulse 69   Temp (!) 97.5 F (36.4 C)   Resp (!) 25   SpO2 98%  Gen:   Awake, no distress   Resp:  Normal effort  MSK:   Moves extremities without difficulty  Other:  Leg wrapped in bandage  Medical Decision Making  Medically screening exam initiated at 3:12 PM.  Appropriate orders placed.  William [name] was informed that the remainder of the evaluation will be completed by another provider, this initial triage assessment does not replace that evaluation, and the importance of remaining in the ED until their evaluation is complete.  Patient does not appear in any acute distress. Likely needs education.   Bernis Ernst, [place] 08/23/23 1514

## 2023-08-23 NOTE — Care Management (Addendum)
 ED RNCM consulted concerning patient who was discharged yesterday with IVP antibiotics to be self administered, returned today after [facility] nurse suggested he return for antibiotics since she was unable to assist with administering the antibiotics as per patient. CM reviewed chart and noted HH services were set up with [facility]  and IV infusion were set up with [facility]. Contacted Pam with [facility], who states she was involved in the teaching and patient seemed to have a grasp on administering his antibiotics. HH nurse was expected to come out today to assist and reinforce the administration of the medication . But according  to the patient the nurse was not able to assist him and suggested he return to the hospital.  Patient will need to receive his dose today in the ED and the Daytime ED CM will follow up in the am with [facility] to ensure a safe discharge with Home IV antibiotics.  Updated Harlene ED RN and EDP. ED Case Management will follow-up in the am.   Albert Gosling RN, BSN CNOR ED RN Care Manager [phone]

## 2023-08-23 NOTE — ED Triage Notes (Signed)
 Pt to ED via POV requesting home antibiotics, pt reports was discharged from hospital yesterday with instructions to have ABX administered into PICC Line for Bacterial infection of leg and blood. , pt reports has been unable to get abx administered.

## 2023-08-23 NOTE — ED Provider Notes (Signed)
[place] EMERGENCY DEPARTMENT AT [facility] Provider Note   CSN: [id number] Arrival date & time: 08/23/23  1234     Patient presents with: need abx   William [name] is a 63 y.o. male.   HPI   63 year old male with medical history significant for HIV and MSSA bacteremia who presents to the emergency department with need for social work consultation.  The patient was discharged from the hospital yesterday with plan for IV Ancef  every 8 hours for 9 days.  He has the medication but was not sure how to administer the medication at home.  He was set up with home health nursing but has not had a home health nurse come by yet.  He denies any new complaints.  Prior to Admission medications   Medication Sig Start Date End Date Taking? Authorizing Provider  acetaminophen  (TYLENOL ) 500 MG tablet Take 2 tablets (1,000 mg total) by mouth every 8 (eight) hours as needed. Do not take if not in pain. Patient taking differently: Take 1,000 mg by mouth every 8 (eight) hours as needed for mild pain (pain score 1-3). 02/10/23   Harrie Bruckner, DO  bictegravir-emtricitabine -tenofovir  AF (BIKTARVY ) 50-200-25 MG TABS tablet Take 1 tablet by mouth daily. 08/10/23   Vu, Constance T, MD  ceFAZolin  (ANCEF ) IVPB Inject 2 g into the vein every 8 (eight) hours for 9 days. Indication:  MSSA Bacteremia First Dose: Yes Last Day of Therapy:  08/31/23 Labs - Once weekly:  CBC/D and BMP, Labs - Once weekly: ESR and CRP Method of administration: IV Push Method of administration may be changed at the discretion of home infusion pharmacist based upon assessment of the patient and/or caregiver's ability to self-administer the medication ordered. 08/22/23 08/31/23  Elnora Ip, MD  clobetasol  ointment (TEMOVATE ) 0.05 % Apply 1 Application topically 2 (two) times daily. Patient not taking: Reported on 08/16/2023 08/10/23   [place] Constance T, MD  hydrochlorothiazide  (HYDRODIURIL ) 25 MG tablet Take 1 tablet (25 mg  total) by mouth daily. 07/14/23   Francella Rogue, MD  rosuvastatin  (CRESTOR ) 10 MG tablet Take 1 tablet (10 mg total) by mouth daily. 07/14/23   Francella Rogue, MD  triamcinolone  ointment (KENALOG ) 0.5 % Apply topically 3 (three) times daily. 07/14/23   Francella Rogue, MD    Allergies: Patient has no known allergies.    Review of Systems  All other systems reviewed and are negative.   Updated Vital Signs BP (!) 143/94 (BP Location: Left Arm)   Pulse 65   Temp 98.3 F (36.8 C)   Resp (!) 21   SpO2 100%   Physical Exam Vitals and nursing note reviewed.  Constitutional:      General: He is not in acute distress. HENT:     Head: Normocephalic and atraumatic.  Eyes:     Conjunctiva/sclera: Conjunctivae normal.     Pupils: Pupils are equal, round, and reactive to light.  Cardiovascular:     Rate and Rhythm: Normal rate and regular rhythm.  Pulmonary:     Effort: Pulmonary effort is normal. No respiratory distress.  Abdominal:     General: There is no distension.     Tenderness: There is no guarding.  Musculoskeletal:        General: No deformity or signs of injury.     Cervical back: Neck supple.  Skin:    Findings: No lesion or rash.  Neurological:     General: No focal deficit present.     Mental Status: He  is alert. Mental status is at baseline.     (all labs ordered are listed, but only abnormal results are displayed) Labs Reviewed - No data to display  EKG: None  Radiology: US  EKG SITE RITE Result Date: 08/22/2023 If Site Rite image not attached, placement could not be confirmed due to current cardiac rhythm.  US  EKG SITE RITE Result Date: 08/22/2023 If Site Rite image not attached, placement could not be confirmed due to current cardiac rhythm.  ECHO TEE Result Date: 08/22/2023    TRANSESOPHOGEAL ECHO REPORT   Patient Name:   William [name] Date of Exam: 08/22/2023 Medical Rec #:  [id number]         Height:       71.0 in Accession #:    [id number]        Weight:        185.0 lb Date of Birth:  [birth year]         BSA:          2.040 m Patient Age:    62 years          BP:           120/87 mmHg Patient Gender: M                 HR:           72 bpm. Exam Location:  Inpatient Procedure: Transesophageal Echo, Cardiac Doppler and Color Doppler (Both            Spectral and Color Flow Doppler were utilized during procedure). Indications:     Endocarditis  History:         Patient has prior history of Echocardiogram examinations, most                  recent 08/18/2023. ETOH.  Sonographer:     Ellouise Mose RDCS Referring Phys:  [id number] WADDELL A PARCELLS Diagnosing Phys: Maude Emmer MD PROCEDURE: After discussion of the risks and benefits of a TEE, an informed consent was obtained from the patient. The transesophogeal probe was passed without difficulty through the esophogus of the patient. Imaged were obtained with the patient in a left lateral decubitus position. Sedation performed by different physician. The patient was monitored while under deep sedation. Anesthestetic sedation was provided intravenously by Anesthesiology: 104mg  of Propofol , 60mg  of Lidocaine . The patient's vital signs; including heart rate, blood pressure, and oxygen saturation; remained stable throughout the procedure. The patient developed no complications during the procedure.  IMPRESSIONS  1. Left ventricular ejection fraction, by estimation, is 60 to 65%. The left ventricle has normal function. The left ventricle has no regional wall motion abnormalities.  2. Right ventricular systolic function is moderately reduced. The right ventricular size is normal.  3. Left atrial size was mildly dilated. No left atrial/left atrial appendage thrombus was detected. The LAA emptying velocity was 80 cm/s.  4. The mitral valve is normal in structure. Trivial mitral valve regurgitation. No evidence of mitral stenosis.  5. The aortic valve is tricuspid. Aortic valve regurgitation is not visualized. No aortic stenosis is present.   6. Aortic dilatation noted. There is mild dilatation of the ascending aorta, measuring 40 mm.  7. The inferior vena cava is normal in size with greater than 50% respiratory variability, suggesting right atrial pressure of 3 mmHg.  8. Evidence of atrial level shunting detected by color flow Doppler. small PFO by color flow.  9. 3D performed of the mitral valve and demonstrates  No vegations. Conclusion(s)/Recommendation(s): Normal biventricular function without evidence of hemodynamically significant valvular heart disease. No evidence of vegetation/infective endocarditis on this transesophageael echocardiogram. FINDINGS  Left Ventricle: Left ventricular ejection fraction, by estimation, is 60 to 65%. The left ventricle has normal function. The left ventricle has no regional wall motion abnormalities. The left ventricular internal cavity size was normal in size. There is  no left ventricular hypertrophy. Right Ventricle: The right ventricular size is normal. No increase in right ventricular wall thickness. Right ventricular systolic function is moderately reduced. Left Atrium: Left atrial size was mildly dilated. No left atrial/left atrial appendage thrombus was detected. The LAA emptying velocity was 80 cm/s. Right Atrium: Right atrial size was normal in size. Pericardium: There is no evidence of pericardial effusion. Mitral Valve: The mitral valve is normal in structure. Trivial mitral valve regurgitation. No evidence of mitral valve stenosis. There is no evidence of mitral valve vegetation. Tricuspid Valve: The tricuspid valve is normal in structure. Tricuspid valve regurgitation is mild . No evidence of tricuspid stenosis. There is no evidence of tricuspid valve vegetation. Aortic Valve: The aortic valve is tricuspid. Aortic valve regurgitation is not visualized. No aortic stenosis is present. There is no evidence of aortic valve vegetation. Pulmonic Valve: The pulmonic valve was normal in structure. Pulmonic  valve regurgitation is trivial. No evidence of pulmonic stenosis. Aorta: Aortic dilatation noted. There is mild dilatation of the ascending aorta, measuring 40 mm. Venous: The inferior vena cava is normal in size with greater than 50% respiratory variability, suggesting right atrial pressure of 3 mmHg. IAS/Shunts: Evidence of atrial level shunting detected by color flow Doppler. Small PFO by color flow. Additional Comments: 3D was performed not requiring image post processing on an independent workstation and was normal. Spectral Doppler performed.  AORTA Ao Root diam: 3.50 cm Ao Asc diam:  4.00 cm Maude Emmer MD Electronically signed by Maude Emmer MD Signature Date/Time: 08/22/2023/9:33:27 AM    Final    EP STUDY Result Date: 08/22/2023 See surgical note for result.    Procedures   Medications Ordered in the ED  ceFAZolin  (ANCEF ) IVPB 1 g/50 mL premix (has no administration in time range)                                    Medical Decision Making Risk Prescription drug management.    63 year old male with medical history significant for HIV and MSSA bacteremia who presents to the emergency department with need for social work consultation.  The patient was discharged from the hospital yesterday with plan for IV Ancef  every 8 hours for 9 days.  He has the medication but was not sure how to administer the medication at home.  He was set up with home health nursing but has not had a home health nurse come by yet.  On arrival, the patient was vitally stable, presenting with need for [facility] consult for home health nursing assistance.  TOC consult placed.  While the patient is in the emergency department, IV Ancef  was ordered.  TOC: Home health will be by to assist the patient with medication management in the a.m.  Patient administered dose of Ancef  and subsequently stable for discharge and outpatient follow-up.     Final diagnoses:  Need for home health care    ED Discharge Orders      None          Jerrol Agent, MD  08/23/23 2218  

## 2023-08-23 NOTE — Discharge Instructions (Signed)
 Social work has assisted you and home health will be diet to help you with the antibiotic administration tomorrow.

## 2023-08-24 DIAGNOSIS — L97521 Non-pressure chronic ulcer of other part of left foot limited to breakdown of skin: Secondary | ICD-10-CM | POA: Diagnosis not present

## 2023-08-24 DIAGNOSIS — F17210 Nicotine dependence, cigarettes, uncomplicated: Secondary | ICD-10-CM | POA: Diagnosis not present

## 2023-08-24 DIAGNOSIS — R1310 Dysphagia, unspecified: Secondary | ICD-10-CM | POA: Diagnosis not present

## 2023-08-24 DIAGNOSIS — L03115 Cellulitis of right lower limb: Secondary | ICD-10-CM | POA: Diagnosis not present

## 2023-08-24 DIAGNOSIS — L03116 Cellulitis of left lower limb: Secondary | ICD-10-CM | POA: Diagnosis not present

## 2023-08-24 DIAGNOSIS — I77819 Aortic ectasia, unspecified site: Secondary | ICD-10-CM | POA: Diagnosis not present

## 2023-08-24 DIAGNOSIS — L97511 Non-pressure chronic ulcer of other part of right foot limited to breakdown of skin: Secondary | ICD-10-CM | POA: Diagnosis not present

## 2023-08-24 DIAGNOSIS — Z452 Encounter for adjustment and management of vascular access device: Secondary | ICD-10-CM | POA: Diagnosis not present

## 2023-08-24 DIAGNOSIS — Z792 Long term (current) use of antibiotics: Secondary | ICD-10-CM | POA: Diagnosis not present

## 2023-08-24 DIAGNOSIS — I10 Essential (primary) hypertension: Secondary | ICD-10-CM | POA: Diagnosis not present

## 2023-08-24 DIAGNOSIS — A4101 Sepsis due to Methicillin susceptible Staphylococcus aureus: Secondary | ICD-10-CM | POA: Diagnosis not present

## 2023-08-24 DIAGNOSIS — K2100 Gastro-esophageal reflux disease with esophagitis, without bleeding: Secondary | ICD-10-CM | POA: Diagnosis not present

## 2023-08-24 DIAGNOSIS — E785 Hyperlipidemia, unspecified: Secondary | ICD-10-CM | POA: Diagnosis not present

## 2023-08-24 DIAGNOSIS — Z556 Problems related to health literacy: Secondary | ICD-10-CM | POA: Diagnosis not present

## 2023-08-24 DIAGNOSIS — J302 Other seasonal allergic rhinitis: Secondary | ICD-10-CM | POA: Diagnosis not present

## 2023-08-25 ENCOUNTER — Encounter: Payer: Self-pay | Admitting: Advanced Practice Midwife

## 2023-08-25 ENCOUNTER — Other Ambulatory Visit: Payer: Self-pay

## 2023-08-25 DIAGNOSIS — R1310 Dysphagia, unspecified: Secondary | ICD-10-CM | POA: Diagnosis not present

## 2023-08-25 DIAGNOSIS — A4101 Sepsis due to Methicillin susceptible Staphylococcus aureus: Secondary | ICD-10-CM | POA: Diagnosis not present

## 2023-08-25 DIAGNOSIS — I77819 Aortic ectasia, unspecified site: Secondary | ICD-10-CM | POA: Diagnosis not present

## 2023-08-25 DIAGNOSIS — F17210 Nicotine dependence, cigarettes, uncomplicated: Secondary | ICD-10-CM | POA: Diagnosis not present

## 2023-08-25 DIAGNOSIS — L97521 Non-pressure chronic ulcer of other part of left foot limited to breakdown of skin: Secondary | ICD-10-CM | POA: Diagnosis not present

## 2023-08-25 DIAGNOSIS — I10 Essential (primary) hypertension: Secondary | ICD-10-CM | POA: Diagnosis not present

## 2023-08-25 DIAGNOSIS — K2100 Gastro-esophageal reflux disease with esophagitis, without bleeding: Secondary | ICD-10-CM | POA: Diagnosis not present

## 2023-08-25 DIAGNOSIS — J302 Other seasonal allergic rhinitis: Secondary | ICD-10-CM | POA: Diagnosis not present

## 2023-08-25 DIAGNOSIS — L97511 Non-pressure chronic ulcer of other part of right foot limited to breakdown of skin: Secondary | ICD-10-CM | POA: Diagnosis not present

## 2023-08-25 DIAGNOSIS — Z556 Problems related to health literacy: Secondary | ICD-10-CM | POA: Diagnosis not present

## 2023-08-25 DIAGNOSIS — L03116 Cellulitis of left lower limb: Secondary | ICD-10-CM | POA: Diagnosis not present

## 2023-08-25 DIAGNOSIS — E785 Hyperlipidemia, unspecified: Secondary | ICD-10-CM | POA: Diagnosis not present

## 2023-08-25 DIAGNOSIS — L03115 Cellulitis of right lower limb: Secondary | ICD-10-CM | POA: Diagnosis not present

## 2023-08-25 DIAGNOSIS — Z452 Encounter for adjustment and management of vascular access device: Secondary | ICD-10-CM | POA: Diagnosis not present

## 2023-08-25 DIAGNOSIS — Z792 Long term (current) use of antibiotics: Secondary | ICD-10-CM | POA: Diagnosis not present

## 2023-08-29 ENCOUNTER — Ambulatory Visit

## 2023-08-29 VITALS — BP 141/87 | HR 66 | Temp 98.1°F | Ht 71.0 in | Wt 182.0 lb

## 2023-08-29 DIAGNOSIS — R7881 Bacteremia: Secondary | ICD-10-CM | POA: Diagnosis not present

## 2023-08-29 DIAGNOSIS — L03115 Cellulitis of right lower limb: Secondary | ICD-10-CM

## 2023-08-29 DIAGNOSIS — B9561 Methicillin susceptible Staphylococcus aureus infection as the cause of diseases classified elsewhere: Secondary | ICD-10-CM

## 2023-08-29 NOTE — Progress Notes (Signed)
 Follow up Hospitalization  [name] was admitted to hospital on 08/16/2023 and discharged on  7/15/202   . He was treated for right leg cellulitis complicated by MSSA. Treatment for this included Cefazolin  2 g via PICC. He reports excellent compliance with treatment. He reports this condition is improved.  ------------------  CC: Hospital F/U   HPI:  William [name] is a 64 y.o. male living with a history stated below and presents today for HFU for right leg cellulitis complicated by MSSA. Please see problem based assessment and plan for additional details.  Past Medical History:  Diagnosis Date   Allergy    Chronic hepatitis C without hepatic coma (HCC) 07/10/2006   His hepatitis C was treated and cured.      Folliculitis 06/02/2015   Grief 03/27/2019   HIV (human immunodeficiency virus infection) (HCC)    Hypertension    PNEUMOCYSTIS PNEUMONIA 07/10/2006   Qualifier: Diagnosis of  By: Janna MD, Burnard     Pneumothorax    Skin ulcers of foot, bilateral (HCC), R> L, painful 02/05/2023   Spontaneous pneumothorax 04/24/2013    Current Outpatient Medications on File Prior to Visit  Medication Sig Dispense Refill   acetaminophen  (TYLENOL ) 500 MG tablet Take 2 tablets (1,000 mg total) by mouth every 8 (eight) hours as needed. Do not take if not in pain. ([name] taking differently: Take 1,000 mg by mouth every 8 (eight) hours as needed for mild pain (pain score 1-3).) 30 tablet 0   bictegravir-emtricitabine -tenofovir  AF (BIKTARVY ) 50-200-25 MG TABS tablet Take 1 tablet by mouth daily. 30 tablet 11   ceFAZolin  (ANCEF ) IVPB Inject 2 g into William vein every 8 (eight) hours for 9 days. Indication:  MSSA Bacteremia First Dose: Yes Last Day of Therapy:  08/31/23 Labs - Once weekly:  CBC/D and BMP, Labs - Once weekly: ESR and CRP Method of administration: IV Push Method of administration may be changed at William discretion of home infusion pharmacist based upon assessment of William [name]  and/or caregiver's ability to self-administer William medication ordered. 27 Units 0   clobetasol  ointment (TEMOVATE ) 0.05 % Apply 1 Application topically 2 (two) times daily. ([name] not taking: Reported on 08/16/2023) 30 g 5   hydrochlorothiazide  (HYDRODIURIL ) 25 MG tablet Take 1 tablet (25 mg total) by mouth daily. 90 tablet 3   rosuvastatin  (CRESTOR ) 10 MG tablet Take 1 tablet (10 mg total) by mouth daily. 90 tablet 4   triamcinolone  ointment (KENALOG ) 0.5 % Apply topically 3 (three) times daily. 30 g 0   No current facility-administered medications on file prior to visit.    Family History  Adopted: Yes  Problem Relation Age of Onset   Heart attack Sister    Hypertension Maternal Aunt    Hypertension Other     Social History   Socioeconomic History   Marital status: Single    Spouse name: Not on file   Number of children: 0   Years of education: Not on file   Highest education level: 12th grade  Occupational History   Not on file  Tobacco Use   Smoking status: Former    Current packs/day: 0.00    Average packs/day: 0.3 packs/day    Types: Cigarettes    Start date: 04/08/2018    Quit date: 04/09/2018    Years since quitting: 5.3    Passive exposure: Past   Smokeless tobacco: Never   Tobacco comments:    Using patches. STOPPED SMOKING WHEN STARTED PATCHES. Still chews Nicotene Gum.  11/10/2022  Vaping Use   Vaping status: Never Used  Substance and Sexual Activity   Alcohol use: Yes    Alcohol/week: 0.0 standard drinks of alcohol    Comment: daily   Drug use: Not Currently    Types: Cocaine   Sexual activity: Yes    Partners: Female    Comment: declined condoms  Other Topics Concern   Not on file  Social History Narrative   Current Social History 08/17/2020        [name] lives with three roommates in an home which is 1 story There are 4 steps up to William entrance William [name] uses. There is a handrail on each side      [name]'s method of transportation is city bus.       William highest level of education was high school diploma.      William [name] currently disabled.      Identified important Relationships are: mother       Pets : none       Interests / Fun: go to [place] park and play basketball. Socialize       Current Stressors: I don't really have too much stress in my life       Religious / Personal Beliefs: Baptist       Other: I am just an outgoing person.       Social Drivers of Corporate investment banker Strain: Low Risk  (03/15/2022)   Overall Financial Resource Strain (CARDIA)    Difficulty of Paying Living Expenses: Not very hard  Food Insecurity: No Food Insecurity (08/16/2023)   Hunger Vital Sign    Worried About Running Out of Food in William Last Year: Never true    Ran Out of Food in William Last Year: Never true  Transportation Needs: No Transportation Needs (08/16/2023)   PRAPARE - Administrator, Civil Service (Medical): No    Lack of Transportation (Non-Medical): No  Physical Activity: Sufficiently Active (08/29/2023)   Exercise Vital Sign    Days of Exercise per Week: 6 days    Minutes of Exercise per Session: 40 min  Stress: No Stress Concern Present (08/17/2020)   [name]    Feeling of Stress : Not at all  Social Connections: Socially Isolated (08/16/2023)   Social Connection and Isolation Panel    Frequency of Communication with Friends and Family: Twice a week    Frequency of Social Gatherings with Friends and Family: Never    Attends Religious Services: Never    Database administrator or Organizations: No    Attends Banker Meetings: Never    Marital Status: Widowed  Intimate Partner Violence: Not At Risk (08/16/2023)   Humiliation, Afraid, Rape, and Kick questionnaire    Fear of Current or Ex-Partner: No    Emotionally Abused: No    Physically Abused: No    Sexually Abused: No    Review of Systems: ROS negative except for what is  noted on William assessment and plan.  Vitals:   08/29/23 1007 08/29/23 1012  BP: (!) 141/96 (!) 141/87  Pulse: 67 66  Temp: 98.1 F (36.7 C)   TempSrc: Oral   SpO2: 97%   Weight: 182 lb (82.6 kg)   Height: 5' 11 (1.803 m)     Physical Exam  Physical Exam: Constitutional: well-appearing in no acute distress HENT: normocephalic atraumatic, mucous membranes moist Neck: supple Cardiovascular: regular rate and  rhythm, no m/r/g Pulmonary/Chest: normal work of breathing on room air, lungs clear to auscultation bilaterally Abdominal: soft, non-tender, non-distended Neurological: alert & oriented x 3, 5/5 strength in bilateral upper and lower extremities, normal gait Skin: warm and dry Right leg shows no swelling; dressing was clean and intact. William area was wrapped, and William [name] reports rewrapping it himself yesterday. After unwrapping: No drainage was noted, and superficial ulcers appear clean with no signs of acute infection.   Assessment & Plan:   Cellulitis of right leg [name] presented to clinic today for follow-up, two weeks after hospital admission for right leg cellulitis complicated by MSSA bacteremia. He is currently receiving cefazolin  2 g IV daily via PICC line and reports no complications with William medication. He denies fever, chills, or other constitutional symptoms.  On exam, there is no evidence of swelling, drainage, or bleeding from William cellulitis or ulcers of William right leg.  William PICC line site is intact with no signs of drainage, infection, redness, induration, or swelling.   His right leg was wrapped; he reports rewrapping it himself yesterday. Dressing was removed and William site appears to be improving, no drainage, purulence, erythema, or warmth.  Plan is to continue cefazolin  as prescribed. William last day of treatment is July 24.  Ordered CBC with differential, ESR, CRP, and BMP today. [name] was asked to return next week for repeat labs.  Instructed him to call  his IV infusion company to confirm PICC line removal after completion of antibiotics and to inform us  if they are unable to assist.  Also reminded [name] to ensure follow-up with Dr. Dennise (infectious disease specialist) is in place.   [name] seen with Dr. Lovie Armando Rossetti M.D [facility] Internal Medicine, PGY-1 Phone: [phone] Date 08/29/2023 Time 1:26 PM  ----------------------------------------------------------------------- -

## 2023-08-29 NOTE — Patient Instructions (Addendum)
 Mr. William [name],  Thank you for coming in today and for allowing us  to be a part of your care.  Today, we reviewed your recent hospital stay and followed up on your leg infection and treatment. We unwrapped and examined your leg, it looks much better. There is no swelling, no drainage, and no signs of infection or reaction to your medication.  Your PICC line looks good and you're tolerating the IV antibiotics well. You only have two days of IV antibiotics left, so please remember to contact your home infusion provider to ask about scheduling the removal of the PICC line once the course is completed.  We also completed your lab work as planned today.  If you have any questions or concerns, please don't hesitate to ask, we're here to help.  Thank you again.  I have ordered the following labs for you:   Lab Orders         CBC with Diff         CRP (C-Reactive Protein)         Sed Rate (ESR)         Basic metabolic panel with GFR        Referrals ordered today:   Referral Orders  No referral(s) requested today     I have ordered the following medication/changed the following medications:   Stop the following medications: There are no discontinued medications.   Start the following medications: No orders of the defined types were placed in this encounter.    Follow up: 1 week for lab work   Remember:  - Please call your IV infusion company today. Your antibiotics will finish on July 24 (Thursday). Please ask your IV infusion company if they will be coming out to remove your IV line (the PICC). If they are not going to remove this, please call our clinic  - Come back to have your labs checked again next week on July 29  Should you have any questions or concerns please call the internal medicine clinic at [phone].    Armando Rossetti, M.D [facility]

## 2023-08-29 NOTE — Assessment & Plan Note (Addendum)
 Patient presented to clinic today for follow-up, two weeks after hospital admission for right leg cellulitis complicated by MSSA bacteremia. He is currently receiving cefazolin  2 g IV daily via PICC line and reports no complications with the medication. He denies fever, chills, or other constitutional symptoms.  On exam, there is no evidence of swelling, drainage, or bleeding from the cellulitis or ulcers of the right leg.  The PICC line site is intact with no signs of drainage, infection, redness, induration, or swelling.   His right leg was wrapped; he reports rewrapping it himself yesterday. Dressing was removed and the site appears to be improving, no drainage, purulence, erythema, or warmth.  Plan is to continue cefazolin  as prescribed. The last day of treatment is July 24.  Ordered CBC with differential, ESR, CRP, and BMP today. Patient was asked to return next week for repeat labs.  Instructed him to call his IV infusion company to confirm PICC line removal after completion of antibiotics and to inform us  if they are unable to assist.  Also reminded patient to ensure follow-up with Dr. Dennise (infectious disease specialist) is in place.

## 2023-08-30 ENCOUNTER — Telehealth: Payer: Self-pay

## 2023-08-30 LAB — CBC WITH DIFFERENTIAL/PLATELET
Basophils Absolute: 0.1 x10E3/uL (ref 0.0–0.2)
Basos: 1 %
EOS (ABSOLUTE): 0.3 x10E3/uL (ref 0.0–0.4)
Eos: 4 %
Hematocrit: 42.6 % (ref 37.5–51.0)
Hemoglobin: 13.2 g/dL (ref 13.0–17.7)
Immature Grans (Abs): 0 x10E3/uL (ref 0.0–0.1)
Immature Granulocytes: 0 %
Lymphocytes Absolute: 2.1 x10E3/uL (ref 0.7–3.1)
Lymphs: 30 %
MCH: 27.6 pg (ref 26.6–33.0)
MCHC: 31 g/dL — ABNORMAL LOW (ref 31.5–35.7)
MCV: 89 fL (ref 79–97)
Monocytes Absolute: 0.8 x10E3/uL (ref 0.1–0.9)
Monocytes: 11 %
Neutrophils Absolute: 3.8 x10E3/uL (ref 1.4–7.0)
Neutrophils: 54 %
Platelets: 251 x10E3/uL (ref 150–450)
RBC: 4.78 x10E6/uL (ref 4.14–5.80)
RDW: 13.3 % (ref 11.6–15.4)
WBC: 7.1 x10E3/uL (ref 3.4–10.8)

## 2023-08-30 LAB — BASIC METABOLIC PANEL WITH GFR
BUN/Creatinine Ratio: 16 (ref 10–24)
BUN: 14 mg/dL (ref 8–27)
CO2: 20 mmol/L (ref 20–29)
Calcium: 9.2 mg/dL (ref 8.6–10.2)
Chloride: 102 mmol/L (ref 96–106)
Creatinine, Ser: 0.88 mg/dL (ref 0.76–1.27)
Glucose: 92 mg/dL (ref 70–99)
Potassium: 4.1 mmol/L (ref 3.5–5.2)
Sodium: 140 mmol/L (ref 134–144)
eGFR: 98 mL/min/1.73 (ref >59)

## 2023-08-30 LAB — SEDIMENTATION RATE: Sed Rate: 35 mm/h — ABNORMAL HIGH (ref 0–30)

## 2023-08-30 LAB — C-REACTIVE PROTEIN: CRP: 4 mg/L (ref 0–10)

## 2023-08-30 NOTE — Progress Notes (Signed)
 Internal Medicine Clinic Attending  I was physically present during the key portions of the resident provided service and participated in the medical decision making of patient's management care. I reviewed pertinent patient test results.  The assessment, diagnosis, and plan were formulated together and I agree with the documentation in the resident's note.  Lovie Clarity, MD   [phone] man with HIV (VL 60, CD4 288) who presents for hospital follow up after recent admission for MSSA bacteremia from RLE wounds & cellulitis.   He has been getting Cefazolin  2g IV q8 hours at home, tolerating this well. His vitals are stable. Wound are improving (see pictures).    We will check BMP, CBC, ESR, CRP today - if inflammatory markers are elevated, I recommend that we reach out to Infectious Disease to make sure antibiotics can still end on 7/24 as planned.   He has ID follow up next month.

## 2023-08-30 NOTE — Telephone Encounter (Signed)
 Lotus Gover, Rn with inhabit [facility] called regarding orders for labs. RN states that labs were drawn from picc line on 7/22. Would like to clarify if she needs to recollect today or if okay to disregard orders. Advised RN as long as labs were done yesterday as ordered on OPAT can disregard for today. Verbalized understanding P: [phone] Lorenda CHRISTELLA Code, RMA

## 2023-08-31 ENCOUNTER — Emergency Department (HOSPITAL_COMMUNITY)
Admission: EM | Admit: 2023-08-31 | Discharge: 2023-08-31 | Disposition: A | Discharge status: Home / Self Care | Attending: Emergency Medicine | Admitting: Emergency Medicine

## 2023-08-31 ENCOUNTER — Other Ambulatory Visit: Payer: Self-pay

## 2023-08-31 ENCOUNTER — Ambulatory Visit: Payer: Self-pay

## 2023-08-31 ENCOUNTER — Ambulatory Visit (INDEPENDENT_AMBULATORY_CARE_PROVIDER_SITE_OTHER)
Admission: EM | Admit: 2023-08-31 | Discharge: 2023-08-31 | Disposition: A | Discharge status: Other Acute Inpatient Hospital

## 2023-08-31 ENCOUNTER — Ambulatory Visit (INDEPENDENT_AMBULATORY_CARE_PROVIDER_SITE_OTHER)
Admission: EM | Admit: 2023-08-31 | Discharge: 2023-09-01 | Disposition: A | Discharge status: Other Acute Inpatient Hospital | Source: Intra-hospital

## 2023-08-31 ENCOUNTER — Encounter (HOSPITAL_COMMUNITY): Payer: Self-pay

## 2023-08-31 DIAGNOSIS — F1414 Cocaine abuse with cocaine-induced mood disorder: Secondary | ICD-10-CM | POA: Diagnosis not present

## 2023-08-31 DIAGNOSIS — L03115 Cellulitis of right lower limb: Secondary | ICD-10-CM | POA: Diagnosis not present

## 2023-08-31 DIAGNOSIS — F1010 Alcohol abuse, uncomplicated: Secondary | ICD-10-CM | POA: Diagnosis not present

## 2023-08-31 DIAGNOSIS — Z8619 Personal history of other infectious and parasitic diseases: Secondary | ICD-10-CM | POA: Diagnosis not present

## 2023-08-31 DIAGNOSIS — Z0279 Encounter for issue of other medical certificate: Secondary | ICD-10-CM | POA: Diagnosis not present

## 2023-08-31 DIAGNOSIS — L03116 Cellulitis of left lower limb: Secondary | ICD-10-CM | POA: Diagnosis not present

## 2023-08-31 DIAGNOSIS — L0390 Cellulitis, unspecified: Secondary | ICD-10-CM | POA: Diagnosis not present

## 2023-08-31 DIAGNOSIS — E785 Hyperlipidemia, unspecified: Secondary | ICD-10-CM | POA: Insufficient documentation

## 2023-08-31 DIAGNOSIS — I10 Essential (primary) hypertension: Secondary | ICD-10-CM | POA: Insufficient documentation

## 2023-08-31 DIAGNOSIS — F1410 Cocaine abuse, uncomplicated: Secondary | ICD-10-CM | POA: Insufficient documentation

## 2023-08-31 DIAGNOSIS — R45851 Suicidal ideations: Secondary | ICD-10-CM | POA: Diagnosis present

## 2023-08-31 DIAGNOSIS — F1910 Other psychoactive substance abuse, uncomplicated: Secondary | ICD-10-CM | POA: Insufficient documentation

## 2023-08-31 DIAGNOSIS — Z87891 Personal history of nicotine dependence: Secondary | ICD-10-CM | POA: Diagnosis not present

## 2023-08-31 DIAGNOSIS — F1020 Alcohol dependence, uncomplicated: Secondary | ICD-10-CM | POA: Insufficient documentation

## 2023-08-31 DIAGNOSIS — Z21 Asymptomatic human immunodeficiency virus [HIV] infection status: Secondary | ICD-10-CM | POA: Insufficient documentation

## 2023-08-31 DIAGNOSIS — K219 Gastro-esophageal reflux disease without esophagitis: Secondary | ICD-10-CM | POA: Insufficient documentation

## 2023-08-31 DIAGNOSIS — F32A Depression, unspecified: Secondary | ICD-10-CM | POA: Insufficient documentation

## 2023-08-31 DIAGNOSIS — Z79899 Other long term (current) drug therapy: Secondary | ICD-10-CM | POA: Diagnosis not present

## 2023-08-31 DIAGNOSIS — Z634 Disappearance and death of family member: Secondary | ICD-10-CM | POA: Diagnosis not present

## 2023-08-31 DIAGNOSIS — F10230 Alcohol dependence with withdrawal, uncomplicated: Secondary | ICD-10-CM | POA: Diagnosis not present

## 2023-08-31 DIAGNOSIS — F1024 Alcohol dependence with alcohol-induced mood disorder: Secondary | ICD-10-CM | POA: Diagnosis not present

## 2023-08-31 LAB — URINALYSIS, W/ REFLEX TO CULTURE (INFECTION SUSPECTED)
Bacteria, UA: NONE SEEN
Bilirubin Urine: NEGATIVE
Glucose, UA: NEGATIVE mg/dL
Hgb urine dipstick: NEGATIVE
Ketones, ur: NEGATIVE mg/dL
Leukocytes,Ua: NEGATIVE
Nitrite: NEGATIVE
Protein, ur: NEGATIVE mg/dL
Specific Gravity, Urine: 1.024 (ref 1.005–1.030)
pH: 5 (ref 5.0–8.0)

## 2023-08-31 LAB — CBC WITH DIFFERENTIAL/PLATELET
Abs Immature Granulocytes: 0.01 K/uL (ref 0.00–0.07)
Basophils Absolute: 0.1 K/uL (ref 0.0–0.1)
Basophils Relative: 1 %
Eosinophils Absolute: 0.4 K/uL (ref 0.0–0.5)
Eosinophils Relative: 5 %
HCT: 39.7 % (ref 39.0–52.0)
Hemoglobin: 13 g/dL (ref 13.0–17.0)
Immature Granulocytes: 0 %
Lymphocytes Relative: 33 %
Lymphs Abs: 2.6 K/uL (ref 0.7–4.0)
MCH: 28.6 pg (ref 26.0–34.0)
MCHC: 32.7 g/dL (ref 30.0–36.0)
MCV: 87.3 fL (ref 80.0–100.0)
Monocytes Absolute: 0.7 K/uL (ref 0.1–1.0)
Monocytes Relative: 9 %
Neutro Abs: 4 K/uL (ref 1.7–7.7)
Neutrophils Relative %: 52 %
Platelets: 257 K/uL (ref 150–400)
RBC: 4.55 MIL/uL (ref 4.22–5.81)
RDW: 13.9 % (ref 11.5–15.5)
WBC: 7.8 K/uL (ref 4.0–10.5)
nRBC: 0 % (ref 0.0–0.2)

## 2023-08-31 LAB — COMPREHENSIVE METABOLIC PANEL WITH GFR
ALT: 20 U/L (ref 0–44)
AST: 26 U/L (ref 15–41)
Albumin: 4 g/dL (ref 3.5–5.0)
Alkaline Phosphatase: 64 U/L (ref 38–126)
Anion gap: 7 (ref 5–15)
BUN: 13 mg/dL (ref 8–23)
CO2: 25 mmol/L (ref 22–32)
Calcium: 9 mg/dL (ref 8.9–10.3)
Chloride: 104 mmol/L (ref 98–111)
Creatinine, Ser: 0.75 mg/dL (ref 0.61–1.24)
GFR, Estimated: >60 mL/min (ref >60)
Glucose, Bld: 88 mg/dL (ref 70–99)
Potassium: 3.6 mmol/L (ref 3.5–5.1)
Sodium: 136 mmol/L (ref 135–145)
Total Bilirubin: 0.7 mg/dL (ref 0.0–1.2)
Total Protein: 7.9 g/dL (ref 6.5–8.1)

## 2023-08-31 LAB — ETHANOL: Alcohol, Ethyl (B): <15 mg/dL (ref <15)

## 2023-08-31 LAB — RAPID URINE DRUG SCREEN, HOSP PERFORMED
Amphetamines: NOT DETECTED
Barbiturates: NOT DETECTED
Benzodiazepines: NOT DETECTED
Cocaine: POSITIVE — AB
Opiates: NOT DETECTED
Tetrahydrocannabinol: NOT DETECTED

## 2023-08-31 LAB — ACETAMINOPHEN LEVEL: Acetaminophen (Tylenol), Serum: <10 ug/mL — ABNORMAL LOW (ref 10–30)

## 2023-08-31 LAB — SALICYLATE LEVEL: Salicylate Lvl: <7 mg/dL — ABNORMAL LOW (ref 7.0–30.0)

## 2023-08-31 NOTE — Discharge Instructions (Signed)
 Pt transferred to [facility] ED for medical clearance.

## 2023-08-31 NOTE — Progress Notes (Signed)
 Pt has been accepted to [facility] on 08/31/2023 . Bed assignment:6   Pt meets inpatient criteria per Alan Mcardle, NP  Attending Physician will be Dr Larina   Report can be called to: [phone]   Pt can arrive pending consents   Care Team Notified: [facility] Luke Sprang, RN, Krista Gails, RN

## 2023-08-31 NOTE — Progress Notes (Signed)
   08/31/23 1552  [facility] Triage Screening (Walk-ins at [facility] only)  What Is the Reason for Your Visit/Call Today? Patient is a 63 year old male who [facility] accompanied byGPD due to wanting to detox from alcohol and cocaine use. Patient reports mental health diagnosis of schizophrenia he does not have a therapist or a psychiatrist at this time patient reports occasional suicidal ideation but currently no plan or intent. Patient denies HI/AVH or paranoia. Patient endorses alcohol use daily. Reports he has been drinking alcohol since the sixth grade. Patient also endorses cocaine. His last use was last night.  How Long Has This Been Causing You Problems? > than 6 months  Have You Recently Had Any Thoughts About Hurting Yourself? No  Are You Planning to Commit Suicide/Harm Yourself At This time? No  Have you Recently Had Thoughts About Hurting Someone [name]? No  Physical Abuse Denies  Verbal Abuse Denies  Sexual Abuse Denies  Exploitation of patient/patient's resources Denies  Self-Neglect Denies  Possible abuse reported to: Other (Comment)  Are you currently experiencing any auditory, visual or other hallucinations? No  Have You Used Any Alcohol or Drugs in the Past 24 Hours? Yes  What Did You Use and How Much? cocaine/amount unknown  Do you have any current medical co-morbidities that require immediate attention? Yes  Please describe current medical co-morbidities that require immediate attention: HIV, Cellulitis  Clinician description of patient physical appearance/behavior: causually dressed, calm, cooperative  What Do You Feel Would Help You the Most Today? Alcohol or Drug Use Treatment;Treatment for Depression or other mood problem;Medication(s)  If access to [facility] was not available, would you have sought care in the Emergency Department? Yes  Determination of Need Urgent (48 hours)  Options For Referral Chemical Dependency Intensive Outpatient Therapy (CDIOP);Medication  Management;Inpatient Hospitalization;Facility-Based Crisis  Determination of Need filed? Yes

## 2023-08-31 NOTE — ED Provider Notes (Signed)
 Behavioral Health Urgent Care Medical Screening Exam  Patient Name: William [name] MRN: [id number] Date of Evaluation: 08/31/23 Chief Complaint:  I have thoughts about killing myself Diagnosis:  Final diagnoses:  Suicidal ideation  Alcohol abuse    History of Present illness: William [name] 63 y.o., male patient presented to [facility] as a voluntary walk in accompanied by GPD with complaints of passive suicidal ideations and wanting to detox from alcohol. William [name], is seen face to face by this provider, consulted with William [name]; and chart reviewed on 08/31/23.  Per chart review, patient has a past psychiatric history of alcohol use disorder.  He is not currently receiving treatment for mental health or substance abuse.  Medical history significant for HIV, hypertension, history of hepatitis C, history of pneumothorax, GERD, hyperlipidemia and was recently hospitalized for sepsis related to cellulitis of the lower extremities.  He currently has an IV line to his right arm for at home IV antibiotics.  He is currently receiving at home IV Ancef  every 8 hours for 9 days since 08/22/23.  Patient reports that he did have a home aide nurse that was helping with dressing changes and making sure the IV site was clean but that his medicine was administered mostly by his brother with whom he lives with.  On evaluation William [name] reports that he has been having suicidal ideations without plan or intent and states that he is just having these thoughts from time to time .  He reports that he has been using alcohol to take over the emotions that he has.  He reports that he recently lost his mother and that he is currently living with his brother.  He reports that another stressor being his medical diagnoses.  Patient denies history of suicide attempts.  Patient states that he is around drugs and alcohol a lot and majority of the people he knows have access to it which is made it hard to stop  drinking alcohol.  Patient reports that he has been drinking alcohol since he was 35 or 63 years old.  He is currently drinking anywhere from 2 to 4 40 ounce beers every night after work, everyday.  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  He denies having any history of withdrawal seizures or withdrawal symptoms when not drinking.  He reports his last alcoholic drink was today before presenting to [facility], he drank a 40 ounce beer.  He also reports occasional crack cocaine use last use was 3 days ago.  Patient reports that he is looking to be in the hospital to help treat his depression and detox from alcohol.   Upon examination it is noted the patient has an IV still present in his right arm.  Patient was being treated at home with IV Ancef  for cellulitis of the lower extremities leading to sepsis.  Patient reports that today was supposed to be the last day of receiving the IV antibiotics.  Patient also has multiple wounds to bilateral lower extremities.  There are multiple bumps to his arms and shoulders, which he is unable to explain.  Patient is informed that he will be transferred to [facility] emergency department for medical clearance.  During evaluation William [name] is sitting in assessment room, in no acute distress.  He is alert & oriented x 4, calm, cooperative and attentive for this assessment.  His mood is depressed with congruent affect.  He has normal speech, and behavior.  Objectively there is no evidence of psychosis/mania or delusional thinking. Pt does not appear to be responding to internal or external stimuli.  Patient is able to converse coherently, goal directed thoughts, no distractibility, or pre-occupation.  Patient endorses suicidal ideations today without plan or intent.  He currently denies homicidal ideation, psychosis, and paranoia.  Patient answered assessment question appropriately.      Flowsheet Row ED from 08/23/2023 in [facility] Emergency  Department at [facility] ED to Hosp-Admission (Discharged) from 08/16/2023 in [facility] GENERAL MED/SURG UNIT ED to Hosp-Admission (Discharged) from 02/05/2023 in [facility] GENERAL MED/SURG UNIT  C-SSRS RISK CATEGORY No Risk No Risk No Risk    Psychiatric Specialty Exam  Presentation  General Appearance:Casual  Eye Contact:Fair  Speech:Clear and Coherent  Speech Volume:Normal  Handedness:No data recorded  Mood and Affect  Mood: Depressed; Dysphoric; Hopeless  Affect: Congruent; Depressed   Thought Process  Thought Processes: Coherent  Descriptions of Associations:Intact  Orientation:Full (Time, Place and Person)  Thought Content:WDL    Hallucinations:None  Ideas of Reference:None  Suicidal Thoughts:Yes, Passive Without Intent; Without Plan  Homicidal Thoughts:No   Sensorium  Memory: Recent Fair; Immediate Good  Judgment: Fair  Insight: Fair   Chartered certified accountant: Fair  Attention Span: Fair  Recall: [employer] of Knowledge: Fair  Language: Fair   Psychomotor Activity  Psychomotor Activity: Normal   Assets  Assets: Manufacturing systems engineer; Desire for Improvement; Financial Resources/Insurance; Housing; Resilience; Social Support   Sleep  Sleep: Fair  Number of hours:  6   Physical Exam: Physical Exam Vitals and nursing note reviewed.  Constitutional:      Appearance: Normal appearance.  HENT:     Head: Normocephalic.     Nose: Nose normal.  Eyes:     Extraocular Movements: Extraocular movements intact.  Cardiovascular:     Rate and Rhythm: Normal rate.  Pulmonary:     Effort: Pulmonary effort is normal.  Musculoskeletal:        General: Normal range of motion.     Cervical back: Normal range of motion.  Skin:    Comments: Pt has multiple wounds to bilateral lower legs. Multiple bumps on both arms. IV line still in place to right arm.   Neurological:     General: No focal  deficit present.     Mental Status: He is alert and oriented to person, place, and time.    Review of Systems  Constitutional: Negative.   HENT: Negative.    Eyes: Negative.   Respiratory: Negative.    Cardiovascular: Negative.   Gastrointestinal: Negative.   Genitourinary: Negative.   Musculoskeletal: Negative.   Skin:        Currently being treated at home with IV abx for sepsis/cellulitis to bilateral lower legs.  Neurological: Negative.   Endo/Heme/Allergies: Negative.   Psychiatric/Behavioral:  Positive for depression, substance abuse and suicidal ideas.    Blood pressure 114/76, pulse 81, temperature 98.4 F (36.9 C), temperature source Oral, resp. rate 18, SpO2 96%. There is no height or weight on file to calculate BMI.  Musculoskeletal: Strength & Muscle Tone: within normal limits Gait & Station: normal Patient leans: N/A   [facility] MSE Discharge Disposition for Follow up and Recommendations: Based on my evaluation the patient appears to have an emergency medical condition for which I recommend the patient be transferred to the emergency department for further evaluation.  Patient transferred to [facility] emergency department for medical clearance.  Accepting EDP  is William [name].   Recommendations: - Patient to receive inpatient hospitalization on geropsych unit.  Per [facility], there are no geropsych bed at this time.  If patient's medical status is stable and clear, any possible for patient to receive treatment at the [facility] if no exclusionary criteria.  - Patient has history of alcohol use disorder and reports drinking about 2 to 4 40 ounce beers a day.  Initiate [facility] detox protocol. - If medically cleared to return to [facility], please provide wound and medical recommendations in note.   William JAYSON Mcardle, NP 08/31/2023, 5:40 PM

## 2023-08-31 NOTE — ED Provider Notes (Signed)
 Emergency Department Provider Note   I have reviewed the triage vital signs and the nursing notes.   HISTORY  Chief Complaint Central Line removal , Medical Clearance, Suicidal, and Homicidal   HPI William [name] is a 63 y.o. male presents to the emergency department from behavioral health urgent care accompanied by GPD with suicidal ideation.  He was recently discharged from the internal medicine teaching service on 7/15 with bacteremia.  He finished his cefazolin  at home this morning through his PICC line in the right arm.  He denies missing any doses of medication.  He reported intrusive suicidal thoughts worsening today which prompted his visit to urgent care.  His last drink was today. Denies fever. No new symptoms.  They evaluated the patient and are recommending geropsych placement.  He is not currently under IVC.   Past Medical History:  Diagnosis Date   Allergy    Chronic hepatitis C without hepatic coma (HCC) 07/10/2006   His hepatitis C was treated and cured.      Folliculitis 06/02/2015   Grief 03/27/2019   HIV (human immunodeficiency virus infection) (HCC)    Hypertension    PNEUMOCYSTIS PNEUMONIA 07/10/2006   Qualifier: Diagnosis of  By: Janna MD, Burnard     Pneumothorax    Skin ulcers of foot, bilateral (HCC), R> L, painful 02/05/2023   Spontaneous pneumothorax 04/24/2013    Review of Systems  Constitutional: No fever/chills Cardiovascular: Denies chest pain. Respiratory: Denies shortness of breath. Gastrointestinal: No abdominal pain.  Musculoskeletal: Negative for back pain. Skin: Negative for rash. Neurological: Negative for headaches.  ____________________________________________   PHYSICAL EXAM:  VITAL SIGNS: ED Triage Vitals  Encounter Vitals Group     BP 08/31/23 1830 (!) 134/97     Pulse Rate 08/31/23 1830 77     Resp 08/31/23 1830 18     Temp 08/31/23 1830 98.1 F (36.7 C)     Temp Source 08/31/23 1830 Oral     SpO2 08/31/23 1830  96 %     Weight 08/31/23 1832 185 lb (83.9 kg)     Height 08/31/23 1832 5' 11 (1.803 m)   Constitutional: Alert and oriented. Well appearing and in no acute distress. Eyes: Conjunctivae are normal. Head: Atraumatic. Nose: No congestion/rhinnorhea. Mouth/Throat: Mucous membranes are moist.   Neck: No stridor.   Cardiovascular: Normal rate, regular rhythm. Good peripheral circulation. Grossly normal heart sounds. PICC line in the right arm.  Respiratory: Normal respiratory effort.  No retractions. Lungs CTAB. Gastrointestinal: Soft and nontender. No distention.  Musculoskeletal: No lower extremity tenderness nor edema. No gross deformities of extremities. Neurologic:  Normal speech and language. No gross focal neurologic deficits are appreciated.  Skin:  Skin is warm, dry and intact. No rash noted.   ____________________________________________   LABS (all labs ordered are listed, but only abnormal results are displayed)  Labs Reviewed  ACETAMINOPHEN  LEVEL - Abnormal; Notable for the following components:      Result Value   Acetaminophen  (Tylenol ), Serum <10 (*)    All other components within normal limits  SALICYLATE LEVEL - Abnormal; Notable for the following components:   Salicylate Lvl <7.0 (*)    All other components within normal limits  RAPID URINE DRUG SCREEN, HOSP PERFORMED - Abnormal; Notable for the following components:   Cocaine POSITIVE (*)    All other components within normal limits  COMPREHENSIVE METABOLIC PANEL WITH GFR  CBC WITH DIFFERENTIAL/PLATELET  ETHANOL  URINALYSIS, W/ REFLEX TO CULTURE (INFECTION SUSPECTED)  ____________________________________________   PROCEDURES  Procedure(s) performed:   Procedures  None  ____________________________________________   INITIAL IMPRESSION / ASSESSMENT AND PLAN / ED COURSE  Pertinent labs & imaging results that were available during my care of the patient were reviewed by me and considered in my  medical decision making (see chart for details).   This patient is Presenting for Evaluation of SI, which does require a range of treatment options, and is a complaint that involves a high risk of morbidity and mortality.  The Differential Diagnoses includes SI, depression, anxiety, substance induced mood disorder, etc.   Clinical Laboratory Tests Ordered, included CBC without leukocytosis.  No anemia.  No acute kidney injury.  UA normal. Cocaine positive on UDS.   Medical Decision Making: Summary:  The patient presents the emergency department for medical clearance.  Labs here are reassuring.  He has no infection signs or symptoms.  In review of his discharge summary he has completed his antibiotic course as an outpatient.  Plan to remove the PICC line which was scheduled for next week however patient is entering geropsych placement in terms of recommendation and cannot go with this line in place.   Patient accepted back to [facility]  Patient's presentation is most consistent with acute presentation with potential threat to life or bodily function.   Disposition: discharge   ____________________________________________  FINAL CLINICAL IMPRESSION(S) / ED DIAGNOSES  Final diagnoses:  Suicidal ideation    Note:  This document was prepared using Dragon voice recognition software and may include unintentional dictation errors.  Fonda Law, MD, [facility] Emergency [facility]    Aydee Mcnew, Fonda MATSU, MD 08/31/23 [id number]

## 2023-08-31 NOTE — ED Notes (Signed)
Patient transferred to [facility] for medical clearance.

## 2023-08-31 NOTE — ED Triage Notes (Signed)
 Pt went to [facility] for SI and HI thoughts. Pt also states he can hear voices. [facility] states pt needs PICC removed. Axox4.

## 2023-08-31 NOTE — Progress Notes (Signed)
 Patient's lab results show ESR of 35, CRP of 4, and white blood cell count of 7. I called the patient's brother, who reports that the patient is doing well with no fever, swelling, or discharge. After consultation with Dr. Francesco, we decided there is no need to extend antibiotic therapy at this time. The patient and family were informed about red flag symptoms and advised to follow up with Infectious Disease for ongoing care.

## 2023-08-31 NOTE — ED Notes (Signed)
 Safety transport here to transport patient to [facility]

## 2023-09-01 ENCOUNTER — Inpatient Hospital Stay (HOSPITAL_COMMUNITY): Admit: 2023-09-01 | Source: Intra-hospital

## 2023-09-01 ENCOUNTER — Telehealth: Payer: Self-pay

## 2023-09-01 ENCOUNTER — Other Ambulatory Visit (INDEPENDENT_AMBULATORY_CARE_PROVIDER_SITE_OTHER)
Admission: EM | Admit: 2023-09-01 | Discharge: 2023-09-06 | Disposition: A | Discharge status: Home / Self Care | Source: Intra-hospital | Attending: Psychiatry | Admitting: Psychiatry

## 2023-09-01 DIAGNOSIS — F1910 Other psychoactive substance abuse, uncomplicated: Secondary | ICD-10-CM

## 2023-09-01 DIAGNOSIS — F10230 Alcohol dependence with withdrawal, uncomplicated: Secondary | ICD-10-CM | POA: Insufficient documentation

## 2023-09-01 DIAGNOSIS — Z79899 Other long term (current) drug therapy: Secondary | ICD-10-CM | POA: Insufficient documentation

## 2023-09-01 DIAGNOSIS — L03115 Cellulitis of right lower limb: Secondary | ICD-10-CM | POA: Diagnosis present

## 2023-09-01 DIAGNOSIS — R768 Other specified abnormal immunological findings in serum: Secondary | ICD-10-CM | POA: Diagnosis present

## 2023-09-01 DIAGNOSIS — Z0279 Encounter for issue of other medical certificate: Secondary | ICD-10-CM | POA: Diagnosis not present

## 2023-09-01 DIAGNOSIS — F1094 Alcohol use, unspecified with alcohol-induced mood disorder: Secondary | ICD-10-CM

## 2023-09-01 DIAGNOSIS — L97519 Non-pressure chronic ulcer of other part of right foot with unspecified severity: Secondary | ICD-10-CM | POA: Diagnosis present

## 2023-09-01 DIAGNOSIS — R45851 Suicidal ideations: Secondary | ICD-10-CM | POA: Insufficient documentation

## 2023-09-01 DIAGNOSIS — F1020 Alcohol dependence, uncomplicated: Secondary | ICD-10-CM | POA: Diagnosis present

## 2023-09-01 DIAGNOSIS — I10 Essential (primary) hypertension: Secondary | ICD-10-CM | POA: Insufficient documentation

## 2023-09-01 DIAGNOSIS — Z87891 Personal history of nicotine dependence: Secondary | ICD-10-CM | POA: Insufficient documentation

## 2023-09-01 DIAGNOSIS — F1414 Cocaine abuse with cocaine-induced mood disorder: Secondary | ICD-10-CM | POA: Insufficient documentation

## 2023-09-01 DIAGNOSIS — B20 Human immunodeficiency virus [HIV] disease: Secondary | ICD-10-CM | POA: Diagnosis present

## 2023-09-01 DIAGNOSIS — F1420 Cocaine dependence, uncomplicated: Secondary | ICD-10-CM | POA: Diagnosis present

## 2023-09-01 DIAGNOSIS — E785 Hyperlipidemia, unspecified: Secondary | ICD-10-CM | POA: Insufficient documentation

## 2023-09-01 DIAGNOSIS — Z8619 Personal history of other infectious and parasitic diseases: Secondary | ICD-10-CM | POA: Insufficient documentation

## 2023-09-01 DIAGNOSIS — L309 Dermatitis, unspecified: Secondary | ICD-10-CM | POA: Diagnosis present

## 2023-09-01 DIAGNOSIS — Z21 Asymptomatic human immunodeficiency virus [HIV] infection status: Secondary | ICD-10-CM | POA: Insufficient documentation

## 2023-09-01 DIAGNOSIS — F1024 Alcohol dependence with alcohol-induced mood disorder: Secondary | ICD-10-CM | POA: Insufficient documentation

## 2023-09-01 DIAGNOSIS — L03116 Cellulitis of left lower limb: Secondary | ICD-10-CM | POA: Insufficient documentation

## 2023-09-01 DIAGNOSIS — Z634 Disappearance and death of family member: Secondary | ICD-10-CM | POA: Insufficient documentation

## 2023-09-01 MED ORDER — LOPERAMIDE HCL 2 MG PO CAPS: 2.0000 mg | ORAL_CAPSULE | ORAL | Status: DC | PRN

## 2023-09-01 MED ORDER — HALOPERIDOL LACTATE 5 MG/ML IJ SOLN: 5.0000 mg | Freq: Three times a day (TID) | INTRAMUSCULAR | Status: DC | PRN

## 2023-09-01 MED ORDER — ONDANSETRON 4 MG PO TBDP: 4.0000 mg | ORAL_TABLET | Freq: Four times a day (QID) | ORAL | Status: DC | PRN

## 2023-09-01 MED ORDER — HYDROCHLOROTHIAZIDE 25 MG PO TABS
25.0000 mg | ORAL_TABLET | Freq: Every day | ORAL | Status: DC
  Administered 2023-09-01: 25 mg via ORAL
  Filled 2023-09-01: qty 1

## 2023-09-01 MED ORDER — HYDROCHLOROTHIAZIDE 25 MG PO TABS: 25.0000 mg | ORAL_TABLET | Freq: Every day | ORAL | Status: DC

## 2023-09-01 MED ORDER — DIPHENHYDRAMINE HCL 50 MG/ML IJ SOLN: 50.0000 mg | Freq: Three times a day (TID) | INTRAMUSCULAR | Status: DC | PRN

## 2023-09-01 MED ORDER — HYDROCHLOROTHIAZIDE 25 MG PO TABS
25.0000 mg | ORAL_TABLET | Freq: Every day | ORAL | Status: DC
  Administered 2023-09-02 – 2023-09-06 (×5): 25 mg via ORAL
  Filled 2023-09-01 (×5): qty 1

## 2023-09-01 MED ORDER — TRAZODONE HCL 50 MG PO TABS: 50.0000 mg | ORAL_TABLET | Freq: Every evening | ORAL | Status: DC | PRN

## 2023-09-01 MED ORDER — ONDANSETRON 4 MG PO TBDP: 4.0000 mg | ORAL_TABLET | Freq: Four times a day (QID) | ORAL | Status: AC | PRN

## 2023-09-01 MED ORDER — ACETAMINOPHEN 325 MG PO TABS: 650.0000 mg | ORAL_TABLET | Freq: Four times a day (QID) | ORAL | Status: DC | PRN

## 2023-09-01 MED ORDER — LOPERAMIDE HCL 2 MG PO CAPS: 2.0000 mg | ORAL_CAPSULE | ORAL | Status: AC | PRN

## 2023-09-01 MED ORDER — LORAZEPAM 2 MG/ML IJ SOLN: 2.0000 mg | Freq: Three times a day (TID) | INTRAMUSCULAR | Status: DC | PRN

## 2023-09-01 MED ORDER — HYDROXYZINE HCL 25 MG PO TABS
25.0000 mg | ORAL_TABLET | Freq: Four times a day (QID) | ORAL | Status: DC | PRN
  Administered 2023-09-01: 25 mg via ORAL
  Filled 2023-09-01: qty 1

## 2023-09-01 MED ORDER — THIAMINE HCL 100 MG/ML IJ SOLN
100.0000 mg | Freq: Once | INTRAMUSCULAR | Status: AC
  Administered 2023-09-01: 100 mg via INTRAMUSCULAR
  Filled 2023-09-01: qty 2

## 2023-09-01 MED ORDER — HYDROXYZINE HCL 25 MG PO TABS: 25.0000 mg | ORAL_TABLET | Freq: Four times a day (QID) | ORAL | Status: AC | PRN

## 2023-09-01 MED ORDER — METHOCARBAMOL 500 MG PO TABS: 500.0000 mg | ORAL_TABLET | Freq: Three times a day (TID) | ORAL | Status: AC | PRN

## 2023-09-01 MED ORDER — DICYCLOMINE HCL 20 MG PO TABS: 20.0000 mg | ORAL_TABLET | Freq: Four times a day (QID) | ORAL | Status: AC | PRN

## 2023-09-01 MED ORDER — OLANZAPINE 5 MG PO TBDP: 5.0000 mg | ORAL_TABLET | Freq: Three times a day (TID) | ORAL | Status: DC | PRN

## 2023-09-01 MED ORDER — TRAZODONE HCL 50 MG PO TABS
50.0000 mg | ORAL_TABLET | Freq: Every evening | ORAL | Status: DC | PRN
  Administered 2023-09-04: 50 mg via ORAL
  Filled 2023-09-01: qty 1

## 2023-09-01 MED ORDER — CHLORDIAZEPOXIDE HCL 25 MG PO CAPS: 25.0000 mg | ORAL_CAPSULE | Freq: Four times a day (QID) | ORAL | Status: DC | PRN

## 2023-09-01 MED ORDER — METHOCARBAMOL 500 MG PO TABS: 500.0000 mg | ORAL_TABLET | Freq: Three times a day (TID) | ORAL | Status: DC | PRN

## 2023-09-01 MED ORDER — ALUM & MAG HYDROXIDE-SIMETH 200-200-20 MG/5ML PO SUSP: 30.0000 mL | ORAL | Status: DC | PRN

## 2023-09-01 MED ORDER — CHLORDIAZEPOXIDE HCL 25 MG PO CAPS: 25.0000 mg | ORAL_CAPSULE | Freq: Four times a day (QID) | ORAL | Status: AC | PRN

## 2023-09-01 MED ORDER — DICYCLOMINE HCL 20 MG PO TABS: 20.0000 mg | ORAL_TABLET | Freq: Four times a day (QID) | ORAL | Status: DC | PRN

## 2023-09-01 MED ORDER — BICTEGRAVIR-EMTRICITAB-TENOFOV 50-200-25 MG PO TABS
1.0000 | ORAL_TABLET | Freq: Every day | ORAL | Status: DC
  Administered 2023-09-02 – 2023-09-06 (×5): 1 via ORAL
  Filled 2023-09-01 (×5): qty 1

## 2023-09-01 MED ORDER — OLANZAPINE 10 MG IM SOLR: 5.0000 mg | Freq: Three times a day (TID) | INTRAMUSCULAR | Status: DC | PRN

## 2023-09-01 MED ORDER — ADULT MULTIVITAMIN W/MINERALS CH: 1.0000 | ORAL_TABLET | Freq: Every day | ORAL | Status: DC

## 2023-09-01 MED ORDER — MAGNESIUM HYDROXIDE 400 MG/5ML PO SUSP: 30.0000 mL | Freq: Every day | ORAL | Status: DC | PRN

## 2023-09-01 MED ORDER — BICTEGRAVIR-EMTRICITAB-TENOFOV 50-200-25 MG PO TABS
1.0000 | ORAL_TABLET | Freq: Every day | ORAL | Status: DC
  Administered 2023-09-01: 1 via ORAL
  Filled 2023-09-01: qty 1

## 2023-09-01 NOTE — ED Provider Notes (Signed)
 [facility] OBS ASAP Discharge Summary  Date and Time: 09/01/2023 10:04 AM Name: William [name] MRN:  [id number]   Subjective  HPI William [name] is a 63 year old Male who arrived at [facility] as a voluntary walk-in on 08/31/2023 for SI and alcohol Detox. Psychiatric history includes alcohol use disorder. Medical history includes HIV, recent cellulitis, HTN, and GERD. No reported past psychiatric hospitalizations  09/01/23 : Pt was sitting upright today without concern. Continues to endorse passive SI and requests Detox, verifying that his last drink was about 1pm on 7/24, which was a 40oz beer w/ reported history of seizures and DTs. R sided facial droop discussed, he says he thinks it started about a month ago and did not require hospitalization. He also now endorses hearing some voices that he has had for several years, but never sought treatment for them, but would now like to get them taken care of. Ethanol: <15.  Final diagnoses:  Alcohol use disorder, severe, dependence (HCC)  Substance abuse (HCC)  Polysubstance abuse (HCC)  Cocaine abuse (HCC)  Suicidal ideation     Stay Summary: Pt arrived on 08/31/2023 for SI and Detox, went to the ED briefly for PICC line removal and returned same day. Was accepted to [facility] pending an available bed. Has been pleasant an cooperative on the unit and was discharged to [facility] on 09/01/2023.   Current Medications:  (Automatically populated. For most accurate medication list, please review the chart) Current Facility-Administered Medications  Medication Dose Route Frequency Provider Last Rate Last Admin   acetaminophen  (TYLENOL ) tablet 650 mg  650 mg Oral Q6H PRN Onuoha, Chinwendu V, NP       bictegravir-emtricitabine -tenofovir  AF (BIKTARVY ) 50-200-25 MG per tablet 1 tablet  1 tablet Oral Daily Onuoha, Chinwendu V, NP   1 tablet at 09/01/23 0855   chlordiazePOXIDE (LIBRIUM) capsule 25 mg  25 mg Oral Q6H PRN Onuoha, Chinwendu V, NP       dicyclomine (BENTYL)  tablet 20 mg  20 mg Oral Q6H PRN Onuoha, Chinwendu V, NP       hydrochlorothiazide  (HYDRODIURIL ) tablet 25 mg  25 mg Oral Daily Onuoha, Chinwendu V, NP   25 mg at 09/01/23 0214   hydrOXYzine  (ATARAX ) tablet 25 mg  25 mg Oral Q6H PRN Onuoha, Chinwendu V, NP   25 mg at 09/01/23 0856   loperamide (IMODIUM) capsule 2-4 mg  2-4 mg Oral PRN Onuoha, Chinwendu V, NP       methocarbamol (ROBAXIN) tablet 500 mg  500 mg Oral Q8H PRN Onuoha, Chinwendu V, NP       OLANZapine (ZYPREXA) injection 5 mg  5 mg Intramuscular TID PRN Onuoha, Chinwendu V, NP       OLANZapine zydis (ZYPREXA) disintegrating tablet 5 mg  5 mg Oral TID PRN Onuoha, Chinwendu V, NP       ondansetron  (ZOFRAN -ODT) disintegrating tablet 4 mg  4 mg Oral Q6H PRN Onuoha, Chinwendu V, NP       traZODone (DESYREL) tablet 50 mg  50 mg Oral QHS PRN Onuoha, Chinwendu V, NP       Current Outpatient Medications  Medication Sig Dispense Refill   bictegravir-emtricitabine -tenofovir  AF (BIKTARVY ) 50-200-25 MG TABS tablet Take 1 tablet by mouth daily. 30 tablet 11   hydrochlorothiazide  (HYDRODIURIL ) 25 MG tablet Take 1 tablet (25 mg total) by mouth daily. 90 tablet 3   rosuvastatin  (CRESTOR ) 10 MG tablet Take 1 tablet (10 mg total) by mouth daily. 90 tablet 4   triamcinolone  ointment (  KENALOG ) 0.5 % Apply topically 3 (three) times daily. 30 g 0    Objective  Labs  Labs have been personally reviewed and show:  Normal / Nonconcerning: CMP, CBC, UA, Salicylate, Acetaminophen  Alarming: None  UDS: + Cocaine, otherwise negative Ethanol: <15  Qtc: 421    Monitoring Reports   PHQ2-9    Flowsheet Row Office Visit from 08/29/2023 in [facility] - A Dept Of [place]. [facility] Office Visit from 11[facility]10/2022 in [facility] - A Dept Of [place]. [facility] Office Visit from 10/26/2022 in [facility] - A Dept Of [place]. [facility] Office Visit from 09/01/2022 in [facility] - A Dept Of [place]. [facility] Office Visit from 05/11/2022 in [facility] Infect Dis - A Dept Of . [facility]  PHQ-2 Total Score 0 0 0 0 0  PHQ-9 Total Score -- -- 0 -- --   Flowsheet Row ED from 08/31/2023 in [facility] Most recent reading at 09/01/2023  1:16 AM ED from 08/31/2023 in [facility] Emergency Department at [facility] Most recent reading at 08/31/2023  6:33 PM ED from 08/23/2023 in [facility] Emergency Department at [facility] Most recent reading at 08/23/2023  2:43 PM  C-SSRS RISK CATEGORY Moderate Risk High Risk No Risk      Physical Exam  Blood pressure (!) 114/94, pulse 69, temperature 98 F (36.7 C), temperature source Oral, resp. rate 20, SpO2 100%. There is no height or weight on file to calculate BMI.  Physical Exam Vitals reviewed.  Constitutional:      General: He is not in acute distress.    Appearance: He is not toxic-appearing.  Pulmonary:     Effort: Pulmonary effort is normal. No respiratory distress.  Neurological:     Mental Status: He is alert and oriented to person, place, and time.     Comments: R sided facial droop, been present for months per patient     Review of Systems  Respiratory: Negative.    Cardiovascular: Negative.   Gastrointestinal: Negative.      Mental Status Exam  Apperance: Appropriate for environment and Sitting upright Behavior: Calm Speech: Normal Rate, Articulate, Normal Volume, and Responsive Attitude: Cooperative and Friendly Mood: I'm have anxiety Affect: Euthymic and Normal Range Perception: Not responding to internal stimuli Thought Content: within normal limits Thought Form: Goal Directed, Organized, Linear, and Logical Cognition: Alert & Oriented to person, place, and time, Recent and Remote memory grossly intact by recounting personal history, and Immediate memory grossly intact by interview Judgment:  POOR Insight: Fair  Reliability: Reason to fake worsened symptoms for secondary gain of ensuring a bed for detox  Key Points: endorses SUICIDAL IDEATIONS passive, without plan or intent.   Assessment & Plan  Suicide Risk Assessment Demographic Factors:  Male and Low socioeconomic status  Loss Factors: NA  Historical Factors: NA  Risk Reduction Factors:   Sense of responsibility to family  Continued Clinical Symptoms:  Alcohol/Substance Abuse/Dependencies  Cognitive Features That Contribute To Risk:  None    Suicide Risk:  Moderate:  Frequent suicidal ideation with limited intensity, and duration, no associated intent, good self-control, limited dysphoria/symptomatology, some risk factors present, and identifiable protective factors.    Clinical Impression Final diagnoses:  Alcohol use disorder, severe, dependence (HCC)  Substance abuse (HCC)  Polysubstance abuse (HCC)  Cocaine abuse (HCC)  Suicidal ideation    Daily contact with patient to assess and evaluate symptoms and progress in treatment  #Alcohol use disorder Severe, dependence # Cocaine Abuse  - CIWA Protocol w/ Librium 25mg  PRN  - COWS Protocol  - Admit to [facility] for higher level of care  - Thiamine 100mg  IM received   #Suicidal ideation, passive  - Admit to [facility] for higher level of care  Continuing home medications  - Hydrochlorothiazide  25mg  QD (HTN)  - Biktarvy  50-200-25mg  every day (HIV)   Other PRNs -Trazodone 50 mg Po daily at bedtime for insomnia -Prn agitation protocol medications -Tylenol  650 mg PO daily as needed for pain - Bentyl 20mg  Q6h as needed for abdominal cramping  Disposition: Admit to [facility] for higher level of care.  Penne Mori, DO PGY-1  Psychiatry  09/01/2023 10:04 AM

## 2023-09-01 NOTE — Group Note (Signed)
 Group Topic: Wellness  Group Date: 09/01/2023 Start Time: 1730 End Time: 1745 Facilitators: Daved Tinnie HERO, RN  Department: [facility]  Number of Participants: 6  Group Focus: nursing group Treatment Modality:  Psychoeducation Interventions utilized were patient education Purpose: medication education  Name: William [name] Date of Birth: December 15, 1960  MR: [id number]    Level of Participation: moderate Quality of Participation: cooperative Interactions with others: gave feedback Mood/Affect: appropriate Triggers (if applicable): n/a Cognition: logical Progress: Gaining insight Response: pt expressed understanding, further questions denied  Plan: patient will be encouraged to discuss medications with provider and RN  Patients Problems:  Patient Active Problem List   Diagnosis Date Noted   Dilatation of aorta ~34mm  (HCC) 08/18/2023   MSSA bacteremia 08/18/2023   Staphylococcus aureus bacteremia 08/17/2023   Cellulitis of right leg 08/16/2023   Healthcare maintenance 07/14/2023   Cellulitis of right lower extremity 02/12/2023   Eczematous dermatitis 02/06/2023   Skin ulcers of foot, bilateral (HCC), R> L, painful 02/05/2023   Hyperlipidemia 09/01/2022   Dysphagia 01/12/2021   Gastritis 01/12/2021   Screening for colon cancer 05/25/2020   Hypertension 03/27/2019   GERD with esophagitis 09/01/2016   Cigarette smoker 12/23/2014   Seasonal allergies 01/22/2013   Human immunodeficiency virus (HIV) disease (HCC) 07/10/2006   Alcohol use disorder, moderate, dependence (HCC) 07/10/2006

## 2023-09-01 NOTE — ED Notes (Signed)
 Pt oriented to unit, questions denied. Pt reports recent hx of bilateral foot infection, both feet currently wrapped. Pt says bandages have not been changed since Monday. Provider notified. Pt denies symptoms r/t foot infections, no pain and says he can ambulate fine. Pt denis current si hi - verbal contract for safety provided. Pt generally cooperative.

## 2023-09-01 NOTE — ED Notes (Signed)
 Pt ate dinner, currently watching TV in dayroom. Pt denies complaints, no distress observed.

## 2023-09-01 NOTE — ED Notes (Signed)
 Patient is sleeping. No issues.

## 2023-09-01 NOTE — ED Notes (Signed)
 Pt A&Ox4, sitting/laying on recliner/bed watching TV in NAD. Calm and cooperative. Will continue to monitor.

## 2023-09-01 NOTE — ED Notes (Signed)
 Pt A&Ox4, calm & cooperative and in NAD at this time. Endorses SI w/ plan, but then unable to tell me what the plan is. Endorses some AH. Denies HI/VH. Contracts for safety. Encouragement and support given. Will continue to monitor.

## 2023-09-01 NOTE — ED Notes (Addendum)
 Patient admitted around 12:30 PM. He has history of illicit substance use, ETOH,and suicidal ideation. Medical history: HIV, hypertension, hepatitis C, Pneumothorax, GERD, and hyperlipidemia. Upper body skin integrity intact. Recently he has been getting lower extremities treatment related to cellulitis. He was on IV ABT for 9 days. The lower extremities skin color is changed because of infection. Patient lost his biological mother recently. Because of grief, he started to drink alcohol excessively. Often he had thinking of ending his life but no intention. He has a job as Teacher, early years/pre.No history of withdrawal or seizure. He is calm and quiet. Denied AVH. He is alert and oriented to time, place, person and situation. Currently denied SI/HI. No sleep disturbances reported. BM remain as usual. Med compliant.

## 2023-09-01 NOTE — ED Notes (Signed)
 Patient is in the bedroom calm and sleeping. NAD. Will continue to monitor for safety.
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
## 2023-09-01 NOTE — ED Provider Notes (Signed)
 [facility] Continuous Assessment Admission H&P  Date: 09/01/23 Patient Name: William [name] MRN: [id number] Chief Complaint:  here for alcohol detox  Diagnoses:  Final diagnoses:  Alcohol use disorder, severe, dependence (HCC)  Substance abuse (HCC)  Polysubstance abuse (HCC)  Cocaine abuse (HCC)    HPI: William [name] is a 63 year old male with psychiatric history of alcohol use disorder, and cigarette use, and medical history significant for HIV, hypertension, history of hepatitis C, history of pneumothorax, GERD, and hyperlipidemia and was recently hospitalized for sepsis related to cellulitis of the lower extremities.   Patient initially presented voluntarily as a walk in to [facility] on 08/31/23, accompanied by GPD with complaints of passive suicidal ideations and wanting to detox from alcohol.  Patient was noted to have a Rt arm PICC line and was being treated with IV Ancef  for cellulitis of the lower extremities leading to sepsis with one day remaining for the completion of the course. Patient was then transferred to [facility] for medical clearance and IV abt tx completion and is now medically cleared and back to the [facility] to continue with his substance abuse/detox tx.     Patient was seen face to face by this provider and chart reviewed. Patient is noted to have a dressing on his RUA, no PICC line present. He reports the PICC line was taken out after he completed his antibiotics treatment yesterday..  Patient reports eagerness to continue his alcohol detox treatment.  He reports he last drank 40-oz beer on Thursday between 12-1 PM.  He endorses daily alcohol consumption and states  I drink every opportunity I get, but I'm getting tired of it.  He denies history of withdrawal seizures.  He endorses history of attending alcohol rehab programs. He lives with his brother. He is not established with outpatient psychiatry or therapy.   He is linked with his PCP and an infectious  disease specialist.   On evaluation, patient is alert, oriented x 4, and cooperative. Speech is clear and coherent. Pt appears casually dressed. Eye contact is good. Mood is euthymic, affect is congruent with mood. Thought process is coherent and goal directed and thought content is WDL. Pt denies SI/HI/AVH. There is no objective indication that the patient is responding to internal stimuli. No delusions elicited during this assessment.    Discussed recommendation for admission to the [facility] for substance abuse treatment/detox.   Discussed transferred to the continuous observation unit for safety monitoring/treatment pending bed availability at the [facility].  Patient is provided with opportunity for questions.  He verbalized understanding and is in agreement.  Total Time spent with patient: 20 minutes  Musculoskeletal  Strength & Muscle Tone: within normal limits Gait & Station: normal Patient leans: N/A  Psychiatric Specialty Exam  Presentation General Appearance:  Casual  Eye Contact: Good  Speech: Clear and Coherent  Speech Volume: Normal  Handedness: Right   Mood and Affect  Mood: Euthymic  Affect: Congruent   Thought Process  Thought Processes: Coherent  Descriptions of Associations:Intact  Orientation:Full (Time, Place and Person)  Thought Content:WDL  Diagnosis of Schizophrenia or Schizoaffective disorder in past: Yes   Hallucinations:Hallucinations: None  Ideas of Reference:None  Suicidal Thoughts:Suicidal Thoughts: No SI Passive Intent and/or Plan: Without Intent; Without Plan  Homicidal Thoughts:Homicidal Thoughts: No   Sensorium  Memory: Immediate Fair  Judgment: Poor  Insight: Shallow   Executive Functions  Concentration: Fair  Attention Span: Fair  Recall: Fair  Fund of Knowledge: Fair  Language:  Fair   Psychomotor Activity  Psychomotor Activity: Psychomotor Activity: Normal   Assets  Assets: Communication Skills;  Desire for Improvement   Sleep  Sleep: Sleep: Fair Number of Hours of Sleep: 6   Nutritional Assessment (For OBS and [facility]BC admissions only) Has the patient had a weight loss or gain of 10 pounds or more in the last 3 months?: No Has the patient had a decrease in food intake/or appetite?: No Does the patient have dental problems?: No Does the patient have eating habits or behaviors that may be indicators of an eating disorder including binging or inducing vomiting?: No Has the patient recently lost weight without trying?: 0 Has the patient been eating poorly because of a decreased appetite?: 0 Malnutrition Screening Tool Score: 0    Physical Exam Constitutional:      Appearance: He is not toxic-appearing or diaphoretic.  HENT:     Nose: No congestion.  Cardiovascular:     Rate and Rhythm: Normal rate.  Pulmonary:     Effort: No respiratory distress.  Chest:     Chest wall: No tenderness.  Neurological:     Mental Status: He is alert and oriented to person, place, and time.  Psychiatric:        Attention and Perception: Attention and perception normal.        Mood and Affect: Mood and affect normal.        Speech: Speech normal.        Behavior: Behavior is cooperative.        Thought Content: Thought content normal.    Review of Systems  Constitutional:  Negative for chills, diaphoresis and fever.  HENT:  Negative for congestion.   Eyes:  Negative for discharge.  Respiratory:  Negative for cough, shortness of breath and wheezing.   Cardiovascular:  Negative for chest pain and palpitations.  Gastrointestinal:  Negative for diarrhea, nausea and vomiting.  Neurological:  Negative for dizziness, seizures, weakness and headaches.  Psychiatric/Behavioral:  Positive for substance abuse.     Blood pressure (!) 131/95, pulse 73, temperature 97.7 F (36.5 C), temperature source Oral, resp. rate 18, SpO2 90%. There is no height or weight on file to calculate BMI.  Past  Psychiatric History: See H & P   Is the patient at risk to self? Yes  Has the patient been a risk to self in the past 6 months? Yes .    Has the patient been a risk to self within the distant past? Yes   Is the patient a risk to others? No   Has the patient been a risk to others in the past 6 months? No   Has the patient been a risk to others within the distant past? No   Past Medical History: See Chart  Family History: N/A  Social History: N/A  Last Labs:  Admission on 08/31/2023, Discharged on 08/31/2023  Component Date Value Ref Range Status   Sodium 08/31/2023 136  135 - 145 mmol/L Final   Potassium 08/31/2023 3.6  3.5 - 5.1 mmol/L Final   Chloride 08/31/2023 104  98 - 111 mmol/L Final   CO2 08/31/2023 25  22 - 32 mmol/L Final   Glucose, Bld 08/31/2023 88  70 - 99 mg/dL Final   Glucose reference range applies only to samples taken after fasting for at least 8 hours.   BUN 08/31/2023 13  8 - 23 mg/dL Final   Creatinine, Ser 08/31/2023 0.75  0.61 - 1.24 mg/dL Final  Calcium  08/31/2023 9.0  8.9 - 10.3 mg/dL Final   Total Protein 92/75/7974 7.9  6.5 - 8.1 g/dL Final   Albumin 92/75/7974 4.0  3.5 - 5.0 g/dL Final   AST 92/75/7974 26  15 - 41 U/L Final   ALT 08/31/2023 20  0 - 44 U/L Final   Alkaline Phosphatase 08/31/2023 64  38 - 126 U/L Final   Total Bilirubin 08/31/2023 0.7  0.0 - 1.2 mg/dL Final   GFR, Estimated 08/31/2023 >60  >60 mL/min Final   Comment: (NOTE) Calculated using the CKD-EPI Creatinine Equation (2021)    Anion gap 08/31/2023 7  5 - 15 Final   Performed at [facility], [address] [place], [place] [address]   WBC 08/31/2023 7.8  4.0 - 10.5 K/uL Final   RBC 08/31/2023 4.55  4.22 - 5.81 MIL/uL Final   Hemoglobin 08/31/2023 13.0  13.0 - 17.0 g/dL Final   HCT 92/75/7974 39.7  39.0 - 52.0 % Final   MCV 08/31/2023 87.3  80.0 - 100.0 fL Final   MCH 08/31/2023 28.6  26.0 - 34.0 pg Final   MCHC 08/31/2023 32.7  30.0 - 36.0 g/dL Final   RDW 92/75/7974  13.9  11.5 - 15.5 % Final   Platelets 08/31/2023 257  150 - 400 K/uL Final   nRBC 08/31/2023 0.0  0.0 - 0.2 % Final   Neutrophils Relative % 08/31/2023 52  % Final   Neutro Abs 08/31/2023 4.0  1.7 - 7.7 K/uL Final   Lymphocytes Relative 08/31/2023 33  % Final   Lymphs Abs 08/31/2023 2.6  0.7 - 4.0 K/uL Final   Monocytes Relative 08/31/2023 9  % Final   Monocytes Absolute 08/31/2023 0.7  0.1 - 1.0 K/uL Final   Eosinophils Relative 08/31/2023 5  % Final   Eosinophils Absolute 08/31/2023 0.4  0.0 - 0.5 K/uL Final   Basophils Relative 08/31/2023 1  % Final   Basophils Absolute 08/31/2023 0.1  0.0 - 0.1 K/uL Final   Immature Granulocytes 08/31/2023 0  % Final   Abs Immature Granulocytes 08/31/2023 0.01  0.00 - 0.07 K/uL Final   Performed at [facility], [address], [place], [place] [address]   Acetaminophen  (Tylenol ), Serum 08/31/2023 <10 (L)  10 - 30 ug/mL Final   Comment: (NOTE) Therapeutic concentrations vary significantly. A range of 10-30 ug/mL  may be an effective concentration for many patients. However, some  are best treated at concentrations outside of this range. Acetaminophen  concentrations >150 ug/mL at 4 hours after ingestion  and >50 ug/mL at 12 hours after ingestion are often associated with  toxic reactions.  Performed at [facility], [address], [place], [place] [address]    Alcohol, Ethyl (B) 08/31/2023 <15  <15 mg/dL Final   Comment: (NOTE) For medical purposes only. Performed at [facility], [address], [place], [place] [address]    Salicylate Lvl 08/31/2023 <7.0 (L)  7.0 - 30.0 mg/dL Final   Performed at [facility], [address] [place] [address] [place], [place] [address]   Opiates 08/31/2023 NONE DETECTED  NONE DETECTED Final   Cocaine 08/31/2023 POSITIVE (A)  NONE DETECTED Final   Benzodiazepines 08/31/2023 NONE DETECTED  NONE DETECTED Final   Amphetamines 08/31/2023 NONE DETECTED  NONE DETECTED Final   Tetrahydrocannabinol  08/31/2023 NONE DETECTED  NONE DETECTED Final   Barbiturates 08/31/2023 NONE DETECTED  NONE DETECTED Final   Comment: (NOTE) DRUG SCREEN FOR MEDICAL PURPOSES ONLY.  IF CONFIRMATION IS NEEDED FOR ANY PURPOSE, NOTIFY LAB WITHIN 5 DAYS.  LOWEST DETECTABLE LIMITS FOR URINE DRUG SCREEN Drug Class                     Cutoff (ng/mL) Amphetamine and metabolites    1000 Barbiturate and metabolites    200 Benzodiazepine                 200 Opiates and metabolites        300 Cocaine and metabolites        300 THC                            50 Performed at [facility], [address], [place], [place] [address]    Specimen Source 08/31/2023 URINE, CLEAN CATCH   Final   Color, Urine 08/31/2023 YELLOW  YELLOW Final   APPearance 08/31/2023 CLEAR  CLEAR Final   Specific Gravity, Urine 08/31/2023 1.024  1.005 - 1.030 Final   pH 08/31/2023 5.0  5.0 - 8.0 Final   Glucose, UA 08/31/2023 NEGATIVE  NEGATIVE mg/dL Final   Hgb urine dipstick 08/31/2023 NEGATIVE  NEGATIVE Final   Bilirubin Urine 08/31/2023 NEGATIVE  NEGATIVE Final   Ketones, ur 08/31/2023 NEGATIVE  NEGATIVE mg/dL Final   Protein, ur 92/75/7974 NEGATIVE  NEGATIVE mg/dL Final   Nitrite 92/75/7974 NEGATIVE  NEGATIVE Final   Leukocytes,Ua 08/31/2023 NEGATIVE  NEGATIVE Final   RBC / HPF 08/31/2023 0-5  0 - 5 RBC/hpf Final   WBC, UA 08/31/2023 0-5  0 - 5 WBC/hpf Final   Comment:        Reflex urine culture not performed if WBC <=10, OR if Squamous epithelial cells >5. If Squamous epithelial cells >5 suggest recollection.    Bacteria, UA 08/31/2023 NONE SEEN  NONE SEEN Final   Squamous Epithelial / HPF 08/31/2023 0-5  0 - 5 /HPF Final   Mucus 08/31/2023 PRESENT   Final   Performed at [facility], [address] [place], [place] [address]  Office Visit on 08/29/2023  Component Date Value Ref Range Status   WBC 08/29/2023 7.1  3.4 - 10.8 x10E3/uL Final   RBC 08/29/2023 4.78  4.14 - 5.80 x10E6/uL Final   Hemoglobin  08/29/2023 13.2  13.0 - 17.7 g/dL Final   Hematocrit 92/77/7974 42.6  37.5 - 51.0 % Final   MCV 08/29/2023 89  79 - 97 fL Final   MCH 08/29/2023 27.6  26.6 - 33.0 pg Final   MCHC 08/29/2023 31.0 (L)  31.5 - 35.7 g/dL Final   RDW 92/77/7974 13.3  11.6 - 15.4 % Final   Platelets 08/29/2023 251  150 - 450 x10E3/uL Final   Neutrophils 08/29/2023 54  Not Estab. % Final   Lymphs 08/29/2023 30  Not Estab. % Final   Monocytes 08/29/2023 11  Not Estab. % Final   Eos 08/29/2023 4  Not Estab. % Final   Basos 08/29/2023 1  Not Estab. % Final   Neutrophils Absolute 08/29/2023 3.8  1.4 - 7.0 x10E3/uL Final   Lymphocytes Absolute 08/29/2023 2.1  0.7 - 3.1 x10E3/uL Final   Monocytes Absolute 08/29/2023 0.8  0.1 - 0.9 x10E3/uL Final   EOS (ABSOLUTE) 08/29/2023 0.3  0.0 - 0.4 x10E3/uL Final   Basophils Absolute 08/29/2023 0.1  0.0 - 0.2 x10E3/uL Final   Immature Granulocytes 08/29/2023 0  Not Estab. % Final   Immature Grans (Abs)  08/29/2023 0.0  0.0 - 0.1 x10E3/uL Final   CRP 08/29/2023 4  0 - 10 mg/L Final   Sed Rate 08/29/2023 35 (H)  0 - 30 mm/hr Final   Glucose 08/29/2023 92  70 - 99 mg/dL Final   BUN 92/77/7974 14  8 - 27 mg/dL Final   Creatinine, Ser 08/29/2023 0.88  0.76 - 1.27 mg/dL Final   eGFR 92/77/7974 98  >59 mL/min/1.73 Final   BUN/Creatinine Ratio 08/29/2023 16  10 - 24 Final   Sodium 08/29/2023 140  134 - 144 mmol/L Final   Potassium 08/29/2023 4.1  3.5 - 5.2 mmol/L Final   Chloride 08/29/2023 102  96 - 106 mmol/L Final   CO2 08/29/2023 20  20 - 29 mmol/L Final   Calcium  08/29/2023 9.2  8.6 - 10.2 mg/dL Final  No results displayed because visit has over 200 results.    Office Visit on 08/10/2023  Component Date Value Ref Range Status   HIV 1 RNA Quant 08/10/2023 2,420 (H)  NOT DETECTED copies/mL Final   HIV-1 RNA Quant, Log 08/10/2023 3.38 (H)  NOT DETECTED Log copies/mL Final   Comment: . This test was performed using Real-Time Polymerase Chain Reaction. . Reportable Range: 20  copies/mL to 10,000,000 copies/mL (1.30 log copies/mL to 7.00 log copies/mL).    WBC 08/10/2023 5.6  3.8 - 10.8 Thousand/uL Final   RBC 08/10/2023 4.98  4.20 - 5.80 Million/uL Final   Hemoglobin 08/10/2023 13.9  13.2 - 17.1 g/dL Final   HCT 92/96/7974 43.7  38.5 - 50.0 % Final   MCV 08/10/2023 87.8  80.0 - 100.0 fL Final   MCH 08/10/2023 27.9  27.0 - 33.0 pg Final   MCHC 08/10/2023 31.8 (L)  32.0 - 36.0 g/dL Final   Comment: For adults, a slight decrease in the calculated MCHC value (in the range of 30 to 32 g/dL) is most likely not clinically significant; however, it should be interpreted with caution in correlation with other red cell parameters and the patient's clinical condition.    RDW 08/10/2023 13.2  11.0 - 15.0 % Final   Platelets 08/10/2023 313  140 - 400 Thousand/uL Final   MPV 08/10/2023 9.7  7.5 - 12.5 fL Final   Glucose, Bld 08/10/2023 96  65 - 99 mg/dL Final   Comment: .            Fasting reference interval .    BUN 08/10/2023 11  7 - 25 mg/dL Final   Creat 92/96/7974 0.77  0.70 - 1.35 mg/dL Final   BUN/Creatinine Ratio 08/10/2023 SEE NOTE:  6 - 22 (calc) Final   Comment:    Not Reported: BUN and Creatinine are within    reference range. .    Sodium 08/10/2023 140  135 - 146 mmol/L Final   Potassium 08/10/2023 3.6  3.5 - 5.3 mmol/L Final   Chloride 08/10/2023 104  98 - 110 mmol/L Final   CO2 08/10/2023 27  20 - 32 mmol/L Final   Calcium  08/10/2023 9.2  8.6 - 10.3 mg/dL Final   Total Protein 92/96/7974 7.4  6.1 - 8.1 g/dL Final   Albumin 92/96/7974 4.3  3.6 - 5.1 g/dL Final   Globulin 92/96/7974 3.1  1.9 - 3.7 g/dL (calc) Final   AG Ratio 08/10/2023 1.4  1.0 - 2.5 (calc) Final   Total Bilirubin 08/10/2023 0.3  0.2 - 1.2 mg/dL Final   Alkaline phosphatase (APISO) 08/10/2023 75  35 - 144 U/L Final   AST 08/10/2023  29  10 - 35 U/L Final   ALT 08/10/2023 27  9 - 46 U/L Final    Allergies: Patient has no known allergies.  Medications:  Facility Ordered  Medications  Medication   acetaminophen  (TYLENOL ) tablet 650 mg   thiamine (VITAMIN B1) injection 100 mg   chlordiazePOXIDE (LIBRIUM) capsule 25 mg   hydrOXYzine  (ATARAX ) tablet 25 mg   OLANZapine zydis (ZYPREXA) disintegrating tablet 5 mg   OLANZapine (ZYPREXA) injection 5 mg   traZODone (DESYREL) tablet 50 mg   bictegravir-emtricitabine -tenofovir  AF (BIKTARVY ) 50-200-25 MG per tablet 1 tablet   hydrochlorothiazide  (HYDRODIURIL ) tablet 25 mg   dicyclomine (BENTYL) tablet 20 mg   loperamide (IMODIUM) capsule 2-4 mg   methocarbamol (ROBAXIN) tablet 500 mg   ondansetron  (ZOFRAN -ODT) disintegrating tablet 4 mg   PTA Medications  Medication Sig   acetaminophen  (TYLENOL ) 500 MG tablet Take 2 tablets (1,000 mg total) by mouth every 8 (eight) hours as needed. Do not take if not in pain. (Patient taking differently: Take 1,000 mg by mouth every 8 (eight) hours as needed for mild pain (pain score 1-3).)   triamcinolone  ointment (KENALOG ) 0.5 % Apply topically 3 (three) times daily.   rosuvastatin  (CRESTOR ) 10 MG tablet Take 1 tablet (10 mg total) by mouth daily.   hydrochlorothiazide  (HYDRODIURIL ) 25 MG tablet Take 1 tablet (25 mg total) by mouth daily.   clobetasol  ointment (TEMOVATE ) 0.05 % Apply 1 Application topically 2 (two) times daily. (Patient not taking: Reported on 08/16/2023)   bictegravir-emtricitabine -tenofovir  AF (BIKTARVY ) 50-200-25 MG TABS tablet Take 1 tablet by mouth daily.   [EXPIRED] ceFAZolin  (ANCEF ) IVPB Inject 2 g into the vein every 8 (eight) hours for 9 days. Indication:  MSSA Bacteremia First Dose: Yes Last Day of Therapy:  08/31/23 Labs - Once weekly:  CBC/D and BMP, Labs - Once weekly: ESR and CRP Method of administration: IV Push Method of administration may be changed at the discretion of home infusion pharmacist based upon assessment of the patient and/or caregiver's ability to self-administer the medication ordered.      Medical Decision Making  Recommend  admission to the [facility] when bed available for alcohol detox/TX.  Recommend CIWA protocol meds-AUD Recommend [facility] protocol meds-SUD/Cocaine abuse Patient will be admitted to the continuous observation unit for safety monitoring and treatment pending transfer to the [facility].  I have reviewed the labs and medications administered at  [facility]: CMP-WNL, CBC w/diff-WNL, Urinalysis,-WNL, BAL <15,, UDS positive cocaine.  EKG ordered.  Home medications continued -Hydrodiuril  25 mg Po daily for HTN -Biktarvy  1 tablet  Po daily for HIV disease   Other prns -Trazodone 50 mg Po daily at bedtime for insomnia -Prn agitation protocol medications -Tylenol  650 mg PO daily as needed for pain  Recommendations  Based on my evaluation the patient does not appear to have an emergency medical condition.  Recommend admission to the [facility] when bed available for alcohol detox/TX   Arsalan Brisbin LULLA Ivans, NP 09/01/23  1:17 AM

## 2023-09-01 NOTE — Discharge Instructions (Addendum)

## 2023-09-01 NOTE — Telephone Encounter (Signed)
 Received call from Ted Curtis, RN with [phone] [facility] requesting pull PICC orders. Relayed provider's OPAT orders have end date of 7/24 and that okay to pull PICC after last dose.   Iosefa Weintraub, BSN, RN

## 2023-09-02 DIAGNOSIS — Z0279 Encounter for issue of other medical certificate: Secondary | ICD-10-CM | POA: Diagnosis not present

## 2023-09-02 DIAGNOSIS — F1420 Cocaine dependence, uncomplicated: Secondary | ICD-10-CM | POA: Diagnosis present

## 2023-09-02 DIAGNOSIS — F1414 Cocaine abuse with cocaine-induced mood disorder: Secondary | ICD-10-CM | POA: Diagnosis present

## 2023-09-02 LAB — HEPATITIS PANEL, ACUTE
HCV Ab: REACTIVE — AB
Hep A IgM: NONREACTIVE
Hep B C IgM: NONREACTIVE
Hepatitis B Surface Ag: NONREACTIVE

## 2023-09-02 LAB — HEMOGLOBIN A1C
Hgb A1c MFr Bld: 5.5 % (ref 4.8–5.6)
Mean Plasma Glucose: 111.15 mg/dL

## 2023-09-02 LAB — SARS CORONAVIRUS 2 BY RT PCR: SARS Coronavirus 2 by RT PCR: NEGATIVE

## 2023-09-02 LAB — VITAMIN B12: Vitamin B-12: 292 pg/mL (ref 180–914)

## 2023-09-02 LAB — MAGNESIUM: Magnesium: 1.9 mg/dL (ref 1.7–2.4)

## 2023-09-02 LAB — VITAMIN D 25 HYDROXY (VIT D DEFICIENCY, FRACTURES): Vit D, 25-Hydroxy: 16.73 ng/mL — ABNORMAL LOW (ref 30–100)

## 2023-09-02 LAB — AMMONIA: Ammonia: 18 umol/L (ref 9–35)

## 2023-09-02 MED ORDER — MIRTAZAPINE 7.5 MG PO TABS
7.5000 mg | ORAL_TABLET | Freq: Every day | ORAL | Status: DC
  Administered 2023-09-02 – 2023-09-03 (×2): 7.5 mg via ORAL
  Filled 2023-09-02 (×2): qty 1

## 2023-09-02 MED ORDER — PANTOPRAZOLE SODIUM 40 MG PO TBEC
40.0000 mg | DELAYED_RELEASE_TABLET | Freq: Every day | ORAL | Status: DC
  Administered 2023-09-03 – 2023-09-06 (×4): 40 mg via ORAL
  Filled 2023-09-02 (×4): qty 1

## 2023-09-02 MED ORDER — OLANZAPINE 5 MG PO TABS
5.0000 mg | ORAL_TABLET | Freq: Every day | ORAL | Status: DC
  Administered 2023-09-02 – 2023-09-03 (×2): 5 mg via ORAL
  Filled 2023-09-02 (×2): qty 1

## 2023-09-02 NOTE — ED Provider Notes (Signed)
 Facility Based Crisis Admission H&P  Date: 09/02/23 Patient Name: William [name] MRN: [id number] Chief Complaint: alcohol and hearing voices  Diagnoses:  Final diagnoses:  Alcohol-induced mood disorder (HCC)  Cocaine abuse with cocaine-induced mood disorder (HCC)  Alcohol dependence with uncomplicated withdrawal (HCC)    HPI: 63 YO M with HIV, recent treatment for sepsis, chronic BLE wounds, alcoholism and other medical illness who presented initially with complaints of suicidal ideation and was admitted to OBS at the [facility] and later transferred to [facility] for further evaluation and management of substance and mood issues.   On interview the patient is pleasant and cooperative. He feels that his mood has improved and his previous auditory hallucinations have calmed down. He has rare visual hallucinations. He is no longer thinking about death and dying. He has no thoughts of harm to others. He is not sleeping well and describes and on and off pattern of poor sleep with low energy that has been going on for some time. He has been having a lot of health stressors and his ambulation is much slower with his BLE issues. He has been having dressing and wound care on Mondays. He has been using alcohol regularly, and UDS was also + cocaine.   PHQ 2-9:  [employer] Office Visit from 10/26/2022 in [facility] - A Dept Of [place]. [facility] Most recent reading at 10/26/2022  3:19 PM Clinical Support from 03/15/2022 in [facility] - A Dept Of [place]. [facility] Most recent reading at 03/15/2022 10:41 AM Office Visit from 03/15/2022 in [facility] - A Dept Of [place]. [facility] Most recent reading at 03/15/2022  8:49 AM  Thoughts that you would be better off dead, or of hurting yourself in some way Not at all Not at all Not at all  PHQ-9 Total Score 0 0 0    Flowsheet Row ED from 09/01/2023 in [facility] Most recent reading at 09/02/2023  4:42 AM ED from 08/31/2023 in [facility] Most recent reading at 09/01/2023  1:16 AM ED from 08/31/2023 in [facility] Emergency Department at [facility] Most recent reading at 08/31/2023  6:33 PM  C-SSRS RISK CATEGORY Error: Q3, 4, or 5 should not be populated when Q2 is No Moderate Risk High Risk      Total Time spent with patient: 30 minutes  Musculoskeletal  Strength & Muscle Tone: abnormal Gait & Station: normal Patient leans: N/A  Psychiatric Specialty Exam  Presentation General Appearance:  Casual  Eye Contact: Fair  Speech: Normal Rate  Speech Volume: Decreased  Handedness: Right   Mood and Affect  Mood: Depressed  Affect: Congruent   Thought Process  Thought Processes: Linear  Descriptions of Associations:Intact  Orientation:Full (Time, Place and Person)  Thought Content:Logical  Diagnosis of Schizophrenia or Schizoaffective disorder in past: No   Hallucinations:Hallucinations: Auditory; Visual  Ideas of Reference:None  Suicidal Thoughts:Suicidal Thoughts: No SI Passive Intent and/or Plan: Without Intent; Without Plan  Homicidal Thoughts:Homicidal Thoughts: No   Sensorium  Memory: Immediate Fair; Recent Fair; Remote Fair  Judgment: Fair  Insight: Fair   Art therapist  Concentration: Fair  Attention Span: Fair  Recall: Fair  Fund of Knowledge: Good  Language: Good   Psychomotor Activity  Psychomotor Activity: Psychomotor Activity: Decreased   Assets  Assets: Desire for Improvement; Leisure Time   Sleep  Sleep: Sleep: Poor  Nutritional Assessment (For OBS and FBC admissions only) Has the patient had a weight loss or gain of 10 pounds or more in the last 3 months?: No Has the patient had a decrease in food intake/or appetite?: No Does the patient have dental problems?: Yes Does the patient have eating habits or  behaviors that may be indicators of an eating disorder including binging or inducing vomiting?: No Has the patient recently lost weight without trying?: 0 Has the patient been eating poorly because of a decreased appetite?: 0 Malnutrition Screening Tool Score: 0    Physical Exam Vitals and nursing note reviewed.  HENT:     Head: Normocephalic.  Eyes:     Extraocular Movements: Extraocular movements intact.  Pulmonary:     Effort: Pulmonary effort is normal.  Musculoskeletal:        General: Normal range of motion.     Cervical back: Normal range of motion.  Feet:     Comments: Both feet in bandage (changed Mondays) R >L Neurological:     General: No focal deficit present.     Mental Status: He is alert and oriented to person, place, and time.  Psychiatric:        Behavior: Behavior normal.    Review of Systems  Constitutional:  Negative for chills and fever.  Respiratory:  Negative for cough.   Cardiovascular:  Negative for chest pain.  Gastrointestinal:  Negative for constipation, diarrhea, nausea and vomiting.  Genitourinary:  Negative for dysuria.  Musculoskeletal:  Positive for joint pain.       BLE pain  Skin:        Chronic wounds to feet R>L  Neurological:  Negative for dizziness and headaches.  Psychiatric/Behavioral:  Negative for hallucinations and suicidal ideas.     Blood pressure 111/80, pulse 64, temperature 98.6 F (37 C), temperature source Oral, resp. rate 18, SpO2 96%. There is no height or weight on file to calculate BMI.  Past Psychiatric History: previously diagnosed with alcohol use disorder.  No previous suicide attempts.  Is the patient at risk to self? No  Has the patient been a risk to self in the past 6 months? Yes .    Has the patient been a risk to self within the distant past? No   Is the patient a risk to others? No   Has the patient been a risk to others in the past 6 months? No   Has the patient been a risk to others within the  distant past? No   Past Medical History: HIV, hypertension, history of hepatitis C, history of pneumothorax, GERD, hyperlipidemia and sepsis related to cellulitis of the lower extremities  Family History: adopted Social History: mother died recently. Patient living with brother. Using alcohol since teens, currently 2-4 40 ounce beers per day. Former smoker. Previously worked as a Arboriculturist.   Last Labs:  Admission on 08/31/2023, Discharged on 08/31/2023  Component Date Value Ref Range Status   Sodium 08/31/2023 136  135 - 145 mmol/L Final   Potassium 08/31/2023 3.6  3.5 - 5.1 mmol/L Final   Chloride 08/31/2023 104  98 - 111 mmol/L Final   CO2 08/31/2023 25  22 - 32 mmol/L Final   Glucose, Bld 08/31/2023 88  70 - 99 mg/dL Final   Glucose reference range applies only to samples taken after fasting for at least 8 hours.   BUN 08/31/2023 13  8 - 23 mg/dL Final   Creatinine, Ser 08/31/2023 0.75  0.61 -  1.24 mg/dL Final   Calcium  08/31/2023 9.0  8.9 - 10.3 mg/dL Final   Total Protein 92/75/7974 7.9  6.5 - 8.1 g/dL Final   Albumin 92/75/7974 4.0  3.5 - 5.0 g/dL Final   AST 92/75/7974 26  15 - 41 U/L Final   ALT 08/31/2023 20  0 - 44 U/L Final   Alkaline Phosphatase 08/31/2023 64  38 - 126 U/L Final   Total Bilirubin 08/31/2023 0.7  0.0 - 1.2 mg/dL Final   GFR, Estimated 08/31/2023 >60  >60 mL/min Final   Comment: (NOTE) Calculated using the CKD-EPI Creatinine Equation (2021)    Anion gap 08/31/2023 7  5 - 15 Final   Performed at [facility], [address], [place], [place] [address]   WBC 08/31/2023 7.8  4.0 - 10.5 K/uL Final   RBC 08/31/2023 4.55  4.22 - 5.81 MIL/uL Final   Hemoglobin 08/31/2023 13.0  13.0 - 17.0 g/dL Final   HCT 92/75/7974 39.7  39.0 - 52.0 % Final   MCV 08/31/2023 87.3  80.0 - 100.0 fL Final   MCH 08/31/2023 28.6  26.0 - 34.0 pg Final   MCHC 08/31/2023 32.7  30.0 - 36.0 g/dL Final   RDW 92/75/7974 13.9  11.5 - 15.5 % Final   Platelets 08/31/2023 257  150 -  400 K/uL Final   nRBC 08/31/2023 0.0  0.0 - 0.2 % Final   Neutrophils Relative % 08/31/2023 52  % Final   Neutro Abs 08/31/2023 4.0  1.7 - 7.7 K/uL Final   Lymphocytes Relative 08/31/2023 33  % Final   Lymphs Abs 08/31/2023 2.6  0.7 - 4.0 K/uL Final   Monocytes Relative 08/31/2023 9  % Final   Monocytes Absolute 08/31/2023 0.7  0.1 - 1.0 K/uL Final   Eosinophils Relative 08/31/2023 5  % Final   Eosinophils Absolute 08/31/2023 0.4  0.0 - 0.5 K/uL Final   Basophils Relative 08/31/2023 1  % Final   Basophils Absolute 08/31/2023 0.1  0.0 - 0.1 K/uL Final   Immature Granulocytes 08/31/2023 0  % Final   Abs Immature Granulocytes 08/31/2023 0.01  0.00 - 0.07 K/uL Final   Performed at [facility], [address] [place], [place] [address]   Acetaminophen  (Tylenol ), Serum 08/31/2023 <10 (L)  10 - 30 ug/mL Final   Comment: (NOTE) Therapeutic concentrations vary significantly. A range of 10-30 ug/mL  may be an effective concentration for many patients. However, some  are best treated at concentrations outside of this range. Acetaminophen  concentrations >150 ug/mL at 4 hours after ingestion  and >50 ug/mL at 12 hours after ingestion are often associated with  toxic reactions.  Performed at [facility], [address] [place], [place] [address]    Alcohol, Ethyl (B) 08/31/2023 <15  <15 mg/dL Final   Comment: (NOTE) For medical purposes only. Performed at [facility], [address], [place], [place] [address]    Salicylate Lvl 08/31/2023 <7.0 (L)  7.0 - 30.0 mg/dL Final   Performed at [facility], [address] [place], [place] [address]   Opiates 08/31/2023 NONE DETECTED  NONE DETECTED Final   Cocaine 08/31/2023 POSITIVE (A)  NONE DETECTED Final   Benzodiazepines 08/31/2023 NONE DETECTED  NONE DETECTED Final   Amphetamines 08/31/2023 NONE DETECTED  NONE DETECTED Final   Tetrahydrocannabinol 08/31/2023 NONE DETECTED  NONE DETECTED Final   Barbiturates  08/31/2023 NONE DETECTED  NONE DETECTED Final   Comment: (NOTE) DRUG  SCREEN FOR MEDICAL PURPOSES ONLY.  IF CONFIRMATION IS NEEDED FOR ANY PURPOSE, NOTIFY LAB WITHIN 5 DAYS.  LOWEST DETECTABLE LIMITS FOR URINE DRUG SCREEN Drug Class                     Cutoff (ng/mL) Amphetamine and metabolites    1000 Barbiturate and metabolites    200 Benzodiazepine                 200 Opiates and metabolites        300 Cocaine and metabolites        300 THC                            50 Performed at [facility], [address], [place], [place] [address]    Specimen Source 08/31/2023 URINE, CLEAN CATCH   Final   Color, Urine 08/31/2023 YELLOW  YELLOW Final   APPearance 08/31/2023 CLEAR  CLEAR Final   Specific Gravity, Urine 08/31/2023 1.024  1.005 - 1.030 Final   pH 08/31/2023 5.0  5.0 - 8.0 Final   Glucose, UA 08/31/2023 NEGATIVE  NEGATIVE mg/dL Final   Hgb urine dipstick 08/31/2023 NEGATIVE  NEGATIVE Final   Bilirubin Urine 08/31/2023 NEGATIVE  NEGATIVE Final   Ketones, ur 08/31/2023 NEGATIVE  NEGATIVE mg/dL Final   Protein, ur 92/75/7974 NEGATIVE  NEGATIVE mg/dL Final   Nitrite 92/75/7974 NEGATIVE  NEGATIVE Final   Leukocytes,Ua 08/31/2023 NEGATIVE  NEGATIVE Final   RBC / HPF 08/31/2023 0-5  0 - 5 RBC/hpf Final   WBC, UA 08/31/2023 0-5  0 - 5 WBC/hpf Final   Comment:        Reflex urine culture not performed if WBC <=10, OR if Squamous epithelial cells >5. If Squamous epithelial cells >5 suggest recollection.    Bacteria, UA 08/31/2023 NONE SEEN  NONE SEEN Final   Squamous Epithelial / HPF 08/31/2023 0-5  0 - 5 /HPF Final   Mucus 08/31/2023 PRESENT   Final   Performed at [facility], [address] [place], [place] [address]  Office Visit on 08/29/2023  Component Date Value Ref Range Status   WBC 08/29/2023 7.1  3.4 - 10.8 x10E3/uL Final   RBC 08/29/2023 4.78  4.14 - 5.80 x10E6/uL Final   Hemoglobin 08/29/2023 13.2  13.0 - 17.7 g/dL Final   Hematocrit 92/77/7974  42.6  37.5 - 51.0 % Final   MCV 08/29/2023 89  79 - 97 fL Final   MCH 08/29/2023 27.6  26.6 - 33.0 pg Final   MCHC 08/29/2023 31.0 (L)  31.5 - 35.7 g/dL Final   RDW 92/77/7974 13.3  11.6 - 15.4 % Final   Platelets 08/29/2023 251  150 - 450 x10E3/uL Final   Neutrophils 08/29/2023 54  Not Estab. % Final   Lymphs 08/29/2023 30  Not Estab. % Final   Monocytes 08/29/2023 11  Not Estab. % Final   Eos 08/29/2023 4  Not Estab. % Final   Basos 08/29/2023 1  Not Estab. % Final   Neutrophils Absolute 08/29/2023 3.8  1.4 - 7.0 x10E3/uL Final   Lymphocytes Absolute 08/29/2023 2.1  0.7 - 3.1 x10E3/uL Final   Monocytes Absolute 08/29/2023 0.8  0.1 - 0.9 x10E3/uL Final   EOS (ABSOLUTE) 08/29/2023 0.3  0.0 - 0.4 x10E3/uL Final   Basophils Absolute 08/29/2023 0.1  0.0 - 0.2 x10E3/uL Final   Immature Granulocytes 08/29/2023 0  Not Estab. %  Final   Immature Grans (Abs) 08/29/2023 0.0  0.0 - 0.1 x10E3/uL Final   CRP 08/29/2023 4  0 - 10 mg/L Final   Sed Rate 08/29/2023 35 (H)  0 - 30 mm/hr Final   Glucose 08/29/2023 92  70 - 99 mg/dL Final   BUN 92/77/7974 14  8 - 27 mg/dL Final   Creatinine, Ser 08/29/2023 0.88  0.76 - 1.27 mg/dL Final   eGFR 92/77/7974 98  >59 mL/min/1.73 Final   BUN/Creatinine Ratio 08/29/2023 16  10 - 24 Final   Sodium 08/29/2023 140  134 - 144 mmol/L Final   Potassium 08/29/2023 4.1  3.5 - 5.2 mmol/L Final   Chloride 08/29/2023 102  96 - 106 mmol/L Final   CO2 08/29/2023 20  20 - 29 mmol/L Final   Calcium  08/29/2023 9.2  8.6 - 10.2 mg/dL Final  No results displayed because visit has over 200 results.    Office Visit on 08/10/2023  Component Date Value Ref Range Status   HIV 1 RNA Quant 08/10/2023 2,420 (H)  NOT DETECTED copies/mL Final   HIV-1 RNA Quant, Log 08/10/2023 3.38 (H)  NOT DETECTED Log copies/mL Final   Comment: . This test was performed using Real-Time Polymerase Chain Reaction. . Reportable Range: 20 copies/mL to 10,000,000 copies/mL (1.30 log copies/mL to 7.00  log copies/mL).    WBC 08/10/2023 5.6  3.8 - 10.8 Thousand/uL Final   RBC 08/10/2023 4.98  4.20 - 5.80 Million/uL Final   Hemoglobin 08/10/2023 13.9  13.2 - 17.1 g/dL Final   HCT 92/96/7974 43.7  38.5 - 50.0 % Final   MCV 08/10/2023 87.8  80.0 - 100.0 fL Final   MCH 08/10/2023 27.9  27.0 - 33.0 pg Final   MCHC 08/10/2023 31.8 (L)  32.0 - 36.0 g/dL Final   Comment: For adults, a slight decrease in the calculated MCHC value (in the range of 30 to 32 g/dL) is most likely not clinically significant; however, it should be interpreted with caution in correlation with other red cell parameters and the patient's clinical condition.    RDW 08/10/2023 13.2  11.0 - 15.0 % Final   Platelets 08/10/2023 313  140 - 400 Thousand/uL Final   MPV 08/10/2023 9.7  7.5 - 12.5 fL Final   Glucose, Bld 08/10/2023 96  65 - 99 mg/dL Final   Comment: .            Fasting reference interval .    BUN 08/10/2023 11  7 - 25 mg/dL Final   Creat 92/96/7974 0.77  0.70 - 1.35 mg/dL Final   BUN/Creatinine Ratio 08/10/2023 SEE NOTE:  6 - 22 (calc) Final   Comment:    Not Reported: BUN and Creatinine are within    reference range. .    Sodium 08/10/2023 140  135 - 146 mmol/L Final   Potassium 08/10/2023 3.6  3.5 - 5.3 mmol/L Final   Chloride 08/10/2023 104  98 - 110 mmol/L Final   CO2 08/10/2023 27  20 - 32 mmol/L Final   Calcium  08/10/2023 9.2  8.6 - 10.3 mg/dL Final   Total Protein 92/96/7974 7.4  6.1 - 8.1 g/dL Final   Albumin 92/96/7974 4.3  3.6 - 5.1 g/dL Final   Globulin 92/96/7974 3.1  1.9 - 3.7 g/dL (calc) Final   AG Ratio 08/10/2023 1.4  1.0 - 2.5 (calc) Final   Total Bilirubin 08/10/2023 0.3  0.2 - 1.2 mg/dL Final   Alkaline phosphatase (APISO) 08/10/2023 75  35 - 144  U/L Final   AST 08/10/2023 29  10 - 35 U/L Final   ALT 08/10/2023 27  9 - 46 U/L Final    Allergies: Patient has no known allergies.  Medications:  Facility Ordered Medications  Medication   [COMPLETED] thiamine  (VITAMIN B1)  injection 100 mg   bictegravir-emtricitabine -tenofovir  AF (BIKTARVY ) 50-200-25 MG per tablet 1 tablet   chlordiazePOXIDE  (LIBRIUM ) capsule 25 mg   dicyclomine  (BENTYL ) tablet 20 mg   hydrochlorothiazide  (HYDRODIURIL ) tablet 25 mg   hydrOXYzine  (ATARAX ) tablet 25 mg   loperamide  (IMODIUM ) capsule 2-4 mg   methocarbamol  (ROBAXIN ) tablet 500 mg   ondansetron  (ZOFRAN -ODT) disintegrating tablet 4 mg   acetaminophen  (TYLENOL ) tablet 650 mg   alum & mag hydroxide-simeth (MAALOX/MYLANTA) 200-200-20 MG/5ML suspension 30 mL   magnesium  hydroxide (MILK OF MAGNESIA) suspension 30 mL   OLANZapine  zydis (ZYPREXA ) disintegrating tablet 5 mg   haloperidol  lactate (HALDOL ) injection 5 mg   And   diphenhydrAMINE  (BENADRYL ) injection 50 mg   And   LORazepam  (ATIVAN ) injection 2 mg   traZODone  (DESYREL ) tablet 50 mg   pantoprazole  (PROTONIX ) EC tablet 40 mg   mirtazapine  (REMERON ) tablet 7.5 mg   OLANZapine  (ZYPREXA ) tablet 5 mg   PTA Medications  Medication Sig   triamcinolone  ointment (KENALOG ) 0.5 % Apply topically 3 (three) times daily.   rosuvastatin  (CRESTOR ) 10 MG tablet Take 1 tablet (10 mg total) by mouth daily.   hydrochlorothiazide  (HYDRODIURIL ) 25 MG tablet Take 1 tablet (25 mg total) by mouth daily.   bictegravir-emtricitabine -tenofovir  AF (BIKTARVY ) 50-200-25 MG TABS tablet Take 1 tablet by mouth daily.    Long Term Goals: Improvement in symptoms so as ready for discharge  Short Term Goals: Patient will verbalize feelings in meetings with treatment team members., Patient will attend at least of 50% of the groups daily., Pt will complete the PHQ9 on admission, day 3 and discharge., Patient will participate in completing the [place] Suicide Severity Rating Scale, and Patient will score a low risk of violence for 24 hours prior to discharge  Medical Decision Making  Patient appears to be having depression in the setting of both substance use and declining health.     Recommendations   Long Term Goals: Improvement in symptoms so as ready for discharge   Short Term Goals: Patient will verbalize feelings in meetings with treatment team members., Patient will attend at least of 50% of the groups daily., Pt will complete the PHQ9 on admission, day 3 and discharge., and Patient will take medications as prescribed daily.  Medications: Mood/anxiety: continue group therapy, milieu therapy, 1:1 evaluation with provider.  Medication management: olanzapine  5mg  PO at bedtime for hallucinations, remeron  7.5 mg PO at bedtime for mood, anxiety and sleep  Substance Abuse:  stimulant use: encourage patient to maintain PO intake. Coverage PRN for secondary psychosis.  Alcohol use disorder: Replacing Thiamine . CIWA monitoring with benzodiazepine coverage per withdrawal scoring. Monitoring HR and BP.  Nicotine  and tobacco use: N/A Medical: PRNs for pain, constipation, indigestion available.  Labs/studies: nutrition panel: patients with substance use and/or mental health disorders frequently have poor PO intake. Additionally, vitamin deficiencies are known to have symptoms such as lethargy, confusion, fatigue, restlessness and sleep disturbance, which can exacerbate mental illness.  LFTs and ammonia: to check for severity of liver dysfunction in the presence of known disease or values that are abnormal. This can include a person that 'should' have elevated LFT and does not, which may indicate advanced liver disease that would have to be  measured by bilirubin and ammonia rather than AST/ALT. Safety and Monitoring: voluntarily admission to [facility]/Facility based care unit [facility]) unit for safety, stabilization and treatment Daily contact with patient to assess and evaluate symptoms and progress in treatment Patient's case to be discussed in multi-disciplinary team meeting Observation Level : q15 minute checks Vital signs: q12 hours Precautions: withdrawal and fall  Based on my evaluation the patient  does not appear to have an emergency medical condition.   Corean Anette Potters, MD 09/02/23  11:56 AM

## 2023-09-02 NOTE — ED Notes (Signed)
 Patient is in the bedroom sleeping. NAD. Will monitor for safety

## 2023-09-02 NOTE — ED Notes (Addendum)
 Patient is very pleasant to be around,and listens to staff.

## 2023-09-02 NOTE — ED Notes (Signed)
 Pt observed lying in bed. Eyes closed respirations even and non labored. NAD q 15 minute observations continue for safety.

## 2023-09-02 NOTE — Group Note (Signed)
 Group Topic: Relaxation  Group Date: 09/02/2023 Start Time: 1330 End Time: 1400 Facilitators: Carletha Iha, RN  Department: [facility]  Number of Participants: 5  Group Focus: relaxation Treatment Modality:  Psychoeducation Interventions utilized were patient education Purpose: enhance coping skills  Name: William [name] Date of Birth: 11-Mar-1960  MR: [id number]    Level of Participation:   did not attend Quality of Participation:  Interactions with others:  Mood/Affect:  Triggers (if applicable):  Cognition:  Progress:  Response:  Plan:   Patients Problems:  Patient Active Problem List   Diagnosis Date Noted   Cocaine dependence (HCC) 09/02/2023   Dilatation of aorta ~71mm  (HCC) 08/18/2023   MSSA bacteremia 08/18/2023   Staphylococcus aureus bacteremia 08/17/2023   Cellulitis of right leg 08/16/2023   Healthcare maintenance 07/14/2023   Cellulitis of right lower extremity 02/12/2023   Eczematous dermatitis 02/06/2023   Skin ulcers of foot, bilateral (HCC), R> L, painful 02/05/2023   Hyperlipidemia 09/01/2022   Dysphagia 01/12/2021   Gastritis 01/12/2021   Screening for colon cancer 05/25/2020   Hypertension 03/27/2019   GERD with esophagitis 09/01/2016   Cigarette smoker 12/23/2014   Seasonal allergies 01/22/2013   Human immunodeficiency virus (HIV) disease (HCC) 07/10/2006   Alcohol use disorder, moderate, dependence (HCC) 07/10/2006

## 2023-09-02 NOTE — ED Notes (Signed)
 Collected Lab specimens for blood, 2 yellow, one and a half green, two lavender, one light green. I labeled each tube w/patient's name and included the paperwork, also put the two green tubes on Ice.

## 2023-09-02 NOTE — ED Notes (Signed)
 Pt has been observable in dayroom watching TV.  No observation of RIS.  Denied current SI plan and intent,  Pt is calm and pleasant Q 15 minute observations for safety continue

## 2023-09-02 NOTE — ED Notes (Signed)
 No reports or observations of withdrawal symptoms noted or reported. Pt is monitored by CIWA protocol scores have been 0. Q 15 minute observation for safety continue

## 2023-09-03 DIAGNOSIS — R768 Other specified abnormal immunological findings in serum: Secondary | ICD-10-CM | POA: Diagnosis present

## 2023-09-03 DIAGNOSIS — Z0279 Encounter for issue of other medical certificate: Secondary | ICD-10-CM | POA: Diagnosis not present

## 2023-09-03 DIAGNOSIS — F1414 Cocaine abuse with cocaine-induced mood disorder: Secondary | ICD-10-CM | POA: Diagnosis not present

## 2023-09-03 DIAGNOSIS — F1094 Alcohol use, unspecified with alcohol-induced mood disorder: Secondary | ICD-10-CM

## 2023-09-03 MED ORDER — VITAMIN D (ERGOCALCIFEROL) 1.25 MG (50000 UNIT) PO CAPS
50000.0000 [IU] | ORAL_CAPSULE | ORAL | Status: DC
  Administered 2023-09-03: 50000 [IU] via ORAL
  Filled 2023-09-03: qty 1

## 2023-09-03 MED ORDER — KETOCONAZOLE 2 % EX CREA
TOPICAL_CREAM | Freq: Every day | CUTANEOUS | Status: DC
  Administered 2023-09-03 – 2023-09-06 (×3): 1 via TOPICAL
  Filled 2023-09-03: qty 15

## 2023-09-03 NOTE — ED Notes (Signed)
 Pt was calm, cooperative this evening, spent time in the day room, watched TV, kept to himself majority of the time. He was cooperative on approach, ate HS snack and took his HS meds. Pt denied having any complaints, denied SI, HI, AVH. No withdrawal symptoms noted nor reported. Pt was encouraged to make his needs know to staff. Staff will continue to monitor pt for safety.

## 2023-09-03 NOTE — Group Note (Signed)
 Group Topic: Understanding Self  Group Date: 09/03/2023 Start Time: 0800 End Time: 0830 Facilitators: Lonzell Dwayne RAMAN, NT  Department: [facility]  Number of Participants: 3  Group Focus: self-awareness Treatment Modality:  Individual Therapy Interventions utilized were patient education Purpose: reinforce self-care  Name: William [name] Date of Birth: June 05, 1960  MR: [id number]    Level of Participation: Patient attended groupmoderate Quality of Participation: attentive Interactions with others: gave feedback Mood/Affect: positive Triggers (if applicable): N/A Cognition: coherent/clear Progress: Moderate Response: Appropriate Plan: patient will be encouraged to continue to be positive and see a doctor about his physical problems.  Patients Problems:  Patient Active Problem List   Diagnosis Date Noted   Hepatitis C antibody detected 09/03/2023   Cocaine dependence (HCC) 09/02/2023   Dilatation of aorta ~89mm  (HCC) 08/18/2023   MSSA bacteremia 08/18/2023   Staphylococcus aureus bacteremia 08/17/2023   Cellulitis of right leg 08/16/2023   Healthcare maintenance 07/14/2023   Cellulitis of right lower extremity 02/12/2023   Eczematous dermatitis 02/06/2023   Skin ulcers of foot, bilateral (HCC), R> L, painful 02/05/2023   Hyperlipidemia 09/01/2022   Dysphagia 01/12/2021   Gastritis 01/12/2021   Screening for colon cancer 05/25/2020   Hypertension 03/27/2019   GERD with esophagitis 09/01/2016   Cigarette smoker 12/23/2014   Seasonal allergies 01/22/2013   Human immunodeficiency virus (HIV) disease (HCC) 07/10/2006   Alcohol use disorder, moderate, dependence (HCC) 07/10/2006

## 2023-09-03 NOTE — ED Provider Notes (Signed)
 Behavioral Health Progress Note  Date and Time: 09/03/2023 11:55 AM Name: William [name] MRN:  [id number]  Subjective:  William [name] was seen today in the common area. He is sleeping and eating okay. Ambulation still a bit difficult with his foot wounds. He asks for skin cream for other areas of psoriasis. He is a little bit depressed. He has some of the AH but no VH today. No thoughts of harm to self or others.   Diagnosis:  Final diagnoses:  Alcohol-induced mood disorder (HCC)  Cocaine abuse with cocaine-induced mood disorder (HCC)  Alcohol dependence with uncomplicated withdrawal (HCC)    Total Time spent with patient: 20 minutes  Past Psychiatric History: previously diagnosed with alcohol use disorder. No previous suicide attempts.  Past Medical History: HIV, hypertension, history of hepatitis C, history of pneumothorax, GERD, hyperlipidemia and sepsis related to cellulitis of the lower extremities  Family History: adopted Social History: mother died recently. Patient living with brother. Using alcohol since teens, currently 2-4 40 ounce beers per day. Former smoker. Previously worked as a Arboriculturist.     Sleep: Good  Appetite:  Good  Current Medications:  Current Facility-Administered Medications  Medication Dose Route Frequency Provider Last Rate Last Admin   acetaminophen  (TYLENOL ) tablet 650 mg  650 mg Oral Q6H PRN Lera Golas B, DO       alum & mag hydroxide-simeth (MAALOX/MYLANTA) 200-200-20 MG/5ML suspension 30 mL  30 mL Oral Q4H PRN Lera Golas B, DO       bictegravir-emtricitabine -tenofovir  AF (BIKTARVY ) 50-200-25 MG per tablet 1 tablet  1 tablet Oral Daily Lera Golas B, DO   1 tablet at 09/03/23 9068   chlordiazePOXIDE  (LIBRIUM ) capsule 25 mg  25 mg Oral Q6H PRN Lera Golas B, DO       dicyclomine  (BENTYL ) tablet 20 mg  20 mg Oral Q6H PRN Lera Golas B, DO       haloperidol  lactate (HALDOL ) injection 5 mg  5 mg Intramuscular TID PRN Prunty, Donald B, DO        And   diphenhydrAMINE  (BENADRYL ) injection 50 mg  50 mg Intramuscular TID PRN Lera Golas B, DO       And   LORazepam  (ATIVAN ) injection 2 mg  2 mg Intramuscular TID PRN Lera Golas B, DO       hydrochlorothiazide  (HYDRODIURIL ) tablet 25 mg  25 mg Oral Daily Prunty, Donald B, DO   25 mg at 09/03/23 9068   hydrOXYzine  (ATARAX ) tablet 25 mg  25 mg Oral Q6H PRN Prunty, Donald B, DO       ketoconazole  (NIZORAL ) 2 % cream   Topical Daily Jafar Poffenberger, Corean Massa, MD       loperamide  (IMODIUM ) capsule 2-4 mg  2-4 mg Oral PRN Lera Golas B, DO       magnesium  hydroxide (MILK OF MAGNESIA) suspension 30 mL  30 mL Oral Daily PRN Lera Golas B, DO       methocarbamol  (ROBAXIN ) tablet 500 mg  500 mg Oral Q8H PRN Prunty, Donald B, DO       mirtazapine  (REMERON ) tablet 7.5 mg  7.5 mg Oral QHS Bijon Mineer, Corean Massa, MD   7.5 mg at 09/02/23 2128   OLANZapine  (ZYPREXA ) tablet 5 mg  5 mg Oral QHS Savana Spina, Corean Massa, MD   5 mg at 09/02/23 2129   OLANZapine  zydis (ZYPREXA ) disintegrating tablet 5 mg  5 mg Oral TID PRN Lera Golas B, DO       ondansetron  (ZOFRAN -ODT) disintegrating tablet  4 mg  4 mg Oral Q6H PRN Lera Golas B, DO       pantoprazole  (PROTONIX ) EC tablet 40 mg  40 mg Oral Daily Davine Sweney, Corean Massa, MD   40 mg at 09/03/23 9068   traZODone  (DESYREL ) tablet 50 mg  50 mg Oral QHS PRN Lera Golas NOVAK, DO       Vitamin D  (Ergocalciferol ) (DRISDOL ) 1.25 MG (50000 UNIT) capsule 50,000 Units  50,000 Units Oral Q7 days Tobechukwu Emmick, Corean Massa, MD       Current Outpatient Medications  Medication Sig Dispense Refill   bictegravir-emtricitabine -tenofovir  AF (BIKTARVY ) 50-200-25 MG TABS tablet Take 1 tablet by mouth daily. 30 tablet 11   hydrochlorothiazide  (HYDRODIURIL ) 25 MG tablet Take 1 tablet (25 mg total) by mouth daily. 90 tablet 3   rosuvastatin  (CRESTOR ) 10 MG tablet Take 1 tablet (10 mg total) by mouth daily. 90 tablet 4   triamcinolone  ointment (KENALOG ) 0.5 % Apply topically 3  (three) times daily. 30 g 0    Labs  Lab Results:  Admission on 09/01/2023  Component Date Value Ref Range Status   SARS Coronavirus 2 by RT PCR 09/02/2023 NEGATIVE  NEGATIVE Final   Performed at [facility] Lab, [address] [place], [place] [address]   Magnesium  09/02/2023 1.9  1.7 - 2.4 mg/dL Final   Performed at [facility] Lab, [address], [place], [place] [address]   Ammonia 09/02/2023 18  9 - 35 umol/L Final   Performed at [facility] Lab, [address], [place], [place] [address]   Hgb A1c MFr Bld 09/02/2023 5.5  4.8 - 5.6 % Final   Comment: (NOTE) Diagnosis of Diabetes The following HbA1c ranges recommended by the American Diabetes Association (ADA) may be used as an aid in the diagnosis of diabetes mellitus.  Hemoglobin             Suggested A1C NGSP%              Diagnosis  <5.7                   Non Diabetic  5.7-6.4                Pre-Diabetic  >6.4                   Diabetic  <7.0                   Glycemic control for                       adults with diabetes.     Mean Plasma Glucose 09/02/2023 111.15  mg/dL Final   Performed at [facility] Lab, [address], [place], [place] [address]   Vit D, 25-Hydroxy 09/02/2023 16.73 (L)  30 - 100 ng/mL Final   Comment: (NOTE) Vitamin D  deficiency has been defined by the Institute of Medicine  and an Endocrine Society practice guideline as a level of serum 25-OH  vitamin D  less than 20 ng/mL (1,2). The Endocrine Society went on to  further define vitamin D  insufficiency as a level between 21 and 29  ng/mL (2).  1. IOM (Institute of Medicine). 2010. Dietary reference intakes for  calcium  and D. [place]: [employer]. 2. Holick MF, Binkley Marquez, Bischoff-Ferrari HA, et al. Evaluation,  treatment, and prevention of vitamin D  deficiency: an Endocrine  Society clinical practice guideline, JCEM. 2011  Jul; 96(7): 1911-30.  Performed at [facility] Lab, [address]  [place], [place] [address]    Vitamin B-12 09/02/2023 292  180 - 914 pg/mL Final   Comment: (NOTE) This assay is not validated for testing neonatal or myeloproliferative syndrome specimens for Vitamin B12 levels. Performed at [facility] Lab, [address] [place], [place] [address]    Hepatitis B Surface Ag 09/02/2023 NON REACTIVE  NON REACTIVE Final   HCV Ab 09/02/2023 Reactive (A)  NON REACTIVE Final   Comment: (NOTE) The CDC recommends that a Reactive HCV antibody result be followed up  with a HCV Nucleic Acid Amplification test.     Hep A IgM 09/02/2023 NON REACTIVE  NON REACTIVE Final   Hep B C IgM 09/02/2023 NON REACTIVE  NON REACTIVE Final   Performed at [facility] Lab, [address] [place], [place] [address]  Admission on 08/31/2023, Discharged on 08/31/2023  Component Date Value Ref Range Status   Sodium 08/31/2023 136  135 - 145 mmol/L Final   Potassium 08/31/2023 3.6  3.5 - 5.1 mmol/L Final   Chloride 08/31/2023 104  98 - 111 mmol/L Final   CO2 08/31/2023 25  22 - 32 mmol/L Final   Glucose, Bld 08/31/2023 88  70 - 99 mg/dL Final   Glucose reference range applies only to samples taken after fasting for at least 8 hours.   BUN 08/31/2023 13  8 - 23 mg/dL Final   Creatinine, Ser 08/31/2023 0.75  0.61 - 1.24 mg/dL Final   Calcium  08/31/2023 9.0  8.9 - 10.3 mg/dL Final   Total Protein 92/75/7974 7.9  6.5 - 8.1 g/dL Final   Albumin 92/75/7974 4.0  3.5 - 5.0 g/dL Final   AST 92/75/7974 26  15 - 41 U/L Final   ALT 08/31/2023 20  0 - 44 U/L Final   Alkaline Phosphatase 08/31/2023 64  38 - 126 U/L Final   Total Bilirubin 08/31/2023 0.7  0.0 - 1.2 mg/dL Final   GFR, Estimated 08/31/2023 >60  >60 mL/min Final   Comment: (NOTE) Calculated using the CKD-EPI Creatinine Equation (2021)    Anion gap 08/31/2023 7  5 - 15 Final   Performed at [facility] Lab, [address] [place], [place] [address]   WBC 08/31/2023 7.8  4.0 - 10.5 K/uL Final   RBC 08/31/2023 4.55   4.22 - 5.81 MIL/uL Final   Hemoglobin 08/31/2023 13.0  13.0 - 17.0 g/dL Final   HCT 92/75/7974 39.7  39.0 - 52.0 % Final   MCV 08/31/2023 87.3  80.0 - 100.0 fL Final   MCH 08/31/2023 28.6  26.0 - 34.0 pg Final   MCHC 08/31/2023 32.7  30.0 - 36.0 g/dL Final   RDW 92/75/7974 13.9  11.5 - 15.5 % Final   Platelets 08/31/2023 257  150 - 400 K/uL Final   nRBC 08/31/2023 0.0  0.0 - 0.2 % Final   Neutrophils Relative % 08/31/2023 52  % Final   Neutro Abs 08/31/2023 4.0  1.7 - 7.7 K/uL Final   Lymphocytes Relative 08/31/2023 33  % Final   Lymphs Abs 08/31/2023 2.6  0.7 - 4.0 K/uL Final   Monocytes Relative 08/31/2023 9  % Final   Monocytes Absolute 08/31/2023 0.7  0.1 - 1.0 K/uL Final   Eosinophils Relative 08/31/2023 5  % Final   Eosinophils Absolute 08/31/2023 0.4  0.0 - 0.5 K/uL Final   Basophils Relative 08/31/2023 1  % Final  Basophils Absolute 08/31/2023 0.1  0.0 - 0.1 K/uL Final   Immature Granulocytes 08/31/2023 0  % Final   Abs Immature Granulocytes 08/31/2023 0.01  0.00 - 0.07 K/uL Final   Performed at [facility] Lab, [address], [place], [place] [address]   Acetaminophen  (Tylenol ), Serum 08/31/2023 <10 (L)  10 - 30 ug/mL Final   Comment: (NOTE) Therapeutic concentrations vary significantly. A range of 10-30 ug/mL  may be an effective concentration for many patients. However, some  are best treated at concentrations outside of this range. Acetaminophen  concentrations >150 ug/mL at 4 hours after ingestion  and >50 ug/mL at 12 hours after ingestion are often associated with  toxic reactions.  Performed at [facility] Lab, [address], [place], [place] [address]    Alcohol, Ethyl (B) 08/31/2023 <15  <15 mg/dL Final   Comment: (NOTE) For medical purposes only. Performed at [facility] Lab, [address] [place], [place] [address]    Salicylate Lvl 08/31/2023 <7.0 (L)  7.0 - 30.0 mg/dL Final   Performed at [facility] Lab, [address] [place],  [place] [address]   Opiates 08/31/2023 NONE DETECTED  NONE DETECTED Final   Cocaine 08/31/2023 POSITIVE (A)  NONE DETECTED Final   Benzodiazepines 08/31/2023 NONE DETECTED  NONE DETECTED Final   Amphetamines 08/31/2023 NONE DETECTED  NONE DETECTED Final   Tetrahydrocannabinol 08/31/2023 NONE DETECTED  NONE DETECTED Final   Barbiturates 08/31/2023 NONE DETECTED  NONE DETECTED Final   Comment: (NOTE) DRUG SCREEN FOR MEDICAL PURPOSES ONLY.  IF CONFIRMATION IS NEEDED FOR ANY PURPOSE, NOTIFY LAB WITHIN 5 DAYS.  LOWEST DETECTABLE LIMITS FOR URINE DRUG SCREEN Drug Class                     Cutoff (ng/mL) Amphetamine and metabolites    1000 Barbiturate and metabolites    200 Benzodiazepine                 200 Opiates and metabolites        300 Cocaine and metabolites        300 THC                            50 Performed at [facility] Lab, [address] [place], [place] [address]    Specimen Source 08/31/2023 URINE, CLEAN CATCH   Final   Color, Urine 08/31/2023 YELLOW  YELLOW Final   APPearance 08/31/2023 CLEAR  CLEAR Final   Specific Gravity, Urine 08/31/2023 1.024  1.005 - 1.030 Final   pH 08/31/2023 5.0  5.0 - 8.0 Final   Glucose, UA 08/31/2023 NEGATIVE  NEGATIVE mg/dL Final   Hgb urine dipstick 08/31/2023 NEGATIVE  NEGATIVE Final   Bilirubin Urine 08/31/2023 NEGATIVE  NEGATIVE Final   Ketones, ur 08/31/2023 NEGATIVE  NEGATIVE mg/dL Final   Protein, ur 92/75/7974 NEGATIVE  NEGATIVE mg/dL Final   Nitrite 92/75/7974 NEGATIVE  NEGATIVE Final   Leukocytes,Ua 08/31/2023 NEGATIVE  NEGATIVE Final   RBC / HPF 08/31/2023 0-5  0 - 5 RBC/hpf Final   WBC, UA 08/31/2023 0-5  0 - 5 WBC/hpf Final   Comment:        Reflex urine culture not performed if WBC <=10, OR if Squamous epithelial cells >5. If Squamous epithelial cells >5 suggest recollection.    Bacteria, UA 08/31/2023 NONE SEEN  NONE SEEN Final   Squamous Epithelial / HPF 08/31/2023 0-5  0 - 5 /HPF Final   Mucus 08/31/2023 PRESENT    Final   Performed at [facility] Lab, [address] [place], [place] [address]  Office Visit on 08/29/2023  Component Date Value Ref Range Status   WBC 08/29/2023 7.1  3.4 - 10.8 x10E3/uL Final   RBC 08/29/2023 4.78  4.14 - 5.80 x10E6/uL Final   Hemoglobin 08/29/2023 13.2  13.0 - 17.7 g/dL Final   Hematocrit 92/77/7974 42.6  37.5 - 51.0 % Final   MCV 08/29/2023 89  79 - 97 fL Final   MCH 08/29/2023 27.6  26.6 - 33.0 pg Final   MCHC 08/29/2023 31.0 (L)  31.5 - 35.7 g/dL Final   RDW 92/77/7974 13.3  11.6 - 15.4 % Final   Platelets 08/29/2023 251  150 - 450 x10E3/uL Final   Neutrophils 08/29/2023 54  Not Estab. % Final   Lymphs 08/29/2023 30  Not Estab. % Final   Monocytes 08/29/2023 11  Not Estab. % Final   Eos 08/29/2023 4  Not Estab. % Final   Basos 08/29/2023 1  Not Estab. % Final   Neutrophils Absolute 08/29/2023 3.8  1.4 - 7.0 x10E3/uL Final   Lymphocytes Absolute 08/29/2023 2.1  0.7 - 3.1 x10E3/uL Final   Monocytes Absolute 08/29/2023 0.8  0.1 - 0.9 x10E3/uL Final   EOS (ABSOLUTE) 08/29/2023 0.3  0.0 - 0.4 x10E3/uL Final   Basophils Absolute 08/29/2023 0.1  0.0 - 0.2 x10E3/uL Final   Immature Granulocytes 08/29/2023 0  Not Estab. % Final   Immature Grans (Abs) 08/29/2023 0.0  0.0 - 0.1 x10E3/uL Final   CRP 08/29/2023 4  0 - 10 mg/L Final   Sed Rate 08/29/2023 35 (H)  0 - 30 mm/hr Final   Glucose 08/29/2023 92  70 - 99 mg/dL Final   BUN 92/77/7974 14  8 - 27 mg/dL Final   Creatinine, Ser 08/29/2023 0.88  0.76 - 1.27 mg/dL Final   eGFR 92/77/7974 98  >59 mL/min/1.73 Final   BUN/Creatinine Ratio 08/29/2023 16  10 - 24 Final   Sodium 08/29/2023 140  134 - 144 mmol/L Final   Potassium 08/29/2023 4.1  3.5 - 5.2 mmol/L Final   Chloride 08/29/2023 102  96 - 106 mmol/L Final   CO2 08/29/2023 20  20 - 29 mmol/L Final   Calcium  08/29/2023 9.2  8.6 - 10.2 mg/dL Final  No results displayed because visit has over 200 results.    Office Visit on 08/10/2023  Component Date Value Ref  Range Status   HIV 1 RNA Quant 08/10/2023 2,420 (H)  NOT DETECTED copies/mL Final   HIV-1 RNA Quant, Log 08/10/2023 3.38 (H)  NOT DETECTED Log copies/mL Final   Comment: . This test was performed using Real-Time Polymerase Chain Reaction. . Reportable Range: 20 copies/mL to 10,000,000 copies/mL (1.30 log copies/mL to 7.00 log copies/mL).    WBC 08/10/2023 5.6  3.8 - 10.8 Thousand/uL Final   RBC 08/10/2023 4.98  4.20 - 5.80 Million/uL Final   Hemoglobin 08/10/2023 13.9  13.2 - 17.1 g/dL Final   HCT 92/96/7974 43.7  38.5 - 50.0 % Final   MCV 08/10/2023 87.8  80.0 - 100.0 fL Final   MCH 08/10/2023 27.9  27.0 - 33.0 pg Final   MCHC 08/10/2023 31.8 (L)  32.0 - 36.0 g/dL Final   Comment: For adults, a slight decrease in the calculated MCHC value (in the range of 30 to 32 g/dL) is most likely not clinically significant; however, it should be  interpreted with caution in correlation with other red cell parameters and the patient's clinical condition.    RDW 08/10/2023 13.2  11.0 - 15.0 % Final   Platelets 08/10/2023 313  140 - 400 Thousand/uL Final   MPV 08/10/2023 9.7  7.5 - 12.5 fL Final   Glucose, Bld 08/10/2023 96  65 - 99 mg/dL Final   Comment: .            Fasting reference interval .    BUN 08/10/2023 11  7 - 25 mg/dL Final   Creat 92/96/7974 0.77  0.70 - 1.35 mg/dL Final   BUN/Creatinine Ratio 08/10/2023 SEE NOTE:  6 - 22 (calc) Final   Comment:    Not Reported: BUN and Creatinine are within    reference range. .    Sodium 08/10/2023 140  135 - 146 mmol/L Final   Potassium 08/10/2023 3.6  3.5 - 5.3 mmol/L Final   Chloride 08/10/2023 104  98 - 110 mmol/L Final   CO2 08/10/2023 27  20 - 32 mmol/L Final   Calcium  08/10/2023 9.2  8.6 - 10.3 mg/dL Final   Total Protein 92/96/7974 7.4  6.1 - 8.1 g/dL Final   Albumin 92/96/7974 4.3  3.6 - 5.1 g/dL Final   Globulin 92/96/7974 3.1  1.9 - 3.7 g/dL (calc) Final   AG Ratio 08/10/2023 1.4  1.0 - 2.5 (calc) Final   Total Bilirubin  08/10/2023 0.3  0.2 - 1.2 mg/dL Final   Alkaline phosphatase (APISO) 08/10/2023 75  35 - 144 U/L Final   AST 08/10/2023 29  10 - 35 U/L Final   ALT 08/10/2023 27  9 - 46 U/L Final    Blood Alcohol level:  Lab Results  Component Value Date   ETH <15 08/31/2023   ETH <10 11/04/2016    Metabolic Disorder Labs: Lab Results  Component Value Date   HGBA1C 5.5 09/02/2023   MPG 111.15 09/02/2023   No results found for: PROLACTIN Lab Results  Component Value Date   CHOL 180 08/16/2023   TRIG 63 08/16/2023   HDL 45 08/16/2023   CHOLHDL 4.0 08/16/2023   VLDL 13 08/16/2023   LDLCALC 122 (H) 08/16/2023   LDLCALC 128 (H) 05/11/2022    Therapeutic Lab Levels: No results found for: LITHIUM No results found for: VALPROATE No results found for: CBMZ  Physical Findings   PHQ2-9    Flowsheet Row Office Visit from 08/29/2023 in [facility] - A Dept Of [place]. [facility] Office Visit from 11/10/2022 in [facility] - A Dept Of [place]. [facility] Office Visit from 10/26/2022 in [facility] - A Dept Of [place]. [facility] Office Visit from 09/01/2022 in [facility] - A Dept Of [place]. [facility] Office Visit from 05/11/2022 in [facility] Infect Dis - A Dept Of [place]. [facility]  PHQ-2 Total Score 0 0 0 0 0  PHQ-9 Total Score -- -- 0 -- --   Flowsheet Row ED from 09/01/2023 in [facility] Most recent reading at 09/03/2023 10:43 AM ED from 08/31/2023 in [facility] Most recent reading at 09/01/2023  1:16 AM ED from 08/31/2023 in [facility] Emergency Department at [facility] Most recent reading at 08/31/2023  6:33 PM  C-SSRS RISK CATEGORY No Risk Moderate Risk High Risk     Musculoskeletal  Strength & Muscle Tone: within normal limits Gait & Station: unsteady Patient leans:  N/A  Psychiatric Specialty Exam  Presentation  General Appearance:  Casual  Eye Contact: Fair  Speech: Normal Rate  Speech Volume: Normal  Handedness: Right   Mood and Affect  Mood: Depressed  Affect: Congruent   Thought Process  Thought Processes: Linear  Descriptions of Associations:Intact  Orientation:Full (Time, Place and Person)  Thought Content:Logical  Diagnosis of Schizophrenia or Schizoaffective disorder in past: No    Hallucinations:Hallucinations: Auditory Description of Auditory Hallucinations: different things  Ideas of Reference:None  Suicidal Thoughts:Suicidal Thoughts: No  Homicidal Thoughts:Homicidal Thoughts: No   Sensorium  Memory: Immediate Fair; Recent Fair; Remote Fair  Judgment: Fair  Insight: Fair   Art therapist  Concentration: Fair  Attention Span: Fair  Recall: Fair  Fund of Knowledge: Fair  Language: Fair   Psychomotor Activity  Psychomotor Activity: Psychomotor Activity: Normal   Assets  Assets: Desire for Improvement; Leisure Time   Sleep  Sleep: Sleep: Good  No Safety Checks orders active in given range  Nutritional Assessment (For OBS and FBC admissions only) Has the patient had a weight loss or gain of 10 pounds or more in the last 3 months?: No Has the patient had a decrease in food intake/or appetite?: No Does the patient have dental problems?: Yes Does the patient have eating habits or behaviors that may be indicators of an eating disorder including binging or inducing vomiting?: No Has the patient recently lost weight without trying?: 0 Has the patient been eating poorly because of a decreased appetite?: 0 Malnutrition Screening Tool Score: 0    Physical Exam  Physical Exam Vitals and nursing note reviewed.  HENT:     Head: Normocephalic.  Eyes:     Extraocular Movements: Extraocular movements intact.  Pulmonary:     Effort: Pulmonary effort is normal.   Musculoskeletal:        General: Normal range of motion.     Cervical back: Normal range of motion.  Neurological:     General: No focal deficit present.     Mental Status: He is alert and oriented to person, place, and time.  Psychiatric:        Mood and Affect: Mood is depressed.        Behavior: Behavior normal.    Review of Systems  Constitutional:  Negative for chills and fever.  Gastrointestinal:  Negative for constipation, diarrhea, nausea and vomiting.  Genitourinary:  Negative for dysuria.  Musculoskeletal:  Positive for joint pain and myalgias.  Skin:  Positive for itching and rash.  Neurological:  Negative for headaches.  Psychiatric/Behavioral:  Positive for depression and hallucinations.    Blood pressure 108/80, pulse 79, temperature 97.8 F (36.6 C), temperature source Oral, resp. rate 17, SpO2 94%. There is no height or weight on file to calculate BMI.  Treatment Plan Summary: Long Term Goals: Improvement in symptoms so as ready for discharge   Short Term Goals: Patient will verbalize feelings in meetings with treatment team members., Patient will attend at least of 50% of the groups daily., Pt will complete the PHQ9 on admission, day 3 and discharge., and Patient will take medications as prescribed daily.   Medications: Mood/anxiety: continue group therapy, milieu therapy, 1:1 evaluation with provider.  Medication management: olanzapine  5mg  PO at bedtime for hallucinations, remeron  7.5 mg PO at bedtime for mood, anxiety and sleep  Substance Abuse:  stimulant use: encourage patient to maintain PO intake. Coverage PRN for  secondary psychosis.  Alcohol use disorder: Replacing Thiamine . CIWA monitoring with benzodiazepine coverage per withdrawal scoring. Monitoring HR and BP.  Nicotine  and tobacco use: N/A Medical: PRNs for pain, constipation, indigestion available.  Labs/studies: vitamin D  low, starting ergocalciferol  50 000 units PO weekly.  HCV +, RNA pending.   Safety and Monitoring: voluntarily admission to [facility]/Facility based care unit [facility]) unit for safety, stabilization and treatment Daily contact with patient to assess and evaluate symptoms and progress in treatment Patient's case to be discussed in multi-disciplinary team meeting Observation Level : q15 minute checks Vital signs: q12 hours Precautions: withdrawal and fall   Based on my evaluation the patient does not appear to have an emergency medical condition.   Corean Anette Potters, MD 09/03/2023 11:55 AM

## 2023-09-03 NOTE — ED Notes (Signed)
 Patient is in the dayroom composed  watching TV with other patients. NAD.  Environment secured per policy. Will monitor patient for safety.

## 2023-09-03 NOTE — ED Notes (Signed)
 CIWA not done at this time due to pt sleeping. No distress noted. Respirations even and unlabored. Staff will continue to monitor for safety.

## 2023-09-03 NOTE — ED Notes (Signed)
 Pt awake and alert.  Denies SI, HI or AVH.  Flat affect and depressed mood.  Pt answers questions appropriately and is  maintaining appropriate boundaries.   Ate breakfast this morning and took medications without incident.  Pt was informed that his dsg change is for every Monday.  Pt was offered assistance and covering for his leg so he can take a shower. Pt reported that  I will just wash up and wait until tomorrow for the dressing change.  No distress noted. Q checks for safety in place.

## 2023-09-03 NOTE — Group Note (Signed)
 Group Topic: Relaxation  Group Date: 09/03/2023 Start Time: 1300 End Time: 1330 Facilitators: Belle Camellia SAILOR, RN  Department: [facility]  Number of Participants: 2  Group Focus: relaxation Treatment Modality:  Patient-Centered Therapy Interventions utilized were patient education and support Purpose: reinforce self-care  Name: William [name] Date of Birth: 1960/12/05  MR: [id number]     Level of Participation: Pt did not attended group  Quality of Participation: n/a Interactions with others: n/a Mood/Affect: n/a Triggers (if applicable): n/a Cognition: n/a Progress: n/a Response: n/a Plan: patient will be encouraged to continue with treatment plan   Patients Problems:  Patient Active Problem List   Diagnosis Date Noted   Hepatitis C antibody detected 09/03/2023   Cocaine dependence (HCC) 09/02/2023   Dilatation of aorta ~64mm  (HCC) 08/18/2023   MSSA bacteremia 08/18/2023   Staphylococcus aureus bacteremia 08/17/2023   Cellulitis of right leg 08/16/2023   Healthcare maintenance 07/14/2023   Cellulitis of right lower extremity 02/12/2023   Eczematous dermatitis 02/06/2023   Skin ulcers of foot, bilateral (HCC), R> L, painful 02/05/2023   Hyperlipidemia 09/01/2022   Dysphagia 01/12/2021   Gastritis 01/12/2021   Screening for colon cancer 05/25/2020   Hypertension 03/27/2019   GERD with esophagitis 09/01/2016   Cigarette smoker 12/23/2014   Seasonal allergies 01/22/2013   Human immunodeficiency virus (HIV) disease (HCC) 07/10/2006   Alcohol use disorder, moderate, dependence (HCC) 07/10/2006

## 2023-09-04 ENCOUNTER — Encounter: Payer: Self-pay | Admitting: Pharmacist

## 2023-09-04 DIAGNOSIS — Z0279 Encounter for issue of other medical certificate: Secondary | ICD-10-CM | POA: Diagnosis not present

## 2023-09-04 MED ORDER — MIRTAZAPINE 7.5 MG PO TABS: 7.5000 mg | ORAL_TABLET | Freq: Every evening | ORAL | Status: DC | PRN

## 2023-09-04 MED ORDER — OLANZAPINE 7.5 MG PO TABS
7.5000 mg | ORAL_TABLET | Freq: Every day | ORAL | Status: DC
  Administered 2023-09-04 – 2023-09-05 (×2): 7.5 mg via ORAL
  Filled 2023-09-04 (×2): qty 1

## 2023-09-04 NOTE — Group Note (Signed)
 Group Topic: Sleep Hygiene  Group Date: 09/04/2023 Start Time: 0300 End Time: 0400 Facilitators: [place] Stephane HERO, [place]  Department: [facility]  Number of Participants: 4  Group Focus: activities of daily living skills, chemical dependency issues, and daily focus Treatment Modality:  Psychoeducation Interventions utilized were exploration, mental fitness, and patient education Purpose: increase insight and reinforce self-care  Name: William [name] Date of Birth: June 01, 1960  MR: [id number]    Level of Participation: active Quality of Participation: attentive, cooperative, drowsy, engaged, and offered feedback Interactions with others: gave feedback Mood/Affect: appropriate Triggers (if applicable): n/a Cognition: coherent/clear, goal directed, insightful, and logical Progress: Significant Response: gave appropriate feedback Plan: referral / recommendations  Patients Problems:  Patient Active Problem List   Diagnosis Date Noted   Hepatitis C antibody detected 09/03/2023   Cocaine dependence (HCC) 09/02/2023   Dilatation of aorta ~31mm  (HCC) 08/18/2023   MSSA bacteremia 08/18/2023   Staphylococcus aureus bacteremia 08/17/2023   Cellulitis of right leg 08/16/2023   Healthcare maintenance 07/14/2023   Cellulitis of right lower extremity 02/12/2023   Eczematous dermatitis 02/06/2023   Skin ulcers of foot, bilateral (HCC), R> L, painful 02/05/2023   Hyperlipidemia 09/01/2022   Dysphagia 01/12/2021   Gastritis 01/12/2021   Screening for colon cancer 05/25/2020   Hypertension 03/27/2019   GERD with esophagitis 09/01/2016   Cigarette smoker 12/23/2014   Seasonal allergies 01/22/2013   Human immunodeficiency virus (HIV) disease (HCC) 07/10/2006   Alcohol use disorder, moderate, dependence (HCC) 07/10/2006

## 2023-09-04 NOTE — Group Note (Signed)
 Group Topic: Recovery Basics  Group Date: 09/04/2023 Start Time: 2000 End Time: 2030 Facilitators: Joan Plowman B  Department: [facility]  Number of Participants: 5  Group Focus: abuse issues, chemical dependency issues, concentration, coping skills, daily focus, goals/reality orientation, personal responsibility, relapse prevention, and social skills Treatment Modality:  Individual Therapy Interventions utilized were leisure development, patient education, story telling, and support Purpose: enhance coping skills, express feelings, express irrational fears, increase insight, relapse prevention strategies, and trigger / craving management  Name: William [name] Date of Birth: 1960/08/24  MR: [id number]    Level of Participation: PT DID NOT ATTEND GROUP Quality of Participation: attentive and cooperative Interactions with others: gave feedback Mood/Affect: appropriate Triggers (if applicable): NA Cognition: coherent/clear Progress: None Response: NA Plan: patient will be encouraged to go to group  Patients Problems:  Patient Active Problem List   Diagnosis Date Noted   Hepatitis C antibody detected 09/03/2023   Cocaine dependence (HCC) 09/02/2023   Dilatation of aorta ~82mm  (HCC) 08/18/2023   MSSA bacteremia 08/18/2023   Staphylococcus aureus bacteremia 08/17/2023   Cellulitis of right leg 08/16/2023   Healthcare maintenance 07/14/2023   Cellulitis of right lower extremity 02/12/2023   Eczematous dermatitis 02/06/2023   Skin ulcers of foot, bilateral (HCC), R> L, painful 02/05/2023   Hyperlipidemia 09/01/2022   Dysphagia 01/12/2021   Gastritis 01/12/2021   Screening for colon cancer 05/25/2020   Hypertension 03/27/2019   GERD with esophagitis 09/01/2016   Cigarette smoker 12/23/2014   Seasonal allergies 01/22/2013   Human immunodeficiency virus (HIV) disease (HCC) 07/10/2006   Alcohol use disorder, moderate, dependence (HCC) 07/10/2006

## 2023-09-04 NOTE — ED Notes (Signed)
 Patient asleep in the bedroom.NAD. Will continue to monitor for safety

## 2023-09-04 NOTE — ED Provider Notes (Addendum)
 Behavioral Health Progress Note  Date and Time: 09/04/2023 7:07 PM Name: William [name] MRN:  [id number]  Subjective:  [name] was seen today in the common area. He is sleeping and eating well and denies daytime sedation however was seen repeatedly Ambulation still a bit difficult with his foot wound. Reviewed recent outpatient documentation and patient is supposed to followup with ID on outpatient basis however was not recommended to take any further antibiotics after followup on 7/24. Will need to followup with ID for consultation. Denies SI or HI. Patient reports residual AH of voices but cannot make out what they're saying. Agreeable to Zyprexa  increase Diagnosis:  Final diagnoses:  Alcohol-induced mood disorder (HCC)  Cocaine abuse with cocaine-induced mood disorder (HCC)  Alcohol dependence with uncomplicated withdrawal (HCC)    Total Time spent with patient: 20 minutes  Past Psychiatric History: previously diagnosed with alcohol use disorder. No previous suicide attempts.  Past Medical History: HIV, hypertension, history of hepatitis C, history of pneumothorax, GERD, hyperlipidemia and sepsis related to cellulitis of the lower extremities  Family History: adopted Social History: mother died recently. Patient living with brother. Using alcohol since teens, currently 2-4 40 ounce beers per day. Former smoker. Previously worked as a Arboriculturist.     Sleep: Good  Appetite:  Good  Current Medications:  Current Facility-Administered Medications  Medication Dose Route Frequency Provider Last Rate Last Admin   acetaminophen  (TYLENOL ) tablet 650 mg  650 mg Oral Q6H PRN Lera Golas B, DO       alum & mag hydroxide-simeth (MAALOX/MYLANTA) 200-200-20 MG/5ML suspension 30 mL  30 mL Oral Q4H PRN Lera Golas B, DO       bictegravir-emtricitabine -tenofovir  AF (BIKTARVY ) 50-200-25 MG per tablet 1 tablet  1 tablet Oral Daily Prunty, Donald B, DO   1 tablet at 09/04/23 9068   dicyclomine   (BENTYL ) tablet 20 mg  20 mg Oral Q6H PRN Lera Golas B, DO       haloperidol  lactate (HALDOL ) injection 5 mg  5 mg Intramuscular TID PRN Prunty, Donald B, DO       And   diphenhydrAMINE  (BENADRYL ) injection 50 mg  50 mg Intramuscular TID PRN Lera Golas B, DO       And   LORazepam  (ATIVAN ) injection 2 mg  2 mg Intramuscular TID PRN Lera Golas B, DO       hydrochlorothiazide  (HYDRODIURIL ) tablet 25 mg  25 mg Oral Daily Prunty, Donald B, DO   25 mg at 09/04/23 9068   ketoconazole  (NIZORAL ) 2 % cream   Topical Daily Leigh Corean Massa, MD   1 Application at 09/04/23 0930   loperamide  (IMODIUM ) capsule 2-4 mg  2-4 mg Oral PRN Lera Golas B, DO       magnesium  hydroxide (MILK OF MAGNESIA) suspension 30 mL  30 mL Oral Daily PRN Lera Golas B, DO       methocarbamol  (ROBAXIN ) tablet 500 mg  500 mg Oral Q8H PRN Prunty, Donald B, DO       mirtazapine  (REMERON ) tablet 7.5 mg  7.5 mg Oral QHS PRN Bertin Inabinet, MD       OLANZapine  (ZYPREXA ) tablet 5 mg  5 mg Oral QHS Hill, Corean Massa, MD   5 mg at 09/03/23 2121   OLANZapine  zydis (ZYPREXA ) disintegrating tablet 5 mg  5 mg Oral TID PRN Lera Golas B, DO       ondansetron  (ZOFRAN -ODT) disintegrating tablet 4 mg  4 mg Oral Q6H PRN Lera Golas NOVAK, DO  pantoprazole  (PROTONIX ) EC tablet 40 mg  40 mg Oral Daily Hill, Corean Massa, MD   40 mg at 09/04/23 9068   traZODone  (DESYREL ) tablet 50 mg  50 mg Oral QHS PRN Lera Nancyann NOVAK, DO       Vitamin D  (Ergocalciferol ) (DRISDOL ) 1.25 MG (50000 UNIT) capsule 50,000 Units  50,000 Units Oral Q7 days Leigh Corean Massa, MD   50,000 Units at 09/03/23 1359   Current Outpatient Medications  Medication Sig Dispense Refill   bictegravir-emtricitabine -tenofovir  AF (BIKTARVY ) 50-200-25 MG TABS tablet Take 1 tablet by mouth daily. 30 tablet 11   hydrochlorothiazide  (HYDRODIURIL ) 25 MG tablet Take 1 tablet (25 mg total) by mouth daily. 90 tablet 3   rosuvastatin  (CRESTOR ) 10 MG tablet  Take 1 tablet (10 mg total) by mouth daily. 90 tablet 4   triamcinolone  ointment (KENALOG ) 0.5 % Apply topically 3 (three) times daily. 30 g 0    Labs  Lab Results:  Admission on 09/01/2023  Component Date Value Ref Range Status   SARS Coronavirus 2 by RT PCR 09/02/2023 NEGATIVE  NEGATIVE Final   Performed at [facility] Lab, [address] [place], [place] [address]   Magnesium  09/02/2023 1.9  1.7 - 2.4 mg/dL Final   Performed at [facility], [address], [place], [place] [address]   Ammonia 09/02/2023 18  9 - 35 umol/L Final   Performed at [facility], [address], [place], [place] [address]   Hgb A1c MFr Bld 09/02/2023 5.5  4.8 - 5.6 % Final   Comment: (NOTE) Diagnosis of Diabetes The following HbA1c ranges recommended by the American Diabetes Association (ADA) may be used as an aid in the diagnosis of diabetes mellitus.  Hemoglobin             Suggested A1C NGSP%              Diagnosis  <5.7                   Non Diabetic  5.7-6.4                Pre-Diabetic  >6.4                   Diabetic  <7.0                   Glycemic control for                       adults with diabetes.     Mean Plasma Glucose 09/02/2023 111.15  mg/dL Final   Performed at [facility], [address], [place], [place] [address]   Vit D, 25-Hydroxy 09/02/2023 16.73 (L)  30 - 100 ng/mL Final   Comment: (NOTE) Vitamin D  deficiency has been defined by the Institute of Medicine  and an Endocrine Society practice guideline as a level of serum 25-OH  vitamin D  less than 20 ng/mL (1,2). The Endocrine Society went on to  further define vitamin D  insufficiency as a level between 21 and 29  ng/mL (2).  1. IOM (Institute of Medicine). 2010. Dietary reference intakes for  calcium  and D. [place]: The [employer]. 2. Holick MF, Binkley Ramona, Bischoff-Ferrari HA, et al. Evaluation,  treatment, and prevention of vitamin D  deficiency: an Endocrine  Society  clinical practice guideline, JCEM. 2011 Jul; 96(7): 1911-30.  Performed at [facility] Lab, [address]  [place], [place] [address]    Vitamin B-12 09/02/2023 292  180 - 914 pg/mL Final   Comment: (NOTE) This assay is not validated for testing neonatal or myeloproliferative syndrome specimens for Vitamin B12 levels. Performed at [facility], [address], [place], [place] [address]    Hepatitis B Surface Ag 09/02/2023 NON REACTIVE  NON REACTIVE Final   HCV Ab 09/02/2023 Reactive (A)  NON REACTIVE Final   Comment: (NOTE) The CDC recommends that a Reactive HCV antibody result be followed up  with a HCV Nucleic Acid Amplification test.     Hep A IgM 09/02/2023 NON REACTIVE  NON REACTIVE Final   Hep B C IgM 09/02/2023 NON REACTIVE  NON REACTIVE Final   Performed at [facility], [address] [place], [place] [address]  Admission on 08/31/2023, Discharged on 08/31/2023  Component Date Value Ref Range Status   Sodium 08/31/2023 136  135 - 145 mmol/L Final   Potassium 08/31/2023 3.6  3.5 - 5.1 mmol/L Final   Chloride 08/31/2023 104  98 - 111 mmol/L Final   CO2 08/31/2023 25  22 - 32 mmol/L Final   Glucose, Bld 08/31/2023 88  70 - 99 mg/dL Final   Glucose reference range applies only to samples taken after fasting for at least 8 hours.   BUN 08/31/2023 13  8 - 23 mg/dL Final   Creatinine, Ser 08/31/2023 0.75  0.61 - 1.24 mg/dL Final   Calcium  08/31/2023 9.0  8.9 - 10.3 mg/dL Final   Total Protein 92/75/7974 7.9  6.5 - 8.1 g/dL Final   Albumin 92/75/7974 4.0  3.5 - 5.0 g/dL Final   AST 92/75/7974 26  15 - 41 U/L Final   ALT 08/31/2023 20  0 - 44 U/L Final   Alkaline Phosphatase 08/31/2023 64  38 - 126 U/L Final   Total Bilirubin 08/31/2023 0.7  0.0 - 1.2 mg/dL Final   GFR, Estimated 08/31/2023 >60  >60 mL/min Final   Comment: (NOTE) Calculated using the CKD-EPI Creatinine Equation (2021)    Anion gap 08/31/2023 7  5 - 15 Final   Performed at [facility] Lab, [address] [place], [place] [address]   WBC 08/31/2023 7.8  4.0 - 10.5 K/uL Final   RBC 08/31/2023 4.55  4.22 - 5.81 MIL/uL Final   Hemoglobin 08/31/2023 13.0  13.0 - 17.0 g/dL Final   HCT 92/75/7974 39.7  39.0 - 52.0 % Final   MCV 08/31/2023 87.3  80.0 - 100.0 fL Final   MCH 08/31/2023 28.6  26.0 - 34.0 pg Final   MCHC 08/31/2023 32.7  30.0 - 36.0 g/dL Final   RDW 92/75/7974 13.9  11.5 - 15.5 % Final   Platelets 08/31/2023 257  150 - 400 K/uL Final   nRBC 08/31/2023 0.0  0.0 - 0.2 % Final   Neutrophils Relative % 08/31/2023 52  % Final   Neutro Abs 08/31/2023 4.0  1.7 - 7.7 K/uL Final   Lymphocytes Relative 08/31/2023 33  % Final   Lymphs Abs 08/31/2023 2.6  0.7 - 4.0 K/uL Final   Monocytes Relative 08/31/2023 9  % Final   Monocytes Absolute 08/31/2023 0.7  0.1 - 1.0 K/uL Final   Eosinophils Relative 08/31/2023 5  % Final   Eosinophils Absolute 08/31/2023 0.4  0.0 - 0.5 K/uL Final   Basophils Relative 08/31/2023 1  % Final   Basophils Absolute 08/31/2023 0.1  0.0 - 0.1 K/uL Final   Immature Granulocytes 08/31/2023  0  % Final   Abs Immature Granulocytes 08/31/2023 0.01  0.00 - 0.07 K/uL Final   Performed at [facility], [address] [place], [place] [address]   Acetaminophen  (Tylenol ), Serum 08/31/2023 <10 (L)  10 - 30 ug/mL Final   Comment: (NOTE) Therapeutic concentrations vary significantly. A range of 10-30 ug/mL  may be an effective concentration for many patients. However, some  are best treated at concentrations outside of this range. Acetaminophen  concentrations >150 ug/mL at 4 hours after ingestion  and >50 ug/mL at 12 hours after ingestion are often associated with  toxic reactions.  Performed at [facility], [address], [place], [place] [address]    Alcohol, Ethyl (B) 08/31/2023 <15  <15 mg/dL Final   Comment: (NOTE) For medical purposes only. Performed at [facility], [address], [place], [place] [address]     Salicylate Lvl 08/31/2023 <7.0 (L)  7.0 - 30.0 mg/dL Final   Performed at [facility], [address], [place], [place] [address]   Opiates 08/31/2023 NONE DETECTED  NONE DETECTED Final   Cocaine 08/31/2023 POSITIVE (A)  NONE DETECTED Final   Benzodiazepines 08/31/2023 NONE DETECTED  NONE DETECTED Final   Amphetamines 08/31/2023 NONE DETECTED  NONE DETECTED Final   Tetrahydrocannabinol 08/31/2023 NONE DETECTED  NONE DETECTED Final   Barbiturates 08/31/2023 NONE DETECTED  NONE DETECTED Final   Comment: (NOTE) DRUG SCREEN FOR MEDICAL PURPOSES ONLY.  IF CONFIRMATION IS NEEDED FOR ANY PURPOSE, NOTIFY LAB WITHIN 5 DAYS.  LOWEST DETECTABLE LIMITS FOR URINE DRUG SCREEN Drug Class                     Cutoff (ng/mL) Amphetamine and metabolites    1000 Barbiturate and metabolites    200 Benzodiazepine                 200 Opiates and metabolites        300 Cocaine and metabolites        300 THC                            50 Performed at [facility] Lab, [address] [place], [place] [address]    Specimen Source 08/31/2023 URINE, CLEAN CATCH   Final   Color, Urine 08/31/2023 YELLOW  YELLOW Final   APPearance 08/31/2023 CLEAR  CLEAR Final   Specific Gravity, Urine 08/31/2023 1.024  1.005 - 1.030 Final   pH 08/31/2023 5.0  5.0 - 8.0 Final   Glucose, UA 08/31/2023 NEGATIVE  NEGATIVE mg/dL Final   Hgb urine dipstick 08/31/2023 NEGATIVE  NEGATIVE Final   Bilirubin Urine 08/31/2023 NEGATIVE  NEGATIVE Final   Ketones, ur 08/31/2023 NEGATIVE  NEGATIVE mg/dL Final   Protein, ur 92/75/7974 NEGATIVE  NEGATIVE mg/dL Final   Nitrite 92/75/7974 NEGATIVE  NEGATIVE Final   Leukocytes,Ua 08/31/2023 NEGATIVE  NEGATIVE Final   RBC / HPF 08/31/2023 0-5  0 - 5 RBC/hpf Final   WBC, UA 08/31/2023 0-5  0 - 5 WBC/hpf Final   Comment:        Reflex urine culture not performed if WBC <=10, OR if Squamous epithelial cells >5. If Squamous epithelial cells >5 suggest recollection.    Bacteria, UA  08/31/2023 NONE SEEN  NONE SEEN Final   Squamous Epithelial / HPF 08/31/2023 0-5  0 - 5 /HPF Final   Mucus 08/31/2023 PRESENT   Final  Performed at [facility] Lab, [address], [place], [place] [address]  Office Visit on 08/29/2023  Component Date Value Ref Range Status   WBC 08/29/2023 7.1  3.4 - 10.8 x10E3/uL Final   RBC 08/29/2023 4.78  4.14 - 5.80 x10E6/uL Final   Hemoglobin 08/29/2023 13.2  13.0 - 17.7 g/dL Final   Hematocrit 92/77/7974 42.6  37.5 - 51.0 % Final   MCV 08/29/2023 89  79 - 97 fL Final   MCH 08/29/2023 27.6  26.6 - 33.0 pg Final   MCHC 08/29/2023 31.0 (L)  31.5 - 35.7 g/dL Final   RDW 92/77/7974 13.3  11.6 - 15.4 % Final   Platelets 08/29/2023 251  150 - 450 x10E3/uL Final   Neutrophils 08/29/2023 54  Not Estab. % Final   Lymphs 08/29/2023 30  Not Estab. % Final   Monocytes 08/29/2023 11  Not Estab. % Final   Eos 08/29/2023 4  Not Estab. % Final   Basos 08/29/2023 1  Not Estab. % Final   Neutrophils Absolute 08/29/2023 3.8  1.4 - 7.0 x10E3/uL Final   Lymphocytes Absolute 08/29/2023 2.1  0.7 - 3.1 x10E3/uL Final   Monocytes Absolute 08/29/2023 0.8  0.1 - 0.9 x10E3/uL Final   EOS (ABSOLUTE) 08/29/2023 0.3  0.0 - 0.4 x10E3/uL Final   Basophils Absolute 08/29/2023 0.1  0.0 - 0.2 x10E3/uL Final   Immature Granulocytes 08/29/2023 0  Not Estab. % Final   Immature Grans (Abs) 08/29/2023 0.0  0.0 - 0.1 x10E3/uL Final   CRP 08/29/2023 4  0 - 10 mg/L Final   Sed Rate 08/29/2023 35 (H)  0 - 30 mm/hr Final   Glucose 08/29/2023 92  70 - 99 mg/dL Final   BUN 92/77/7974 14  8 - 27 mg/dL Final   Creatinine, Ser 08/29/2023 0.88  0.76 - 1.27 mg/dL Final   eGFR 92/77/7974 98  >59 mL/min/1.73 Final   BUN/Creatinine Ratio 08/29/2023 16  10 - 24 Final   Sodium 08/29/2023 140  134 - 144 mmol/L Final   Potassium 08/29/2023 4.1  3.5 - 5.2 mmol/L Final   Chloride 08/29/2023 102  96 - 106 mmol/L Final   CO2 08/29/2023 20  20 - 29 mmol/L Final   Calcium  08/29/2023 9.2  8.6 - 10.2  mg/dL Final  No results displayed because visit has over 200 results.    Office Visit on 08/10/2023  Component Date Value Ref Range Status   HIV 1 RNA Quant 08/10/2023 2,420 (H)  NOT DETECTED copies/mL Final   HIV-1 RNA Quant, Log 08/10/2023 3.38 (H)  NOT DETECTED Log copies/mL Final   Comment: . This test was performed using Real-Time Polymerase Chain Reaction. . Reportable Range: 20 copies/mL to 10,000,000 copies/mL (1.30 log copies/mL to 7.00 log copies/mL).    WBC 08/10/2023 5.6  3.8 - 10.8 Thousand/uL Final   RBC 08/10/2023 4.98  4.20 - 5.80 Million/uL Final   Hemoglobin 08/10/2023 13.9  13.2 - 17.1 g/dL Final   HCT 92/96/7974 43.7  38.5 - 50.0 % Final   MCV 08/10/2023 87.8  80.0 - 100.0 fL Final   MCH 08/10/2023 27.9  27.0 - 33.0 pg Final   MCHC 08/10/2023 31.8 (L)  32.0 - 36.0 g/dL Final   Comment: For adults, a slight decrease in the calculated MCHC value (in the range of 30 to 32 g/dL) is most likely not clinically significant; however, it should be interpreted with caution in correlation with other red cell parameters and the patient's clinical condition.  RDW 08/10/2023 13.2  11.0 - 15.0 % Final   Platelets 08/10/2023 313  140 - 400 Thousand/uL Final   MPV 08/10/2023 9.7  7.5 - 12.5 fL Final   Glucose, Bld 08/10/2023 96  65 - 99 mg/dL Final   Comment: .            Fasting reference interval .    BUN 08/10/2023 11  7 - 25 mg/dL Final   Creat 92/96/7974 0.77  0.70 - 1.35 mg/dL Final   BUN/Creatinine Ratio 08/10/2023 SEE NOTE:  6 - 22 (calc) Final   Comment:    Not Reported: BUN and Creatinine are within    reference range. .    Sodium 08/10/2023 140  135 - 146 mmol/L Final   Potassium 08/10/2023 3.6  3.5 - 5.3 mmol/L Final   Chloride 08/10/2023 104  98 - 110 mmol/L Final   CO2 08/10/2023 27  20 - 32 mmol/L Final   Calcium  08/10/2023 9.2  8.6 - 10.3 mg/dL Final   Total Protein 92/96/7974 7.4  6.1 - 8.1 g/dL Final   Albumin 92/96/7974 4.3  3.6 - 5.1 g/dL  Final   Globulin 92/96/7974 3.1  1.9 - 3.7 g/dL (calc) Final   AG Ratio 08/10/2023 1.4  1.0 - 2.5 (calc) Final   Total Bilirubin 08/10/2023 0.3  0.2 - 1.2 mg/dL Final   Alkaline phosphatase (APISO) 08/10/2023 75  35 - 144 U/L Final   AST 08/10/2023 29  10 - 35 U/L Final   ALT 08/10/2023 27  9 - 46 U/L Final    Blood Alcohol level:  Lab Results  Component Value Date   ETH <15 08/31/2023   ETH <10 11/04/2016    Metabolic Disorder Labs: Lab Results  Component Value Date   HGBA1C 5.5 09/02/2023   MPG 111.15 09/02/2023   No results found for: PROLACTIN Lab Results  Component Value Date   CHOL 180 08/16/2023   TRIG 63 08/16/2023   HDL 45 08/16/2023   CHOLHDL 4.0 08/16/2023   VLDL 13 08/16/2023   LDLCALC 122 (H) 08/16/2023   LDLCALC 128 (H) 05/11/2022    Therapeutic Lab Levels: No results found for: LITHIUM No results found for: VALPROATE No results found for: CBMZ  Physical Findings   PHQ2-9    Flowsheet Row Office Visit from 08/29/2023 in [facility] - A Dept Of [place]. [facility] Office Visit from 11/10/2022 in [facility] Dis - A Dept Of [place]. [facility] Office Visit from 10/26/2022 in [facility] - A Dept Of [place]. [facility] Office Visit from 09/01/2022 in [facility] - A Dept Of [place]. [facility] Office Visit from 05/11/2022 in [facility] Infect Dis - A Dept Of [place]. [facility]  PHQ-2 Total Score 0 0 0 0 0  PHQ-9 Total Score -- -- 0 -- --   Flowsheet Row ED from 09/01/2023 in [facility] Most recent reading at 09/03/2023 10:43 AM ED from 08/31/2023 in [facility] Most recent reading at 09/01/2023  1:16 AM ED from 08/31/2023 in [facility] Emergency Department at [facility] Most recent reading at 08/31/2023  6:33 PM  C-SSRS RISK CATEGORY No Risk Moderate Risk  High Risk     Musculoskeletal  Strength & Muscle Tone: within normal limits Gait & Station: unsteady Patient leans: N/A  Psychiatric  Specialty Exam  Presentation  General Appearance:  Casual  Eye Contact: Fair  Speech: Normal Rate  Speech Volume: Normal  Handedness: Right   Mood and Affect  Mood: Depressed  Affect: Congruent   Thought Process  Thought Processes: Linear  Descriptions of Associations:Intact  Orientation:Full (Time, Place and Person)  Thought Content:Logical  Diagnosis of Schizophrenia or Schizoaffective disorder in past: No    Hallucinations:Hallucinations: Auditory Description of Auditory Hallucinations: different things  Ideas of Reference:None  Suicidal Thoughts:Suicidal Thoughts: No  Homicidal Thoughts:Homicidal Thoughts: No   Sensorium  Memory: Immediate Fair; Recent Fair; Remote Fair  Judgment: Fair  Insight: Fair   Art therapist  Concentration: Fair  Attention Span: Fair  Recall: [employer] of Knowledge: Fair  Language: Fair   Psychomotor Activity  Psychomotor Activity: Psychomotor Activity: Normal   Assets  Assets: Desire for Improvement; Leisure Time   Sleep  Sleep: Sleep: Good  No Safety Checks orders active in given range  No data recorded   Physical Exam  Physical Exam Vitals and nursing note reviewed.  HENT:     Head: Normocephalic.  Eyes:     Extraocular Movements: Extraocular movements intact.  Pulmonary:     Effort: Pulmonary effort is normal.  Musculoskeletal:        General: Normal range of motion.     Cervical back: Normal range of motion.  Neurological:     General: No focal deficit present.     Mental Status: He is alert and oriented to person, place, and time.  Psychiatric:        Mood and Affect: Mood is depressed.        Behavior: Behavior normal.    Review of Systems  Constitutional:  Negative for chills and fever.  Gastrointestinal:  Negative for  constipation, diarrhea, nausea and vomiting.  Genitourinary:  Negative for dysuria.  Musculoskeletal:  Positive for joint pain and myalgias.  Skin:  Positive for itching and rash.  Neurological:  Negative for headaches.  Psychiatric/Behavioral:  Positive for depression and hallucinations.    Blood pressure 127/88, pulse 82, temperature 97.9 F (36.6 C), temperature source Oral, resp. rate 19, SpO2 97%. There is no height or weight on file to calculate BMI.  Treatment Plan Summary: Long Term Goals: Improvement in symptoms so as ready for discharge   Short Term Goals: Patient will verbalize feelings in meetings with treatment team members., Patient will attend at least of 50% of the groups daily., Pt will complete the PHQ9 on admission, day 3 and discharge., and Patient will take medications as prescribed daily.  7/28: Patient reports residual AH of voices but cannot make out what they're saying. Agreeable to Zyprexa  increase   Medications: Mood/anxiety: continue group therapy, milieu therapy, 1:1 evaluation with provider.  Medication management: increase olanzapine  to 7.5mg  PO at bedtime for hallucinations, changed remeron  7.5 mg PO at bedtime to PRN due to patient falling aslepe during the day for mood, anxiety and sleep  Substance Abuse:  stimulant use: encourage patient to maintain PO intake. Coverage PRN for secondary psychosis.  Alcohol use disorder: Replacing Thiamine . CIWA monitoring with benzodiazepine coverage per withdrawal scoring. Monitoring HR and BP.  Nicotine  and tobacco use: N/A Medical: PRNs for pain, constipation, indigestion available.  Labs/studies: vitamin D  low, starting ergocalciferol  50 000 units PO weekly.  HCV +, RNA pending.  Safety and Monitoring: voluntarily admission to [facility]/Facility based care unit [facility]) unit for safety, stabilization and treatment Daily contact with patient to assess and evaluate symptoms  and progress in treatment Patient's case to be  discussed in multi-disciplinary team meeting Observation Level : q15 minute checks Vital signs: q12 hours Precautions: withdrawal and fall   Based on my evaluation the patient does not appear to have an emergency medical condition.   Taylin Leder, MD 09/04/2023 7:07 PM

## 2023-09-04 NOTE — Group Note (Signed)
 Group Topic: Understanding Self  Group Date: 09/04/2023 Start Time: 1800 End Time: 8164 Facilitators: Daved Tinnie HERO, RN  Department: [facility]  Number of Participants: 6  Group Focus: nursing group Treatment Modality:  Psychoeducation Interventions utilized were exploration, group exercise, and story telling Purpose: increase insight  Name: William [name] Date of Birth: 1960-11-14  MR: [id number]    Level of Participation: active Quality of Participation: attentive and cooperative Interactions with others: gave feedback Mood/Affect: appropriate Triggers (if applicable): n/a Cognition: coherent/clear Progress: Gaining insight Response: pt identified his physical health as one of his core values Plan: patient will be encouraged to attend future nursing groups  Patients Problems:  Patient Active Problem List   Diagnosis Date Noted   Hepatitis C antibody detected 09/03/2023   Cocaine dependence (HCC) 09/02/2023   Dilatation of aorta ~26mm  (HCC) 08/18/2023   MSSA bacteremia 08/18/2023   Staphylococcus aureus bacteremia 08/17/2023   Cellulitis of right leg 08/16/2023   Healthcare maintenance 07/14/2023   Cellulitis of right lower extremity 02/12/2023   Eczematous dermatitis 02/06/2023   Skin ulcers of foot, bilateral (HCC), R> L, painful 02/05/2023   Hyperlipidemia 09/01/2022   Dysphagia 01/12/2021   Gastritis 01/12/2021   Screening for colon cancer 05/25/2020   Hypertension 03/27/2019   GERD with esophagitis 09/01/2016   Cigarette smoker 12/23/2014   Seasonal allergies 01/22/2013   Human immunodeficiency virus (HIV) disease (HCC) 07/10/2006   Alcohol use disorder, moderate, dependence (HCC) 07/10/2006

## 2023-09-04 NOTE — ED Notes (Signed)
 Pt is generally pleasant and cooperative. Pt reports he is 'okay' , but continues to feel some anxiety and depression. Pt denies si and hi. Pt ate breakfast. Pt denies complaints r/t bilateral foot wounds. Pt denies physical discomforts or pain. Pt ate breakfast and was compliant with all morning medications.

## 2023-09-04 NOTE — ED Notes (Signed)
 Patient is sleeping. Respirations equal and unlabored. No change in assessment or acuity. Routine safety checks conducted according to facility protocol.

## 2023-09-04 NOTE — Discharge Planning (Signed)
 LCSW met with patient to assess current mood, affect, physical state, and inquire about needs/goals while here in [facility] and after discharge. Patient reports he presented to [facility] for detox and is seeking a residential treatment program for alcohol abuse and cocaine.   Patient stated that his chief concern is for his alcohol dependence and drinks 4 40 ounces of beer daily. He stated that he only every know and then and later reported 1-w2 times per week using 1 gram by smoking it. Patient stated he was living homeless and has plans to stay with his brother when he discharges from here or his residential treatment center.   Patient receives disability and has HIV. He see's a primary physician and infectious disease routinely and uses the city bus. Patient reports his current goal is to seek residential placement for substance use.   Patient stated he had done a residential treatment center many years ago and can't remember the name. Patient currently denies any SI/HI/AVH. Patient has bilateral foot wounds with recent sepsis and IV antibiotics and both are being treated and wrapped at this time.   Patient will not be able to admit to any residential treatment program until these are fully healed with no need to be wrapped.   Patient aware that LCSW will send referrals out for review and will follow up to provide updates as received. Patient expressed understanding and appreciation of LCSW assistance. No other needs were reported at this time by patient.

## 2023-09-04 NOTE — Group Note (Signed)
 Group Topic: Relapse and Recovery  Group Date: 09/03/2023 Start Time: 2000 End Time: 2045 Facilitators: Lenon Lenice SAUNDERS, NT  Department: [facility]  Number of Participants: 4  Group Focus: coping skills Treatment Modality:  Individual Therapy Interventions utilized were group exercise Purpose: express feelings  Name: William [name] Date of Birth: 16-Mar-1960  MR: [id number]    Level of Participation: pt did not attend group Quality of Participation: pt did not attend group Interactions with others: pt did not attend group Mood/Affect: pt did not attend group Triggers (if applicable): pt did not attend group Cognition: pt did not attend group Progress: pt did not attend group Response: pt did not attend group Plan: pt did not attend group  Patients Problems:  Patient Active Problem List   Diagnosis Date Noted   Hepatitis C antibody detected 09/03/2023   Cocaine dependence (HCC) 09/02/2023   Dilatation of aorta ~35mm  (HCC) 08/18/2023   MSSA bacteremia 08/18/2023   Staphylococcus aureus bacteremia 08/17/2023   Cellulitis of right leg 08/16/2023   Healthcare maintenance 07/14/2023   Cellulitis of right lower extremity 02/12/2023   Eczematous dermatitis 02/06/2023   Skin ulcers of foot, bilateral (HCC), R> L, painful 02/05/2023   Hyperlipidemia 09/01/2022   Dysphagia 01/12/2021   Gastritis 01/12/2021   Screening for colon cancer 05/25/2020   Hypertension 03/27/2019   GERD with esophagitis 09/01/2016   Cigarette smoker 12/23/2014   Seasonal allergies 01/22/2013   Human immunodeficiency virus (HIV) disease (HCC) 07/10/2006   Alcohol use disorder, moderate, dependence (HCC) 07/10/2006

## 2023-09-04 NOTE — ED Notes (Signed)
 Patient asleep with eyes closed. NAD  Will continue to monitor for safety.

## 2023-09-04 NOTE — ED Notes (Signed)
 Patient asleep,CIWA assessment not completed at this time.

## 2023-09-04 NOTE — Group Note (Signed)
 Group Topic: Positive Affirmations  Group Date: 09/04/2023 Start Time: 9184 End Time: 0830 Facilitators: Lonzell Dwayne RAMAN, NT  Department: [facility]  Number of Participants: 1  Group Focus: feeling awareness/expression Treatment Modality:  Individual Therapy Interventions utilized were patient education Purpose: regain self-worth  Name: William [name] Date of Birth: 04/07/1960  MR: [id number]    Level of Participation: Patient did not attend group Quality of Participation: N/A Interactions with others: N/A Mood/Affect: N/A Triggers (if applicable): N/A Cognition: N/A Progress: N/A Response: N/A Plan: N/A  Patients Problems:  Patient Active Problem List   Diagnosis Date Noted   Hepatitis C antibody detected 09/03/2023   Cocaine dependence (HCC) 09/02/2023   Dilatation of aorta ~62mm  (HCC) 08/18/2023   MSSA bacteremia 08/18/2023   Staphylococcus aureus bacteremia 08/17/2023   Cellulitis of right leg 08/16/2023   Healthcare maintenance 07/14/2023   Cellulitis of right lower extremity 02/12/2023   Eczematous dermatitis 02/06/2023   Skin ulcers of foot, bilateral (HCC), R> L, painful 02/05/2023   Hyperlipidemia 09/01/2022   Dysphagia 01/12/2021   Gastritis 01/12/2021   Screening for colon cancer 05/25/2020   Hypertension 03/27/2019   GERD with esophagitis 09/01/2016   Cigarette smoker 12/23/2014   Seasonal allergies 01/22/2013   Human immunodeficiency virus (HIV) disease (HCC) 07/10/2006   Alcohol use disorder, moderate, dependence (HCC) 07/10/2006

## 2023-09-04 NOTE — ED Notes (Signed)
 Pt ate lunch. Pt has denied complaints this shift thus far. Pt currently watching TV in dayroom, no distress observed.

## 2023-09-05 ENCOUNTER — Other Ambulatory Visit

## 2023-09-05 ENCOUNTER — Inpatient Hospital Stay: Payer: Self-pay | Admitting: Internal Medicine

## 2023-09-05 DIAGNOSIS — F1094 Alcohol use, unspecified with alcohol-induced mood disorder: Secondary | ICD-10-CM

## 2023-09-05 DIAGNOSIS — F1414 Cocaine abuse with cocaine-induced mood disorder: Secondary | ICD-10-CM

## 2023-09-05 DIAGNOSIS — Z0279 Encounter for issue of other medical certificate: Secondary | ICD-10-CM | POA: Diagnosis not present

## 2023-09-05 LAB — HCV RNA DIAGNOSIS, NAA: HCV RNA, Quantitation: NOT DETECTED [IU]/mL

## 2023-09-05 MED ORDER — BACITRACIN ZINC 500 UNIT/GM EX OINT
1.0000 | TOPICAL_OINTMENT | Freq: Every day | CUTANEOUS | Status: DC | PRN
  Filled 2023-09-05: qty 56.7

## 2023-09-05 NOTE — ED Notes (Signed)
 Pt is sleeping . No acute distress noted.

## 2023-09-05 NOTE — Group Note (Signed)
 Group Topic: Recovery Basics  Group Date: 09/05/2023 Start Time: 2040 End Time: 2119 Facilitators: Joan Plowman B  Department: [facility]  Number of Participants: 4  Group Focus: abuse issues and activities of daily living skills Treatment Modality:  Individual Therapy Interventions utilized were leisure development Purpose: enhance coping skills and express feelings  Name: William [name] Date of Birth: [birth year]  MR: [id number]    Level of Participation: PT DID NOT attend group Quality of Participation: cooperative Interactions with others: gave feedback Mood/Affect: NA Triggers (if applicable): NA Cognition: NA Progress: None Response: NA Plan: patient will be encouraged to go to groups.   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   Cocaine abuse with cocaine-induced mood disorder (HCC) 09/05/2023   Hepatitis C antibody detected 09/03/2023   Cocaine dependence (HCC) 09/02/2023   Dilatation of aorta ~93mm  (HCC) 08/18/2023   MSSA bacteremia 08/18/2023   Staphylococcus aureus bacteremia 08/17/2023   Cellulitis of right leg 08/16/2023   Healthcare maintenance 07/14/2023   Cellulitis of right lower extremity 02/12/2023   Eczematous dermatitis 02/06/2023   Skin ulcers of foot, bilateral (HCC), R> L, painful 02/05/2023   Hyperlipidemia 09/01/2022   Dysphagia 01/12/2021   Gastritis 01/12/2021   Screening for colon cancer 05/25/2020   Hypertension 03/27/2019   GERD with esophagitis 09/01/2016   Cigarette smoker 12/23/2014   Seasonal allergies 01/22/2013   Human immunodeficiency virus (HIV) disease (HCC) 07/10/2006   Alcohol use disorder, moderate, dependence (HCC) 07/10/2006

## 2023-09-05 NOTE — ED Notes (Signed)
 Pt alert and anxious. Patient focus is on being discharged and he offers no other concerns. Behavior has been controlled. Denies si/hi/avh. Will continue to monitor and report changes as noted.

## 2023-09-05 NOTE — Discharge Planning (Signed)
 SW spoke with patient at length regarding his wounds on feet and explained that residential programs will not accept any current wounds or medical concerns due to not having medical staff in the facilities. Patient voiced full understanding and is aware that when his wounds are fully healed he can follow up with residential programs.  SW made him aware [facility] was yet to determine and would find out today. Patient stated if he wasn't accepted in [facility] he would return home to his brother's home tomorrow. SW will make MD and RN aware.  SW will arrange for outpatient psychiatry and IOP in his [place] code [id number]. Will continue to follow.

## 2023-09-05 NOTE — ED Notes (Signed)
 Pt alert and cooperative and pleasant on approach. Visible all shift. Observed attending groups. behavior is controlled.  Will continue to monitor and report changes as noted

## 2023-09-05 NOTE — ED Notes (Signed)
 Pt wants his foot wrapped up Told him I would let the RN know that he is passing meds right now and it may be a min because theres people in front of him.

## 2023-09-05 NOTE — Group Note (Signed)
 Group Topic: Wellness  Group Date: 09/05/2023 Start Time: 1400 End Time: 1430 Facilitators: Atanacio Melnyk, Cloyd SAUNDERS, RN  Department: [facility]  Number of Participants: 7  Group Focus: other healthy diet Treatment Modality:  Leisure Development Interventions utilized were story telling Purpose: increase insight  Name: William [name] Date of Birth: May 08, 1960  MR: [id number]    Level of Participation: active Quality of Participation: cooperative Interactions with others: gave feedback Mood/Affect: appropriate Triggers (if applicable): na Cognition: coherent/clear Progress: Moderate Response: na Plan: follow-up needed  Patients Problems:  Patient Active Problem List   Diagnosis Date Noted   Hepatitis C antibody detected 09/03/2023   Cocaine dependence (HCC) 09/02/2023   Dilatation of aorta ~63mm  (HCC) 08/18/2023   MSSA bacteremia 08/18/2023   Staphylococcus aureus bacteremia 08/17/2023   Cellulitis of right leg 08/16/2023   Healthcare maintenance 07/14/2023   Cellulitis of right lower extremity 02/12/2023   Eczematous dermatitis 02/06/2023   Skin ulcers of foot, bilateral (HCC), R> L, painful 02/05/2023   Hyperlipidemia 09/01/2022   Dysphagia 01/12/2021   Gastritis 01/12/2021   Screening for colon cancer 05/25/2020   Hypertension 03/27/2019   GERD with esophagitis 09/01/2016   Cigarette smoker 12/23/2014   Seasonal allergies 01/22/2013   Human immunodeficiency virus (HIV) disease (HCC) 07/10/2006   Alcohol use disorder, moderate, dependence (HCC) 07/10/2006

## 2023-09-05 NOTE — Group Note (Signed)
 Group Topic: Overcoming Obstacles  Group Date: 09/05/2023 Start Time: 1000 End Time: 1045 Facilitators: Alyse Leilani LABOR, NT  Department: [facility]  Number of Participants: 7  Group Focus: clarity of thought and coping skills Treatment Modality:  Patient-Centered Therapy and Skills Training Interventions utilized were group exercise, patient education, and story telling Purpose: improve communication skills and increase insight  Name: William [name] Date of Birth: September 30, 1960  MR: [id number]    Level of Participation: active Quality of Participation: cooperative Interactions with others: gave feedback Mood/Affect: flat Triggers (if applicable): none Cognition: no insight Progress: Minimal Response: Be active in group Plan: patient will be encouraged to attend group while here.  Patients Problems:  Patient Active Problem List   Diagnosis Date Noted   Hepatitis C antibody detected 09/03/2023   Cocaine dependence (HCC) 09/02/2023   Dilatation of aorta ~85mm  (HCC) 08/18/2023   MSSA bacteremia 08/18/2023   Staphylococcus aureus bacteremia 08/17/2023   Cellulitis of right leg 08/16/2023   Healthcare maintenance 07/14/2023   Cellulitis of right lower extremity 02/12/2023   Eczematous dermatitis 02/06/2023   Skin ulcers of foot, bilateral (HCC), R> L, painful 02/05/2023   Hyperlipidemia 09/01/2022   Dysphagia 01/12/2021   Gastritis 01/12/2021   Screening for colon cancer 05/25/2020   Hypertension 03/27/2019   GERD with esophagitis 09/01/2016   Cigarette smoker 12/23/2014   Seasonal allergies 01/22/2013   Human immunodeficiency virus (HIV) disease (HCC) 07/10/2006   Alcohol use disorder, moderate, dependence (HCC) 07/10/2006

## 2023-09-06 ENCOUNTER — Encounter: Payer: Self-pay | Admitting: Student

## 2023-09-06 ENCOUNTER — Telehealth: Payer: Self-pay | Admitting: *Deleted

## 2023-09-06 ENCOUNTER — Ambulatory Visit: Admitting: Student

## 2023-09-06 ENCOUNTER — Other Ambulatory Visit: Payer: Self-pay

## 2023-09-06 ENCOUNTER — Other Ambulatory Visit (HOSPITAL_COMMUNITY): Payer: Self-pay

## 2023-09-06 VITALS — BP 101/68 | HR 118 | Temp 98.3°F | Ht 71.0 in | Wt 189.6 lb

## 2023-09-06 DIAGNOSIS — Z0279 Encounter for issue of other medical certificate: Secondary | ICD-10-CM | POA: Diagnosis not present

## 2023-09-06 DIAGNOSIS — I10 Essential (primary) hypertension: Secondary | ICD-10-CM

## 2023-09-06 DIAGNOSIS — R7881 Bacteremia: Secondary | ICD-10-CM | POA: Diagnosis not present

## 2023-09-06 DIAGNOSIS — L03115 Cellulitis of right lower limb: Secondary | ICD-10-CM | POA: Diagnosis not present

## 2023-09-06 DIAGNOSIS — B9561 Methicillin susceptible Staphylococcus aureus infection as the cause of diseases classified elsewhere: Secondary | ICD-10-CM | POA: Diagnosis not present

## 2023-09-06 MED ORDER — BACITRACIN ZINC 500 UNIT/GM EX OINT
1.0000 | TOPICAL_OINTMENT | Freq: Every day | CUTANEOUS | 0 refills | Status: DC | PRN
  Filled 2023-09-06: qty 113.6, 30d supply, fill #0

## 2023-09-06 MED ORDER — PANTOPRAZOLE SODIUM 40 MG PO TBEC
40.0000 mg | DELAYED_RELEASE_TABLET | Freq: Every day | ORAL | 0 refills | Status: DC
  Filled 2023-09-06: qty 30, 30d supply, fill #0

## 2023-09-06 MED ORDER — OLANZAPINE 7.5 MG PO TABS
7.5000 mg | ORAL_TABLET | Freq: Every day | ORAL | 0 refills | Status: DC
  Filled 2023-09-06: qty 30, 30d supply, fill #0

## 2023-09-06 NOTE — Progress Notes (Signed)
  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  He was recently hospitalized due to suicidal thoughts and substance induced mood disorder. Olanzapine  was started. He was discharged today and came straight here for his return to work note.  Prior to that he was hospitalized for MSSA bacteremia and completed a course of cefazolin  outside of the hospital. PICC line has since been removed.  Review of Systems  Constitutional:  Negative for chills and fever.  Skin:  Positive for rash (improving).  Neurological:  Positive for dizziness.    Current Outpatient Medications  Medication Instructions   bacitracin  ointment 1 Application, Topical, Daily PRN   bictegravir-emtricitabine -tenofovir  AF (BIKTARVY ) 50-200-25 MG TABS tablet 1 tablet, Oral, Daily   OLANZapine  (ZYPREXA ) 7.5 mg, Oral, Daily at bedtime   pantoprazole  (PROTONIX ) 40 mg, Oral, Daily   rosuvastatin  (CRESTOR ) 10 mg, Oral, Daily   triamcinolone  ointment (KENALOG ) 0.5 % Topical, 3 times daily     Objective  Today's Vitals   09/06/23 1518  BP: 101/68  Pulse: (!) 118  Temp: 98.3 F (36.8 C)  TempSrc: Oral  SpO2: 95%  Weight: 189 lb 9.6 oz (86 kg)  Height: 5' 11 (1.803 m)  Body mass index is 26.44 kg/m.   Physical Exam Constitutional:      Appearance: Normal appearance.  Cardiovascular:     Rate and Rhythm: Regular rhythm. Tachycardia present.     Heart sounds: No murmur heard. Pulmonary:     Effort: Pulmonary effort is normal. No respiratory distress.  Musculoskeletal:     Comments: Both legs are wrapped  Skin:    General: Skin is warm and dry.  Neurological:     Mental Status: He is alert.     Cranial Nerves: No facial asymmetry.  Psychiatric:        Mood and Affect: Affect normal.        Speech: Speech normal.        Behavior:  Behavior normal.      Assessment & Plan   Cellulitis of right lower extremity Assessment & Plan: Improving, now off antibiotics, has appointment for wound care tomorrow.   MSSA bacteremia  Primary hypertension Assessment & Plan: Somewhat hypotensive today with orthostatic changes and tachycardia. He's hypovolemic from losses during time spent in the sun. Discontinue hydrochlorothiazide  for now. Return to clinic in 1 week for follow-up.  Medications Discontinued During This Encounter  Medication Reason   hydrochlorothiazide  (HYDRODIURIL ) 25 MG tablet         Return in about 1 week (around 09/13/2023) for low blood pressure.  Ozell Kung MD 09/06/2023, 6:03 PM

## 2023-09-06 NOTE — ED Provider Notes (Signed)
 Behavioral Health Progress Note  Date and Time: 07/329/25 12:44 PM Name: William [name] MRN:  [id number]  Subjective:  William [name] is feeling pretty good today. He slept better overnight with transition/increase olanzapine . He denies adverse effects. He's eager to discharge home (brother) tomorrow. Although he would have preferred to transition directly to a residential substance program, he is aware that wounds have to heal. He plans to return to work nearby while healing (if allowed). Patient endorses plan to be fully medication compliant given his psychiatric and somatic medical issues.   HPI: 63 YO M with HIV, recent treatment for sepsis, chronic BLE wounds, alcoholism and other medical illness who presented initially with complaints of suicidal ideation and was admitted to OBS at the [facility] and later transferred to [facility] for further evaluation and management of substance and mood issues.   Diagnosis:  Final diagnoses:  Alcohol-induced mood disorder (HCC)  Cocaine abuse with cocaine-induced mood disorder (HCC)  Alcohol dependence with uncomplicated withdrawal (HCC)   Total Time spent with patient: I personally spent 20 minutes on the unit in direct patient care. The direct patient care time included face-to-face time with the patient, reviewing the patient's chart, communicating with other professionals, and coordinating care. Greater than 50% of this time was spent in counseling or coordinating care with the patient regarding goals of hospitalization, psycho-education, and discharge planning needs.  Past Psychiatric History: previously diagnosed with alcohol use disorder. No previous suicide attempts.  Past Medical History: HIV, hypertension, history of hepatitis C, history of pneumothorax, GERD, hyperlipidemia and sepsis related to cellulitis of the lower extremities  Family History: adopted Social History: mother died recently. Patient living with brother. Using alcohol since teens,  currently 2-4 40 ounce beers per day. Former smoker. Previously worked as a Arboriculturist.   Sleep: Fair  Appetite:  Good  Current Medications:  Current Facility-Administered Medications  Medication Dose Route Frequency Provider Last Rate Last Admin   acetaminophen  (TYLENOL ) tablet 650 mg  650 mg Oral Q6H PRN Lera Golas B, DO       alum & mag hydroxide-simeth (MAALOX/MYLANTA) 200-200-20 MG/5ML suspension 30 mL  30 mL Oral Q4H PRN Prunty, Donald B, DO       bacitracin  ointment 1 Application  1 Application Topical Daily PRN Onuoha, Chinwendu V, NP       bictegravir-emtricitabine -tenofovir  AF (BIKTARVY ) 50-200-25 MG per tablet 1 tablet  1 tablet Oral Daily Prunty, Donald B, DO   1 tablet at 09/06/23 1021   haloperidol  lactate (HALDOL ) injection 5 mg  5 mg Intramuscular TID PRN Lera Golas B, DO       And   diphenhydrAMINE  (BENADRYL ) injection 50 mg  50 mg Intramuscular TID PRN Lera Golas B, DO       And   LORazepam  (ATIVAN ) injection 2 mg  2 mg Intramuscular TID PRN Prunty, Donald B, DO       hydrochlorothiazide  (HYDRODIURIL ) tablet 25 mg  25 mg Oral Daily Prunty, Donald B, DO   25 mg at 09/06/23 1021   ketoconazole  (NIZORAL ) 2 % cream   Topical Daily Leigh Corean Massa, MD   1 Application at 09/06/23 1021   magnesium  hydroxide (MILK OF MAGNESIA) suspension 30 mL  30 mL Oral Daily PRN Lera Golas B, DO       mirtazapine  (REMERON ) tablet 7.5 mg  7.5 mg Oral QHS PRN Zouev, Dmitri, MD       OLANZapine  (ZYPREXA ) tablet 7.5 mg  7.5 mg Oral QHS Zouev, Dmitri, MD  7.5 mg at 09/05/23 2204   OLANZapine  zydis (ZYPREXA ) disintegrating tablet 5 mg  5 mg Oral TID PRN Lera Golas B, DO       pantoprazole  (PROTONIX ) EC tablet 40 mg  40 mg Oral Daily Hill, Corean Massa, MD   40 mg at 09/06/23 1021   traZODone  (DESYREL ) tablet 50 mg  50 mg Oral QHS PRN Lera Golas B, DO   50 mg at 09/04/23 2139   Vitamin D  (Ergocalciferol ) (DRISDOL ) 1.25 MG (50000 UNIT) capsule 50,000 Units  50,000 Units  Oral Q7 days Leigh Corean Massa, MD   50,000 Units at 09/03/23 1359   Current Outpatient Medications  Medication Sig Dispense Refill   bacitracin  ointment Apply 1 Application topically daily as needed for wound care. 112 g 0   bictegravir-emtricitabine -tenofovir  AF (BIKTARVY ) 50-200-25 MG TABS tablet Take 1 tablet by mouth daily. 30 tablet 11   hydrochlorothiazide  (HYDRODIURIL ) 25 MG tablet Take 1 tablet (25 mg total) by mouth daily. 90 tablet 3   OLANZapine  (ZYPREXA ) 7.5 MG tablet Take 1 tablet (7.5 mg total) by mouth at bedtime. 30 tablet 0   pantoprazole  (PROTONIX ) 40 MG tablet Take 1 tablet (40 mg total) by mouth daily. 30 tablet 0   rosuvastatin  (CRESTOR ) 10 MG tablet Take 1 tablet (10 mg total) by mouth daily. 90 tablet 4   triamcinolone  ointment (KENALOG ) 0.5 % Apply topically 3 (three) times daily. 30 g 0    Labs  Lab Results:  Admission on 09/01/2023, Discharged on 09/06/2023  Component Date Value Ref Range Status   SARS Coronavirus 2 by RT PCR 09/02/2023 NEGATIVE  NEGATIVE Final   Performed at [facility] Lab, [address], [place], [place] [address]   Magnesium  09/02/2023 1.9  1.7 - 2.4 mg/dL Final   Performed at [facility] Lab, [address] [place], [place] [address]   Ammonia 09/02/2023 18  9 - 35 umol/L Final   Performed at [facility] Lab, [address] [place], [place] [address]   Hgb A1c MFr Bld 09/02/2023 5.5  4.8 - 5.6 % Final   Comment: (NOTE) Diagnosis of Diabetes The following HbA1c ranges recommended by the American Diabetes Association (ADA) may be used as an aid in the diagnosis of diabetes mellitus.  Hemoglobin             Suggested A1C NGSP%              Diagnosis  <5.7                   Non Diabetic  5.7-6.4                Pre-Diabetic  >6.4                   Diabetic  <7.0                   Glycemic control for                       adults with diabetes.     Mean Plasma Glucose 09/02/2023 111.15  mg/dL Final   Performed at  [facility] Lab, [address] [place], [place] [address]   Vit D, 25-Hydroxy 09/02/2023 16.73 (L)  30 - 100 ng/mL Final   Comment: (NOTE) Vitamin D  deficiency has been defined by the Institute of Medicine  and an Endocrine Society practice guideline as a level of serum 25-OH  vitamin D  less than 20 ng/mL (1,2). The Endocrine Society went on to  further define vitamin D  insufficiency as a level between 21 and 29  ng/mL (2).  1. IOM (Institute of Medicine). 2010. Dietary reference intakes for  calcium  and D. [place]: [employer]. 2. Holick MF, Binkley , Bischoff-Ferrari HA, et al. Evaluation,  treatment, and prevention of vitamin D  deficiency: an Endocrine  Society clinical practice guideline, JCEM. 2011 Jul; 96(7): 1911-30.  Performed at [facility] Lab, [address] [place], [place] [address]    Vitamin B-12 09/02/2023 292  180 - 914 pg/mL Final   Comment: (NOTE) This assay is not validated for testing neonatal or myeloproliferative syndrome specimens for Vitamin B12 levels. Performed at [facility] Lab, [address] [place], [place] [address]    Hepatitis B Surface Ag 09/02/2023 NON REACTIVE  NON REACTIVE Final   HCV Ab 09/02/2023 Reactive (A)  NON REACTIVE Final   Comment: (NOTE) The CDC recommends that a Reactive HCV antibody result be followed up  with a HCV Nucleic Acid Amplification test.     Hep A IgM 09/02/2023 NON REACTIVE  NON REACTIVE Final   Hep B C IgM 09/02/2023 NON REACTIVE  NON REACTIVE Final   Performed at [facility] Lab, [address], [place], [place] [address]   HCV RNA, Quantitation 09/04/2023 HCV Not Detected  IU/mL Final   No evidence of active HCV infection.   Test Information: 09/04/2023 Comment   Final   Comment: (NOTE) The quantitative range of this assay is 15 IU/mL to 100 million IU/mL. Performed At: [facility] [address] [place], [place] [id number] Jennette Shorter MD Ey:[id number]    Admission on 08/31/2023, Discharged on 08/31/2023  Component Date Value Ref Range Status   Sodium 08/31/2023 136  135 - 145 mmol/L Final   Potassium 08/31/2023 3.6  3.5 - 5.1 mmol/L Final   Chloride 08/31/2023 104  98 - 111 mmol/L Final   CO2 08/31/2023 25  22 - 32 mmol/L Final   Glucose, Bld 08/31/2023 88  70 - 99 mg/dL Final   Glucose reference range applies only to samples taken after fasting for at least 8 hours.   BUN 08/31/2023 13  8 - 23 mg/dL Final   Creatinine, Ser 08/31/2023 0.75  0.61 - 1.24 mg/dL Final   Calcium  08/31/2023 9.0  8.9 - 10.3 mg/dL Final   Total Protein 92/75/7974 7.9  6.5 - 8.1 g/dL Final   Albumin 92/75/7974 4.0  3.5 - 5.0 g/dL Final   AST 92/75/7974 26  15 - 41 U/L Final   ALT 08/31/2023 20  0 - 44 U/L Final   Alkaline Phosphatase 08/31/2023 64  38 - 126 U/L Final   Total Bilirubin 08/31/2023 0.7  0.0 - 1.2 mg/dL Final   GFR, Estimated 08/31/2023 >60  >60 mL/min Final   Comment: (NOTE) Calculated using the CKD-EPI Creatinine Equation (2021)    Anion gap 08/31/2023 7  5 - 15 Final   Performed at [facility] Lab, [address] [place], [place] [address]   WBC 08/31/2023 7.8  4.0 - 10.5 K/uL Final   RBC 08/31/2023 4.55  4.22 - 5.81 MIL/uL Final   Hemoglobin 08/31/2023 13.0  13.0 - 17.0 g/dL Final   HCT 92/75/7974 39.7  39.0 - 52.0 % Final   MCV 08/31/2023 87.3  80.0 - 100.0 fL Final   MCH 08/31/2023 28.6  26.0 - 34.0 pg Final  MCHC 08/31/2023 32.7  30.0 - 36.0 g/dL Final   RDW 92/75/7974 13.9  11.5 - 15.5 % Final   Platelets 08/31/2023 257  150 - 400 K/uL Final   nRBC 08/31/2023 0.0  0.0 - 0.2 % Final   Neutrophils Relative % 08/31/2023 52  % Final   Neutro Abs 08/31/2023 4.0  1.7 - 7.7 K/uL Final   Lymphocytes Relative 08/31/2023 33  % Final   Lymphs Abs 08/31/2023 2.6  0.7 - 4.0 K/uL Final   Monocytes Relative 08/31/2023 9  % Final   Monocytes Absolute 08/31/2023 0.7  0.1 - 1.0 K/uL Final   Eosinophils Relative 08/31/2023 5  % Final    Eosinophils Absolute 08/31/2023 0.4  0.0 - 0.5 K/uL Final   Basophils Relative 08/31/2023 1  % Final   Basophils Absolute 08/31/2023 0.1  0.0 - 0.1 K/uL Final   Immature Granulocytes 08/31/2023 0  % Final   Abs Immature Granulocytes 08/31/2023 0.01  0.00 - 0.07 K/uL Final   Performed at [facility] Lab, [address] [place], [place] [address]   Acetaminophen  (Tylenol ), Serum 08/31/2023 <10 (L)  10 - 30 ug/mL Final   Comment: (NOTE) Therapeutic concentrations vary significantly. A range of 10-30 ug/mL  may be an effective concentration for many patients. However, some  are best treated at concentrations outside of this range. Acetaminophen  concentrations >150 ug/mL at 4 hours after ingestion  and >50 ug/mL at 12 hours after ingestion are often associated with  toxic reactions.  Performed at [facility] Lab, [name] [place], [place] [address]    Alcohol, Ethyl (B) 08/31/2023 <15  <15 mg/dL Final   Comment: (NOTE) For medical purposes only. Performed at [facility] Lab, [address], [place], [place] [address]    Salicylate Lvl 08/31/2023 <7.0 (L)  7.0 - 30.0 mg/dL Final   Performed at [facility] Lab, [address], [place], [place] [address]   Opiates 08/31/2023 NONE DETECTED  NONE DETECTED Final   Cocaine 08/31/2023 POSITIVE (A)  NONE DETECTED Final   Benzodiazepines 08/31/2023 NONE DETECTED  NONE DETECTED Final   Amphetamines 08/31/2023 NONE DETECTED  NONE DETECTED Final   Tetrahydrocannabinol 08/31/2023 NONE DETECTED  NONE DETECTED Final   Barbiturates 08/31/2023 NONE DETECTED  NONE DETECTED Final   Comment: (NOTE) DRUG SCREEN FOR MEDICAL PURPOSES ONLY.  IF CONFIRMATION IS NEEDED FOR ANY PURPOSE, NOTIFY LAB WITHIN 5 DAYS.  LOWEST DETECTABLE LIMITS FOR URINE DRUG SCREEN Drug Class                     Cutoff (ng/mL) Amphetamine and metabolites    1000 Barbiturate and metabolites    200 Benzodiazepine                 200 Opiates and metabolites         300 Cocaine and metabolites        300 THC                            50 Performed at [facility] Lab, [address] [place], [place] [address]    Specimen Source 08/31/2023 URINE, CLEAN CATCH   Final   Color, Urine 08/31/2023 YELLOW  YELLOW Final   APPearance 08/31/2023 CLEAR  CLEAR Final   Specific Gravity, Urine 08/31/2023 1.024  1.005 - 1.030 Final   pH 08/31/2023 5.0  5.0 - 8.0 Final  Glucose, UA 08/31/2023 NEGATIVE  NEGATIVE mg/dL Final   Hgb urine dipstick 08/31/2023 NEGATIVE  NEGATIVE Final   Bilirubin Urine 08/31/2023 NEGATIVE  NEGATIVE Final   Ketones, ur 08/31/2023 NEGATIVE  NEGATIVE mg/dL Final   Protein, ur 92/75/7974 NEGATIVE  NEGATIVE mg/dL Final   Nitrite 92/75/7974 NEGATIVE  NEGATIVE Final   Leukocytes,Ua 08/31/2023 NEGATIVE  NEGATIVE Final   RBC / HPF 08/31/2023 0-5  0 - 5 RBC/hpf Final   WBC, UA 08/31/2023 0-5  0 - 5 WBC/hpf Final   Comment:        Reflex urine culture not performed if WBC <=10, OR if Squamous epithelial cells >5. If Squamous epithelial cells >5 suggest recollection.    Bacteria, UA 08/31/2023 NONE SEEN  NONE SEEN Final   Squamous Epithelial / HPF 08/31/2023 0-5  0 - 5 /HPF Final   Mucus 08/31/2023 PRESENT   Final   Performed at [facility] Lab, [address] [place], [place] [address]  Office Visit on 08/29/2023  Component Date Value Ref Range Status   WBC 08/29/2023 7.1  3.4 - 10.8 x10E3/uL Final   RBC 08/29/2023 4.78  4.14 - 5.80 x10E6/uL Final   Hemoglobin 08/29/2023 13.2  13.0 - 17.7 g/dL Final   Hematocrit 92/77/7974 42.6  37.5 - 51.0 % Final   MCV 08/29/2023 89  79 - 97 fL Final   MCH 08/29/2023 27.6  26.6 - 33.0 pg Final   MCHC 08/29/2023 31.0 (L)  31.5 - 35.7 g/dL Final   RDW 92/77/7974 13.3  11.6 - 15.4 % Final   Platelets 08/29/2023 251  150 - 450 x10E3/uL Final   Neutrophils 08/29/2023 54  Not Estab. % Final   Lymphs 08/29/2023 30  Not Estab. % Final   Monocytes 08/29/2023 11  Not Estab. % Final   Eos 08/29/2023  4  Not Estab. % Final   Basos 08/29/2023 1  Not Estab. % Final   Neutrophils Absolute 08/29/2023 3.8  1.4 - 7.0 x10E3/uL Final   Lymphocytes Absolute 08/29/2023 2.1  0.7 - 3.1 x10E3/uL Final   Monocytes Absolute 08/29/2023 0.8  0.1 - 0.9 x10E3/uL Final   EOS (ABSOLUTE) 08/29/2023 0.3  0.0 - 0.4 x10E3/uL Final   Basophils Absolute 08/29/2023 0.1  0.0 - 0.2 x10E3/uL Final   Immature Granulocytes 08/29/2023 0  Not Estab. % Final   Immature Grans (Abs) 08/29/2023 0.0  0.0 - 0.1 x10E3/uL Final   CRP 08/29/2023 4  0 - 10 mg/L Final   Sed Rate 08/29/2023 35 (H)  0 - 30 mm/hr Final   Glucose 08/29/2023 92  70 - 99 mg/dL Final   BUN 92/77/7974 14  8 - 27 mg/dL Final   Creatinine, Ser 08/29/2023 0.88  0.76 - 1.27 mg/dL Final   eGFR 92/77/7974 98  >59 mL/min/1.73 Final   BUN/Creatinine Ratio 08/29/2023 16  10 - 24 Final   Sodium 08/29/2023 140  134 - 144 mmol/L Final   Potassium 08/29/2023 4.1  3.5 - 5.2 mmol/L Final   Chloride 08/29/2023 102  96 - 106 mmol/L Final   CO2 08/29/2023 20  20 - 29 mmol/L Final   Calcium  08/29/2023 9.2  8.6 - 10.2 mg/dL Final  No results displayed because visit has over 200 results.    Office Visit on 08/10/2023  Component Date Value Ref Range Status   HIV 1 RNA Quant 08/10/2023 2,420 (H)  NOT DETECTED copies/mL Final   HIV-1 RNA Quant, Log 08/10/2023 3.38 (H)  NOT DETECTED Log copies/mL  Final   Comment: . This test was performed using Real-Time Polymerase Chain Reaction. . Reportable Range: 20 copies/mL to 10,000,000 copies/mL (1.30 log copies/mL to 7.00 log copies/mL).    WBC 08/10/2023 5.6  3.8 - 10.8 Thousand/uL Final   RBC 08/10/2023 4.98  4.20 - 5.80 Million/uL Final   Hemoglobin 08/10/2023 13.9  13.2 - 17.1 g/dL Final   HCT 92/96/7974 43.7  38.5 - 50.0 % Final   MCV 08/10/2023 87.8  80.0 - 100.0 fL Final   MCH 08/10/2023 27.9  27.0 - 33.0 pg Final   MCHC 08/10/2023 31.8 (L)  32.0 - 36.0 g/dL Final   Comment: For adults, a slight decrease in the  calculated MCHC value (in the range of 30 to 32 g/dL) is most likely not clinically significant; however, it should be interpreted with caution in correlation with other red cell parameters and the patient's clinical condition.    RDW 08/10/2023 13.2  11.0 - 15.0 % Final   Platelets 08/10/2023 313  140 - 400 Thousand/uL Final   MPV 08/10/2023 9.7  7.5 - 12.5 fL Final   Glucose, Bld 08/10/2023 96  65 - 99 mg/dL Final   Comment: .            Fasting reference interval .    BUN 08/10/2023 11  7 - 25 mg/dL Final   Creat 92/96/7974 0.77  0.70 - 1.35 mg/dL Final   BUN/Creatinine Ratio 08/10/2023 SEE NOTE:  6 - 22 (calc) Final   Comment:    Not Reported: BUN and Creatinine are within    reference range. .    Sodium 08/10/2023 140  135 - 146 mmol/L Final   Potassium 08/10/2023 3.6  3.5 - 5.3 mmol/L Final   Chloride 08/10/2023 104  98 - 110 mmol/L Final   CO2 08/10/2023 27  20 - 32 mmol/L Final   Calcium  08/10/2023 9.2  8.6 - 10.3 mg/dL Final   Total Protein 92/96/7974 7.4  6.1 - 8.1 g/dL Final   Albumin 92/96/7974 4.3  3.6 - 5.1 g/dL Final   Globulin 92/96/7974 3.1  1.9 - 3.7 g/dL (calc) Final   AG Ratio 08/10/2023 1.4  1.0 - 2.5 (calc) Final   Total Bilirubin 08/10/2023 0.3  0.2 - 1.2 mg/dL Final   Alkaline phosphatase (APISO) 08/10/2023 75  35 - 144 U/L Final   AST 08/10/2023 29  10 - 35 U/L Final   ALT 08/10/2023 27  9 - 46 U/L Final    Blood Alcohol level:  Lab Results  Component Value Date   ETH <15 08/31/2023   ETH <10 11/04/2016    Metabolic Disorder Labs: Lab Results  Component Value Date   HGBA1C 5.5 09/02/2023   MPG 111.15 09/02/2023   No results found for: PROLACTIN Lab Results  Component Value Date   CHOL 180 08/16/2023   TRIG 63 08/16/2023   HDL 45 08/16/2023   CHOLHDL 4.0 08/16/2023   VLDL 13 08/16/2023   LDLCALC 122 (H) 08/16/2023   LDLCALC 128 (H) 05/11/2022    Therapeutic Lab Levels: No results found for: LITHIUM No results found for:  VALPROATE No results found for: CBMZ  Physical Findings   PHQ2-9    Flowsheet Row ED from 09/01/2023 in [facility] Office Visit from 08/29/2023 in [facility] - A Dept Of [place]. [name] Office Visit from 11/10/2022 in [facility] - A Dept Of [place]. [facility]  [facility]ospital Office Visit from 10/26/2022 in [facility] - A Dept Of [place]. [facility] Office Visit from 09/01/2022 in [facility] - A Dept Of [place]. [facility]  PHQ-2 Total Score 0 0 0 0 0  PHQ-9 Total Score -- -- -- 0 --   Flowsheet Row ED from 09/01/2023 in [facility] Most recent reading at 09/03/2023 10:43 AM ED from 08/31/2023 in [facility] Most recent reading at 09/01/2023  1:16 AM ED from 08/31/2023 in [facility] Emergency Department at [facility] Most recent reading at 08/31/2023  6:33 PM  C-SSRS RISK CATEGORY No Risk Moderate Risk High Risk     Musculoskeletal  Strength & Muscle Tone: within normal limits Gait & Station: normal Patient leans: N/A  Psychiatric Specialty Exam  Presentation  General Appearance:  Appropriate for Environment  Eye Contact: Fair  Speech: Clear and Coherent  Speech Volume: Normal  Handedness: Right   Mood and Affect  Mood: Euthymic  Affect: Appropriate   Thought Process  Thought Processes: Goal Directed  Descriptions of Associations:Intact  Orientation:None  Thought Content:WDL  Diagnosis of Schizophrenia or Schizoaffective disorder in past: No    Hallucinations:Hallucinations: None  Ideas of Reference:None  Suicidal Thoughts:Suicidal Thoughts: No  Homicidal Thoughts:Homicidal Thoughts: No   Sensorium  Memory: Immediate Fair  Judgment: Fair  Insight: Fair   Art therapist  Concentration: Fair  Attention  Span: Fair  Recall: [employer] of Knowledge: Fair  Language: Fair  Psychomotor Activity  Psychomotor Activity: Psychomotor Activity: Normal  Assets  Assets: Communication Skills; Desire for Improvement; Housing; Social Support  Sleep  Sleep: Sleep: Fair  No Safety Checks orders active in given range  No data recorded  Physical Exam  Physical Exam ROS Blood pressure (!) 136/103, pulse 90, temperature 97.8 F (36.6 C), temperature source Oral, resp. rate 16, SpO2 99%. There is no height or weight on file to calculate BMI.  Treatment Plan Summary: Long Term Goals: Improvement in symptoms so as ready for discharge   Short Term Goals: Patient will verbalize feelings in meetings with treatment team members., Patient will attend at least of 50% of the groups daily., Pt will complete the PHQ9 on admission, day 3 and discharge., and Patient will take medications as prescribed daily.   7/29: Patient eager to discharge to brother's home tomorrow. No concerns about medications. Slept well with increase in olanzapine . Aware that he cannot get into a residential program until wounds heal. Plans to stay with brother and continue working until healed. Requests work excuse at discharge tomorrow. He needs prescriptions and typically uses [facility].    Medications: Mood/anxiety: continue group therapy, milieu therapy, 1:1 evaluation with provider.  Medication management: increase olanzapine  to 7.5mg  PO at bedtime for hallucinations, changed remeron  7.5 mg PO at bedtime to PRN due to patient falling aslepe during the day for mood, anxiety and sleep  Substance Abuse:  stimulant use: encourage patient to maintain PO intake. Coverage PRN for secondary psychosis.  Alcohol use disorder: Replacing Thiamine . CIWA monitoring with benzodiazepine coverage per withdrawal scoring. Monitoring HR and BP.  Nicotine  and tobacco use: N/A Medical: PRNs for pain, constipation,  indigestion available.  Labs/studies: vitamin D  low, starting ergocalciferol  50 000 units PO weekly.  HCV +, RNA pending.  Safety and Monitoring: voluntarily admission to [facility]/Facility based care unit [facility]) unit for safety, stabilization and treatment Daily contact with patient to  assess and evaluate symptoms and progress in treatment Patient's case to be discussed in multi-disciplinary team meeting Observation Level : q15 minute checks Vital signs: q12 hours Precautions: withdrawal and fall  Anticipate discharge to community tomorrow.  KANDI JAYSON HAHN, MD 09/05/2023 12:44 PM

## 2023-09-06 NOTE — ED Notes (Signed)
 Patient is in a deep sleep. Calm and quiet environment.

## 2023-09-06 NOTE — Assessment & Plan Note (Signed)
 Somewhat hypotensive today with orthostatic changes and tachycardia. He's hypovolemic from losses during time spent in the sun. Discontinue hydrochlorothiazide  for now. Return to clinic in 1 week for follow-up.  Medications Discontinued During This Encounter  Medication Reason   hydrochlorothiazide  (HYDRODIURIL ) 25 MG tablet

## 2023-09-06 NOTE — ED Notes (Signed)
Patient still sleeping

## 2023-09-06 NOTE — ED Provider Notes (Signed)
 FBC/OBS ASAP Discharge Summary  Date and Time: 09/06/2023 9:57 AM  Name: William [name]  MRN:  [id number]   Discharge Diagnoses:  Final diagnoses:  Alcohol-induced mood disorder (HCC)  Cocaine abuse with cocaine-induced mood disorder (HCC)  Alcohol dependence with uncomplicated withdrawal (HCC)    Subjective: Patient reports good mood and he is future orietned on discharge to brother's house today. Patient feels more hopeful and denies SI, HI or AVH. Patient reports fair sleep and appetite. Reports pain has decreased.  Stay Summary:   Patient was admitted to inpatient psychiatry at [facility] for safety and stabilization. Patient was provided safe and therapeutic milieu, psychiatric and medical assessment, care and treatment, as well as support from nursing, behavioral health staff. Both psychotherapy and psychoeducation groups were provided. Different coping skills such as journaling, CBT and art therapy groups were offered. Additional consultation was provided by hospitalist for H&P and medical needs.  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 Patient tolerated without side effects and medications were titrated to therapeutic effect. As patient stabilized on medications and participated in therapeutic interventions, symptoms began to improve.  On the day of discharge, the chart was reviewed, case was discussed with staff and the patient was seen in person. Patient's overall mood has improved. I want to go back to my brothers.  Patient was calm and cooperative and did not appear anxious. Patient reported adequate sleep and stable mood. Patient was tolerating medications well without side effects reported or noted. Patient denied suicidal ideation, plan or intent, denied hopelessness, helplessness or worthlessness, and denied homicidal ideation. Insight and judgement have improved. Patient demonstrated future  orientation and was motivated to follow-up with aftercare. Patient was encouraged to be adherent with medications. Patient was instructed to call 911, ask for help to go to the closest emergency room or crisis center, call crisis hotlines for help if in critical status or when symptoms were worsening. Patient voiced understanding of this information. At the time of discharge, patient had reached maximum benefit from hospitalization, was no longer considered to be dangerous to self or others, and was psychiatrically stable and otherwise appropriate for discharge to less restrictive care in the community.   Medical Hospital Course: Patient was seen by the hospitalist for routine admission examination. Medications for chronic conditions were continued.  Medical hospital course was otherwise unremarkable.  Patient was encouraged to followup with ID as had been recommended by PCP recently and after recent hospital admission due to wounds on feet.   Total Time spent with patient: 30 minutes  Past Psychiatric History: previously diagnosed with alcohol use disorder.  No previous suicide attempts.   Past Medical History: HIV, hypertension, history of hepatitis C, history of pneumothorax, GERD, hyperlipidemia and sepsis related to cellulitis of the lower extremities  Family History: adopted Social History: mother died recently. Patient living with brother. Using alcohol since teens, Tobacco Cessation:  A prescription for an FDA-approved tobacco cessation medication provided at discharge  Current Medications:  Current Facility-Administered Medications  Medication Dose Route Frequency Provider Last Rate Last Admin   acetaminophen  (TYLENOL ) tablet 650 mg  650 mg Oral Q6H PRN Lera Golas B, DO       alum & mag hydroxide-simeth (MAALOX/MYLANTA) 200-200-20 MG/5ML suspension 30 mL  30 mL Oral Q4H PRN Prunty, Donald B, DO       bacitracin  ointment 1 Application  1 Application Topical Daily PRN Onuoha, Chinwendu  V, NP  bictegravir-emtricitabine -tenofovir  AF (BIKTARVY ) 50-200-25 MG per tablet 1 tablet  1 tablet Oral Daily Lera Golas B, DO   1 tablet at 09/05/23 9092   haloperidol  lactate (HALDOL ) injection 5 mg  5 mg Intramuscular TID PRN Lera Golas B, DO       And   diphenhydrAMINE  (BENADRYL ) injection 50 mg  50 mg Intramuscular TID PRN Lera Golas B, DO       And   LORazepam  (ATIVAN ) injection 2 mg  2 mg Intramuscular TID PRN Prunty, Donald B, DO       hydrochlorothiazide  (HYDRODIURIL ) tablet 25 mg  25 mg Oral Daily Prunty, Donald B, DO   25 mg at 09/05/23 9092   ketoconazole  (NIZORAL ) 2 % cream   Topical Daily Leigh Corean Massa, MD   Given at 09/05/23 9090   magnesium  hydroxide (MILK OF MAGNESIA) suspension 30 mL  30 mL Oral Daily PRN Lera Golas B, DO       mirtazapine  (REMERON ) tablet 7.5 mg  7.5 mg Oral QHS PRN Ajwa Kimberley, MD       OLANZapine  (ZYPREXA ) tablet 7.5 mg  7.5 mg Oral QHS Emrey Thornley, MD   7.5 mg at 09/05/23 2204   OLANZapine  zydis (ZYPREXA ) disintegrating tablet 5 mg  5 mg Oral TID PRN Lera Golas B, DO       pantoprazole  (PROTONIX ) EC tablet 40 mg  40 mg Oral Daily Hill, Corean Massa, MD   40 mg at 09/05/23 9092   traZODone  (DESYREL ) tablet 50 mg  50 mg Oral QHS PRN Prunty, Donald B, DO   50 mg at 09/04/23 2139   Vitamin D  (Ergocalciferol ) (DRISDOL ) 1.25 MG (50000 UNIT) capsule 50,000 Units  50,000 Units Oral Q7 days Leigh Corean Massa, MD   50,000 Units at 09/03/23 1359   Current Outpatient Medications  Medication Sig Dispense Refill   bacitracin  ointment Apply 1 Application topically daily as needed for wound care. 120 g 0   bictegravir-emtricitabine -tenofovir  AF (BIKTARVY ) 50-200-25 MG TABS tablet Take 1 tablet by mouth daily. 30 tablet 11   hydrochlorothiazide  (HYDRODIURIL ) 25 MG tablet Take 1 tablet (25 mg total) by mouth daily. 90 tablet 3   OLANZapine  (ZYPREXA ) 7.5 MG tablet Take 1 tablet (7.5 mg total) by mouth at bedtime. 30 tablet 0    pantoprazole  (PROTONIX ) 40 MG tablet Take 1 tablet (40 mg total) by mouth daily. 30 tablet 0   rosuvastatin  (CRESTOR ) 10 MG tablet Take 1 tablet (10 mg total) by mouth daily. 90 tablet 4   triamcinolone  ointment (KENALOG ) 0.5 % Apply topically 3 (three) times daily. 30 g 0    PTA Medications:  Facility Ordered Medications  Medication   [COMPLETED] thiamine  (VITAMIN B1) injection 100 mg   bictegravir-emtricitabine -tenofovir  AF (BIKTARVY ) 50-200-25 MG per tablet 1 tablet   [EXPIRED] chlordiazePOXIDE  (LIBRIUM ) capsule 25 mg   [EXPIRED] dicyclomine  (BENTYL ) tablet 20 mg   hydrochlorothiazide  (HYDRODIURIL ) tablet 25 mg   [EXPIRED] hydrOXYzine  (ATARAX ) tablet 25 mg   [EXPIRED] loperamide  (IMODIUM ) capsule 2-4 mg   [EXPIRED] methocarbamol  (ROBAXIN ) tablet 500 mg   [EXPIRED] ondansetron  (ZOFRAN -ODT) disintegrating tablet 4 mg   acetaminophen  (TYLENOL ) tablet 650 mg   alum & mag hydroxide-simeth (MAALOX/MYLANTA) 200-200-20 MG/5ML suspension 30 mL   magnesium  hydroxide (MILK OF MAGNESIA) suspension 30 mL   OLANZapine  zydis (ZYPREXA ) disintegrating tablet 5 mg   haloperidol  lactate (HALDOL ) injection 5 mg   And   diphenhydrAMINE  (BENADRYL ) injection 50 mg   And   LORazepam  (ATIVAN ) injection 2 mg  traZODone  (DESYREL ) tablet 50 mg   pantoprazole  (PROTONIX ) EC tablet 40 mg   ketoconazole  (NIZORAL ) 2 % cream   Vitamin D  (Ergocalciferol ) (DRISDOL ) 1.25 MG (50000 UNIT) capsule 50,000 Units   mirtazapine  (REMERON ) tablet 7.5 mg   OLANZapine  (ZYPREXA ) tablet 7.5 mg   bacitracin  ointment 1 Application   PTA Medications  Medication Sig   triamcinolone  ointment (KENALOG ) 0.5 % Apply topically 3 (three) times daily.   rosuvastatin  (CRESTOR ) 10 MG tablet Take 1 tablet (10 mg total) by mouth daily.   hydrochlorothiazide  (HYDRODIURIL ) 25 MG tablet Take 1 tablet (25 mg total) by mouth daily.   bictegravir-emtricitabine -tenofovir  AF (BIKTARVY ) 50-200-25 MG TABS tablet Take 1 tablet by mouth daily.    OLANZapine  (ZYPREXA ) 7.5 MG tablet Take 1 tablet (7.5 mg total) by mouth at bedtime.   pantoprazole  (PROTONIX ) 40 MG tablet Take 1 tablet (40 mg total) by mouth daily.   bacitracin  ointment Apply 1 Application topically daily as needed for wound care.       09/06/2023    9:57 AM 08/29/2023   10:11 AM 11/10/2022   10:21 AM  Depression screen PHQ 2/9  Decreased Interest 0 0 0  Down, Depressed, Hopeless 0 0 0  PHQ - 2 Score 0 0 0    Flowsheet Row ED from 09/01/2023 in [facility] Most recent reading at 09/03/2023 10:43 AM ED from 08/31/2023 in [facility] Most recent reading at 09/01/2023  1:16 AM ED from 08/31/2023 in [facility] Emergency Department at [facility] Most recent reading at 08/31/2023  6:33 PM  C-SSRS RISK CATEGORY No Risk Moderate Risk High Risk    Musculoskeletal  Strength & Muscle Tone: within normal limits Gait & Station: normal Patient leans: N/A  Psychiatric Specialty Exam  Presentation  General Appearance:  Appropriate for Environment  Eye Contact: Fair  Speech: Clear and Coherent  Speech Volume: Normal  Handedness: Right   Mood and Affect  Mood: Euthymic  Affect: Appropriate   Thought Process  Thought Processes: Goal Directed  Descriptions of Associations:Intact  Orientation:None  Thought Content:WDL  Diagnosis of Schizophrenia or Schizoaffective disorder in past: No    Hallucinations:Hallucinations: None  Ideas of Reference:None  Suicidal Thoughts:Suicidal Thoughts: No  Homicidal Thoughts:Homicidal Thoughts: No   Sensorium  Memory: Immediate Fair  Judgment: Fair  Insight: Fair   Art therapist  Concentration: Fair  Attention Span: Fair  Recall: [employer] of Knowledge: Fair  Language: Fair   Psychomotor Activity  Psychomotor Activity: Psychomotor Activity: Normal   Assets  Assets: Communication Skills; Desire for Improvement;  Housing; Social Support   Sleep  Sleep: Sleep: Fair  No Safety Checks orders active in given range  No data recorded  Physical Exam   General: Well developed, well nourished, African [place] male Pupils: Normal at 3mm Respiratory: Breathing is unlabored.  Cardiovascular: No edema.  Language: No anomia, no aphasia Muscle strength and tone-pt moving all extremities.  Gait not assessed as pt remained in bed.  Neuro: Facial muscles are symmetric. Pt without tremor, no evidence of hyperarousal.  Review of Systems  Constitutional: Negative.   HENT: Negative.    Eyes: Negative.   Respiratory: Negative.    Cardiovascular: Negative.   Gastrointestinal: Negative.   Genitourinary: Negative.   Musculoskeletal:  Positive for myalgias.  Skin: Negative.   Neurological: Negative.   Endo/Heme/Allergies: Negative.   Psychiatric/Behavioral: Negative.     Blood pressure (!) 136/103, pulse 90, temperature 97.8 F (36.6 C), temperature  source Oral, resp. rate 16, SpO2 99%. There is no height or weight on file to calculate BMI.  Demographic Factors:  Male, Low socioeconomic status, and Unemployed  Loss Factors: Decline in physical health and Financial problems/change in socioeconomic status  Historical Factors: NA  Risk Reduction Factors:   Living with another person, especially a relative, Positive social support, Positive therapeutic relationship, and Positive coping skills or problem solving skills  Continued Clinical Symptoms:  Alcohol/Substance Abuse/Dependencies  Cognitive Features That Contribute To Risk:  None    Suicide Risk:  Minimal: No identifiable suicidal ideation.  Patients presenting with no risk factors but with morbid ruminations; may be classified as minimal risk based on the severity of the depressive symptoms  Patient denies SI or HI for >48 hours. Denies wanting to be dead and future oriented. SRA complete and acute risk for suicide is low.   [place]  Suicide Risk assessment:  1. Do you wish to be dead? NONE REPORTED 2. Have you wished your dead or wished you could go to sleep and not wake up? NONE REPORTED 3.  Have you actually had thoughts of killing yourself?  NONE REPORTED 4.  Have you been thinking about how you might do this?  NONE REPORTED 5.  Have you had these thoughts and some intention of acting on them? NONE REPORTED 6.  Have you started to work out or worked out the details to kill yourself? NONE REPORTED 7.  Do you intend to carry out this plan? NONE REPORTED 8. On a scale of 1-5 with 1 being the least severe and 5 being the most severe answer the following questions place for intensity of ideation. ZERO 9. How many times have you had these thoughts? NONE REPORTED 10. When you have the thoughts how long to the last?  NONE REPORTED 11. Control ability.  Could you or can you stop thinking about killing herself or wanting to die if you want to?  YES 12. Are there any things anyone or anything family religion pain of death that stop you from wanting to die or acting on thoughts of committing suicide?  FAMILY 13.  What sort of reason to do have to think about wanting to die or killing yourself? NONE REPORTED 14.Was it to end the pain or stop the way you are feeling in other words you could not go on living with his pain or how you are feeling or was not to get attention revenge or reaction from others?  Or both?  NONE REPORTED  [place] Suicide Risk assessment:  1. Do you wish to be dead? NONE REPORTED 2. Have you wished your dead or wished you could go to sleep and not wake up? NONE REPORTED 3.  Have you actually had thoughts of killing yourself?  NONE REPORTED 4.  Have you been thinking about how you might do this?  NONE REPORTED 5.  Have you had these thoughts and some intention of acting on them? NONE REPORTED 6.  Have you started to work out or worked out the details to kill yourself? NONE REPORTED 7.  Do you intend to carry  out this plan? NONE REPORTED 8. On a scale of 1-5 with 1 being the least severe and 5 being the most severe answer the following questions place for intensity of ideation. ZERO 9. How many times have you had these thoughts? NONE REPORTED 10. When you have the thoughts how long to the last?  NONE REPORTED 11. Control ability.  Could  you or can you stop thinking about killing herself or wanting to die if you want to?  YES 12. Are there any things anyone or anything family religion pain of death that stop you from wanting to die or acting on thoughts of committing suicide?  FAMILY 13.  What sort of reason to do have to think about wanting to die or killing yourself? NONE REPORTED 14.Was it to end the pain or stop the way you are feeling in other words you could not go on living with his pain or how you are feeling or was not to get attention revenge or reaction from others?  Or both?  NONE REPORTED   Plan Of Care/Follow-up recommendations:  Outpatient psychiatric followup Patient is aware he is to followup with PCP and ID this week and has completed antibiotic course  Analia Zuk, MD 09/06/2023, 9:57 AM

## 2023-09-06 NOTE — Patient Instructions (Addendum)
 Return in about 1 week (around 09/13/2023) for low blood pressure.  Stop taking your blood pressure medicine hydrochlorothiazide .  Remember to bring all of the medications that you take (including over the counter medications and supplements) with you to every clinic visit.  This after visit summary is an important review of tests, referrals, and medication changes that were discussed during your visit. If you have questions or concerns, call [phone]. Outside of clinic business hours, call the main hospital at [phone] and ask the operator for the on-call internal medicine resident.   Ozell Kung MD 09/06/2023, 4:10 PM

## 2023-09-06 NOTE — Assessment & Plan Note (Signed)
 Improving, now off antibiotics, has appointment for wound care tomorrow.

## 2023-09-06 NOTE — ED Notes (Signed)
 Discharge paperwork discussed with pt including AVS. questions denied. Pt provided with bus pass. Pt was escorted off unit with staff. Items returned.

## 2023-09-06 NOTE — ED Notes (Signed)
 Pt reported he is doing well. No sleep disturbances reported. He eats adequate. Denied N/V. Denied suicidal ideation. Denied audio visual hallucination. He said, I am not sweating anymore. Hygiene maintained. Independent for activity of daily living Med complaint. No issue noted this evening.

## 2023-09-06 NOTE — ED Notes (Signed)
 Pt says he feels 'okay', but also reports some anxiety over going back to work. Pt denies physical pain and discomfort. Pt ate breakfast, denies si hi avh - verbal contract for safety provided.

## 2023-09-06 NOTE — Telephone Encounter (Signed)
 Received a call from Jessica,medical records, with [facility], asking if Dr Rosan will be signing off HH orders for skilled nursing.; informed her Dr Harrie is the PCP (resident)and Dr Rosan is the Attending. Per chart, pt is being discharged from [facility] today.

## 2023-09-06 NOTE — Telephone Encounter (Signed)
 Walk in. Stated he needs a letter to return to work. Recent discharged from [facility]. LOV - 08/29/23. Informed pt he will need a appt - appt given today @ 1515 PM w/Dr Norrine.

## 2023-09-07 ENCOUNTER — Other Ambulatory Visit (HOSPITAL_COMMUNITY): Payer: Self-pay

## 2023-09-07 ENCOUNTER — Encounter: Payer: Self-pay | Admitting: Pharmacist

## 2023-09-07 ENCOUNTER — Other Ambulatory Visit: Payer: Self-pay

## 2023-09-12 ENCOUNTER — Other Ambulatory Visit: Payer: Self-pay

## 2023-09-12 ENCOUNTER — Telehealth: Payer: Self-pay | Admitting: *Deleted

## 2023-09-12 NOTE — Telephone Encounter (Unsigned)
 Copied from CRM [id number]. Topic: Clinical - Home Health Verbal Orders >> Sep 12, 2023  3:00 PM Shamecia H wrote: Caller/Agency: Ted Margarito Rushing Number: [phone] The patient has been seen twice and after that he hasn't been seen. He's never available for a home visit and the patients brother always states that he is out. Ted from [facility] is wanting to discharge the patient. Could you please give her a callback

## 2023-09-13 ENCOUNTER — Encounter: Admitting: Student

## 2023-09-15 ENCOUNTER — Other Ambulatory Visit: Payer: Self-pay

## 2023-09-15 NOTE — Telephone Encounter (Signed)
 Ted Curtis RN with [facility] stated they have not been able to see pt since he's never at home. She did see and speak to pt's brother who stated pt is not allowed to drink at home so he goes out to drink. Stated hey need a verbal order from the doctor to discharge pt.

## 2023-09-18 ENCOUNTER — Other Ambulatory Visit: Payer: Self-pay

## 2023-09-18 ENCOUNTER — Ambulatory Visit: Admitting: Student

## 2023-09-18 VITALS — BP 125/78 | HR 75 | Temp 97.8°F | Ht 71.0 in | Wt 191.4 lb

## 2023-09-18 DIAGNOSIS — I10 Essential (primary) hypertension: Secondary | ICD-10-CM | POA: Diagnosis not present

## 2023-09-18 DIAGNOSIS — B20 Human immunodeficiency virus [HIV] disease: Secondary | ICD-10-CM

## 2023-09-18 NOTE — Assessment & Plan Note (Signed)
 The 10-year ASCVD risk score (Arnett DK, et al., 2019) is: 8.2%   Values used to calculate the score:     Age: 63 years     Clincally relevant sex: Male     Is Non-Hispanic African American: Yes     Diabetic: No     Tobacco smoker: No     Systolic Blood Pressure: 125 mmHg     Is BP treated: No     HDL Cholesterol: 45 mg/dL     Total Cholesterol: 180 mg/dL   Continue rosuvastatin  10 mg daily.

## 2023-09-18 NOTE — Patient Instructions (Addendum)
 Return in about 3 months (around 12/19/2023) for preventive health visit.   Remember to bring all of the medications that you take (including over the counter medications and supplements) with you to every clinic visit.  This after visit summary is an important review of tests, referrals, and medication changes that were discussed during your visit. If you have questions or concerns, call [phone]. Outside of clinic business hours, call the main hospital at [phone] and ask the operator for the on-call internal medicine resident.   Ozell Kung MD 09/18/2023, 10:51 AM

## 2023-09-18 NOTE — Assessment & Plan Note (Signed)
 BP Readings from Last 3 Encounters:  09/18/23 125/78  09/06/23 101/68  09/06/23 (!) 136/103  Blood pressure is normal off of his antihypertensive medicine.

## 2023-09-18 NOTE — Telephone Encounter (Signed)
 Called Cecilia RN - no answer. Left message on vm ok to discharge pt from their service d/t pt not being at home per Dr Napoleon.

## 2023-09-18 NOTE — Assessment & Plan Note (Signed)
 Reports good adherence to Biktarvy , although does note some difficulty getting his medications of late because of problems getting to the pharmacy.  I encouraged him to walk across the hall to the [facility] community pharmacy in this building to have all of his scripts transferred here from the [facility] across town.

## 2023-09-18 NOTE — Progress Notes (Signed)
   Patient name: William [name] Date of birth: 04-15-1960 Date of visit: 09/18/23  Subjective   Chief concern: blood pressure follow-up  At last visit he had signs of orthostatic hypotension.  His blood pressure medicine was stopped.  Since then he has been doing well.  No dizziness when standing.  He has been avoiding alcohol and drugs.  Review of Systems  Neurological:  Negative for headaches.    Current Outpatient Medications  Medication Instructions   bacitracin  ointment 1 Application, Topical, Daily PRN   bictegravir-emtricitabine -tenofovir  AF (BIKTARVY ) 50-200-25 MG TABS tablet 1 tablet, Oral, Daily   OLANZapine  (ZYPREXA ) 7.5 mg, Oral, Daily at bedtime   pantoprazole  (PROTONIX ) 40 mg, Oral, Daily   rosuvastatin  (CRESTOR ) 10 mg, Oral, Daily   triamcinolone  ointment (KENALOG ) 0.5 % Topical, 3 times daily     Objective  Today's Vitals   09/18/23 1015  BP: 125/78  Pulse: 75  Temp: 97.8 F (36.6 C)  TempSrc: Oral  SpO2: 97%  Weight: 191 lb 6.4 oz (86.8 kg)  Height: 5' 11 (1.803 m)  PainSc: 0-No pain  Body mass index is 26.69 kg/m.   Physical Exam Constitutional:      Appearance: Normal appearance.  Pulmonary:     Effort: Pulmonary effort is normal. No respiratory distress.  Skin:    General: Skin is warm and dry.  Neurological:     Mental Status: He is alert.     Cranial Nerves: No facial asymmetry.  Psychiatric:        Mood and Affect: Affect normal.        Speech: Speech normal.        Behavior: Behavior normal.      Assessment & Plan   Primary hypertension Assessment & Plan: BP Readings from Last 3 Encounters:  09/18/23 125/78  09/06/23 101/68  09/06/23 (!) 136/103  Blood pressure is normal off of his antihypertensive medicine.   Human immunodeficiency virus (HIV) disease (HCC) Assessment & Plan: Reports good adherence to Biktarvy , although does note some difficulty getting his medications of late because of problems getting to the  pharmacy.  I encouraged him to walk across the hall to the [facility] community pharmacy in this building to have all of his scripts transferred here from the [facility] across town.     Return in about 3 months (around 12/19/2023) for preventive health visit.  Ozell Kung MD 09/18/2023, 1:27 PM

## 2023-09-18 NOTE — Progress Notes (Signed)
  Patient name: William [name] Date of birth: May 22, 1960 Date of visit: 09/18/23  Subjective   Chief concern: blood pressure follow-up  At last visit he had signs of orthostatic hypotension.  His blood pressure medicine was stopped.  Since then he has been doing well.  No dizziness when standing.  He has been avoiding alcohol and drugs.  Review of Systems  Neurological:  Negative for headaches.    Current Outpatient Medications  Medication Instructions   bacitracin  ointment 1 Application, Topical, Daily PRN   bictegravir-emtricitabine -tenofovir  AF (BIKTARVY ) 50-200-25 MG TABS tablet 1 tablet, Oral, Daily   OLANZapine  (ZYPREXA ) 7.5 mg, Oral, Daily at bedtime   pantoprazole  (PROTONIX ) 40 mg, Oral, Daily   rosuvastatin  (CRESTOR ) 10 mg, Oral, Daily   triamcinolone  ointment (KENALOG ) 0.5 % Topical, 3 times daily     Objective  Today's Vitals   09/18/23 1015  BP: 125/78  Pulse: 75  Temp: 97.8 F (36.6 C)  TempSrc: Oral  SpO2: 97%  Weight: 191 lb 6.4 oz (86.8 kg)  Height: 5' 11 (1.803 m)  PainSc: 0-No pain  Body mass index is 26.69 kg/m.   Physical Exam Constitutional:      Appearance: Normal appearance.  Pulmonary:     Effort: Pulmonary effort is normal. No respiratory distress.  Skin:    General: Skin is warm and dry.  Neurological:     Mental Status: He is alert.     Cranial Nerves: No facial asymmetry.  Psychiatric:        Mood and Affect: Affect normal.        Speech: Speech normal.        Behavior: Behavior normal.      Assessment & Plan   Primary hypertension Assessment & Plan: BP Readings from Last 3 Encounters:  09/18/23 125/78  09/06/23 101/68  09/06/23 (!) 136/103  Blood pressure is normal off of his antihypertensive medicine.   Human immunodeficiency virus (HIV) disease (HCC) Assessment & Plan: Reports good adherence to Biktarvy , although does note some difficulty getting his medications of late because of problems getting to the  pharmacy.  I encouraged him to walk across the hall to the [facility] community pharmacy in this building to have all of his scripts transferred here from the [facility] across town.     Return in about 3 months (around 12/19/2023) for preventive health visit.  Ozell Kung MD 09/18/2023, 1:27 PM

## 2023-09-19 ENCOUNTER — Other Ambulatory Visit (HOSPITAL_COMMUNITY): Payer: Self-pay

## 2023-09-19 ENCOUNTER — Other Ambulatory Visit: Payer: Self-pay

## 2023-09-19 NOTE — Progress Notes (Signed)
 Internal Medicine Clinic Attending  Case discussed with the resident at the time of the visit.  We reviewed the resident's history and exam and pertinent patient test results.  I agree with the assessment, diagnosis, and plan of care documented in the resident's note.

## 2023-09-19 NOTE — Addendum Note (Signed)
 Addended by: KARNA FELLOWS on: 09/19/2023 03:46 PM   Modules accepted: Level of Service

## 2023-09-25 ENCOUNTER — Inpatient Hospital Stay: Payer: Self-pay | Admitting: Internal Medicine

## 2023-09-28 NOTE — Progress Notes (Signed)
 Internal Medicine Clinic Attending  Case discussed with the resident at the time of the visit.  We reviewed the resident's history and exam and pertinent patient test results.  I agree with the assessment, diagnosis, and plan of care documented in the resident's note.

## 2023-10-24 ENCOUNTER — Other Ambulatory Visit (HOSPITAL_COMMUNITY): Payer: Self-pay

## 2023-10-24 ENCOUNTER — Other Ambulatory Visit: Payer: Self-pay

## 2023-10-24 NOTE — Progress Notes (Signed)
 Specialty Pharmacy Refill Coordination Note  William [name] is a 63 y.o. male contacted today regarding refills of specialty medication(s) Bictegravir-Emtricitab-Tenofov (BIKTARVY )   Patient requested Delivery   Delivery date: 10/25/23   Verified address: [address] [place] [address]   Medication will be filled on 10/24/23.

## 2023-11-15 ENCOUNTER — Other Ambulatory Visit: Payer: Self-pay

## 2023-11-16 ENCOUNTER — Other Ambulatory Visit: Payer: Self-pay

## 2023-11-16 ENCOUNTER — Telehealth (HOSPITAL_COMMUNITY): Payer: Self-pay | Admitting: Licensed Clinical Social Worker

## 2023-11-16 NOTE — Telephone Encounter (Signed)
 The therapist receives a call from Ms. Viviana Hancock with the [facility] who is trying to confirm if this patient is attending IOP at [facility]. She sends a ROI to this therapist who confirms that he is not attending IOP at [facility] with Ms. Hancock saying that she has suspected he might be lying.  Zell Maier, MA, LCSW, [facility], LCAS 11/16/2023

## 2023-11-20 ENCOUNTER — Other Ambulatory Visit: Payer: Self-pay

## 2023-11-21 ENCOUNTER — Other Ambulatory Visit: Payer: Self-pay

## 2023-11-22 ENCOUNTER — Other Ambulatory Visit (HOSPITAL_COMMUNITY): Payer: Self-pay

## 2023-11-22 ENCOUNTER — Other Ambulatory Visit: Payer: Self-pay

## 2023-11-23 ENCOUNTER — Other Ambulatory Visit (HOSPITAL_COMMUNITY): Payer: Self-pay

## 2023-11-23 ENCOUNTER — Other Ambulatory Visit: Payer: Self-pay

## 2023-11-23 NOTE — Progress Notes (Signed)
 Specialty Pharmacy Refill Coordination Note  William [name] is a 63 y.o. male contacted today regarding refills of specialty medication(s) Bictegravir-Emtricitab-Tenofov (BIKTARVY )   Patient requested Delivery   Delivery date: 11/24/23   Verified address: [address] [name] [address]   Medication will be filled on 11/23/23.

## 2023-11-23 NOTE — Progress Notes (Signed)
 Specialty Pharmacy Ongoing Clinical Assessment Note  William [name] is a 63 y.o. male who is being followed by the specialty pharmacy service for RxSp HIV   Patient's specialty medication(s) reviewed today: Bictegravir-Emtricitab-Tenofov (BIKTARVY )   Missed doses in the last 4 weeks: 0   Patient/Caregiver did not have any additional questions or concerns.   Therapeutic benefit summary: Patient is achieving benefit   Adverse events/side effects summary: No adverse events/side effects   Patient's therapy is appropriate to: Continue    Goals Addressed             This Visit's Progress    Achieve Undetectable HIV Viral Load < 20   On track    Patient is on track. Patient will maintain adherence. VL was 60 on 08/17/23.      Increase CD4 count until steady state   On track    Patient is on track. Patient will maintain adherence      Maintain optimal adherence to therapy   On track    Patient is on track. Patient will maintain adherence         Follow up: 12 months  Favor Hackler M Izzie Geers Specialty Pharmacist

## 2023-12-11 ENCOUNTER — Other Ambulatory Visit (HOSPITAL_COMMUNITY): Payer: Self-pay

## 2023-12-14 ENCOUNTER — Other Ambulatory Visit: Payer: Self-pay

## 2023-12-18 ENCOUNTER — Other Ambulatory Visit: Payer: Self-pay

## 2023-12-18 NOTE — Progress Notes (Deleted)
 Patient name: William [name] Date of birth: May 13, 1960 Date of visit: 12/18/23  Type of visit: Established Patient Office Visit  Subjective   Chief concern: No chief complaint on file.   William [name] is a 63 y.o. male with a PMHx of HIV on Biktarvy  (last CD4 288, VL 2420 on 08/16/23), polysubstance use disorder (cocaine, alcohol, tobacco), GERD, HTN, HLD, vitamin D  deficiency who presents to [facility] clinic {imcrsn:[id number]}     Patient Active Problem List   Diagnosis Date Noted   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   Cocaine abuse with cocaine-induced mood disorder (HCC) 09/05/2023   Hepatitis C antibody detected 09/03/2023   Cocaine dependence (HCC) 09/02/2023   Dilatation of aorta ~2mm  (HCC) 08/18/2023   Staphylococcus aureus bacteremia 08/17/2023   Cellulitis of right leg 08/16/2023   Healthcare maintenance 07/14/2023   Cellulitis of right lower extremity 02/12/2023   Eczematous dermatitis 02/06/2023   Skin ulcers of foot, bilateral (HCC), R> L, painful 02/05/2023   Pure hypercholesterolemia 09/01/2022   Dysphagia 01/12/2021   Gastritis 01/12/2021   Screening for colon cancer 05/25/2020   Hypertension 03/27/2019   GERD with esophagitis 09/01/2016   Cigarette smoker 12/23/2014   Seasonal allergies 01/22/2013   Human immunodeficiency virus (HIV) disease (HCC) 07/10/2006   Alcohol use disorder, moderate, dependence (HCC) 07/10/2006     Past Surgical History:  Procedure Laterality Date   CHEST TUBE INSERTION     ESOPHAGOGASTRODUODENOSCOPY (EGD) WITH PROPOFOL  N/A 08/29/2016   Procedure: ESOPHAGOGASTRODUODENOSCOPY (EGD) WITH PROPOFOL ;  Surgeon: Elicia Claw, MD;  Location: [facility] END[facility]COPY;  Service: Gastroenterology;  Laterality: N/A;   MULTIPLE EXTRACTIONS WITH ALVEOLOPLASTY N/A 05/23/2014   Procedure: EXTRACTIONS OF ALL REMAINING TEETH WITH ALVEOLOPLASTY, REMOVAL OF LEFT OSSEOUS EXOTOSIS, REMOVAL MAXILLARY OSSEOUS TUBEROSITY;  Surgeon: Glendia Primrose, DDS;   Location: [facility] OR;  Service: Oral Surgery;  Laterality: N/A;   PLEURADESIS Left 04/26/2013   Procedure: PLEURADESIS;  Surgeon: Dorise MARLA Fellers, MD;  Location: [facility] OR;  Service: Thoracic;  Laterality: Left;  Mechanical   STAPLING OF BLEBS Left 04/26/2013   Procedure: STAPLING OF BLEBS;  Surgeon: Dorise MARLA Fellers, MD;  Location: [facility] OR;  Service: Thoracic;  Laterality: Left;   TRANSESOPHAGEAL ECHOCARDIOGRAM (CATH LAB) N/A 08/22/2023   Procedure: TRANSESOPHAGEAL ECHOCARDIOGRAM;  Surgeon: Delford Maude BROCKS, MD;  Location: [facility] INVASIVE CV LAB;  Service: Cardiovascular;  Laterality: N/A;   VIDEO ASSISTED THORACOSCOPY Left 04/26/2013   Procedure: VIDEO ASSISTED THORACOSCOPY;  Surgeon: Dorise MARLA Fellers, MD;  Location: [facility] OR;  Service: Thoracic;  Laterality: Left;    ROS negative unless otherwise indicated in the HPI or Assessment and Plan.  Current Outpatient Medications  Medication Instructions   bictegravir-emtricitabine -tenofovir  AF (BIKTARVY ) 50-200-25 MG TABS tablet 1 tablet, Oral, Daily   rosuvastatin  (CRESTOR ) 10 mg, Oral, Daily    Social History   Tobacco Use   Smoking status: Former    Current packs/day: 0.00    Average packs/day: 0.3 packs/day    Types: Cigarettes    Start date: 04/08/2018    Quit date: 04/09/2018    Years since quitting: 5.6    Passive exposure: Past   Smokeless tobacco: Never   Tobacco comments:    Using patches. STOPPED SMOKING WHEN STARTED PATCHES. Still chews Nicotene Gum. 11/10/2022  Vaping Use   Vaping status: Never Used  Substance Use Topics   Alcohol use: Yes    Alcohol/week: 11.0 standard drinks of alcohol    Types: 6 Cans of beer, 5 Shots of liquor per  week    Comment: daily   Drug use: Not Currently    Types: Cocaine      Objective  There were no vitals filed for this visit.There is no height or weight on file to calculate BMI.   Physical Exam: Constitutional: well-appearing, well-nourished; *** weight; no acute distress HENT: normocephalic atraumatic,  mucous membranes moist Eyes: conjunctiva non-erythematous Cardiovascular: regular rate and rhythm, no m/r/g Pulmonary/Chest: normal work of breathing on room air, lungs CTAB Abdominal: soft, non-tender, non-distended MSK: normal bulk and tone Neurological: alert & oriented x 3, no focal deficit Skin: warm and dry Extremities: BLE without edema or erythema. Psych: normal mood and behavior  {Labs (Optional):23779}    Assessment & Plan  There are no diagnoses linked to this encounter.  No follow-ups on file.  Polysubstance use -cocaine? -tobacco? *** - alcohol***  HTN -not on any antihypertensives, last reading of 125/78 on 09/18/23   Vitamin D  deficiency 09/02/23 level was 16.73  HCV Antibody positive but RNA negative - cleared infection?   Patient case discussed with Dr. {imcattendings:[id number]}{NAMES:[id number]::, who also saw and evaluated the patient.,.}  Letha Cheadle, MD [facility]  PGY-1 12/18/2023, 1:53 PM

## 2023-12-19 ENCOUNTER — Ambulatory Visit

## 2023-12-19 DIAGNOSIS — I10 Essential (primary) hypertension: Secondary | ICD-10-CM

## 2023-12-20 ENCOUNTER — Other Ambulatory Visit: Payer: Self-pay

## 2023-12-21 ENCOUNTER — Other Ambulatory Visit (HOSPITAL_COMMUNITY): Payer: Self-pay

## 2023-12-21 ENCOUNTER — Other Ambulatory Visit: Payer: Self-pay

## 2023-12-21 NOTE — Progress Notes (Signed)
 Specialty Pharmacy Refill Coordination Note  William [name] is a 63 y.o. male contacted today regarding refills of specialty medication(s) Bictegravir-Emtricitab-Tenofov (BIKTARVY )   Patient requested Delivery   Delivery date: 12/25/23   Verified address: [address], [place] [address]   Medication will be filled on: 12/22/23

## 2023-12-25 ENCOUNTER — Other Ambulatory Visit: Payer: Self-pay

## 2024-01-12 ENCOUNTER — Ambulatory Visit: Admitting: Podiatry

## 2024-01-12 ENCOUNTER — Encounter: Payer: Self-pay | Admitting: Podiatry

## 2024-01-12 DIAGNOSIS — M79675 Pain in left toe(s): Secondary | ICD-10-CM

## 2024-01-12 DIAGNOSIS — B351 Tinea unguium: Secondary | ICD-10-CM | POA: Diagnosis not present

## 2024-01-12 DIAGNOSIS — M79674 Pain in right toe(s): Secondary | ICD-10-CM | POA: Diagnosis not present

## 2024-01-12 NOTE — Progress Notes (Addendum)
 This patient presents to the office with chief complaint of long thick painful nails.  Patient says the nails are painful walking and wearing shoes.  This patient is unable to self treat.  This patient is unable to trim his nails since she is unable to reach his nails.   He presents to the office for preventative foot care services.  General Appearance  Alert, conversant and in no acute stress.  Vascular  Dorsalis pedis and posterior tibial  pulses are palpable  bilaterally.  Capillary return is within normal limits  bilaterally. Temperature is within normal limits  bilaterally.  Neurologic  Senn-Weinstein monofilament wire test within normal limits  bilaterally. Muscle power within normal limits bilaterally.  Nails Thick disfigured discolored nails with subungual debris  from hallux to fifth toes bilaterally. No evidence of bacterial infection or drainage bilaterally.  Orthopedic  No limitations of motion  feet .  No crepitus or effusions noted.  No bony pathology or digital deformities noted.  Skin  normotropic skin with no porokeratosis noted bilaterally.  No signs of infections or ulcers noted.   Skin eruption legs and feet.   Onychomycosis  Nails  B/L.  Pain in right toes  Pain in left toes  Dermatitis legs and feet.  Debridement of nails both feet followed trimming the nails with dremel tool.    RTC 12 weeks    Cordella Bold DPM

## 2024-01-16 ENCOUNTER — Other Ambulatory Visit: Payer: Self-pay

## 2024-01-18 ENCOUNTER — Other Ambulatory Visit: Payer: Self-pay

## 2024-01-23 ENCOUNTER — Other Ambulatory Visit (HOSPITAL_COMMUNITY): Payer: Self-pay

## 2024-01-24 ENCOUNTER — Other Ambulatory Visit (INDEPENDENT_AMBULATORY_CARE_PROVIDER_SITE_OTHER)
Admission: EM | Admit: 2024-01-24 | Discharge: 2024-01-26 | Disposition: A | Discharge status: Rehabilitation Facility | Source: Intra-hospital | Attending: Psychiatry | Admitting: Psychiatry

## 2024-01-24 ENCOUNTER — Other Ambulatory Visit: Payer: Self-pay

## 2024-01-24 ENCOUNTER — Encounter (HOSPITAL_COMMUNITY): Payer: Self-pay

## 2024-01-24 ENCOUNTER — Emergency Department (HOSPITAL_COMMUNITY)
Admission: EM | Admit: 2024-01-24 | Discharge: 2024-01-24 | Disposition: A | Discharge status: Other Acute Inpatient Hospital | Source: Home / Self Care | Attending: Emergency Medicine | Admitting: Emergency Medicine

## 2024-01-24 DIAGNOSIS — R45850 Homicidal ideations: Secondary | ICD-10-CM | POA: Diagnosis not present

## 2024-01-24 DIAGNOSIS — Y900 Blood alcohol level of less than 20 mg/100 ml: Secondary | ICD-10-CM | POA: Diagnosis not present

## 2024-01-24 DIAGNOSIS — F17210 Nicotine dependence, cigarettes, uncomplicated: Secondary | ICD-10-CM | POA: Diagnosis not present

## 2024-01-24 DIAGNOSIS — Z21 Asymptomatic human immunodeficiency virus [HIV] infection status: Secondary | ICD-10-CM | POA: Insufficient documentation

## 2024-01-24 DIAGNOSIS — F1590 Other stimulant use, unspecified, uncomplicated: Secondary | ICD-10-CM

## 2024-01-24 DIAGNOSIS — F1090 Alcohol use, unspecified, uncomplicated: Secondary | ICD-10-CM

## 2024-01-24 DIAGNOSIS — B20 Human immunodeficiency virus [HIV] disease: Secondary | ICD-10-CM | POA: Insufficient documentation

## 2024-01-24 DIAGNOSIS — F1910 Other psychoactive substance abuse, uncomplicated: Secondary | ICD-10-CM | POA: Insufficient documentation

## 2024-01-24 DIAGNOSIS — I10 Essential (primary) hypertension: Secondary | ICD-10-CM | POA: Insufficient documentation

## 2024-01-24 DIAGNOSIS — F1914 Other psychoactive substance abuse with psychoactive substance-induced mood disorder: Secondary | ICD-10-CM | POA: Diagnosis not present

## 2024-01-24 DIAGNOSIS — F1410 Cocaine abuse, uncomplicated: Secondary | ICD-10-CM | POA: Diagnosis not present

## 2024-01-24 DIAGNOSIS — Z79899 Other long term (current) drug therapy: Secondary | ICD-10-CM | POA: Insufficient documentation

## 2024-01-24 DIAGNOSIS — F1510 Other stimulant abuse, uncomplicated: Secondary | ICD-10-CM

## 2024-01-24 DIAGNOSIS — F1994 Other psychoactive substance use, unspecified with psychoactive substance-induced mood disorder: Secondary | ICD-10-CM | POA: Diagnosis present

## 2024-01-24 DIAGNOSIS — F1020 Alcohol dependence, uncomplicated: Secondary | ICD-10-CM | POA: Insufficient documentation

## 2024-01-24 DIAGNOSIS — E785 Hyperlipidemia, unspecified: Secondary | ICD-10-CM | POA: Insufficient documentation

## 2024-01-24 DIAGNOSIS — R45851 Suicidal ideations: Secondary | ICD-10-CM | POA: Diagnosis not present

## 2024-01-24 LAB — COMPREHENSIVE METABOLIC PANEL WITH GFR
ALT: 28 U/L (ref 0–44)
AST: 41 U/L (ref 15–41)
Albumin: 4.5 g/dL (ref 3.5–5.0)
Alkaline Phosphatase: 87 U/L (ref 38–126)
Anion gap: 10 (ref 5–15)
BUN: 14 mg/dL (ref 8–23)
CO2: 27 mmol/L (ref 22–32)
Calcium: 9.5 mg/dL (ref 8.9–10.3)
Chloride: 102 mmol/L (ref 98–111)
Creatinine, Ser: 0.75 mg/dL (ref 0.61–1.24)
GFR, Estimated: >60 mL/min (ref >60)
Glucose, Bld: 100 mg/dL — ABNORMAL HIGH (ref 70–99)
Potassium: 4.7 mmol/L (ref 3.5–5.1)
Sodium: 138 mmol/L (ref 135–145)
Total Bilirubin: 0.5 mg/dL (ref 0.0–1.2)
Total Protein: 8.7 g/dL — ABNORMAL HIGH (ref 6.5–8.1)

## 2024-01-24 LAB — CBC
HCT: 46.4 % (ref 39.0–52.0)
Hemoglobin: 15.5 g/dL (ref 13.0–17.0)
MCH: 29.5 pg (ref 26.0–34.0)
MCHC: 33.4 g/dL (ref 30.0–36.0)
MCV: 88.4 fL (ref 80.0–100.0)
Platelets: 305 K/uL (ref 150–400)
RBC: 5.25 MIL/uL (ref 4.22–5.81)
RDW: 13.8 % (ref 11.5–15.5)
WBC: 7.6 K/uL (ref 4.0–10.5)
nRBC: 0 % (ref 0.0–0.2)

## 2024-01-24 LAB — LIPID PANEL
Cholesterol: 169 mg/dL (ref 0–200)
HDL: 61 mg/dL (ref >40)
LDL Cholesterol: 92 mg/dL (ref 0–99)
Total CHOL/HDL Ratio: 2.8 ratio
Triglycerides: 77 mg/dL (ref <150)
VLDL: 15 mg/dL (ref 0–40)

## 2024-01-24 LAB — URINE DRUG SCREEN
Amphetamines: POSITIVE — AB
Barbiturates: NEGATIVE
Benzodiazepines: NEGATIVE
Cocaine: POSITIVE — AB
Fentanyl: NEGATIVE
Methadone Scn, Ur: NEGATIVE
Opiates: NEGATIVE
Tetrahydrocannabinol: NEGATIVE

## 2024-01-24 LAB — TSH: TSH: 2.34 u[IU]/mL (ref 0.350–4.500)

## 2024-01-24 LAB — ETHANOL: Alcohol, Ethyl (B): <15 mg/dL (ref <15)

## 2024-01-24 MED ORDER — ONDANSETRON 4 MG PO TBDP: 4.0000 mg | ORAL_TABLET | Freq: Four times a day (QID) | ORAL | Status: DC | PRN

## 2024-01-24 MED ORDER — TRAZODONE HCL 50 MG PO TABS: 50.0000 mg | ORAL_TABLET | Freq: Every evening | ORAL | Status: DC | PRN

## 2024-01-24 MED ORDER — LOPERAMIDE HCL 2 MG PO CAPS: 2.0000 mg | ORAL_CAPSULE | ORAL | Status: DC | PRN

## 2024-01-24 MED ORDER — DICYCLOMINE HCL 20 MG PO TABS: 20.0000 mg | ORAL_TABLET | Freq: Four times a day (QID) | ORAL | Status: DC | PRN

## 2024-01-24 MED ORDER — METHOCARBAMOL 500 MG PO TABS: 500.0000 mg | ORAL_TABLET | Freq: Three times a day (TID) | ORAL | Status: DC | PRN

## 2024-01-24 MED ORDER — HYDROXYZINE HCL 25 MG PO TABS: 25.0000 mg | ORAL_TABLET | Freq: Four times a day (QID) | ORAL | Status: DC | PRN

## 2024-01-24 MED ORDER — CLONIDINE HCL 0.1 MG PO TABS: 0.1000 mg | ORAL_TABLET | Freq: Every day | ORAL | Status: DC

## 2024-01-24 MED ORDER — OLANZAPINE 10 MG IM SOLR: 5.0000 mg | Freq: Three times a day (TID) | INTRAMUSCULAR | Status: DC | PRN

## 2024-01-24 MED ORDER — BICTEGRAVIR-EMTRICITAB-TENOFOV 50-200-25 MG PO TABS
1.0000 | ORAL_TABLET | Freq: Every day | ORAL | Status: DC
  Filled 2024-01-24: qty 1

## 2024-01-24 MED ORDER — OLANZAPINE 5 MG PO TBDP: 5.0000 mg | ORAL_TABLET | Freq: Three times a day (TID) | ORAL | Status: DC | PRN

## 2024-01-24 MED ORDER — HYDROCERIN EX CREA
TOPICAL_CREAM | Freq: Two times a day (BID) | CUTANEOUS | Status: DC
  Filled 2024-01-24 (×2): qty 113

## 2024-01-24 MED ORDER — CLONIDINE HCL 0.1 MG PO TABS: 0.1000 mg | ORAL_TABLET | Freq: Two times a day (BID) | ORAL | Status: DC

## 2024-01-24 MED ORDER — NAPROXEN 500 MG PO TABS: 500.0000 mg | ORAL_TABLET | Freq: Two times a day (BID) | ORAL | Status: DC | PRN

## 2024-01-24 MED ORDER — CLONIDINE HCL 0.1 MG PO TABS: 0.1000 mg | ORAL_TABLET | Freq: Two times a day (BID) | ORAL | Status: DC | PRN

## 2024-01-24 MED ORDER — BICTEGRAVIR-EMTRICITAB-TENOFOV 50-200-25 MG PO TABS
1.0000 | ORAL_TABLET | Freq: Every day | ORAL | Status: DC
  Administered 2024-01-25 – 2024-01-26 (×2): 1 via ORAL
  Filled 2024-01-24 (×2): qty 1

## 2024-01-24 MED ORDER — ALUM & MAG HYDROXIDE-SIMETH 200-200-20 MG/5ML PO SUSP: 30.0000 mL | ORAL | Status: DC | PRN

## 2024-01-24 MED ORDER — MAGNESIUM HYDROXIDE 400 MG/5ML PO SUSP: 30.0000 mL | Freq: Every day | ORAL | Status: DC | PRN

## 2024-01-24 MED ORDER — ADULT MULTIVITAMIN W/MINERALS CH
1.0000 | ORAL_TABLET | Freq: Every day | ORAL | Status: DC
  Administered 2024-01-25 – 2024-01-26 (×2): 1 via ORAL
  Filled 2024-01-24 (×2): qty 1

## 2024-01-24 MED ORDER — HYDROXYZINE HCL 25 MG PO TABS
25.0000 mg | ORAL_TABLET | Freq: Three times a day (TID) | ORAL | Status: DC | PRN
  Administered 2024-01-26: 25 mg via ORAL
  Filled 2024-01-24: qty 1

## 2024-01-24 MED ORDER — THIAMINE MONONITRATE 100 MG PO TABS
100.0000 mg | ORAL_TABLET | Freq: Every day | ORAL | Status: DC
  Administered 2024-01-25 – 2024-01-26 (×2): 100 mg via ORAL
  Filled 2024-01-24 (×2): qty 1

## 2024-01-24 MED ORDER — LORAZEPAM 1 MG PO TABS: 1.0000 mg | ORAL_TABLET | Freq: Four times a day (QID) | ORAL | Status: DC | PRN

## 2024-01-24 MED ORDER — ACETAMINOPHEN 325 MG PO TABS
650.0000 mg | ORAL_TABLET | Freq: Four times a day (QID) | ORAL | Status: DC | PRN
  Administered 2024-01-26: 650 mg via ORAL
  Filled 2024-01-24: qty 2

## 2024-01-24 MED ORDER — CLONIDINE HCL 0.1 MG PO TABS
0.1000 mg | ORAL_TABLET | Freq: Four times a day (QID) | ORAL | Status: DC
  Administered 2024-01-24: 16:00:00 0.1 mg via ORAL
  Filled 2024-01-24: qty 1

## 2024-01-24 NOTE — Progress Notes (Signed)
 Patient is currently resting at this time.  No acute distress noted.  Respirations present, even and unlabored.  No issues noted.  Will continue Q 15 min safety checks for safety/behavior per facility protocol.
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
## 2024-01-24 NOTE — ED Notes (Addendum)
 Pt transferred from [facility] to [facility] under voluntary admission requesting detox from cocaine and endorsing passive SI &HI toward cocaine dealer but denies plan or intent. Calm, cooperative throughout interview process. Skin assessment completed. Oriented to unit. Meal and drink offered. Pt verbally contract for safety. Will monitor for safety.

## 2024-01-24 NOTE — ED Notes (Signed)
 Pt reports he already took his Biktarvy  today before arrival to ED.
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
## 2024-01-24 NOTE — ED Provider Notes (Signed)
 [place] EMERGENCY DEPARTMENT AT [facility] Provider Note   CSN: [id number] Arrival date & time: 01/24/24  1208     Patient presents with: Suicidal and Homicidal   William [name] is a 63 y.o. male.   63 year old male complains of polysubstance abuse along with suicidal and homicidal ideations.  Has homicidal ideations towards his crack the other.  States he feels suicidal but without a plan.  No prior history of suicide before in the past.  Does have occasional alcohol use.  History of paranoid schizophrenia and has been hearing voices but denies responding to them.  Presents via mobile crisis network.       Prior to Admission medications  Medication Sig Start Date End Date Taking? Authorizing Provider  bictegravir-emtricitabine -tenofovir  AF (BIKTARVY ) 50-200-25 MG TABS tablet Take 1 tablet by mouth daily. 08/10/23   Vu, Constance T, MD  rosuvastatin  (CRESTOR ) 10 MG tablet Take 1 tablet (10 mg total) by mouth daily. Patient not taking: Reported on 09/18/2023 07/14/23   Francella Rogue, MD    Allergies: Patient has no known allergies.    Review of Systems  All other systems reviewed and are negative.   Updated Vital Signs BP (!) 143/113 (BP Location: Left Arm)   Pulse 66   Temp 97.7 F (36.5 C) (Oral)   Resp 18   Ht 1.803 m (5' 11)   Wt 85.7 kg   SpO2 96%   BMI 26.36 kg/m   Physical Exam Vitals and nursing note reviewed.  Constitutional:      General: He is not in acute distress.    Appearance: Normal appearance. He is well-developed. He is not toxic-appearing.  HENT:     Head: Normocephalic and atraumatic.  Eyes:     General: Lids are normal.     Conjunctiva/sclera: Conjunctivae normal.     Pupils: Pupils are equal, round, and reactive to light.  Neck:     Thyroid : No thyroid  mass.     Trachea: No tracheal deviation.  Cardiovascular:     Rate and Rhythm: Normal rate and regular rhythm.     Heart sounds: Normal heart sounds. No murmur heard.    No  gallop.  Pulmonary:     Effort: Pulmonary effort is normal. No respiratory distress.     Breath sounds: Normal breath sounds. No stridor. No decreased breath sounds, wheezing, rhonchi or rales.  Abdominal:     General: There is no distension.     Palpations: Abdomen is soft.     Tenderness: There is no abdominal tenderness. There is no rebound.  Musculoskeletal:        General: No tenderness. Normal range of motion.     Cervical back: Normal range of motion and neck supple.  Skin:    General: Skin is warm and dry.     Findings: No abrasion or rash.  Neurological:     Mental Status: He is alert and oriented to person, place, and time. Mental status is at baseline.     GCS: GCS eye subscore is 4. GCS verbal subscore is 5. GCS motor subscore is 6.     Cranial Nerves: No cranial nerve deficit.     Sensory: No sensory deficit.     Motor: Motor function is intact.  Psychiatric:        Attention and Perception: Attention normal.        Mood and Affect: Affect is blunt and flat.        Speech: Speech normal.  Behavior: Behavior normal.        Thought Content: Thought content includes suicidal ideation. Thought content does not include suicidal plan.     (all labs ordered are listed, but only abnormal results are displayed) Labs Reviewed  COMPREHENSIVE METABOLIC PANEL WITH GFR - Abnormal; Notable for the following components:      Result Value   Glucose, Bld 100 (*)    Total Protein 8.7 (*)    All other components within normal limits  ETHANOL  CBC  URINE DRUG SCREEN    EKG: None  Radiology: No results found.   Procedures   Medications Ordered in the ED - No data to display                                  Medical Decision Making Amount and/or Complexity of Data Reviewed Labs: ordered.   Patient's labs reviewed and he is medically cleared at this time for psychiatric disposition.  Will consult behavioral health service     Final diagnoses:  None    ED  Discharge Orders     None          Dasie Faden, MD 01/24/24 1319

## 2024-01-24 NOTE — ED Notes (Signed)
 Patient is alert and oriented x 4 with no acute distress noted.  Patient currently expressing passive SI with no plan.  Patient has attended AA group during shift.  Food and drink offered during shift. Patient currently denies HI/AVH at this time.  Will continue Q 15 min safety checks for safety/behavior per facility protocol.

## 2024-01-24 NOTE — ED Notes (Signed)
 Pt sitting in dayroom watching television and interacting with peers. No acute distress noted. No concerns voiced. Informed pt to notify staff with any needs or assistance. Pt verbalized understanding and agreement. Will continue to monitor for safety.

## 2024-01-25 LAB — HEMOGLOBIN A1C
Hgb A1c MFr Bld: 5.7 % — ABNORMAL HIGH (ref 4.8–5.6)
Mean Plasma Glucose: 116.89 mg/dL

## 2024-01-25 NOTE — Care Management (Addendum)
 [facility] Care Management...  Addendum 9:50 am  Per Powell at [facility], patient has been accepted to their program  Heater will [employer] Care[employer] Adviser to discuss transportation   Addendum 9:35 am  Patient currently completing phone assessment with [facility]   Addendum 8:49 am  Per representative at [facility] [facility] [facility], patient declined due to insurance  Per Representative at [facility] patient declined due to Solicitor met with patient to discuss discharge planning  Patient reported having clean time and something happened the other day and he got the great idea to use  Patient reported that he is looking for long term inpatient treatment to get his life together  Patient has [employer] Technologist will refer to [facility], [facility] [facility], Risk Manager, [employer]

## 2024-01-25 NOTE — Care Management (Signed)
 [facility] Care Management...  Writer spoke [facility]).   The client has been accepted into their program and they will provide transportation. They report they will pick the client up by tomorrow afternoon.  Writer encouraged before 11am but they report they could not guarantee by that time.    Writer informed staff regarding pickup time to [facility] tomorrow, 01/26/24.

## 2024-01-25 NOTE — Group Note (Signed)
 Group Topic: Recovery Basics  Group Date: 01/25/2024 Start Time: 2000 End Time: 2030 Facilitators: Verdon Jacqualyn BRAVO, NT  Department: [facility]  Number of Participants: 7  Group Focus: coping skills Treatment Modality:  Patient-Centered Therapy Interventions utilized were group exercise Purpose: relapse prevention strategies  Name: William [name] Date of Birth: 1960/02/27  MR: [id number]    Level of Participation: active Quality of Participation: cooperative Interactions with others: gave feedback Mood/Affect: appropriate Triggers (if applicable): n/a Cognition: coherent/clear Progress: Moderate Response: n/a Plan: follow-up needed  Patients Problems:  Patient Active Problem List   Diagnosis Date Noted   Polysubstance abuse (HCC) 01/24/2024   Stimulant abuse (HCC) 01/24/2024   Substance induced mood disorder (HCC) 01/24/2024   Alcohol use 01/24/2024   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   Hepatitis C antibody detected 09/03/2023   Cocaine abuse with cocaine-induced mood disorder (HCC) 09/02/2023   Dilatation of aorta ~25mm  (HCC) 08/18/2023   Staphylococcus aureus bacteremia 08/17/2023   Cellulitis of right leg 08/16/2023   Healthcare maintenance 07/14/2023   Cellulitis of right lower extremity 02/12/2023   Eczematous dermatitis 02/06/2023   Skin ulcers of foot, bilateral (HCC), R> L, painful 02/05/2023   Pure hypercholesterolemia 09/01/2022   Dysphagia 01/12/2021   Gastritis 01/12/2021   Screening for colon cancer 05/25/2020   Hypertension 03/27/2019   GERD with esophagitis 09/01/2016   Cigarette smoker 12/23/2014   Seasonal allergies 01/22/2013   Human immunodeficiency virus (HIV) disease (HCC) 07/10/2006   Alcohol use disorder, moderate, dependence (HCC) 07/10/2006

## 2024-01-25 NOTE — Group Note (Signed)
 Group Topic: Wellness  Group Date: 01/25/2024 Start Time: 1130 End Time: 1200 Facilitators: Belle Camellia SAILOR, RN  Department: [facility]  Number of Participants: 8  Group Focus: other Nutrition and recovery Treatment Modality:  Psychoeducation Interventions utilized were patient education Purpose: increase insight and trigger / craving management  Name: William [name] Date of Birth: 07-Jun-1960  MR: [id number]    Level of Participation: moderate Quality of Participation: quiet Interactions with others: gave feedback Mood/Affect: appropriate  Cognition: logical Progress: Moderate Plan: follow-up needed  Patients Problems:  Patient Active Problem List   Diagnosis Date Noted   Polysubstance abuse (HCC) 01/24/2024   Stimulant abuse (HCC) 01/24/2024   Substance induced mood disorder (HCC) 01/24/2024   Alcohol use 01/24/2024   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   Hepatitis C antibody detected 09/03/2023   Cocaine abuse with cocaine-induced mood disorder (HCC) 09/02/2023   Dilatation of aorta ~31mm  (HCC) 08/18/2023   Staphylococcus aureus bacteremia 08/17/2023   Cellulitis of right leg 08/16/2023   Healthcare maintenance 07/14/2023   Cellulitis of right lower extremity 02/12/2023   Eczematous dermatitis 02/06/2023   Skin ulcers of foot, bilateral (HCC), R> L, painful 02/05/2023   Pure hypercholesterolemia 09/01/2022   Dysphagia 01/12/2021   Gastritis 01/12/2021   Screening for colon cancer 05/25/2020   Hypertension 03/27/2019   GERD with esophagitis 09/01/2016   Cigarette smoker 12/23/2014   Seasonal allergies 01/22/2013   Human immunodeficiency virus (HIV) disease (HCC) 07/10/2006   Alcohol use disorder, moderate, dependence (HCC) 07/10/2006

## 2024-01-25 NOTE — ED Provider Notes (Signed)
 Facility Based Crisis Admission H&P  Date: 01/25/2024 Patient Name: William [name] MRN: [id number] Chief Complaint: Long-term rehabilitation  Diagnoses:  Final diagnoses:  Primary hypertension  Human immunodeficiency virus (HIV) disease (HCC)  Alcohol use disorder, moderate, dependence (HCC)  Stimulant use disorder    HPI: William [name] 63 y.o., male patient presented to [facility] as a voluntary from [facility] with complains of polysubstance abuse along with suicidal and homicidal ideations. Has homicidal ideations towards his crack the other. States he feels suicidal but without a plan..  Pt has psychiatric history of paranoid schizophrenia, alcohol induce mood disorder, suicidal ideation. Medical history includes HIV, hyperlipidemia, GERD and HTN. Current medications include Biktarvy , Crestor , and Eucerin. Patient reports a history of substance rehabilitation a few years ago at [facility]. He states that he is hoping to enter a long-term rehabilitation program after completing detox.  William [name], is seen face to face by this provider, consulted with Dr. Cole; and chart reviewed on 01/25/2024.  On evaluation William [name] reports chronic auditory hallucinations, stating that he hears voices all the time but is unable to describe what the voices are saying. He initially endorsed visual hallucinations; however, when asked for clarification, he stated that he does not experience visual hallucinations and only has auditory hallucinations.  He reports alcohol use of approximately 10-40 oz of beer daily, with last use yesterday. He states he began drinking around age 38-13. He reports cocaine use, with last use on Monday, describing the amount as a lot, though he was unable to quantify it. He states that alcohol has been his primary substance. He reports smoking 2-3 cigarettes daily.  He denies withdrawal symptoms at this time and denies any history of seizures or blackouts.  He reports a  psychiatric hospitalization in the 1980s at [facility]. He denies currently having a therapist or psychiatrist.  He reports his sleep and appetite are good, but notes that he only arrived yesterday. He is denying symptoms of depression and anxiety at this time.  During evaluation William [name] is sitting on his bed, in no acute distress.  He is alert & oriented x 4, calm, cooperative and attentive for this assessment.  His mood is described as anxious. Affect is appropriate. He made very minimal eye contact and was mostly looking away from the provider. Speech and behavior are normal. He is denying suicidal or homicidal ideations. PHQ 2-9:  [employer] Office Visit from 10/26/2022 in [facility] - A Dept Of [place]. [facility] Most recent reading at 10/26/2022  3:19 PM Clinical Support from 03/15/2022 in [facility] Internal Med [facility] - A Dept Of [place]. [facility] Most recent reading at 03/15/2022 10:41 AM Office Visit from 03/15/2022 in [facility] Internal Med [facility] - A Dept [place]. [facility] Most recent reading at 03/15/2022  8:49 AM  Thoughts that you would be better off dead, or of hurting yourself in some way Not at all Not at all Not at all  PHQ-9 Total Score 0 0 0    Flowsheet Row ED from 01/24/2024 in [facility] Most recent reading at 01/24/2024  5:55 PM ED from 01/24/2024 in [facility] Emergency Department at [facility] Most recent reading at 01/24/2024 12:24 PM ED from 09/01/2023 in [facility] Most recent reading at 09/03/2023 10:43 AM  C-SSRS RISK CATEGORY Moderate Risk High Risk No Risk  Total Time spent with patient: 45 minutes  Musculoskeletal  Strength & Muscle Tone: within normal limits Gait & Station: normal Patient leans: Front  Psychiatric Specialty Exam  Presentation General Appearance:  Appropriate for Environment  Eye  Contact: Minimal  Speech: Clear and Coherent  Speech Volume: Normal  Handedness: Right   Mood and Affect  Mood: Euthymic  Affect: Appropriate   Thought Process  Thought Processes: Coherent; Goal Directed  Descriptions of Associations:Intact  Orientation:Full (Time, Place and Person)  Thought Content:WDL  Diagnosis of Schizophrenia or Schizoaffective disorder in past: No   Hallucinations:Hallucinations: None  Ideas of Reference:None  Suicidal Thoughts:Suicidal Thoughts: No  Homicidal Thoughts:Homicidal Thoughts: No   Sensorium  Memory: Immediate Fair  Judgment: Fair  Insight: Fair   Art Therapist  Concentration: Good  Attention Span: Fair  Recall: Fair  Fund of Knowledge: Fair  Language: Fair   Psychomotor Activity  Psychomotor Activity: Psychomotor Activity: Normal   Assets  Assets: Communication Skills   Sleep  Sleep:No data recorded  No data recorded  Physical Exam Vitals reviewed.  Constitutional:      Appearance: Normal appearance.  HENT:     Head: Normocephalic and atraumatic.     Nose: Nose normal.     Mouth/Throat:     Pharynx: Oropharynx is clear.  Cardiovascular:     Rate and Rhythm: Normal rate.  Pulmonary:     Effort: Pulmonary effort is normal.  Musculoskeletal:        General: Normal range of motion.     Cervical back: Normal range of motion.  Neurological:     Mental Status: He is alert and oriented to person, place, and time.  Psychiatric:        Attention and Perception: He perceives auditory hallucinations.        Mood and Affect: Mood normal.        Speech: Speech normal.        Behavior: Behavior normal. Behavior is cooperative.        Cognition and Memory: Cognition normal.        Judgment: Judgment normal.    Review of Systems  Skin:  Positive for rash.  Psychiatric/Behavioral:  Positive for hallucinations and substance abuse.   All other systems reviewed and are  negative.   Blood pressure 115/80, pulse 72, temperature 97.6 F (36.4 C), resp. rate 18, SpO2 96%. There is no height or weight on file to calculate BMI.  Past Psychiatric History: Paranoid schizophrenia, alcohol induce mood disorder, suicidal ideation.  Is the patient at risk to self? Yes  Has the patient been a risk to self in the past 6 months? No .    Has the patient been a risk to self within the distant past? No   Is the patient a risk to others? Yes   Has the patient been a risk to others in the past 6 months? No   Has the patient been a risk to others within the distant past? No   Past Medical History: HIV, B/L arm Rash, HTN Family History: Reports brother has ETOH use disorder Social History: Widower in 2016 following the passing of his wife. He has no children. He works as a advertising copywriter at [employer]. He lives with his older brother and has a high education officer, community. Last Labs:  Admission on 01/24/2024  Component Date Value Ref Range Status   Cholesterol 01/24/2024 169  0 - 200 mg/dL Final   Comment:  ATP III CLASSIFICATION:  <200     mg/dL   Desirable  799-760  mg/dL   Borderline High  >=759    mg/dL   High           Triglycerides 01/24/2024 77  <150 mg/dL Final   HDL 87/82/7974 61  >40 mg/dL Final   Total CHOL/HDL Ratio 01/24/2024 2.8  RATIO Final   VLDL 01/24/2024 15  0 - 40 mg/dL Final   LDL Cholesterol 01/24/2024 92  0 - 99 mg/dL Final   Comment:        Total Cholesterol/HDL:CHD Risk Coronary Heart Disease Risk Table                     Men   Women  1/2 Average Risk   3.4   3.3  Average Risk       5.0   4.4  2 X Average Risk   9.6   7.1  3 X Average Risk  23.4   11.0        Use the calculated Patient Ratio above and the CHD Risk Table to determine the patient's CHD Risk.        ATP III CLASSIFICATION (LDL):  <100     mg/dL   Optimal  899-870  mg/dL   Near or Above                    Optimal  130-159  mg/dL   Borderline  839-810  mg/dL    High  >809     mg/dL   Very High Performed at [facility], [address] [place], [place] [address]    TSH 01/24/2024 2.340  0.350 - 4.500 uIU/mL Final   Performed at [facility], [address], [place], [place] [address]   Hgb A1c MFr Bld 01/24/2024 5.7 (H)  4.8 - 5.6 % Final   Comment: (NOTE) Diagnosis of Diabetes The following HbA1c ranges recommended by the American Diabetes Association (ADA) may be used as an aid in the diagnosis of diabetes mellitus.  Hemoglobin             Suggested A1C NGSP%              Diagnosis  <5.7                   Non Diabetic  5.7-6.4                Pre-Diabetic  >6.4                   Diabetic  <7.0                   Glycemic control for                       adults with diabetes.     Mean Plasma Glucose 01/24/2024 116.89  mg/dL Final   Performed at [facility], [address], [place], [place] [address]  Admission on 01/24/2024, Discharged on 01/24/2024  Component Date Value Ref Range Status   Sodium 01/24/2024 138  135 - 145 mmol/L Final   Potassium 01/24/2024 4.7  3.5 - 5.1 mmol/L Final   HEMOLYSIS AT THIS LEVEL MAY AFFECT RESULT   Chloride 01/24/2024 102  98 - 111 mmol/L Final   CO2 01/24/2024 27  22 - 32 mmol/L Final   Glucose, Bld 01/24/2024 100 (  H)  70 - 99 mg/dL Final   Glucose reference range applies only to samples taken after fasting for at least 8 hours.   BUN 01/24/2024 14  8 - 23 mg/dL Final   Creatinine, Ser 01/24/2024 0.75  0.61 - 1.24 mg/dL Final   Calcium  01/24/2024 9.5  8.9 - 10.3 mg/dL Final   Total Protein 87/82/7974 8.7 (H)  6.5 - 8.1 g/dL Final   Albumin 87/82/7974 4.5  3.5 - 5.0 g/dL Final   AST 87/82/7974 41  15 - 41 U/L Final   HEMOLYSIS AT THIS LEVEL MAY AFFECT RESULT   ALT 01/24/2024 28  0 - 44 U/L Final   Alkaline Phosphatase 01/24/2024 87  38 - 126 U/L Final   Total Bilirubin 01/24/2024 0.5  0.0 - 1.2 mg/dL Final   GFR, Estimated 01/24/2024 >60  >60 mL/min Final   Comment:  (NOTE) Calculated using the CKD-EPI Creatinine Equation (2021)    Anion gap 01/24/2024 10  5 - 15 Final   Performed at [facility], [address], [place], [place] [address]   Alcohol, Ethyl (B) 01/24/2024 <15  <15 mg/dL Final   Comment: (NOTE) For medical purposes only. Performed at [facility], [address] [place], [place] [address]    WBC 01/24/2024 7.6  4.0 - 10.5 K/uL Final   RBC 01/24/2024 5.25  4.22 - 5.81 MIL/uL Final   Hemoglobin 01/24/2024 15.5  13.0 - 17.0 g/dL Final   HCT 87/82/7974 46.4  39.0 - 52.0 % Final   MCV 01/24/2024 88.4  80.0 - 100.0 fL Final   MCH 01/24/2024 29.5  26.0 - 34.0 pg Final   MCHC 01/24/2024 33.4  30.0 - 36.0 g/dL Final   RDW 87/82/7974 13.8  11.5 - 15.5 % Final   Platelets 01/24/2024 305  150 - 400 K/uL Final   nRBC 01/24/2024 0.0  0.0 - 0.2 % Final   Performed at [facility], [address] [place], [place] [address]   Opiates 01/24/2024 NEGATIVE  NEGATIVE Final   Cocaine 01/24/2024 POSITIVE (A)  NEGATIVE Final   Benzodiazepines 01/24/2024 NEGATIVE  NEGATIVE Final   Amphetamines 01/24/2024 POSITIVE (A)  NEGATIVE Final   Tetrahydrocannabinol 01/24/2024 NEGATIVE  NEGATIVE Final   Barbiturates 01/24/2024 NEGATIVE  NEGATIVE Final   Methadone Scn, Ur 01/24/2024 NEGATIVE  NEGATIVE Final   Fentanyl  01/24/2024 NEGATIVE  NEGATIVE Final   Comment: (NOTE) Drug screen is for Medical Purposes only. Positive results are preliminary only. If confirmation is needed, notify lab within 5 days.  Drug Class                 Cutoff (ng/mL) Amphetamine and metabolites 1000 Barbiturate and metabolites 200 Benzodiazepine              200 Opiates and metabolites     300 Cocaine and metabolites     300 THC                         50 Fentanyl                     5 Methadone                   300  Trazodone  is metabolized in vivo to several metabolites,  including pharmacologically active m-CPP,  which is excreted in the  urine.  Immunoassay screens for amphetamines and MDMA have potential  cross-reactivity with these compounds  and may provide false positive  result.  Performed at [facility], [address] [place], [place] [address]   Admission on 09/01/2023, Discharged on 09/06/2023  Component Date Value Ref Range Status   SARS Coronavirus 2 by RT PCR 09/02/2023 NEGATIVE  NEGATIVE Final   Performed at [facility], [address], [place], [place] [address]   Magnesium  09/02/2023 1.9  1.7 - 2.4 mg/dL Final   Performed at [facility], [address], [place], [place] [address]   Ammonia 09/02/2023 18  9 - 35 umol/L Final   Performed at [facility], [address] [place], [place], [place] [address]   Hgb A1c MFr Bld 09/02/2023 5.5  4.8 - 5.6 % Final   Comment: (NOTE) Diagnosis of Diabetes The following HbA1c ranges recommended by the American Diabetes Association (ADA) may be used as an aid in the diagnosis of diabetes mellitus.  Hemoglobin             Suggested A1C NGSP%              Diagnosis  <5.7                   Non Diabetic  5.7-6.4                Pre-Diabetic  >6.4                   Diabetic  <7.0                   Glycemic control for                       adults with diabetes.     Mean Plasma Glucose 09/02/2023 111.15  mg/dL Final   Performed at [facility], [address], [place], [place] [address]   Vit D, 25-Hydroxy 09/02/2023 16.73 (L)  30 - 100 ng/mL Final   Comment: (NOTE) Vitamin D  deficiency has been defined by the Institute of Medicine  and an Endocrine Society practice guideline as a level of serum 25-OH  vitamin D  less than 20 ng/mL (1,2). The Endocrine Society went on to  further define vitamin D  insufficiency as a level between 21 and 29  ng/mL (2).  1. IOM (Institute of Medicine). 2010. Dietary reference intakes for  calcium  and D. [place]: [employer]. 2. Holick MF,  Binkley Dyer, Bischoff-Ferrari HA, et al. Evaluation,  treatment, and prevention of vitamin D  deficiency: an Endocrine  Society clinical practice guideline, JCEM. 2011 Jul; 96(7): 1911-30.  Performed at [facility], [address], [place], [place] [address]    Vitamin B-12 09/02/2023 292  180 - 914 pg/mL Final   Comment: (NOTE) This assay is not validated for testing neonatal or myeloproliferative syndrome specimens for Vitamin B12 levels. Performed at [facility], [address] [place], [place] [address]    Hepatitis B Surface Ag 09/02/2023 NON REACTIVE  NON REACTIVE Final   HCV Ab 09/02/2023 Reactive (A)  NON REACTIVE Final   Comment: (NOTE) The CDC recommends that a Reactive HCV antibody result be followed up  with a HCV Nucleic Acid Amplification test.     Hep A IgM 09/02/2023 NON REACTIVE  NON REACTIVE Final   Hep B C IgM 09/02/2023 NON REACTIVE  NON REACTIVE Final   Performed at [facility], [address] [place],  [place] [address]   HCV RNA, Quantitation 09/04/2023 HCV Not Detected  IU/mL Final   No evidence of active HCV infection.   Test Information: 09/04/2023 Comment   Final   Comment: (NOTE) The quantitative range of this assay is 15 IU/mL to 100 million IU/mL. Performed At: [facility] [address] [place], [place] [id number] Jennette Shorter MD Ey:[id number]   Admission on 08/31/2023, Discharged on 08/31/2023  Component Date Value Ref Range Status   Sodium 08/31/2023 136  135 - 145 mmol/L Final   Potassium 08/31/2023 3.6  3.5 - 5.1 mmol/L Final   Chloride 08/31/2023 104  98 - 111 mmol/L Final   CO2 08/31/2023 25  22 - 32 mmol/L Final   Glucose, Bld 08/31/2023 88  70 - 99 mg/dL Final   Glucose reference range applies only to samples taken after fasting for at least 8 hours.   BUN 08/31/2023 13  8 - 23 mg/dL Final   Creatinine, Ser 08/31/2023 0.75  0.61 - 1.24 mg/dL Final   Calcium  08/31/2023 9.0  8.9 - 10.3 mg/dL Final   Total  Protein 08/31/2023 7.9  6.5 - 8.1 g/dL Final   Albumin 92/75/7974 4.0  3.5 - 5.0 g/dL Final   AST 92/75/7974 26  15 - 41 U/L Final   ALT 08/31/2023 20  0 - 44 U/L Final   Alkaline Phosphatase 08/31/2023 64  38 - 126 U/L Final   Total Bilirubin 08/31/2023 0.7  0.0 - 1.2 mg/dL Final   GFR, Estimated 08/31/2023 >60  >60 mL/min Final   Comment: (NOTE) Calculated using the CKD-EPI Creatinine Equation (2021)    Anion gap 08/31/2023 7  5 - 15 Final   Performed at [facility], [address], [place], [place] [address]   WBC 08/31/2023 7.8  4.0 - 10.5 K/uL Final   RBC 08/31/2023 4.55  4.22 - 5.81 MIL/uL Final   Hemoglobin 08/31/2023 13.0  13.0 - 17.0 g/dL Final   HCT 92/75/7974 39.7  39.0 - 52.0 % Final   MCV 08/31/2023 87.3  80.0 - 100.0 fL Final   MCH 08/31/2023 28.6  26.0 - 34.0 pg Final   MCHC 08/31/2023 32.7  30.0 - 36.0 g/dL Final   RDW 92/75/7974 13.9  11.5 - 15.5 % Final   Platelets 08/31/2023 257  150 - 400 K/uL Final   nRBC 08/31/2023 0.0  0.0 - 0.2 % Final   Neutrophils Relative % 08/31/2023 52  % Final   Neutro Abs 08/31/2023 4.0  1.7 - 7.7 K/uL Final   Lymphocytes Relative 08/31/2023 33  % Final   Lymphs Abs 08/31/2023 2.6  0.7 - 4.0 K/uL Final   Monocytes Relative 08/31/2023 9  % Final   Monocytes Absolute 08/31/2023 0.7  0.1 - 1.0 K/uL Final   Eosinophils Relative 08/31/2023 5  % Final   Eosinophils Absolute 08/31/2023 0.4  0.0 - 0.5 K/uL Final   Basophils Relative 08/31/2023 1  % Final   Basophils Absolute 08/31/2023 0.1  0.0 - 0.1 K/uL Final   Immature Granulocytes 08/31/2023 0  % Final   Abs Immature Granulocytes 08/31/2023 0.01  0.00 - 0.07 K/uL Final   Performed at [facility], [address], [place], [place] [address]   Acetaminophen  (Tylenol ), Serum 08/31/2023 <10 (L)  10 - 30 ug/mL Final   Comment: (NOTE) Therapeutic concentrations vary significantly. A range of 10-30 ug/mL  may be an effective concentration for many patients. However, some  are  best treated at  concentrations outside of this range. Acetaminophen  concentrations >150 ug/mL at 4 hours after ingestion  and >50 ug/mL at 12 hours after ingestion are often associated with  toxic reactions.  Performed at [facility], [address], [place], [place] [address]    Alcohol, Ethyl (B) 08/31/2023 <15  <15 mg/dL Final   Comment: (NOTE) For medical purposes only. Performed at [facility], [address] [place], [place] [address]    Salicylate Lvl 08/31/2023 <7.0 (L)  7.0 - 30.0 mg/dL Final   Performed at [facility], [name] [place], [place] [address]   Opiates 08/31/2023 NONE DETECTED  NONE DETECTED Final   Cocaine 08/31/2023 POSITIVE (A)  NONE DETECTED Final   Benzodiazepines 08/31/2023 NONE DETECTED  NONE DETECTED Final   Amphetamines 08/31/2023 NONE DETECTED  NONE DETECTED Final   Tetrahydrocannabinol 08/31/2023 NONE DETECTED  NONE DETECTED Final   Barbiturates 08/31/2023 NONE DETECTED  NONE DETECTED Final   Comment: (NOTE) DRUG SCREEN FOR MEDICAL PURPOSES ONLY.  IF CONFIRMATION IS NEEDED FOR ANY PURPOSE, NOTIFY LAB WITHIN 5 DAYS.  LOWEST DETECTABLE LIMITS FOR URINE DRUG SCREEN Drug Class                     Cutoff (ng/mL) Amphetamine and metabolites    1000 Barbiturate and metabolites    200 Benzodiazepine                 200 Opiates and metabolites        300 Cocaine and metabolites        300 THC                            50 Performed at [facility], [address], [place], [place] [address]    Specimen Source 08/31/2023 URINE, CLEAN CATCH   Final   Color, Urine 08/31/2023 YELLOW  YELLOW Final   APPearance 08/31/2023 CLEAR  CLEAR Final   Specific Gravity, Urine 08/31/2023 1.024  1.005 - 1.030 Final   pH 08/31/2023 5.0  5.0 - 8.0 Final   Glucose, UA 08/31/2023 NEGATIVE  NEGATIVE mg/dL Final   Hgb urine dipstick 08/31/2023 NEGATIVE  NEGATIVE Final   Bilirubin Urine 08/31/2023 NEGATIVE  NEGATIVE Final   Ketones, ur  08/31/2023 NEGATIVE  NEGATIVE mg/dL Final   Protein, ur 92/75/7974 NEGATIVE  NEGATIVE mg/dL Final   Nitrite 92/75/7974 NEGATIVE  NEGATIVE Final   Leukocytes,Ua 08/31/2023 NEGATIVE  NEGATIVE Final   RBC / HPF 08/31/2023 0-5  0 - 5 RBC/hpf Final   WBC, UA 08/31/2023 0-5  0 - 5 WBC/hpf Final   Comment:        Reflex urine culture not performed if WBC <=10, OR if Squamous epithelial cells >5. If Squamous epithelial cells >5 suggest recollection.    Bacteria, UA 08/31/2023 NONE SEEN  NONE SEEN Final   Squamous Epithelial / HPF 08/31/2023 0-5  0 - 5 /HPF Final   Mucus 08/31/2023 PRESENT   Final   Performed at [facility], [address], [place], [place] [address]  Office Visit on 08/29/2023  Component Date Value Ref Range Status   WBC 08/29/2023 7.1  3.4 - 10.8 x10E3/uL Final   RBC 08/29/2023 4.78  4.14 - 5.80 x10E6/uL Final   Hemoglobin 08/29/2023 13.2  13.0 - 17.7 g/dL Final   Hematocrit 92/77/7974 42.6  37.5 - 51.0 % Final   MCV 08/29/2023 89  79 - 97 fL Final   MCH 08/29/2023 27.6  26.6 - 33.0 pg Final   MCHC 08/29/2023 31.0 (L)  31.5 - 35.7 g/dL Final   RDW 92/77/7974 13.3  11.6 - 15.4 % Final   Platelets 08/29/2023 251  150 - 450 x10E3/uL Final   Neutrophils 08/29/2023 54  Not Estab. % Final   Lymphs 08/29/2023 30  Not Estab. % Final   Monocytes 08/29/2023 11  Not Estab. % Final   Eos 08/29/2023 4  Not Estab. % Final   Basos 08/29/2023 1  Not Estab. % Final   Neutrophils Absolute 08/29/2023 3.8  1.4 - 7.0 x10E3/uL Final   Lymphocytes Absolute 08/29/2023 2.1  0.7 - 3.1 x10E3/uL Final   Monocytes Absolute 08/29/2023 0.8  0.1 - 0.9 x10E3/uL Final   EOS (ABSOLUTE) 08/29/2023 0.3  0.0 - 0.4 x10E3/uL Final   Basophils Absolute 08/29/2023 0.1  0.0 - 0.2 x10E3/uL Final   Immature Granulocytes 08/29/2023 0  Not Estab. % Final   Immature Grans (Abs) 08/29/2023 0.0  0.0 - 0.1 x10E3/uL Final   CRP 08/29/2023 4  0 - 10 mg/L Final   Sed Rate 08/29/2023 35 (H)  0 - 30 mm/hr Final    Glucose 08/29/2023 92  70 - 99 mg/dL Final   BUN 92/77/7974 14  8 - 27 mg/dL Final   Creatinine, Ser 08/29/2023 0.88  0.76 - 1.27 mg/dL Final   eGFR 92/77/7974 98  >59 mL/min/1.73 Final   BUN/Creatinine Ratio 08/29/2023 16  10 - 24 Final   Sodium 08/29/2023 140  134 - 144 mmol/L Final   Potassium 08/29/2023 4.1  3.5 - 5.2 mmol/L Final   Chloride 08/29/2023 102  96 - 106 mmol/L Final   CO2 08/29/2023 20  20 - 29 mmol/L Final   Calcium  08/29/2023 9.2  8.6 - 10.2 mg/dL Final  No results displayed because visit has over 200 results.    Office Visit on 08/10/2023  Component Date Value Ref Range Status   HIV 1 RNA Quant 08/10/2023 2,420 (H)  NOT DETECTED copies/mL Final   HIV-1 RNA Quant, Log 08/10/2023 3.38 (H)  NOT DETECTED Log copies/mL Final   Comment: . This test was performed using Real-Time Polymerase Chain Reaction. . Reportable Range: 20 copies/mL to 10,000,000 copies/mL (1.30 log copies/mL to 7.00 log copies/mL).    WBC 08/10/2023 5.6  3.8 - 10.8 Thousand/uL Final   RBC 08/10/2023 4.98  4.20 - 5.80 Million/uL Final   Hemoglobin 08/10/2023 13.9  13.2 - 17.1 g/dL Final   HCT 92/96/7974 43.7  38.5 - 50.0 % Final   MCV 08/10/2023 87.8  80.0 - 100.0 fL Final   MCH 08/10/2023 27.9  27.0 - 33.0 pg Final   MCHC 08/10/2023 31.8 (L)  32.0 - 36.0 g/dL Final   Comment: For adults, a slight decrease in the calculated MCHC value (in the range of 30 to 32 g/dL) is most likely not clinically significant; however, it should be interpreted with caution in correlation with other red cell parameters and the patient's clinical condition.    RDW 08/10/2023 13.2  11.0 - 15.0 % Final   Platelets 08/10/2023 313  140 - 400 Thousand/uL Final   MPV 08/10/2023 9.7  7.5 - 12.5 fL Final   Glucose, Bld 08/10/2023 96  65 - 99 mg/dL Final   Comment: .            Fasting reference interval .    BUN 08/10/2023 11  7 - 25 mg/dL  Final   Creat 08/10/2023 0.77  0.70 - 1.35 mg/dL Final   BUN/Creatinine  Ratio 08/10/2023 SEE NOTE:  6 - 22 (calc) Final   Comment:    Not Reported: BUN and Creatinine are within    reference range. .    Sodium 08/10/2023 140  135 - 146 mmol/L Final   Potassium 08/10/2023 3.6  3.5 - 5.3 mmol/L Final   Chloride 08/10/2023 104  98 - 110 mmol/L Final   CO2 08/10/2023 27  20 - 32 mmol/L Final   Calcium  08/10/2023 9.2  8.6 - 10.3 mg/dL Final   Total Protein 92/96/7974 7.4  6.1 - 8.1 g/dL Final   Albumin 92/96/7974 4.3  3.6 - 5.1 g/dL Final   Globulin 92/96/7974 3.1  1.9 - 3.7 g/dL (calc) Final   AG Ratio 08/10/2023 1.4  1.0 - 2.5 (calc) Final   Total Bilirubin 08/10/2023 0.3  0.2 - 1.2 mg/dL Final   Alkaline phosphatase (APISO) 08/10/2023 75  35 - 144 U/L Final   AST 08/10/2023 29  10 - 35 U/L Final   ALT 08/10/2023 27  9 - 46 U/L Final    Allergies: Patient has no known allergies.  Medications:  Facility Ordered Medications  Medication   bictegravir-emtricitabine -tenofovir  AF (BIKTARVY ) 50-200-25 MG per tablet 1 tablet   acetaminophen  (TYLENOL ) tablet 650 mg   alum & mag hydroxide-simeth (MAALOX/MYLANTA) 200-200-20 MG/5ML suspension 30 mL   magnesium  hydroxide (MILK OF MAGNESIA) suspension 30 mL   OLANZapine  zydis (ZYPREXA ) disintegrating tablet 5 mg   OLANZapine  (ZYPREXA ) injection 5 mg   hydrOXYzine  (ATARAX ) tablet 25 mg   traZODone  (DESYREL ) tablet 50 mg   cloNIDine  (CATAPRES ) tablet 0.1 mg   hydrocerin (EUCERIN) cream   thiamine  (VITAMIN B1) tablet 100 mg   multivitamin with minerals tablet 1 tablet   LORazepam  (ATIVAN ) tablet 1 mg   loperamide  (IMODIUM ) capsule 2-4 mg   ondansetron  (ZOFRAN -ODT) disintegrating tablet 4 mg   PTA Medications  Medication Sig   rosuvastatin  (CRESTOR ) 10 MG tablet Take 1 tablet (10 mg total) by mouth daily. (Patient not taking: No sig reported)   bictegravir-emtricitabine -tenofovir  AF (BIKTARVY ) 50-200-25 MG TABS tablet Take 1 tablet by mouth daily.    Long Term Goals: Improvement in symptoms so as ready for  discharge  Short Term Goals: Patient will verbalize feelings in meetings with treatment team members., Patient will attend at least of 50% of the groups daily., Pt will complete the PHQ9 on admission, day 3 and discharge., Patient will participate in completing the [place] Suicide Severity Rating Scale, Patient will score a low risk of violence for 24 hours prior to discharge, and Patient will take medications as prescribed daily.  Medical Decision Making  Patient presents to the encounter with CC: complains of polysubstance abuse along with suicidal and homicidal ideations. Has homicidal ideations towards his crack the other. States he feels suicidal but without a plan. He states that he is hoping to enter a long-term rehabilitation program after completing detox.  Prior to patient being admitted to [facility], admission labs ordered and drawn at [facility].  CBC w/ differential/platelet -CMP -Ethanol -Hemoglobin A1c -TSH -HA1C -Lipid Panel -Magnesium  -UDS  -EKG 12-Lead -Agitation protocol  -CIWA protocol  UDS + amphetamines and cocaine, ETOH is within normal limit  Home Meds restarted yesterday :  Biktarvy   PRN Medications: -Continue -Tylenol , 650mg , oral, every 6 hours PRN, mild pain -Continue -Maalox/Mylanta 30mL, oral, every 4 hours PRN, indigestion -Continue -Atarax  25mg , oral, 3 times daily PRN, anxiety -  Continue -Milk of Magnesia, 30 mL, oral, daily PRN, mild constipation -Continue -Desyrel , 50mg , oral, at bedtime PRN, sleep -Continue -Benadryl  50mg  at Q8HRS PO PRN - allergies -Continue Imodium  2 to 4 mg as needed for diarrhea or loose stools -Continue -Thiamine  100mg  PO Daily-  Prevent  Wernicke-Korsakoff syndrome -Continue Zofran  4mg  disintegrating for N/V PRN  Continue Meds already restarted Eucerin topical apply BID - affected rash Clonidine  0.1mg  PO BID - HTN SBP >160, DBP>100  Discharge Planning:             -- Social work and case management to assist with discharge  planning and identification of hospital follow-up needs prior to discharge             -- Estimated LOS: 2-3 days             -- Discharge Concerns: Need to establish a safety plan; Medication compliance and effectiveness             -- Discharge Goals: Substance rehabilitation and F/U with outpatient providers/referrals for mental health follow-up including medication management/psychotherapy/primary care services.  Recommendations  Based on my evaluation the patient does not appear to have an emergency medical condition.  Tosin Greig Altergott, NP 01/25/2024  12:37 PM

## 2024-01-25 NOTE — ED Notes (Signed)
 Patient is currently sitting in the dayroom watching television, patients respirations are even and unlabored, patient appears to be in no acute distress, will continue to monitor patient for safety.

## 2024-01-25 NOTE — ED Notes (Signed)
 Patient is in the dayroom calm and composed having snacks with other patients NAD, Respirations even and unlabored. Passive SI with no plan. Environment secured per policy. Will monitor for safety.

## 2024-01-25 NOTE — Progress Notes (Signed)
 Pt awake and alert. Denies SI, HI or AVS.  Took medications without difficulty.  Visible in milieu and eating breakfast. No apparent distress.

## 2024-01-25 NOTE — Group Note (Signed)
 Group Topic: Recovery Basics  Group Date: 01/25/2024 Start Time: 1525 End Time: 1545 Facilitators: Marikay Briant SAILOR, RN  Department: [facility]  Number of Participants: 5  Group Focus: nursing group Treatment Modality:  Solution-Focused Therapy Interventions utilized were assignment Purpose: relapse prevention strategies  Patients were given a short autobiography by Garrie Moose, related to addiction and recovery. Patients were asked to read the autobiography and consider how her journey is similar or differs from their recovery journey. After giving the autobiography some thoughts, the patients were asked to answer the related questions. The take away from this activity  Your life doesn't get better by chance, it gets better by CHANGE. Name: DEVYON KEATOR Date of Birth: 03/27/1960  MR: [id number]    Level of Participation: active Quality of Participation: attentive Interactions with others: gave feedback Mood/Affect: appropriate Triggers (if applicable): None Cognition: goal directed Progress: Significant Response: Patient receptive Plan: referral / recommendations  Patients Problems:  Patient Active Problem List   Diagnosis Date Noted   Polysubstance abuse (HCC) 01/24/2024   Stimulant abuse (HCC) 01/24/2024   Substance induced mood disorder (HCC) 01/24/2024   Alcohol use 01/24/2024   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   Hepatitis C antibody detected 09/03/2023   Cocaine abuse with cocaine-induced mood disorder (HCC) 09/02/2023   Dilatation of aorta ~75mm  (HCC) 08/18/2023   Staphylococcus aureus bacteremia 08/17/2023   Cellulitis of right leg 08/16/2023   Healthcare maintenance 07/14/2023   Cellulitis of right lower extremity 02/12/2023   Eczematous dermatitis 02/06/2023   Skin ulcers of foot, bilateral (HCC), R> L, painful 02/05/2023   Pure hypercholesterolemia 09/01/2022   Dysphagia 01/12/2021   Gastritis 01/12/2021    Screening for colon cancer 05/25/2020   Hypertension 03/27/2019   GERD with esophagitis 09/01/2016   Cigarette smoker 12/23/2014   Seasonal allergies 01/22/2013   Human immunodeficiency virus (HIV) disease (HCC) 07/10/2006   Alcohol use disorder, moderate, dependence (HCC) 07/10/2006

## 2024-01-25 NOTE — Group Note (Signed)
 Group Topic: Emotional Regulation  Group Date: 01/25/2024 Start Time: 1300 End Time: 1345 Facilitators: Laneta Renea POUR, NT; Deidre Prentis CROME, NT  Department: [facility]  Number of Participants: 4  Group Focus: communication, coping skills, and psychiatric education Treatment Modality:  Psychoeducation Interventions utilized were group exercise and problem solving Purpose: enhance coping skills and increase insight  Name: NNAMDI DACUS Date of Birth: 05/11/1960  MR: [id number]    Level of Participation: active Quality of Participation: cooperative and motivated Interactions with others: gave feedback Mood/Affect: appropriate and positive Triggers (if applicable): None Cognition: coherent/clear and insightful Progress: Gaining insight Response:  It's a long and difficult road.  Plan: patient will be encouraged to attend group.   Patients Problems:  Patient Active Problem List   Diagnosis Date Noted   Polysubstance abuse (HCC) 01/24/2024   Stimulant abuse (HCC) 01/24/2024   Substance induced mood disorder (HCC) 01/24/2024   Alcohol use 01/24/2024   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   Hepatitis C antibody detected 09/03/2023   Cocaine abuse with cocaine-induced mood disorder (HCC) 09/02/2023   Dilatation of aorta ~71mm  (HCC) 08/18/2023   Staphylococcus aureus bacteremia 08/17/2023   Cellulitis of right leg 08/16/2023   Healthcare maintenance 07/14/2023   Cellulitis of right lower extremity 02/12/2023   Eczematous dermatitis 02/06/2023   Skin ulcers of foot, bilateral (HCC), R> L, painful 02/05/2023   Pure hypercholesterolemia 09/01/2022   Dysphagia 01/12/2021   Gastritis 01/12/2021   Screening for colon cancer 05/25/2020   Hypertension 03/27/2019   GERD with esophagitis 09/01/2016   Cigarette smoker 12/23/2014   Seasonal allergies 01/22/2013   Human immunodeficiency virus (HIV) disease (HCC) 07/10/2006   Alcohol use  disorder, moderate, dependence (HCC) 07/10/2006

## 2024-01-25 NOTE — Progress Notes (Addendum)
 Patient is currently resting at this time.  No acute distress noted.  Respirations present, even and unlabored.  No issues noted.  Will continue Q 15 min safety checks for safety/behavior per facility protocol.

## 2024-01-25 NOTE — ED Notes (Signed)
 Patient is in the bedroom sleeping, NAD Environment secured per policy. Will continue to monitor for safety

## 2024-01-25 NOTE — Care Management (Signed)
 [facility] Care Management...   Clinical Research Associate spoke with [facility]). The client has been accepted into their program and they will provide transportation. They report they will pick the client up by tomorrow afternoon. Writer encouraged before 11am but they report they could not guarantee by that time.  [facility] reports the driver will call in the morning to give ETA prior to leaving their facility.  [facility] will transport the client to their facility at:  Address: [address] [place], [place] [address] Phone: [phone]  Client will need 7 day supply and scripts.

## 2024-01-26 DIAGNOSIS — F1020 Alcohol dependence, uncomplicated: Secondary | ICD-10-CM | POA: Diagnosis not present

## 2024-01-26 DIAGNOSIS — I10 Essential (primary) hypertension: Secondary | ICD-10-CM

## 2024-01-26 DIAGNOSIS — B20 Human immunodeficiency virus [HIV] disease: Secondary | ICD-10-CM | POA: Diagnosis not present

## 2024-01-26 DIAGNOSIS — F1590 Other stimulant use, unspecified, uncomplicated: Secondary | ICD-10-CM | POA: Diagnosis not present

## 2024-01-26 MED ORDER — AMLODIPINE BESYLATE 5 MG PO TABS
5.0000 mg | ORAL_TABLET | Freq: Once | ORAL | Status: AC
  Administered 2024-01-26: 5 mg via ORAL
  Filled 2024-01-26: qty 1

## 2024-01-26 MED ORDER — BICTEGRAVIR-EMTRICITAB-TENOFOV 50-200-25 MG PO TABS: 1.0000 | ORAL_TABLET | Freq: Every day | ORAL | 0 refills | Status: DC

## 2024-01-26 MED ORDER — AMLODIPINE BESYLATE 5 MG PO TABS: 5.0000 mg | ORAL_TABLET | Freq: Every day | ORAL | 0 refills | Status: DC

## 2024-01-26 NOTE — ED Provider Notes (Signed)
 [facility] ASAP Discharge Summary  Date and Time: 01/26/2024 10:34 AM  Name: [name]  MRN:  [id number]   Discharge Diagnoses:  Final diagnoses:  Primary hypertension  Human immunodeficiency virus (HIV) disease (HCC)  Alcohol use disorder, moderate, dependence (HCC)  Stimulant use disorder   Subjective: [name] is feeling pretty alright this morning. He is eager to discharge to [name]. It is his first experience with the facility but he is hopeful. He did not sleep well due to experiencing pain/tingling in right hand but he believes he may have slept on it. Supportive treatment overnight from nursing (PRN) may have helped. He is aware of HTN and claims his PCP discontinued HTN medication due to potential interaction with Biktarvy . Patient requested MD address this issue by selecting an appropriate anti-hypertensive.   Stay Summary: 63 year old male initially presented on 12/17 to [facility] complains of polysubstance abuse along with suicidal and homicidal ideations.  Has homicidal ideations towards his crack?  States he feels suicidal but without a plan.  No prior history of suicide before in the past.  Does have occasional alcohol use.  History of paranoid schizophrenia and has been hearing voices but denies responding to them.  Patient subsequently admitted 12/18 to [name] for multimodal treatment for polysubstance use disorder (amphetamine, cocaine). Patient quickly adjusted to milieu. The patient was evaluated each day by a clinical provider to ascertain response to treatment. Patient declined initiation of neuropsychiatric medication but endorse desire for residential substance treatment.  Improvement was noted by the patient's report of decreasing symptoms, improved sleep and appetite, affect, behavior, and participation in unit programming. Patient's care was discussed during the interdisciplinary team meeting every day during the hospitalization. Patient was asked each  day to complete a self inventory noting mood, mental status, pain, new symptoms, anxiety and concerns. Labs were reviewed with the patient, and abnormal results were discussed with the patient. Patient accepted to [name] 12/18 for admission on 12/19.   Symptoms were reported as significantly decreased by discharge. Somatic medication regimen (Biktarvy ) well-tolerated.Amlodipine initiated at discharge due to h/o HIV.     Total Time spent with patient: I personally spent 20 minutes on the unit in direct patient care. The direct patient care time included face-to-face time with the patient, reviewing the patient's chart, communicating with other professionals, and coordinating care. Greater than 50% of this time was spent in counseling or coordinating care with the patient regarding goals of hospitalization, psycho-education, and discharge planning needs.   On my assessment the patient denied SI, HI, AVH, paranoia, ideas of reference, or first rank symptoms on day of discharge. Patient denied drug cravings or active signs of withdrawal. Patient denied medication side-effects. Patient was not deemed to be a danger to self or others on day of discharge and was in agreement with discharge plans.   Past Psychiatric History: Paranoid schizophrenia (AH exclusively), alcohol induce mood disorder, suicidal ideation.   Is the patient at risk to self? Yes Has the patient been a risk to self in the past 6 months? Yes   Has the patient been a risk to self within the distant past? Yes Is the patient a risk to others? No  Has the patient been a risk to others in the past 6 months? No   Has the patient been a risk to others within the distant past? No    Past Medical History: HIV, B/L arm Rash, HTN, GERD, dyslipidemia Family History: brother (AUD) Social History: Widower  in 2016 following the passing of his wife. He has no children. 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 He lives  with his older brother and has a high education officer, community.   Current Medications:  Current Facility-Administered Medications  Medication Dose Route Frequency Provider Last Rate Last Admin   acetaminophen  (TYLENOL ) tablet 650 mg  650 mg Oral Q6H PRN White, Patrice L, NP   650 mg at 01/26/24 0044   alum & mag hydroxide-simeth (MAALOX/MYLANTA) 200-200-20 MG/5ML suspension 30 mL  30 mL Oral Q4H PRN White, Patrice L, NP       amLODipine  (NORVASC ) tablet 5 mg  5 mg Oral Once Islah Eve C, MD       bictegravir-emtricitabine -tenofovir  AF (BIKTARVY ) 50-200-25 MG per tablet 1 tablet  1 tablet Oral Daily White, Patrice L, NP   1 tablet at 01/26/24 0900   cloNIDine  (CATAPRES ) tablet 0.1 mg  0.1 mg Oral BID PRN White, Patrice L, NP       hydrocerin (EUCERIN) cream   Topical BID White, Patrice L, NP   Given at 01/26/24 0901   hydrOXYzine  (ATARAX ) tablet 25 mg  25 mg Oral TID PRN White, Patrice L, NP   25 mg at 01/26/24 0044   loperamide  (IMODIUM ) capsule 2-4 mg  2-4 mg Oral PRN White, Patrice L, NP       LORazepam  (ATIVAN ) tablet 1 mg  1 mg Oral Q6H PRN White, Patrice L, NP       magnesium  hydroxide (MILK OF MAGNESIA) suspension 30 mL  30 mL Oral Daily PRN White, Patrice L, NP       multivitamin with minerals tablet 1 tablet  1 tablet Oral Daily White, Patrice L, NP   1 tablet at 01/26/24 9097   OLANZapine  (ZYPREXA ) injection 5 mg  5 mg Intramuscular TID PRN White, Patrice L, NP       OLANZapine  zydis (ZYPREXA ) disintegrating tablet 5 mg  5 mg Oral TID PRN White, Patrice L, NP       ondansetron  (ZOFRAN -ODT) disintegrating tablet 4 mg  4 mg Oral Q6H PRN White, Patrice L, NP       thiamine  (VITAMIN B1) tablet 100 mg  100 mg Oral Daily White, Patrice L, NP   100 mg at 01/26/24 9097   traZODone  (DESYREL ) tablet 50 mg  50 mg Oral QHS PRN White, Patrice L, NP       Current Outpatient Medications  Medication Sig Dispense Refill   amLODipine  (NORVASC ) 5 MG tablet Take 1 tablet (5 mg total) by mouth daily. 30  tablet 0   bictegravir-emtricitabine -tenofovir  AF (BIKTARVY ) 50-200-25 MG TABS tablet Take 1 tablet by mouth daily. 30 tablet 0    PTA Medications:  Facility Ordered Medications  Medication   bictegravir-emtricitabine -tenofovir  AF (BIKTARVY ) 50-200-25 MG per tablet 1 tablet   acetaminophen  (TYLENOL ) tablet 650 mg   alum & mag hydroxide-simeth (MAALOX/MYLANTA) 200-200-20 MG/5ML suspension 30 mL   magnesium  hydroxide (MILK OF MAGNESIA) suspension 30 mL   OLANZapine  zydis (ZYPREXA ) disintegrating tablet 5 mg   OLANZapine  (ZYPREXA ) injection 5 mg   hydrOXYzine  (ATARAX ) tablet 25 mg   traZODone  (DESYREL ) tablet 50 mg   cloNIDine  (CATAPRES ) tablet 0.1 mg   hydrocerin (EUCERIN) cream   thiamine  (VITAMIN B1) tablet 100 mg   multivitamin with minerals tablet 1 tablet   LORazepam  (ATIVAN ) tablet 1 mg   loperamide  (IMODIUM ) capsule 2-4 mg   ondansetron  (ZOFRAN -ODT) disintegrating tablet 4 mg   amLODipine  (NORVASC ) tablet 5 mg  PTA Medications  Medication Sig   bictegravir-emtricitabine -tenofovir  AF (BIKTARVY ) 50-200-25 MG TABS tablet Take 1 tablet by mouth daily.   amLODipine  (NORVASC ) 5 MG tablet Take 1 tablet (5 mg total) by mouth daily.       01/26/2024   10:19 AM 09/06/2023    9:57 AM 08/29/2023   10:11 AM  Depression screen PHQ 2/9  Decreased Interest 0 0 0  Down, Depressed, Hopeless 1 0 0  PHQ - 2 Score 1 0 0  Altered sleeping 1    Tired, decreased energy 1    Change in appetite 1    Feeling bad or failure about yourself  0    Trouble concentrating 1    Moving slowly or fidgety/restless 1    Suicidal thoughts 1    PHQ-9 Score 7      Flowsheet Row ED from 01/24/2024 in [facility] Most recent reading at 01/24/2024  5:55 PM ED from 01/24/2024 in [facility] Emergency Department at [facility] Most recent reading at 01/24/2024 12:24 PM ED from 09/01/2023 in [facility] Most recent reading at 09/03/2023 10:43  AM  C-SSRS RISK CATEGORY Moderate Risk High Risk No Risk    Musculoskeletal  Strength & Muscle Tone: within normal limits Gait & Station: normal Patient leans: N/A  Psychiatric Specialty Exam  Presentation  General Appearance:  Appropriate for Environment  Eye Contact: Good  Speech: Clear and Coherent  Speech Volume: Normal  Handedness: Right   Mood and Affect  Mood: Euthymic  Affect: Congruent   Thought Process  Thought Processes: Coherent  Descriptions of Associations:Intact  Orientation:Full (Time, Place and Person)  Thought Content:Logical  Diagnosis of Schizophrenia or Schizoaffective disorder in past: No    Hallucinations:Hallucinations: None  Ideas of Reference:None  Suicidal Thoughts:Suicidal Thoughts: No  Homicidal Thoughts:Homicidal Thoughts: No   Sensorium  Memory: Immediate Good; Recent Fair  Judgment: Good  Insight: Fair   Chartered Certified Accountant: Fair  Attention Span: Fair  Recall: [employer] of Knowledge: Fair  Language: Fair   Psychomotor Activity  Psychomotor Activity: Psychomotor Activity: Normal   Assets  Assets: Communication Skills; Desire for Improvement; Financial Resources/Insurance; Leisure Time; Resilience   Sleep  Sleep: Sleep: Fair (right arm pain disturbed sleep)  Estimated Sleeping Duration (Last 24 Hours): 8.75-11.25 hours  No data recorded  Physical Exam  Physical Exam ROS Blood pressure 117/83, pulse 80, temperature 98 F (36.7 C), temperature source Oral, resp. rate 19, SpO2 97%. There is no height or weight on file to calculate BMI.  Demographic Factors:  Divorced or widowed  Loss Factors: Decline in physical health  Historical Factors: Impulsivity  Risk Reduction Factors:   Employed and Living with another person, especially a relative  Continued Clinical Symptoms:  Alcohol/Substance Abuse/Dependencies Schizophrenia:   Paranoid or undifferentiated  type More than one psychiatric diagnosis Medical Diagnoses and Treatments/Surgeries  Cognitive Features That Contribute To Risk:  None    Suicide Risk:  Minimal: No identifiable suicidal ideation.  Patients presenting with no risk factors but with morbid ruminations; may be classified as minimal risk based on the severity of the depressive symptoms  Plan Of Care/Follow-up recommendations:  Activity: Daily physical activity; reduce sedentary behaviors   Diet: Portion-limited diet rich in produce, whole (minimally processed) grains, nuts/seeds, eggs, beans/legumes, seafood, lowfat dairy (if tolerated), fermented food, lean meat. Copious water intake. Limit (ultra)processed foods and sugar-sweetened beverages.    Other: -Follow-up with your outpatient psychiatric provider -instructions  on appointment date, time, and address (location) are provided to you in discharge paperwork. Discharge to [facility].   -Follow-up with outpatient primary care doctor to optimize health maintenance/preventative care and other specialists -for management of chronic medical disease. Continue Biktarvy , amlodipine.  -Recommend total abstinence from alcohol, tobacco, and other illicit drug use at discharge.    -If your psychiatric symptoms recur, worsen, or if you have side effects to your psychiatric medications, call your outpatient psychiatric provider, 911, [phone] or go to the nearest emergency department.   -If suicidal thoughts occur, immediately call your outpatient psychiatric provider, 911, [phone] or go to the nearest emergency department.  Disposition: Discharge for transport to [facility]  KANDI JAYSON HAHN, MD 01/26/2024, 10:34 AM

## 2024-01-26 NOTE — Discharge Instructions (Addendum)
 [facility] Care Management...  Patient requested inpatient treatment  Patient can discharge Friday 12.19.2025  Patient has been accepted to.[facility] [address] [place], [place] [address]  [phone]  [facility] will provide transportation  30 day scripts

## 2024-01-26 NOTE — Group Note (Signed)
 Group Topic: Change and Accountability  Group Date: 01/25/2024 Start Time: 1000 End Time: 1050 Facilitators: Gerome Jolly, NT  Department: [facility]  Number of Participants: 3  Group Focus: acceptance Treatment Modality:  Cognitive Behavioral Therapy and Solution-Focused Therapy Interventions utilized were other acceptance and commitment and support Purpose: Facilitator engaged the group in a activity worksheets Circle of Control helping clients distinguish between what they can and cannot control, explore maladaptive thinking, increase insight, relapse prevention strategies  Name: William [name] Date of Birth: 1961-01-03  MR: [id number]    Level of Participation: active Quality of Participation: attentive and engaged Interactions with others: gave feedback Mood/Affect: appropriate and positive Triggers (if applicable): na Cognition: coherent/clear and goal directed Progress: Significant Response: Patient identified situations that he could change and those where did not have any control.   Plan: referral / recommendations Patient has been accepted to [facility] for inpatient treatment  Patients Problems:  Patient Active Problem List   Diagnosis Date Noted   Polysubstance abuse (HCC) 01/24/2024   Stimulant abuse (HCC) 01/24/2024   Substance induced mood disorder (HCC) 01/24/2024   Alcohol use 01/24/2024   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   Hepatitis C antibody detected 09/03/2023   Cocaine abuse with cocaine-induced mood disorder (HCC) 09/02/2023   Dilatation of aorta ~75mm  (HCC) 08/18/2023   Staphylococcus aureus bacteremia 08/17/2023   Cellulitis of right leg 08/16/2023   Healthcare maintenance 07/14/2023   Cellulitis of right lower extremity 02/12/2023   Eczematous dermatitis 02/06/2023   Skin ulcers of foot, bilateral (HCC), R> L, painful 02/05/2023   Pure hypercholesterolemia 09/01/2022   Dysphagia  01/12/2021   Gastritis 01/12/2021   Screening for colon cancer 05/25/2020   Hypertension 03/27/2019   GERD with esophagitis 09/01/2016   Cigarette smoker 12/23/2014   Seasonal allergies 01/22/2013   Human immunodeficiency virus (HIV) disease (HCC) 07/10/2006   Alcohol use disorder, moderate, dependence (HCC) 07/10/2006

## 2024-01-26 NOTE — Group Note (Signed)
 Group Topic: Emotional Regulation  Group Date: 01/26/2024 Start Time: 2000 End Time: 2030 Facilitators: Verdon Jacqualyn BRAVO, NT  Department: [facility]  Number of Participants: 6  Group Focus: daily focus Treatment Modality:  Individual Therapy Interventions utilized were group exercise Purpose: express feelings  Name: William [name] Date of Birth: 30-Jul-1960  MR: [id number]    Level of Participation: active Quality of Participation: cooperative Interactions with others: gave feedback Mood/Affect: appropriate Triggers (if applicable):  Cognition: coherent/clear Progress: Moderate Response: n/a Plan: follow-up needed  Patients Problems:  Patient Active Problem List   Diagnosis Date Noted   Polysubstance abuse (HCC) 01/24/2024   Stimulant abuse (HCC) 01/24/2024   Substance induced mood disorder (HCC) 01/24/2024   Alcohol use 01/24/2024   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   Hepatitis C antibody detected 09/03/2023   Cocaine abuse with cocaine-induced mood disorder (HCC) 09/02/2023   Dilatation of aorta ~23mm  (HCC) 08/18/2023   Staphylococcus aureus bacteremia 08/17/2023   Cellulitis of right leg 08/16/2023   Healthcare maintenance 07/14/2023   Cellulitis of right lower extremity 02/12/2023   Eczematous dermatitis 02/06/2023   Skin ulcers of foot, bilateral (HCC), R> L, painful 02/05/2023   Pure hypercholesterolemia 09/01/2022   Dysphagia 01/12/2021   Gastritis 01/12/2021   Screening for colon cancer 05/25/2020   Hypertension 03/27/2019   GERD with esophagitis 09/01/2016   Cigarette smoker 12/23/2014   Seasonal allergies 01/22/2013   Human immunodeficiency virus (HIV) disease (HCC) 07/10/2006   Alcohol use disorder, moderate, dependence (HCC) 07/10/2006

## 2024-01-26 NOTE — Care Management (Signed)
 [facility] Care Management...  Patient requested inpatient treatment  Patient can discharge Friday 12.19.2025  Patient has been accepted to.[facility] [address] [place], [place] [address]  [phone]  [facility] will provide transportation  30 day scripts

## 2024-01-26 NOTE — ED Notes (Signed)
 Woke up c/o pain and tingling sensation in the right arm.

## 2024-01-26 NOTE — ED Notes (Signed)
 Patient A&Ox4. Denies intent to harm self/others when asked. Denies A/VH. Patient denies any physical complaints when asked. No acute distress noted. Pt verbalized anticipation to going to [facility] this morning. Support and encouragement provided. Routine safety checks conducted according to facility protocol. Encouraged patient to notify staff if thoughts of harm toward self or others arise. Patient verbalize understanding and agreement. Will continue to monitor for safety.

## 2024-01-26 NOTE — ED Notes (Signed)
 Patient A&O x 4, ambulatory. Patient discharged in no acute distress. Patient denied SI/HI, A/VH upon discharge. Patient verbalized understanding of all discharge instructions reviewed on AVS via staff, to include follow up appointments, RX's and safety. Suicide safety plan completed and reviewed with clinical research associate. A copy given to pt. Patient reported mood 10/10.  Pt belongings returned to patient from locker #8 intact. Patient escorted to lobby via staff for transport to [facility]. Safety maintained.

## 2024-01-26 NOTE — ED Notes (Signed)
 Patient asleep.

## 2024-01-26 NOTE — Care Plan (Signed)
 Interdisciplinary Treatment and Diagnostic Plan Update  01/26/2024 Time of Session: 9am CAELUM FEDERICI MRN: [id number]  Diagnosis:  Final diagnoses:  Primary hypertension  Human immunodeficiency virus (HIV) disease (HCC)  Alcohol use disorder, moderate, dependence (HCC)  Stimulant use disorder     Current Medications:  Current Facility-Administered Medications  Medication Dose Route Frequency Provider Last Rate Last Admin   acetaminophen  (TYLENOL ) tablet 650 mg  650 mg Oral Q6H PRN White, Patrice L, NP   650 mg at 01/26/24 0044   alum & mag hydroxide-simeth (MAALOX/MYLANTA) 200-200-20 MG/5ML suspension 30 mL  30 mL Oral Q4H PRN White, Patrice L, NP       bictegravir-emtricitabine -tenofovir  AF (BIKTARVY ) 50-200-25 MG per tablet 1 tablet  1 tablet Oral Daily White, Patrice L, NP   1 tablet at 01/26/24 0900   cloNIDine  (CATAPRES ) tablet 0.1 mg  0.1 mg Oral BID PRN White, Patrice L, NP       hydrocerin (EUCERIN) cream   Topical BID White, Patrice L, NP   Given at 01/26/24 0901   hydrOXYzine  (ATARAX ) tablet 25 mg  25 mg Oral TID PRN White, Patrice L, NP   25 mg at 01/26/24 0044   loperamide  (IMODIUM ) capsule 2-4 mg  2-4 mg Oral PRN White, Patrice L, NP       LORazepam  (ATIVAN ) tablet 1 mg  1 mg Oral Q6H PRN White, Patrice L, NP       magnesium  hydroxide (MILK OF MAGNESIA) suspension 30 mL  30 mL Oral Daily PRN White, Patrice L, NP       multivitamin with minerals tablet 1 tablet  1 tablet Oral Daily White, Patrice L, NP   1 tablet at 01/26/24 9097   OLANZapine  (ZYPREXA ) injection 5 mg  5 mg Intramuscular TID PRN White, Patrice L, NP       OLANZapine  zydis (ZYPREXA ) disintegrating tablet 5 mg  5 mg Oral TID PRN White, Patrice L, NP       ondansetron  (ZOFRAN -ODT) disintegrating tablet 4 mg  4 mg Oral Q6H PRN White, Patrice L, NP       thiamine  (VITAMIN B1) tablet 100 mg  100 mg Oral Daily White, Patrice L, NP   100 mg at 01/26/24 9097   traZODone  (DESYREL ) tablet 50 mg  50 mg Oral QHS PRN  White, Patrice L, NP       Current Outpatient Medications  Medication Sig Dispense Refill   amLODipine (NORVASC) 5 MG tablet Take 1 tablet (5 mg total) by mouth daily. 30 tablet 0   bictegravir-emtricitabine -tenofovir  AF (BIKTARVY ) 50-200-25 MG TABS tablet Take 1 tablet by mouth daily. 30 tablet 0   PTA Medications: Prior to Admission medications  Medication Sig Start Date End Date Taking? Authorizing Provider  amLODipine (NORVASC) 5 MG tablet Take 1 tablet (5 mg total) by mouth daily. 01/26/24   Cole Kandi BROCKS, MD  bictegravir-emtricitabine -tenofovir  AF (BIKTARVY ) 50-200-25 MG TABS tablet Take 1 tablet by mouth daily. 01/26/24   Cole Kandi BROCKS, MD    Patient Stressors: Substance abuse    Patient Strengths: Manufacturing systems engineer  Motivation for treatment/growth  Religious Affiliation   Treatment Modalities: Medication Management, Group therapy, Case management,  1 to 1 session with clinician, Psychoeducation, Recreational therapy.   Physician Treatment Plan for Primary and Secondary Diagnosis:  Final diagnoses:  Primary hypertension  Human immunodeficiency virus (HIV) disease (HCC)  Alcohol use disorder, moderate, dependence (HCC)  Stimulant use disorder   Long Term Goal(s): Improvement in symptoms so as ready for  discharge  Short Term Goals: Patient will verbalize feelings in meetings with treatment team members. Patient will attend at least of 50% of the groups daily. Pt will complete the PHQ9 on admission, day 3 and discharge. Patient will participate in completing the [place] Suicide Severity Rating Scale Patient will score a low risk of violence for 24 hours prior to discharge Patient will take medications as prescribed daily.  Medication Management: Evaluate patient's response, side effects, and tolerance of medication regimen.  Therapeutic Interventions: 1 to 1 sessions, Unit Group sessions and Medication administration.  Evaluation of Outcomes:  Progressing  LCSW Treatment Plan for Primary Diagnosis:  Final diagnoses:  Primary hypertension  Human immunodeficiency virus (HIV) disease (HCC)  Alcohol use disorder, moderate, dependence (HCC)  Stimulant use disorder    Long Term Goal(s): Safe transition to appropriate next level of care at discharge.  Short Term Goals: Facilitate acceptance of mental health diagnosis and concerns through verbal commitment to aftercare plan and appointments at discharge.  Therapeutic Interventions: Assess for all discharge needs, 1 to 1 time with Child psychotherapist, Explore available resources and support systems, Assess for adequacy in community support network, Educate family and significant other(s) on suicide prevention, Complete Psychosocial Assessment, Interpersonal group therapy.  Evaluation of Outcomes: Progressing   Progress in Treatment: Attending groups: Yes. Participating in groups: Yes. Taking medication as prescribed: Yes. Toleration medication: No. Family/Significant other contact made: No Patient understands diagnosis: Yes. Discussing patient identified problems/goals with staff: Yes. Medical problems stabilized or resolved: Yes. Denies suicidal/homicidal ideation: Yes. Issues/concerns per patient self-inventory: Yes. and No. Other: Client was transferred to [facility] for ongoing treatment.   New problem(s) identified: No, Describe:     New Short Term/Long Term Goal(s):  Client will continue treatment at [facility] to gain sobriety   Patient Goals:  Identify how alcohol is impacting my life and enter into residential.   Discharge Plan or Barriers: none  Reason for Continuation of Hospitalization: Other; describe Client will be transferred to [facility] today.  Estimated Length of Stay: 1 day  Last 3 [place] Suicide Severity Risk Score: Flowsheet Row ED from 01/24/2024 in [facility] Most recent reading at 01/24/2024  5:55 PM ED from  01/24/2024 in [facility] Emergency Department at [facility] Most recent reading at 01/24/2024 12:24 PM ED from 09/01/2023 in [facility] Most recent reading at 09/03/2023 10:43 AM  C-SSRS RISK CATEGORY Moderate Risk High Risk No Risk    Last PHQ 2/9 Scores:    01/26/2024   10:19 AM 09/06/2023    9:57 AM 08/29/2023   10:11 AM  Depression screen PHQ 2/9  Decreased Interest 0 0 0  Down, Depressed, Hopeless 1 0 0  PHQ - 2 Score 1 0 0  Altered sleeping 1    Tired, decreased energy 1    Change in appetite 1    Feeling bad or failure about yourself  0    Trouble concentrating 1    Moving slowly or fidgety/restless 1    Suicidal thoughts 1    PHQ-9 Score 7      Scribe for Treatment Team: Chai Routh, LCSW 01/26/2024 3:42 PM

## 2024-02-28 ENCOUNTER — Other Ambulatory Visit: Payer: Self-pay

## 2024-02-28 ENCOUNTER — Other Ambulatory Visit (HOSPITAL_COMMUNITY): Payer: Self-pay

## 2024-02-28 ENCOUNTER — Other Ambulatory Visit: Payer: Self-pay | Admitting: Internal Medicine

## 2024-02-28 DIAGNOSIS — B20 Human immunodeficiency virus [HIV] disease: Secondary | ICD-10-CM

## 2024-02-28 MED ORDER — BIKTARVY 50-200-25 MG PO TABS
1.0000 | ORAL_TABLET | Freq: Every day | ORAL | 3 refills | Status: AC
  Filled 2024-02-28: qty 30, 30d supply, fill #0

## 2024-03-04 ENCOUNTER — Ambulatory Visit: Payer: Self-pay | Admitting: Student

## 2024-03-06 ENCOUNTER — Encounter: Payer: Self-pay | Admitting: Student

## 2024-03-06 ENCOUNTER — Other Ambulatory Visit (HOSPITAL_COMMUNITY): Payer: Self-pay

## 2024-03-06 ENCOUNTER — Other Ambulatory Visit: Payer: Self-pay

## 2024-03-06 ENCOUNTER — Ambulatory Visit (INDEPENDENT_AMBULATORY_CARE_PROVIDER_SITE_OTHER): Admitting: Student

## 2024-03-06 VITALS — BP 116/74 | HR 74 | Temp 97.5°F | Ht 71.0 in | Wt 185.0 lb

## 2024-03-06 DIAGNOSIS — Z79899 Other long term (current) drug therapy: Secondary | ICD-10-CM | POA: Diagnosis not present

## 2024-03-06 DIAGNOSIS — L309 Dermatitis, unspecified: Secondary | ICD-10-CM

## 2024-03-06 DIAGNOSIS — I10 Essential (primary) hypertension: Secondary | ICD-10-CM

## 2024-03-06 DIAGNOSIS — B20 Human immunodeficiency virus [HIV] disease: Secondary | ICD-10-CM | POA: Diagnosis not present

## 2024-03-06 DIAGNOSIS — F1910 Other psychoactive substance abuse, uncomplicated: Secondary | ICD-10-CM

## 2024-03-06 MED ORDER — TRIAMCINOLONE ACETONIDE 0.025 % EX OINT
1.0000 | TOPICAL_OINTMENT | Freq: Two times a day (BID) | CUTANEOUS | 2 refills | Status: AC
  Filled 2024-03-06: qty 30, 15d supply, fill #0

## 2024-03-06 MED ORDER — ROSUVASTATIN CALCIUM 5 MG PO TABS
5.0000 mg | ORAL_TABLET | Freq: Every day | ORAL | 11 refills | Status: AC
  Filled 2024-03-06: qty 30, 30d supply, fill #0

## 2024-03-06 MED ORDER — ROSUVASTATIN CALCIUM 5 MG PO TABS
5.0000 mg | ORAL_TABLET | Freq: Every day | ORAL | 11 refills | Status: DC
  Filled 2024-03-06: qty 30, 30d supply, fill #0

## 2024-03-06 NOTE — Progress Notes (Signed)
 "  CC: Chronic condition follow-up  HPI:  [name] is a 64 y.o. male with a past medical history of hypertension, GERD, HIV, alcohol use, tobacco use who presents for follow-up appointment.  Please see assessment and plan for full HPI.  Medications: HIV: Biktarvy  1 tablet daily, rosuvastatin  5 mg daily Eczema: Triamcinolone  ointment   Past Medical History:  Diagnosis Date   Allergy    Chronic hepatitis C without hepatic coma (HCC) 07/10/2006   His hepatitis C was treated and cured.      Folliculitis 06/02/2015   Grief 03/27/2019   HIV (human immunodeficiency virus infection) (HCC)    Hypertension    MSSA bacteremia 08/18/2023   PNEUMOCYSTIS PNEUMONIA 07/10/2006   Qualifier: Diagnosis of  By: Janna MD, Burnard     Pneumothorax    Skin ulcers of foot, bilateral (HCC), R> L, painful 02/05/2023   Spontaneous pneumothorax 04/24/2013    Current Medications[1]  Review of Systems:    Skin: Patient endorses eczema  Physical Exam:  Vitals:   03/06/24 1052  BP: 116/74  Pulse: 74  Temp: (!) 97.5 F (36.4 C)  TempSrc: Oral  SpO2: 96%  Weight: 185 lb (83.9 kg)  Height: 5' 11 (1.803 m)    General: Patient is sitting comfortably in the room  Head: Normocephalic, atraumatic  Cardio: Regular rate and rhythm, no murmurs, rubs or gallops Pulmonary: Clear to ausculation bilaterally with no rales, rhonchi, and crackles  Skin: Eczematous rash noted to bilateral upper extremities   Assessment & Plan:   Assessment & Plan Human immunodeficiency virus (HIV) disease (HCC) Patient has a past medical history of HIV.  He is currently on Biktarvy .  He is adherent to this medication.  Most recent viral load on 08/17/2023 undetectable.  Patient also had CD4 count on 08/16/2023 which showed CD4 count of 288.  Patient has no concerns at this time.  He has recently had his medications refilled by infectious disease.  He has not been on a statin, I recommended this today.  He  recently had his thyroid  checked and his liver enzymes checked.  He is safe to start a statin.  He agreed.  Plan: - Continue Biktarvy  daily - Start rosuvastatin  5 mg daily - Patient to continue to follow with ID - Follow-up CD4 count and HIV viral load - Follow-up in 40-month Primary hypertension Patient has a past medical history of hypertension.  Medications include amlodipine  5 mg daily.  He has not been taking his medication.  Blood pressure today 116/74.  Will discontinue amlodipine  at this time.  I anticipate him stopping his substance use likely may have helped.  Plan: - Stop amlodipine  5 mg daily - Continue to monitor blood pressure Polysubstance abuse (HCC) Patient has a history of polysubstance use.  He states he has been using crack as well as alcohol.  He recently was sent to a treatment center and has been sober for the last 2 months.  He has had no cravings.  He states he has had no stressors.  He is not inclined to take any of the substances anymore.  He wants to turn his life around.  He states the treatment center has helped him with staying sober.  Plan: - Continue to monitor clinical - Offered medications, patient declined at this time Eczema, unspecified type Patient endorses eczema flare.  He has been using hydrocortisone cream.  Not working well.  He request another cream today.  Evaluate him today.  He has a  good amount of eczema noted to his bilateral upper extremities.  Given the extensive distribution, do recommend dermatology referral.  He agreed.  Will start triamcinolone  ointment.  Plan: - Start triamcinolone  ointment - Patient referred to dermatology   Patient discussed with Dr. Jeanelle Libby Blanch, DO Internal Medicine Resident PGY-3     [1]  Current Outpatient Medications:    triamcinolone  (KENALOG ) 0.025 % ointment, Apply 1 Application topically 2 (two) times daily., Disp: 30 g, Rfl: 2   bictegravir-emtricitabine -tenofovir  AF (BIKTARVY ) 50-200-25  MG TABS tablet, Take 1 tablet by mouth daily., Disp: 30 tablet, Rfl: 3   rosuvastatin  (CRESTOR ) 5 MG tablet, Take 1 tablet (5 mg total) by mouth daily., Disp: 30 tablet, Rfl: 11  "

## 2024-03-06 NOTE — Addendum Note (Signed)
 Addended by: ANTONE DWAYNE SAILOR on: 03/06/2024 11:50 AM   Modules accepted: Orders

## 2024-03-06 NOTE — Patient Instructions (Addendum)
 William [name], Thank you for allowing me to take part in your care today.  Here are your instructions.  1.  I have started you on rosuvastatin .  This will lower your cholesterol levels.  2.  I stopped amlodipine .  Your blood pressure measured well today.  3.  Continue to follow with your infectious disease doctor.  I have obtain your viral load today as well as your CD4 count.  4.  Please come back in 3 months for chronic condition follow-up   PLEASE BRING YOUR MEDICATIONS TO EVERY APPOINTMENT  Thank you, Dr. Tobie  If you have any other questions please contact the internal medicine clinic at [phone] If it is after hours, please call the [facility] at [phone] and then ask the person who picks up for the resident on call.

## 2024-03-06 NOTE — Assessment & Plan Note (Signed)
 Patient endorses eczema flare.  He has been using hydrocortisone cream.  Not working well.  He request another cream today.  Evaluate him today.  He has a good amount of eczema noted to his bilateral upper extremities.  Given the extensive distribution, do recommend dermatology referral.  He agreed.  Will start triamcinolone  ointment.  Plan: - Start triamcinolone  ointment - Patient referred to dermatology

## 2024-03-06 NOTE — Assessment & Plan Note (Signed)
 Patient has a past medical history of hypertension.  Medications include amlodipine  5 mg daily.  He has not been taking his medication.  Blood pressure today 116/74.  Will discontinue amlodipine  at this time.  I anticipate him stopping his substance use likely may have helped.  Plan: - Stop amlodipine  5 mg daily - Continue to monitor blood pressure

## 2024-03-06 NOTE — Assessment & Plan Note (Signed)
 Patient has a past medical history of HIV.  He is currently on Biktarvy .  He is adherent to this medication.  Most recent viral load on 08/17/2023 undetectable.  Patient also had CD4 count on 08/16/2023 which showed CD4 count of 288.  Patient has no concerns at this time.  He has recently had his medications refilled by infectious disease.  He has not been on a statin, I recommended this today.  He recently had his thyroid  checked and his liver enzymes checked.  He is safe to start a statin.  He agreed.  Plan: - Continue Biktarvy  daily - Start rosuvastatin  5 mg daily - Patient to continue to follow with ID - Follow-up CD4 count and HIV viral load - Follow-up in 51-month

## 2024-03-06 NOTE — Assessment & Plan Note (Signed)
 Patient has a history of polysubstance use.  He states he has been using crack as well as alcohol.  He recently was sent to a treatment center and has been sober for the last 2 months.  He has had no cravings.  He states he has had no stressors.  He is not inclined to take any of the substances anymore.  He wants to turn his life around.  He states the treatment center has helped him with staying sober.  Plan: - Continue to monitor clinical - Offered medications, patient declined at this time

## 2024-03-07 LAB — T-HELPER CELLS (CD4) COUNT (NOT AT ARMC)
CD4 % Helper T Cell: 23 % — ABNORMAL LOW (ref 33–65)
CD4 T Cell Abs: 370 /uL — ABNORMAL LOW (ref 400–1790)

## 2024-03-07 NOTE — Progress Notes (Signed)
 Internal Medicine Clinic Attending  Case discussed with the resident at the time of the visit.  We reviewed the resident's history and exam and pertinent patient test results.  I agree with the assessment, diagnosis, and plan of care documented in the resident's note.

## 2024-03-08 ENCOUNTER — Ambulatory Visit: Payer: Self-pay | Admitting: Student

## 2024-03-08 LAB — HIV-1 RNA QUANT-NO REFLEX-BLD: HIV-1 RNA Viral Load: <20 {copies}/mL

## 2024-03-25 ENCOUNTER — Ambulatory Visit: Admitting: Physician Assistant

## 2024-04-09 ENCOUNTER — Ambulatory Visit: Admitting: Internal Medicine

## 2024-04-11 ENCOUNTER — Ambulatory Visit: Admitting: Podiatry

## 2024-05-07 ENCOUNTER — Ambulatory Visit: Admitting: Internal Medicine

## 2024-06-04 ENCOUNTER — Ambulatory Visit: Payer: Self-pay
